# Patient Record
Sex: Female | Born: 1960 | Race: White | Hispanic: No | Marital: Married | Smoking: Never smoker
Health system: Southern US, Community
[De-identification: ages and names within clinical notes are randomized; demographics above are authoritative.]

## PROBLEM LIST (undated history)

## (undated) DIAGNOSIS — G4761 Periodic limb movement disorder: Secondary | ICD-10-CM

## (undated) DIAGNOSIS — S82892A Other fracture of left lower leg, initial encounter for closed fracture: Secondary | ICD-10-CM

## (undated) DIAGNOSIS — S82899A Other fracture of unspecified lower leg, initial encounter for closed fracture: Secondary | ICD-10-CM

## (undated) DIAGNOSIS — G43909 Migraine, unspecified, not intractable, without status migrainosus: Secondary | ICD-10-CM

## (undated) DIAGNOSIS — I7300 Raynaud's syndrome without gangrene: Secondary | ICD-10-CM

## (undated) DIAGNOSIS — D841 Defects in the complement system: Secondary | ICD-10-CM

## (undated) DIAGNOSIS — H409 Unspecified glaucoma: Secondary | ICD-10-CM

## (undated) DIAGNOSIS — D8989 Other specified disorders involving the immune mechanism, not elsewhere classified: Secondary | ICD-10-CM

## (undated) DIAGNOSIS — E538 Deficiency of other specified B group vitamins: Secondary | ICD-10-CM

## (undated) DIAGNOSIS — G629 Polyneuropathy, unspecified: Secondary | ICD-10-CM

## (undated) HISTORY — DX: Raynaud's syndrome without gangrene: I73.00

## (undated) HISTORY — DX: Migraine, unspecified, not intractable, without status migrainosus: G43.909

## (undated) HISTORY — DX: Other specified disorders involving the immune mechanism, not elsewhere classified: D89.89

## (undated) HISTORY — PX: KNEE SURGERY: SHX244

## (undated) HISTORY — DX: Periodic limb movement disorder: G47.61

## (undated) HISTORY — PX: SURAL NERVE BIOPSY: SUR153

## (undated) HISTORY — DX: Other fracture of unspecified lower leg, initial encounter for closed fracture: S82.899A

## (undated) HISTORY — DX: Deficiency of other specified B group vitamins: E53.8

## (undated) HISTORY — DX: Polyneuropathy, unspecified: G62.9

## (undated) HISTORY — PX: OTHER SURGICAL HISTORY: SHX169

## (undated) HISTORY — DX: Unspecified glaucoma: H40.9

## (undated) HISTORY — DX: Other fracture of left lower leg, initial encounter for closed fracture: S82.892A

## (undated) HISTORY — DX: Defects in the complement system: D84.1

---

## 1999-06-23 ENCOUNTER — Other Ambulatory Visit: Admission: RE | Admit: 1999-06-23 | Discharge: 1999-06-23 | Payer: Self-pay | Admitting: Gynecology

## 2000-07-05 ENCOUNTER — Other Ambulatory Visit: Admission: RE | Admit: 2000-07-05 | Discharge: 2000-07-05 | Payer: Self-pay | Admitting: Gynecology

## 2001-08-25 ENCOUNTER — Other Ambulatory Visit: Admission: RE | Admit: 2001-08-25 | Discharge: 2001-08-25 | Payer: Self-pay | Admitting: Gynecology

## 2002-07-29 ENCOUNTER — Ambulatory Visit (HOSPITAL_COMMUNITY): Admission: RE | Admit: 2002-07-29 | Discharge: 2002-07-29 | Payer: Self-pay | Admitting: Specialist

## 2002-08-25 ENCOUNTER — Other Ambulatory Visit: Admission: RE | Admit: 2002-08-25 | Discharge: 2002-08-25 | Payer: Self-pay | Admitting: Gynecology

## 2002-09-11 ENCOUNTER — Other Ambulatory Visit: Admission: RE | Admit: 2002-09-11 | Discharge: 2002-09-11 | Payer: Self-pay | Admitting: Gynecology

## 2003-02-26 ENCOUNTER — Other Ambulatory Visit: Admission: RE | Admit: 2003-02-26 | Discharge: 2003-02-26 | Payer: Self-pay | Admitting: Gynecology

## 2003-10-01 ENCOUNTER — Other Ambulatory Visit: Admission: RE | Admit: 2003-10-01 | Discharge: 2003-10-01 | Payer: Self-pay | Admitting: Gynecology

## 2004-04-26 ENCOUNTER — Other Ambulatory Visit: Admission: RE | Admit: 2004-04-26 | Discharge: 2004-04-26 | Payer: Self-pay | Admitting: Gynecology

## 2004-11-01 ENCOUNTER — Other Ambulatory Visit: Admission: RE | Admit: 2004-11-01 | Discharge: 2004-11-01 | Payer: Self-pay | Admitting: Gynecology

## 2005-05-15 ENCOUNTER — Other Ambulatory Visit: Admission: RE | Admit: 2005-05-15 | Discharge: 2005-05-15 | Payer: Self-pay | Admitting: Gynecology

## 2005-10-25 ENCOUNTER — Other Ambulatory Visit: Admission: RE | Admit: 2005-10-25 | Discharge: 2005-10-25 | Payer: Self-pay | Admitting: Gynecology

## 2006-11-04 ENCOUNTER — Other Ambulatory Visit: Admission: RE | Admit: 2006-11-04 | Discharge: 2006-11-04 | Payer: Self-pay | Admitting: Gynecology

## 2008-11-03 ENCOUNTER — Ambulatory Visit (HOSPITAL_COMMUNITY): Admission: RE | Admit: 2008-11-03 | Discharge: 2008-11-03 | Payer: Self-pay | Admitting: Neurosurgery

## 2008-11-03 ENCOUNTER — Encounter (INDEPENDENT_AMBULATORY_CARE_PROVIDER_SITE_OTHER): Payer: Self-pay | Admitting: Neurosurgery

## 2008-11-17 ENCOUNTER — Ambulatory Visit (HOSPITAL_COMMUNITY): Admission: RE | Admit: 2008-11-17 | Discharge: 2008-11-17 | Payer: Self-pay | Admitting: Neurosurgery

## 2008-11-17 ENCOUNTER — Ambulatory Visit: Payer: Self-pay | Admitting: Vascular Surgery

## 2008-11-17 ENCOUNTER — Encounter (INDEPENDENT_AMBULATORY_CARE_PROVIDER_SITE_OTHER): Payer: Self-pay | Admitting: Neurosurgery

## 2008-12-13 ENCOUNTER — Encounter: Admission: RE | Admit: 2008-12-13 | Discharge: 2008-12-13 | Payer: Self-pay | Admitting: Neurology

## 2009-02-25 ENCOUNTER — Ambulatory Visit: Payer: Self-pay | Admitting: Vascular Surgery

## 2009-04-23 IMAGING — CR DG CHEST 2V
3 series · 3 of 3 positions shown · non-contrast
Comparison: None.

CLINICAL DATA: Vasculitis.  Idiopathic peripheral neuropathy.

CHEST - 2 VIEW

[view not recorded (1 of 3)]
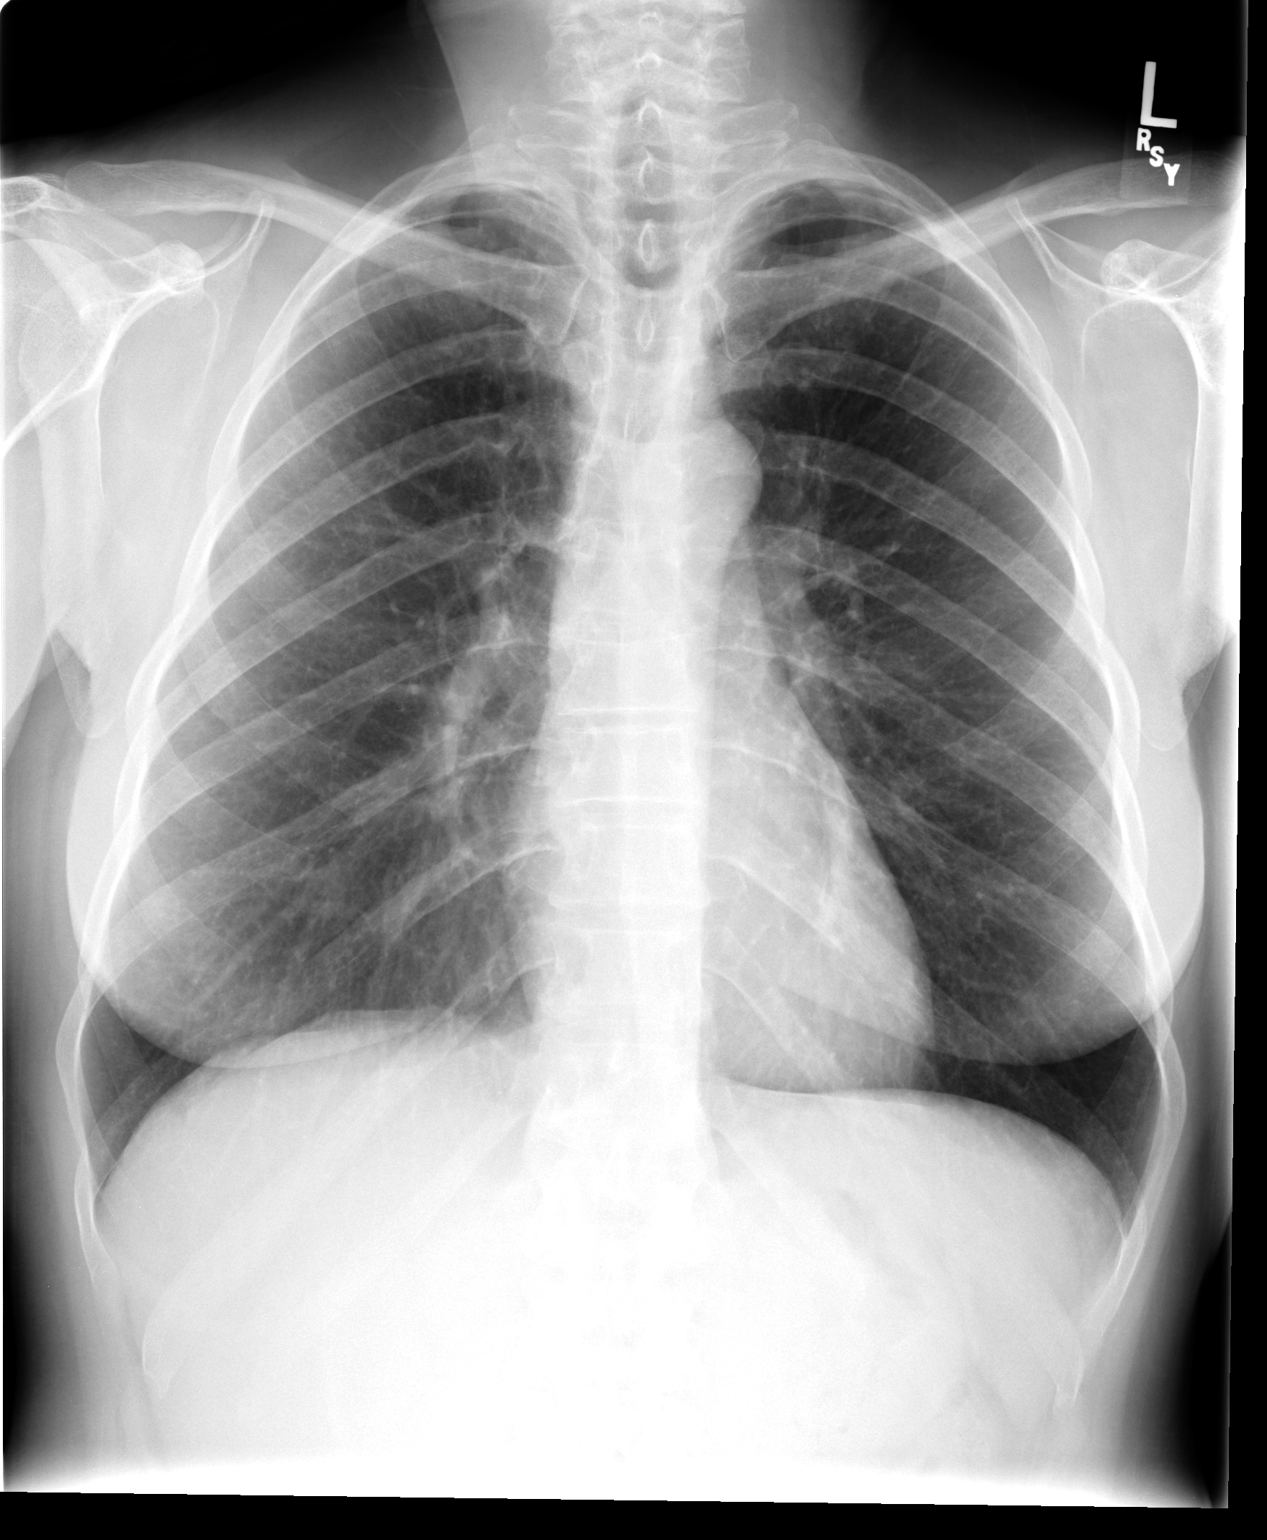

[view not recorded (2 of 3)]
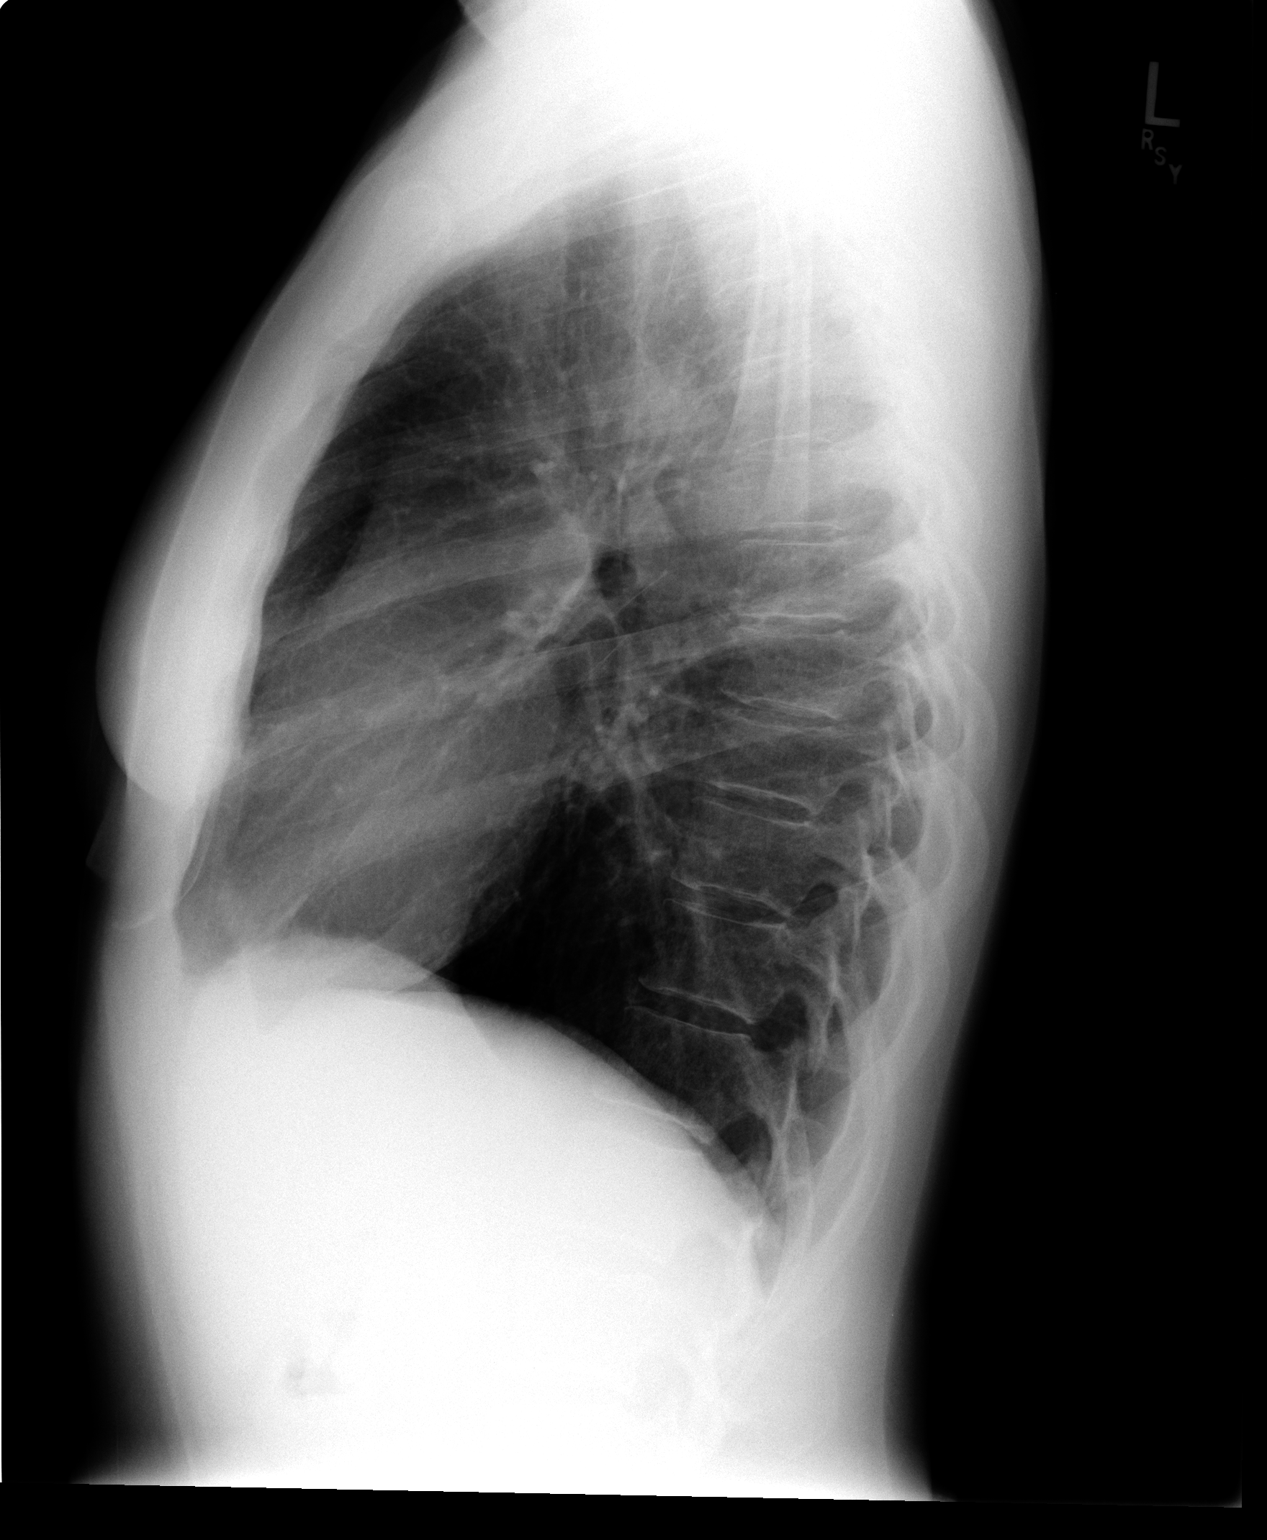

[view not recorded (3 of 3)]
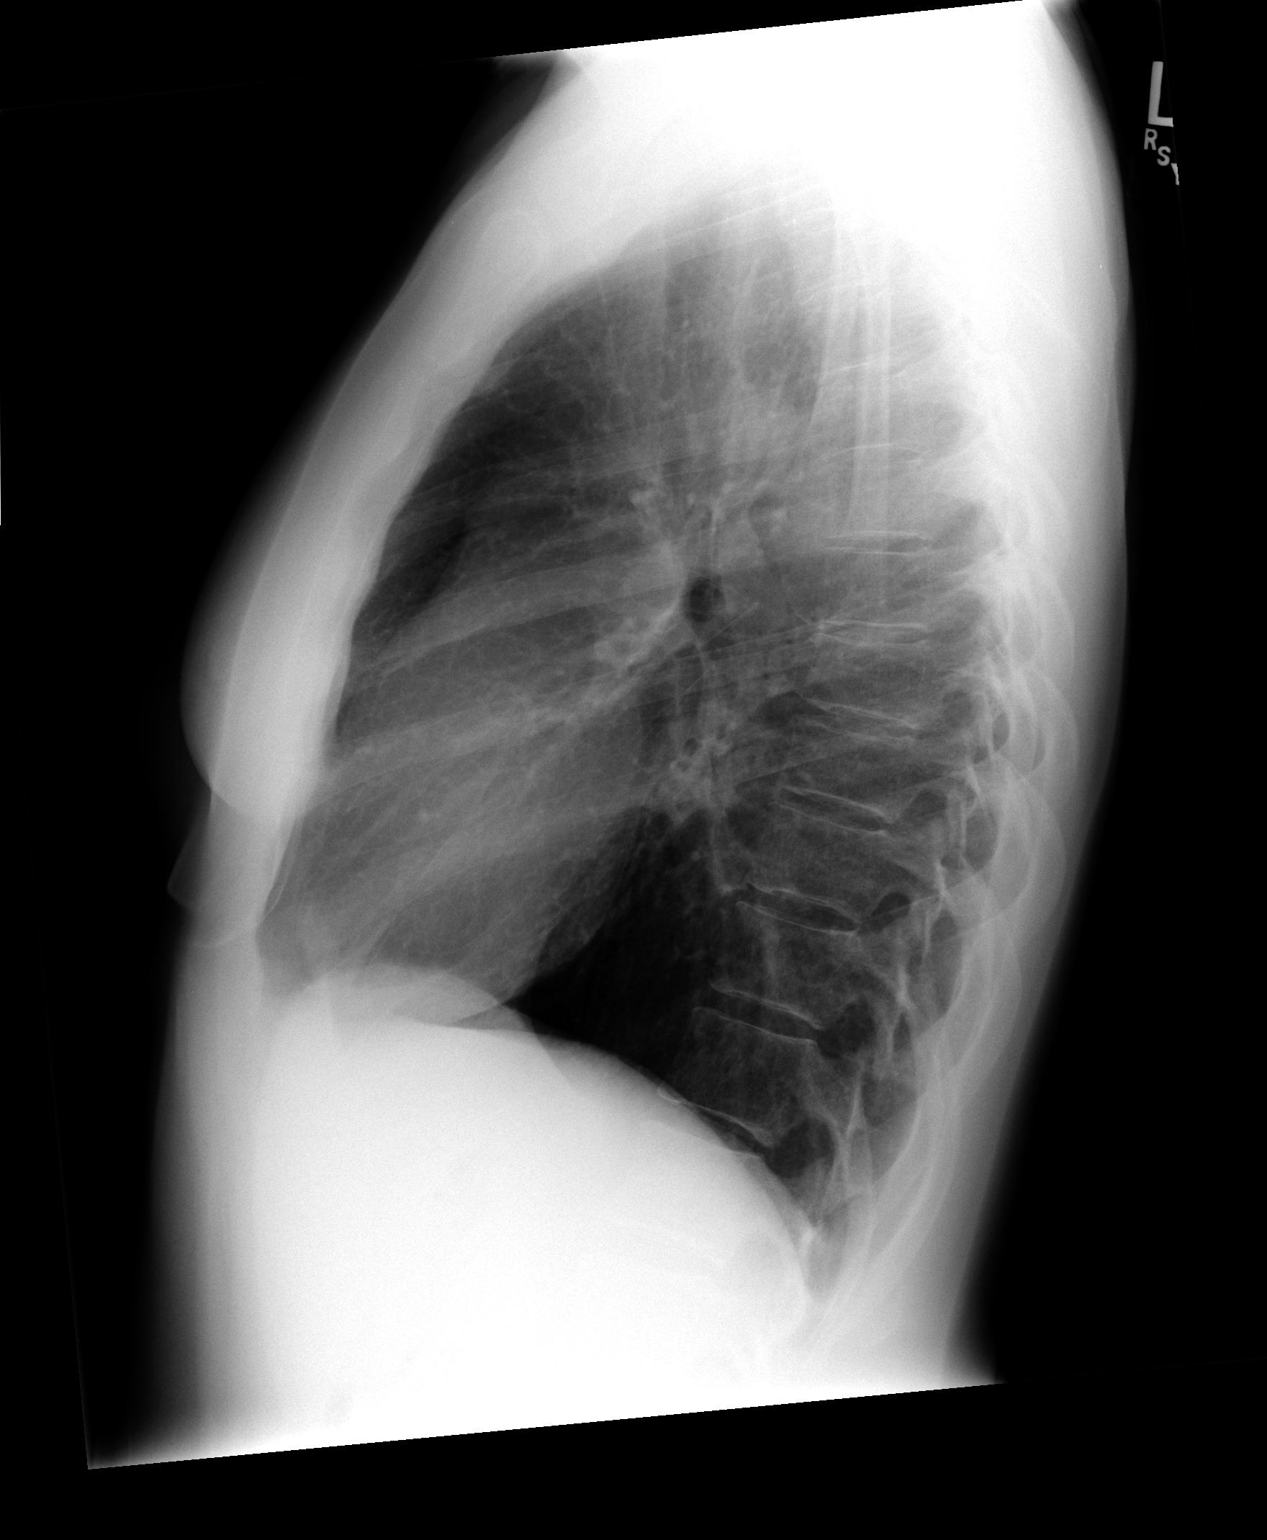

[3 of 3 positions shown; findings below may reference images not displayed]

FINDINGS: The lungs are clear without focal consolidation, edema,
effusion or pneumothorax.  Cardiopericardial silhouette is within
normal limits for size.  Imaged bony structures of the thorax are
intact.
IMPRESSION: No acute cardiopulmonary process.

## 2010-09-15 ENCOUNTER — Encounter: Payer: Self-pay | Admitting: Internal Medicine

## 2010-10-05 NOTE — Letter (Signed)
Summary: Annual Exam-Dr. Beather Arbour  Annual Exam-Dr. Beather Arbour   Imported By: Maryln Gottron 09/28/2010 10:49:33  _____________________________________________________________________  External Attachment:    Type:   Image     Comment:   External Document

## 2010-12-19 LAB — BASIC METABOLIC PANEL
BUN: 12 mg/dL (ref 6–23)
CO2: 26 mEq/L (ref 19–32)
Calcium: 9.1 mg/dL (ref 8.4–10.5)
Chloride: 108 mEq/L (ref 96–112)
Creatinine, Ser: 0.7 mg/dL (ref 0.4–1.2)
GFR calc Af Amer: >60 mL/min (ref >60)
GFR calc non Af Amer: >60 mL/min (ref >60)
Glucose, Bld: 98 mg/dL (ref 70–99)
Potassium: 4.5 mEq/L (ref 3.5–5.1)
Sodium: 137 mEq/L (ref 135–145)

## 2010-12-19 LAB — CBC
HCT: 39.6 % (ref 36.0–46.0)
Hemoglobin: 13.5 g/dL (ref 12.0–15.0)
MCHC: 34.1 g/dL (ref 30.0–36.0)
MCV: 91.6 fL (ref 78.0–100.0)
Platelets: 332 10*3/uL (ref 150–400)
RBC: 4.32 MIL/uL (ref 3.87–5.11)
RDW: 12.8 % (ref 11.5–15.5)
WBC: 8.1 10*3/uL (ref 4.0–10.5)

## 2011-01-16 NOTE — Op Note (Signed)
[name], [name]               ACCOUNT NO.:  [id number]   MEDICAL RECORD NO.:  [id number]          PATIENT TYPE:  OIB   LOCATION:  3523                         FACILITY:  [facility]   PHYSICIAN:  Hewitt Shorts, M.D.DATE OF BIRTH:  16-Jan-1961   DATE OF PROCEDURE:  11/03/2008  DATE OF DISCHARGE:  11/03/2008                               OPERATIVE REPORT   PREOPERATIVE DIAGNOSIS:  Peripheral neuropathy.   POSTOPERATIVE DIAGNOSIS:  Peripheral neuropathy.   PROCEDURE:  1. Left sural nerve biopsy.  2. Left gastrocnemius muscle biopsy.   SURGEON:  Hewitt Shorts, MD   ANESTHESIA:  General endotracheal.   INDICATIONS:  The patient is a 50 year old woman who has been undergoing  a workup for peripheral neuropathy of unknown etiology.  She has  undergone an extensive workup with Dr. Avie Echevaria who has requested a  nerve and muscle biopsy.  The tissue specimens are to be forwarded to  the Pathology lab here at [facility], to be then forwarded on  to the Nerve and Muscle Pathology lab at [facility].   PROCEDURE:  The patient was brought to the operating room and placed  under general endotracheal anesthesia.  The patient was turned to a  prone position.  The left lower extremity from the distal thigh, distal  end of the foot was prepped with Betadine soap and solution, and draped  in a sterile fashion.  We first performed the left sural nerve biopsy  making an approximately 6-cm incision on the posterolateral aspect of  the distal left leg between the lateral malleolus and Achilles tendon.  The line was infiltrated with local anesthetic with epinephrine.  Dissection was carried down through the subcutaneous tissue.  We  identified the short saphenous vein, and began to open the subcutaneous  and connective tissues and identified the sural nerve and began to free  the sural nerve up from its small branches and a 5-cm length specimen  was requested and  therefore, we dissected out approximately 5 cm of the  nerve.  Once it was freed up, we incised the nerve distally with an 11  scalpel and then subsequently we incised the nerve proximally.  The  nerve was wrapped in a saline-soaked gauze sponge, placed in a specimen  cup and the specimen was hand carried to the Pathology Laboratory for  further processing.  The wound was irrigated with bacitracin solution,  good hemostasis was maintained throughout the procedure with bipolar  cautery and then we proceeded with closure.  The subcutaneous and  subcuticular were closed with interrupted inverted 3-0 undyed Vicryl  sutures.  The skin was closed with Dermabond.   We then proceeded with the left gastrocnemius muscle biopsy.  Again the  overlying skin and subcutaneous tissue were infiltrated with local  anesthetic with epinephrine and incision was made, carried down through  the subcutaneous tissue.  Dissection was carried down to the fascia  overlying the muscle and then the fascia was carefully opened to avoid  the neurovascular structures.  We were able to expose the muscle itself  and again a  2.5-cm length of muscle was requested, we dissected  approximately 3 cm of muscle, and they requested strips of muscle  approximately 0.5 cm in diameter and these were carefully dissected from  the gastrocnemius muscle, they were sutured to a tongue blade with 0  silk ties, 3 specimens were obtained.  Hemostasis was maintained with  bipolar cautery.  The muscle spasms were each individually wrapped in a  saline-soaked gauze sponge.  All 3 were placed in the same specimen cup  and sent to Pathology for further processing.  The wound was checked for  hemostasis, which was established with bipolar cautery.  The fascia  overlying the muscle was closed with interrupted 2-0 undyed Vicryl  sutures and the subcutaneous and subcuticular were closed with  interrupted inverted 3-0 undyed Vicryl sutures.  Again the  skin was  closed with Dermabond.  The procedure was tolerated well and following  the procedure, the patient is to be turned back to the supine position,  reversing the anesthetic, extubated, and transferred to the recovery  room for further care.      Hewitt Shorts, M.D.  Electronically Signed     RWN/MEDQ  D:  11/03/2008  T:  11/03/2008  Job:  [id number]

## 2011-01-16 NOTE — Procedures (Signed)
DUPLEX DEEP VENOUS EXAM - LOWER EXTREMITY   INDICATION:  Right leg edema 10 days.   HISTORY:  Edema:  Right leg and foot.  Trauma/Surgery:  No.  Pain:  Chronic bilateral foot pain.  PE:  No.  Previous DVT:  Questionable history of DVT.  Anticoagulants:  No.  Other:  Bilateral varicose veins.   DUPLEX EXAM:                CFV   SFV   PopV  PTV    GSV                R  L  R  L  R  L  R   L  R  L  Thrombosis    0  0  0     0     0      0  Spontaneous   0  +  +     +     +      +  Phasic        +  +  +     +     +      +  Augmentation  +  +  +     +     +      +  Compressible  +  +  +     +     +      +  Competent     0  0  +     +     +      +   Legend:  + - yes  o - no  p - partial  D - decreased    IMPRESSION:  1. No evidence of right leg DVT or Baker's cyst.  2. Left common femoral vein demonstrates incompetence.  3. 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  The      right short saphenous vein is partially compressible.         _____________________________  Quita Skye. Hart Rochester, M.D.   MC/MEDQ  D:  02/25/2009  T:  02/25/2009  Job:  [id number]   cc:   Genene Churn. Love, M.D.  Lurena Nida, MD

## 2011-01-19 NOTE — Op Note (Signed)
NAMEKAYSIE, [name]                         ACCOUNT NO.:  [id number]   MEDICAL RECORD NO.:  [id number]                   PATIENT TYPE:  AMB   LOCATION:  DAY                                  FACILITY:  [facility]   PHYSICIAN:  Jene Every, M.D.                 DATE OF BIRTH:  1961/03/16   DATE OF PROCEDURE:  DATE OF DISCHARGE:                                 OPERATIVE REPORT   PREOPERATIVE DIAGNOSIS:  Medial meniscus tear of the left knee.   POSTOPERATIVE DIAGNOSES:  1. Medial meniscus tear of the left knee.  2. 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   PROCEDURE:  Left knee arthroscopy, partial medial meniscectomy,  chondroplasty of the medial femoral condyle and tibial plateau.   BRIEF HISTORY AND INDICATIONS:  A 50 year old with mechanical locking and  popping of the left knee, MRI indicating displaced complex tear of the  posterior horn of the medial meniscus.  Operative intervention was indicated  for partial resection.  Risks have been discussed, including bleeding,  infection, damage to vascular structures, no change in symptoms, need for  repeat debridement.   DESCRIPTION OF PROCEDURE:  Patient in supine position.  After induction of  adequate general anesthesia and 1 g Kefzol, the left lower extremity was  prepped and draped in the usual sterile fashion.  A lateral parapatellar  portal and superior medial parapatellar portal were fashioned with a #11  blade, the ingress cannula atraumatically placed.  The cannula was utilized  to insufflate the joint.  Under direct visualization, a medial parapatellar  portal was fashioned with a #11 blade after localization with the 18-gauge  needle, sparing the medial meniscus.  Inspection of the suprapatellar pouch  reveals minor grade 3 changes of the inferior pole of the patella, normal  patellofemoral tracking.  The shaver was introduced and utilized to perform  a chondroplasty of the  flap tear here.  Examination of the medial  compartment revealed a complex tear of the posterior horn of the medial  meniscus, horizontal and vertical components, extending from the posterior  horn to the anterior medial aspect of the meniscus.  This was unstable to  probe palpation.  Therefore, I introduced a combination of the straight and  angled basket rongeurs to resect it to a stable base, which was then further  contoured by a shaver.  This actually had torn back to the vascular  junction; therefore, electrocautery was introduced and utilized to achieve  hemostasis at this point.  Reduced pressure of the tourniquet, and there was  no significant active bleeding.  I performed chondroplasty of the medial  femoral condyle and tibial plateau that was seen on the MRI.  The remainder  of the meniscus was stable to probe palpation, but at least 70% of that  posterior aspect up to the anteromedial aspect had  to be resected.  The  remainder was stable to probe palpation.  The ACL and PCL were unremarkable.  The lateral compartment revealed normal femoral condyle, tibial plateau, and  meniscus, which was stable to probe palpation and no evidence of a tear  here.  We copiously lavaged all compartments.  The gutters were examined,  and they were unremarkable.  We probed the meniscus, and no residual  unstable fragment was noted.  Lavaged the knee.  No evidence of active  bleeding.  Removed the instrumentation, closed with portals of 4-0 nylon  simple suture, 0.25% Marcaine with epinephrine was  infiltrated into the joint, and the wounds were dressed sterilely and the  patient was awoken without difficulty, transported to the recovery room in  satisfactory condition.   The patient tolerated the procedure well with no complication.                                               Jene Every, M.D.    Cordelia Pen  D:  07/29/2002  T:  07/29/2002  Job:  [id number]

## 2011-10-15 ENCOUNTER — Other Ambulatory Visit: Payer: Self-pay | Admitting: Gynecology

## 2011-11-02 DIAGNOSIS — I7300 Raynaud's syndrome without gangrene: Secondary | ICD-10-CM | POA: Insufficient documentation

## 2011-11-02 DIAGNOSIS — G608 Other hereditary and idiopathic neuropathies: Secondary | ICD-10-CM | POA: Insufficient documentation

## 2012-10-03 DIAGNOSIS — E538 Deficiency of other specified B group vitamins: Secondary | ICD-10-CM | POA: Insufficient documentation

## 2012-10-03 DIAGNOSIS — R209 Unspecified disturbances of skin sensation: Secondary | ICD-10-CM | POA: Insufficient documentation

## 2012-10-03 DIAGNOSIS — H9319 Tinnitus, unspecified ear: Secondary | ICD-10-CM | POA: Insufficient documentation

## 2012-10-03 DIAGNOSIS — H16229 Keratoconjunctivitis sicca, not specified as Sjogren's, unspecified eye: Secondary | ICD-10-CM | POA: Insufficient documentation

## 2012-10-03 DIAGNOSIS — G609 Hereditary and idiopathic neuropathy, unspecified: Secondary | ICD-10-CM | POA: Insufficient documentation

## 2012-10-17 ENCOUNTER — Other Ambulatory Visit: Payer: Self-pay | Admitting: Gynecology

## 2012-11-05 ENCOUNTER — Other Ambulatory Visit: Payer: Self-pay | Admitting: Gynecology

## 2012-11-05 DIAGNOSIS — Z803 Family history of malignant neoplasm of breast: Secondary | ICD-10-CM

## 2012-11-05 DIAGNOSIS — E2839 Other primary ovarian failure: Secondary | ICD-10-CM

## 2012-11-05 DIAGNOSIS — Z8781 Personal history of (healed) traumatic fracture: Secondary | ICD-10-CM

## 2012-11-05 DIAGNOSIS — Z1231 Encounter for screening mammogram for malignant neoplasm of breast: Secondary | ICD-10-CM

## 2012-12-12 ENCOUNTER — Ambulatory Visit: Payer: Self-pay

## 2012-12-12 ENCOUNTER — Other Ambulatory Visit: Payer: Self-pay

## 2012-12-25 ENCOUNTER — Other Ambulatory Visit: Payer: Self-pay | Admitting: Gynecology

## 2012-12-25 DIAGNOSIS — Z8781 Personal history of (healed) traumatic fracture: Secondary | ICD-10-CM

## 2012-12-25 DIAGNOSIS — Z1231 Encounter for screening mammogram for malignant neoplasm of breast: Secondary | ICD-10-CM

## 2012-12-25 DIAGNOSIS — E2839 Other primary ovarian failure: Secondary | ICD-10-CM

## 2012-12-25 DIAGNOSIS — Z803 Family history of malignant neoplasm of breast: Secondary | ICD-10-CM

## 2012-12-26 ENCOUNTER — Other Ambulatory Visit: Payer: Self-pay

## 2012-12-26 MED ORDER — DULOXETINE HCL 60 MG PO CPEP: 60.0000 mg | ORAL_CAPSULE | Freq: Every day | ORAL | Status: DC

## 2012-12-26 NOTE — Telephone Encounter (Signed)
Former [facility] Patient assigned to Dr Anne Hahn.

## 2013-01-02 ENCOUNTER — Ambulatory Visit
Admission: RE | Admit: 2013-01-02 | Discharge: 2013-01-02 | Disposition: A | Discharge status: Home / Self Care | Payer: 59 | Source: Ambulatory Visit | Attending: Gynecology | Admitting: Gynecology

## 2013-01-02 ENCOUNTER — Other Ambulatory Visit: Payer: Self-pay | Admitting: Obstetrics and Gynecology

## 2013-01-02 ENCOUNTER — Ambulatory Visit: Payer: Self-pay

## 2013-01-02 DIAGNOSIS — Z1231 Encounter for screening mammogram for malignant neoplasm of breast: Secondary | ICD-10-CM

## 2013-01-02 DIAGNOSIS — E2839 Other primary ovarian failure: Secondary | ICD-10-CM

## 2013-01-02 DIAGNOSIS — Z8781 Personal history of (healed) traumatic fracture: Secondary | ICD-10-CM

## 2013-01-02 DIAGNOSIS — Z803 Family history of malignant neoplasm of breast: Secondary | ICD-10-CM

## 2013-01-18 ENCOUNTER — Other Ambulatory Visit: Payer: Self-pay

## 2013-01-18 MED ORDER — GABAPENTIN 300 MG PO CAPS: 300.0000 mg | ORAL_CAPSULE | Freq: Three times a day (TID) | ORAL | Status: DC

## 2013-02-18 ENCOUNTER — Telehealth: Payer: Self-pay

## 2013-02-18 NOTE — Telephone Encounter (Signed)
I called patient at work as requested. Their office is closed. I called her at home and was instructed by a female who answered the phone to call back later.

## 2013-02-18 NOTE — Telephone Encounter (Signed)
Message copied by [facility] on Wed Feb 18, 2013  3:54 PM ------      Message from: Richrd Prime      Created: Tue Feb 17, 2013 11:52 AM      Regarding: B12 Injections FDA states nationwide shortage      Contact: [phone]       Patient is calling to tell us she spoke to her Pharmacy.  They told her there is a FDA announced shortage of B12 injectables.  She would like a call back to discuss.  She is Dr. Clarisa Kindred patient.  She can be called at work today [phone] ------

## 2013-04-24 ENCOUNTER — Encounter: Payer: Self-pay | Admitting: Neurology

## 2013-04-24 ENCOUNTER — Other Ambulatory Visit: Payer: Self-pay | Admitting: Neurology

## 2013-04-24 ENCOUNTER — Ambulatory Visit (INDEPENDENT_AMBULATORY_CARE_PROVIDER_SITE_OTHER): Payer: 59 | Admitting: Neurology

## 2013-04-24 VITALS — BP 127/89 | HR 80 | Ht 65.0 in | Wt 180.0 lb

## 2013-04-24 DIAGNOSIS — G609 Hereditary and idiopathic neuropathy, unspecified: Secondary | ICD-10-CM

## 2013-04-24 DIAGNOSIS — I7300 Raynaud's syndrome without gangrene: Secondary | ICD-10-CM

## 2013-04-24 DIAGNOSIS — G43009 Migraine without aura, not intractable, without status migrainosus: Secondary | ICD-10-CM

## 2013-04-24 DIAGNOSIS — R209 Unspecified disturbances of skin sensation: Secondary | ICD-10-CM

## 2013-04-24 DIAGNOSIS — H9319 Tinnitus, unspecified ear: Secondary | ICD-10-CM

## 2013-04-24 DIAGNOSIS — Z5181 Encounter for therapeutic drug level monitoring: Secondary | ICD-10-CM

## 2013-04-24 DIAGNOSIS — E538 Deficiency of other specified B group vitamins: Secondary | ICD-10-CM

## 2013-04-24 HISTORY — DX: Raynaud's syndrome without gangrene: I73.00

## 2013-04-24 NOTE — Progress Notes (Signed)
Reason for visit: Peripheral neuropathy  [name] is an 52 y.o. female  History of present illness:  [name] is a 52 year old right-handed white female with a history of a peripheral neuropathy with onset of symptoms around 2008. The patient was felt to have vasculitis, possibly associated with Sjogren's syndrome. The patient is followed by Dr. Nickola Major as well. The patient has been on Cymbalta and gabapentin without complete pain relief. The patient is able to cope with the pain during the day, but she has significant issues at nighttime. The patient has had a muscle biopsy previously that shows evidence of a vasculitis. The patient has Raynaulds phenomenon. The patient has a low B12 level, and she is getting monthly injections. The patient returns to this office for further evaluation. The patient denies any significant balance issues. The patient has a sensation in the feet as if something is squeezing her feet and toes.  Past Medical History  Diagnosis Date  . Migraines   . Myeloperoxidase deficiency   . Raynaud disease 04/24/2013  . Vitamin B12 deficiency   . Ankle fracture   . Peripheral neuropathy     Right    Past Surgical History  Procedure Laterality Date  . Dental implants      x3  . Knee surgery Left   . Sural nerve biopsy    . Gastrocsoleus biopsy    . Lip biopsy      Family History  Problem Relation Age of Onset  . Migraines Mother   . Cancer Mother   . Aneurysm Father   . Parkinsonism Maternal Grandfather   . Cancer Maternal Grandfather   . Heart disease Maternal Grandfather   . Heart disease Paternal Grandfather   . Stroke Paternal Grandfather     Social history:  reports that she has never smoked. She does not have any smokeless tobacco history on file. She reports that she does not drink alcohol or use illicit drugs.    Allergies  Allergen Reactions  . Doxycycline Hyclate   . Lyrica [Pregabalin]   . Vivelle-Dot [Estradiol]   . Ibuprofen      Medications:  Current Outpatient Prescriptions on File Prior to Visit  Medication Sig Dispense Refill  . DULoxetine (CYMBALTA) 60 MG capsule Take 1 capsule (60 mg total) by mouth daily.  30 capsule  6  . gabapentin (NEURONTIN) 300 MG capsule Take 1 capsule (300 mg total) by mouth 3 (three) times daily.  90 capsule  5   No current facility-administered medications on file prior to visit.    ROS:  Out of a complete 14 system review of symptoms, the patient complains only of the following symptoms, and all other reviewed systems are negative.  Weight gain, fatigue Swelling in the legs Ringing in the ears Cough Easy bruising Increased thirst Numbness Insomnia, restless legs  Blood pressure 127/89, pulse 80, height 5\' 5"  (1.651 m), weight 180 lb (81.647 kg).  Physical Exam  General: The patient is alert and cooperative at the time of the examination.  Skin:  1-2+ edema on the right ankle is noted, 1+ edema on the left.   Neurologic Exam  Cranial nerves: Facial symmetry is present. Speech is normal, no aphasia or dysarthria is noted. Extraocular movements are full. Visual fields are full.  Motor: The patient has good strength in all 4 extremities.  Coordination: The patient has good finger-nose-finger and heel-to-shin bilaterally.  Gait and station: The patient has a normal gait. Tandem gait is slightly  and study. Romberg is negative. No drift is seen.  Reflexes: Deep tendon reflexes are symmetric. Reflexes in the lower extremities are somewhat brisk, and there is a slightly more prominent reflex on the right ankle jerk than in the left.   Assessment/Plan:  1. Peripheral neuropathy  2. Vitamin B12 deficiency  The patient will be given a trial on Gralise to see if the extended-release preparation of gabapentin may help improve her symptoms at night. The patient was given samples, and if she finds that this is helpful, she will contact our office. In the future, the  Cymbalta can also be increased, but the patient indicates that if she takes it at night, this causes vivid dreams, and she is unable to rest well. The patient otherwise will followup in 6 months. Blood work will be done today. The patient reports a recent increase in weight gain, and a dry hacking cough.  Marlan Palau MD 04/25/2013 8:49 AM  [facility] [address] [place], [place] [address]  Phone [phone] Fax [phone]

## 2013-05-01 ENCOUNTER — Encounter: Payer: Self-pay | Admitting: Neurology

## 2013-05-05 ENCOUNTER — Encounter: Payer: Self-pay | Admitting: Neurology

## 2013-05-05 LAB — TSH: TSH: 10.76 u[IU]/mL — ABNORMAL HIGH (ref 0.450–4.500)

## 2013-05-05 LAB — CBC WITH DIFFERENTIAL
Basophils Absolute: 0.1 10*3/uL (ref 0.0–0.2)
Basos: 1 % (ref 0–3)
Eosinophils Absolute: 0.2 10*3/uL (ref 0.0–0.4)
Hemoglobin: 12.6 g/dL (ref 11.1–15.9)
Immature Grans (Abs): 0 10*3/uL (ref 0.0–0.1)
Lymphs: 28 % (ref 14–46)
MCHC: 32.8 g/dL (ref 31.5–35.7)
Monocytes: 11 % (ref 4–12)
Neutrophils Relative %: 57 % (ref 40–74)
RDW: 13.9 % (ref 12.3–15.4)
WBC: 7.6 10*3/uL (ref 3.4–10.8)

## 2013-05-05 LAB — COMPREHENSIVE METABOLIC PANEL
ALT: 22 IU/L (ref 0–32)
CO2: 25 mmol/L (ref 18–29)
Calcium: 9.3 mg/dL (ref 8.7–10.2)
Chloride: 106 mmol/L (ref 97–108)
Glucose: 94 mg/dL (ref 65–99)
Potassium: 4.5 mmol/L (ref 3.5–5.2)
Total Protein: 6.5 g/dL (ref 6.0–8.5)

## 2013-05-05 LAB — PLEASE NOTE

## 2013-05-07 ENCOUNTER — Telehealth: Payer: Self-pay | Admitting: Neurology

## 2013-05-07 ENCOUNTER — Telehealth: Payer: Self-pay | Admitting: *Deleted

## 2013-05-07 NOTE — Telephone Encounter (Signed)
error 

## 2013-05-07 NOTE — Telephone Encounter (Signed)
I called the patient and I talked with her husband. The blood work is unremarkable, with the exception that the TSH is around 10. This will need to be followed, and likely be treated in the future. I will send the blood work results to her primary care physician.

## 2013-05-07 NOTE — Telephone Encounter (Signed)
Went over Dr. Anne Hahn note and results with patient. She said her spouse did not get the full understanding the first time.

## 2013-05-07 NOTE — Telephone Encounter (Signed)
Dr. Anne Hahn gave pts husband results.

## 2013-05-12 ENCOUNTER — Encounter: Payer: Self-pay | Admitting: Neurology

## 2013-05-12 ENCOUNTER — Telehealth: Payer: Self-pay

## 2013-05-12 MED ORDER — GABAPENTIN (ONCE-DAILY) 600 MG PO TABS: 600.0000 mg | ORAL_TABLET | Freq: Every day | ORAL | Status: DC

## 2013-05-12 NOTE — Telephone Encounter (Signed)
I called the patient and I talked with the husband. The patient is on gabapentin taking 300 mg 3 times daily. It appears that she may want to stay on this as well as go on Gralise 600 mg in the evening. I will call in a Gralise. The patient overall is on a relatively low dose of gabapentin.

## 2013-05-12 NOTE — Telephone Encounter (Signed)
Patient sent an email to the clinic pool.  Says she has been able to function fairly well on just the 600mg  Gralise and supplementing it with 300mg  Gabapentin when her pain is severe.  Indicates she has plenty of Gabapentin, but would like a new Rx sent to [facility] for Gralise.  She would like to remain on both meds.  Please advise.  Thank you.

## 2013-05-18 ENCOUNTER — Telehealth: Payer: Self-pay | Admitting: Neurology

## 2013-05-18 NOTE — Telephone Encounter (Signed)
I have placed samples at the front desk.  Dispensed Gralise Starter Kit #1 Lot [id number] Exp 07/2015.  I called the patient.  She is aware samples are ready for pick up.

## 2013-05-28 ENCOUNTER — Other Ambulatory Visit: Payer: Self-pay

## 2013-05-28 ENCOUNTER — Telehealth: Payer: Self-pay

## 2013-05-28 MED ORDER — DULOXETINE HCL 60 MG PO CPEP: 60.0000 mg | ORAL_CAPSULE | Freq: Two times a day (BID) | ORAL | Status: DC

## 2013-05-28 MED ORDER — HYDROCODONE-ACETAMINOPHEN 5-300 MG PO TABS: 1.0000 | ORAL_TABLET | Freq: Four times a day (QID) | ORAL | Status: DC | PRN

## 2013-05-28 MED ORDER — GABAPENTIN 300 MG PO CAPS: 600.0000 mg | ORAL_CAPSULE | Freq: Every day | ORAL | Status: DC

## 2013-05-28 NOTE — Telephone Encounter (Signed)
Former [facility] patient.  He always prescribed #120 per fill.

## 2013-05-28 NOTE — Telephone Encounter (Signed)
Patient's insurance has mailed a correspondence stating they will not cover Gralise as it is excluded from the plans formulary.  The only alternative listed was regular Gabapentin, which the patient has already tried.  No exception will be granted unless her formulary changes in the future.   (There is not currently an assistance program for this drug.)

## 2013-05-28 NOTE — Telephone Encounter (Signed)
Rx signed and faxed.

## 2013-05-28 NOTE — Telephone Encounter (Signed)
I called the patient. The Gralise is not covered by her insurance. What we will do is go to Cymbalta 60 mg twice daily, and gabapentin 600 mg at night.

## 2013-06-05 ENCOUNTER — Telehealth: Payer: Self-pay

## 2013-06-05 MED ORDER — HYDROCODONE-ACETAMINOPHEN 5-325 MG PO TABS: 1.0000 | ORAL_TABLET | Freq: Four times a day (QID) | ORAL | Status: DC | PRN

## 2013-06-05 NOTE — Telephone Encounter (Signed)
I will switch the prescription on the hydrocodone. I called patient. The recent prescription for hydrocodone written on 05/28/2013 could not be filled due to insurance denial. I'll rewrite the prescription.

## 2013-06-05 NOTE — Telephone Encounter (Signed)
Patient called stating her insurance will no longer cover Hydrocodone 5/300mg  and she would like to know if her Rx can be changed to 5/325mg , because they will pay for that dose.  Please advise.  Thank you.

## 2013-06-17 ENCOUNTER — Telehealth: Payer: Self-pay

## 2013-06-17 NOTE — Telephone Encounter (Signed)
Patient called, left message saying Cymbalta needs a Prior Auth.  I have submitted all requested info to the ins, called patient, got no answer.  Left message.

## 2013-07-09 ENCOUNTER — Other Ambulatory Visit: Payer: Self-pay

## 2013-07-22 ENCOUNTER — Telehealth: Payer: Self-pay | Admitting: Neurology

## 2013-07-22 NOTE — Telephone Encounter (Signed)
Called for more information ,no answer, lt VM message

## 2013-07-22 NOTE — Telephone Encounter (Signed)
I called the patient. The patient has developed a rash on the chest and back, typical for an drug rash. The patient however, has been on gabapentin and Cymbalta for number of months, he would be unlikely for this to be an allergy to this medication. The patient will taper off of the Cymbalta, and if the rash does not go away, she will need to see a dermatologist. The patient reports no other new medications.

## 2013-11-13 ENCOUNTER — Encounter: Payer: Self-pay | Admitting: Neurology

## 2013-11-13 ENCOUNTER — Ambulatory Visit (INDEPENDENT_AMBULATORY_CARE_PROVIDER_SITE_OTHER): Payer: 59 | Admitting: Neurology

## 2013-11-13 VITALS — BP 124/83 | HR 70 | Wt 173.0 lb

## 2013-11-13 DIAGNOSIS — G609 Hereditary and idiopathic neuropathy, unspecified: Secondary | ICD-10-CM

## 2013-11-13 DIAGNOSIS — G43009 Migraine without aura, not intractable, without status migrainosus: Secondary | ICD-10-CM

## 2013-11-13 MED ORDER — HYDROCODONE-ACETAMINOPHEN 5-325 MG PO TABS: 1.0000 | ORAL_TABLET | Freq: Four times a day (QID) | ORAL | Status: DC | PRN

## 2013-11-13 MED ORDER — CYANOCOBALAMIN 1000 MCG/ML IJ SOLN: 1000.0000 ug | INTRAMUSCULAR | Status: DC

## 2013-11-13 NOTE — Patient Instructions (Signed)
Paresthesia °Paresthesia is an abnormal burning or prickling sensation. This sensation is generally felt in the hands, arms, legs, or feet. However, it may occur in any part of the body. It is usually not painful. The feeling may be described as: °· Tingling or numbness. °· "Pins and needles." °· Skin crawling. °· Buzzing. °· Limbs "falling asleep." °· Itching. °Most people experience temporary (transient) paresthesia at some time in their lives. °CAUSES  °Paresthesia may occur when you breathe too quickly (hyperventilation). It can also occur without any apparent cause. Commonly, paresthesia occurs when pressure is placed on a nerve. The feeling quickly goes away once the pressure is removed. For some people, however, paresthesia is a long-lasting (chronic) condition caused by an underlying disorder. The underlying disorder may be: °· A traumatic, direct injury to nerves. Examples include a: °· Broken (fractured) neck. °· Fractured skull. °· A disorder affecting the brain and spinal cord (central nervous system). Examples include: °· Transverse myelitis. °· Encephalitis. °· Transient ischemic attack. °· Multiple sclerosis. °· Stroke. °· Tumor or blood vessel problems, such as an arteriovenous malformation pressing against the brain or spinal cord. °· A condition that damages the peripheral nerves (peripheral neuropathy). Peripheral nerves are not part of the brain and spinal cord. These conditions include: °· Diabetes. °· Peripheral vascular disease. °· Nerve entrapment syndromes, such as carpal tunnel syndrome. °· Shingles. °· Hypothyroidism. °· Vitamin B12 deficiencies. °· Alcoholism. °· Heavy metal poisoning (lead, arsenic). °· Rheumatoid arthritis. °· Systemic lupus erythematosus. °DIAGNOSIS  °Your caregiver will attempt to find the underlying cause of your paresthesia. Your caregiver may: °· Take your medical history. °· Perform a physical exam. °· Order various lab tests. °· Order imaging tests. °TREATMENT    °Treatment for paresthesia depends on the underlying cause. °HOME CARE INSTRUCTIONS °· Avoid drinking alcohol. °· You may consider massage or acupuncture to help relieve your symptoms. °· Keep all follow-up appointments as directed by your caregiver. °SEEK IMMEDIATE MEDICAL CARE IF:  °· You feel weak. °· You have trouble walking or moving. °· You have problems with speech or vision. °· You feel confused. °· You cannot control your bladder or bowel movements. °· You feel numbness after an injury. °· You faint. °· Your burning or prickling feeling gets worse when walking. °· You have pain, cramps, or dizziness. °· You develop a rash. °MAKE SURE YOU: °· Understand these instructions. °· Will watch your condition. °· Will get help right away if you are not doing well or get worse. °Document Released: 08/10/2002 Document Revised: 11/12/2011 Document Reviewed: 05/11/2011 °[employer]® Patient Information ©2014 [employer]. ° °

## 2013-11-13 NOTE — Progress Notes (Signed)
Reason for visit: Peripheral neuropathy  [name] is an 53 y.o. 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 The patient has not responded to treatments for the Sjogren syndrome, and the peripheral neuropathy remains an issue for her. The patient has discomfort in the feet going half way up the legs. The patient denies any significant balance issues, but she does have discomfort throughout the day, worse in the evening hours when she is trying to sleep. The patient is on gabapentin and Cymbalta, but she continues to have pain issues. The patient fell out of bed several months ago and fractured her left ankle, but this did not require surgery. The patient otherwise indicates that she does not have problems with balance. The patient does have some tremors of the hands, and she notes some issues with slight numbness of the right hand. The patient has not had any other new medical issues that have come up since last seen. The patient developed a rash in the fall of 2014, but her dermatologist was not clear this was related to any of her medications. The patient indicates that she is doing quite well with her migraine headaches, and she has not had a headache in quite some time. The patient will take hydrocodone in the evening hours at times.  Past Medical History  Diagnosis Date  . Migraines   . Myeloperoxidase deficiency   . Raynaud disease 04/24/2013  . Vitamin B12 deficiency   . Ankle fracture   . Peripheral neuropathy     Right  . Ankle fracture, left     Past Surgical History  Procedure Laterality Date  . Dental implants      x3  . Knee surgery Left   . Sural nerve biopsy    . Gastrocsoleus biopsy    . Lip biopsy      Family History  Problem Relation Age of Onset  . Migraines Mother   . Cancer Mother   . Aneurysm Father   . Parkinsonism  Maternal Grandfather   . Cancer Maternal Grandfather   . Heart disease Maternal Grandfather   . Heart disease Paternal Grandfather   . Stroke Paternal Grandfather     Social history:  reports that she has never smoked. She has never used smokeless tobacco. She reports that she does not drink alcohol or use illicit drugs.    Allergies  Allergen Reactions  . Doxycycline Hyclate   . Lyrica [Pregabalin]   . Vivelle-Dot [Estradiol]   . Ibuprofen     Medications:  Current Outpatient Prescriptions on File Prior to Visit  Medication Sig Dispense Refill  . ALPRAZolam (XANAX) 0.25 MG tablet Take 0.25 mg by mouth at bedtime as needed for sleep.      Marland Kitchen aspirin-acetaminophen-caffeine (EXCEDRIN MIGRAINE) 250-250-65 MG per tablet Take 1 tablet by mouth daily. Take two(2) tablets daily      . Cholecalciferol (VITAMIN D-3) 5000 UNITS TABS Take 1 tablet by mouth every other day.      . DULoxetine (CYMBALTA) 60 MG capsule Take 1 capsule (60 mg total) by mouth 2 (two) times daily.  60 capsule  5  . gabapentin (NEURONTIN) 300 MG capsule Take 2 capsules (600 mg total) by mouth at bedtime.  90 capsule  5  . Pediatric Multivit-Minerals-C (MULTIVITAMIN GUMMIES CHILDRENS) CHEW Chew 1 tablet by mouth daily.      Marland Kitchen  VAGIFEM 10 MCG TABS vaginal tablet Place 10 mg vaginally every 30 (thirty) days. Take two(2) tablets monthly       No current facility-administered medications on file prior to visit.    ROS:  Out of a complete 14 system review of symptoms, the patient complains only of the following symptoms, and all other reviewed systems are negative.  Activity change, fatigue Ringing in the ears Leg swelling Restless legs, frequent waking, daytime sleepiness Bruising easily Numbness, weakness, tremors Decreased concentration  Blood pressure 124/83, pulse 70, weight 173 lb (78.472 kg).  Physical Exam  General: The patient is alert and cooperative at the time of the examination.  Skin: No  significant peripheral edema is noted.   Neurologic Exam  Mental status: The patient is oriented x 3.  Cranial nerves: Facial symmetry is present. Speech is normal, no aphasia or dysarthria is noted. Extraocular movements are full. Visual fields are full.  Motor: The patient has good strength in all 4 extremities.  Sensory examination: Soft touch sensation is decreased on the left foot as compared to the right, symmetric in the hands. There is a pinprick sensory deficit one half way up the legs bilaterally.  Coordination: The patient has good finger-nose-finger and heel-to-shin bilaterally.  Gait and station: The patient has a normal gait. Tandem gait is normal. Romberg is negative. No drift is seen.  Reflexes: Deep tendon reflexes are symmetric. Ankle jerk reflexes are maintained bilaterally.   Assessment/Plan:  1. Sjogren's syndrome  2. Peripheral neuropathy  3. Migraine headache  4. Vitamin B12 deficiency  The patient is still having some issues with her peripheral neuropathy. We will try a topical ointment for the neuropathy pain to see if this helps some of her symptoms. The patient is doing quite well for migraine headaches. The patient will followup in 6-8 months. A prescription was given for hydrocodone and a vitamin B12 today.  Marlan Palau. Keith Mariama Saintvil MD 11/13/2013 7:58 PM  [facility] [address] [place], [place] [id number]  Phone [phone] Fax [phone]

## 2013-11-16 ENCOUNTER — Telehealth: Payer: Self-pay | Admitting: Neurology

## 2013-11-16 NOTE — Telephone Encounter (Signed)
Pharmacy says they will contact the patient with co-pay info within 24 hours.

## 2013-11-16 NOTE — Telephone Encounter (Signed)
[facility]--they can ship Rx compound to patient but cannot file ins.--can Rx be sent to another pharmacy that does compounding and accepts patients ins.--thank you.

## 2013-11-16 NOTE — Telephone Encounter (Signed)
I called [facility] at [phone].  Spoke with the pharmacist.  She said they are not allow them to bill ins and mail the drug because they are not in the sate of [place].  I called an alternate pharmacy we use for compounding, [facility] at [phone].  Spoke with Viviann SpareSteven.  He asked that I fax them the form from other pharmacy.  Says they should be able to process without any issues, but they will contact us if they have any questions.  Form and ins has been faxed to them at [phone].

## 2014-01-17 ENCOUNTER — Other Ambulatory Visit: Payer: Self-pay

## 2014-01-17 MED ORDER — DULOXETINE HCL 60 MG PO CPEP: 60.0000 mg | ORAL_CAPSULE | Freq: Two times a day (BID) | ORAL | Status: DC

## 2014-04-02 ENCOUNTER — Other Ambulatory Visit: Payer: Self-pay | Admitting: Neurology

## 2014-04-02 MED ORDER — HYDROCODONE-ACETAMINOPHEN 5-325 MG PO TABS: 1.0000 | ORAL_TABLET | Freq: Four times a day (QID) | ORAL | Status: DC | PRN

## 2014-04-02 NOTE — Telephone Encounter (Signed)
Request forwarded to provider for approval  

## 2014-04-02 NOTE — Telephone Encounter (Signed)
Patient requesting refill of hydrocodone script to pick up, please call patient and advise.

## 2014-04-02 NOTE — Telephone Encounter (Signed)
Called pt and left message informing her that her Rx was ready to be picked up at the front desk and if she has any other problems, questions or concerns to call the office.  °

## 2014-05-21 ENCOUNTER — Ambulatory Visit (INDEPENDENT_AMBULATORY_CARE_PROVIDER_SITE_OTHER): Payer: 59 | Admitting: Neurology

## 2014-05-21 ENCOUNTER — Encounter: Payer: Self-pay | Admitting: Neurology

## 2014-05-21 VITALS — BP 118/86 | HR 91 | Wt 174.0 lb

## 2014-05-21 DIAGNOSIS — G43009 Migraine without aura, not intractable, without status migrainosus: Secondary | ICD-10-CM

## 2014-05-21 DIAGNOSIS — G609 Hereditary and idiopathic neuropathy, unspecified: Secondary | ICD-10-CM

## 2014-05-21 MED ORDER — CARBAMAZEPINE ER 100 MG PO CP12: 100.0000 mg | ORAL_CAPSULE | Freq: Two times a day (BID) | ORAL | Status: DC

## 2014-05-21 MED ORDER — HYDROCODONE-ACETAMINOPHEN 5-325 MG PO TABS: 1.0000 | ORAL_TABLET | Freq: Four times a day (QID) | ORAL | Status: DC | PRN

## 2014-05-21 NOTE — Progress Notes (Addendum)
Reason for visit: Peripheral neuropathy, headache  [name] is an 53 y.o. female  History of present illness:  [name] is a 53 year old right-handed white female with a history of Sjogren syndrome and an associated peripheral neuropathy. The patient has developed an iron deficiency anemia, and she will be evaluated for the source of the anemia. She has gone through menopause, and she is no longer having a menstrual cycle. The patient has suffered from significant fatigue. The patient has ongoing symptoms associated with her peripheral neuropathy. The topical ointment offered no benefit, and she stopped using it. The patient cut back on her Cymbalta taking 60 mg from 120 mg daily. The Cymbalta was resulting in some increased depression. The patient is also taking gabapentin 300 mg, 2 capsules at bedtime. She indicates that she does jerk and twitch at night, but she does not have definite restless leg syndrome. She is having more frequent headaches, averaging 1 a week. The patient will take Tylenol for the headache, and headaches do not prevent her from working. The patient returns to this office for an evaluation. She denies any balance issues. She has not had any falls. The patient has received a laceration on the right lower leg, and this occurred in June 2015, and she still has not healed completely.  Past Medical History  Diagnosis Date  . Migraines   . Myeloperoxidase deficiency   . Raynaud disease 04/24/2013  . Vitamin B12 deficiency   . Ankle fracture   . Peripheral neuropathy     Right  . Ankle fracture, left     Past Surgical History  Procedure Laterality Date  . Dental implants      x3  . Knee surgery Left   . Sural nerve biopsy    . Gastrocsoleus biopsy    . Lip biopsy      Family History  Problem Relation Age of Onset  . Migraines Mother   . Cancer Mother   . Aneurysm Father   . Parkinsonism Maternal Grandfather   . Cancer Maternal Grandfather   . Heart  disease Maternal Grandfather   . Heart disease Paternal Grandfather   . Stroke Paternal Grandfather     Social history:  reports that she has never smoked. She has never used smokeless tobacco. She reports that she does not drink alcohol or use illicit drugs.    Allergies  Allergen Reactions  . Doxycycline Hyclate   . Lyrica [Pregabalin]   . Vivelle-Dot [Estradiol]   . Ibuprofen     Medications:  Current Outpatient Prescriptions on File Prior to Visit  Medication Sig Dispense Refill  . ALPRAZolam (XANAX) 0.25 MG tablet Take 0.25 mg by mouth at bedtime as needed for sleep.      Marland Kitchen aspirin-acetaminophen-caffeine (EXCEDRIN MIGRAINE) 250-250-65 MG per tablet Take 1 tablet by mouth daily. Take two(2) tablets daily      . Cholecalciferol (VITAMIN D-3) 5000 UNITS TABS Take 1 tablet by mouth every other day.      . cyanocobalamin (,VITAMIN B-12,) 1000 MCG/ML injection Inject 1 mL (1,000 mcg total) into the muscle every 30 (thirty) days.  1 mL  11  . EVAMIST 1.53 MG/SPRAY transdermal spray Place 1.53 mg onto the skin daily.      Marland Kitchen gabapentin (NEURONTIN) 300 MG capsule Take 2 capsules (600 mg total) by mouth at bedtime.  90 capsule  5  . levothyroxine (SYNTHROID, LEVOTHROID) 50 MCG tablet Take 75 mcg by mouth daily.       Marland Kitchen  Pediatric Multivit-Minerals-C (MULTIVITAMIN GUMMIES CHILDRENS) CHEW Chew 1 tablet by mouth daily.      Marland Kitchen triamcinolone cream (KENALOG) 0.1 % Apply 0.1 g topically daily.      Marland Kitchen VAGIFEM 10 MCG TABS vaginal tablet Place 10 mg vaginally once a week. Take two(2) tablets monthly       No current facility-administered medications on file prior to visit.    ROS:  Out of a complete 14 system review of symptoms, the patient complains only of the following symptoms, and all other reviewed systems are negative.  Activity change, fatigue Eyes itching Restless legs, insomnia, daytime sleepiness Anemia Decreased concentration, depression, anxiety Nonhealing skin wound  Blood  pressure 118/86, pulse 91, weight 174 lb (78.926 kg).  Physical Exam  General: The patient is alert and cooperative at the time of the examination.  Skin: No significant peripheral edema is noted.   Neurologic Exam  Mental status: The patient is oriented x 3.  Cranial nerves: Facial symmetry is present. Speech is normal, no aphasia or dysarthria is noted. Extraocular movements are full. Visual fields are full.  Motor: The patient has good strength in all 4 extremities.  Sensory examination: The patient has a stocking pattern pinprick sensory deficit two thirds the way up the lower extremity bilaterally.  Coordination: The patient has good finger-nose-finger and heel-to-shin bilaterally.  Gait and station: The patient has a normal gait. Tandem gait is normal. Romberg is negative. No drift is seen. The patient is able to walk on heels and the toes bilaterally.  Reflexes: Deep tendon reflexes are symmetric.   Assessment/Plan:  1. Migraine headache  2. Peripheral neuropathy  3. Sjogren's syndrome  The patient continues to have significant discomfort with the peripheral neuropathy. She is having lancinating pains, burning and stinging sensations. The lancinating pains are bothersome to her, and this may respond to carbamazepine. The patient will be placed on Carbatrol taking 100 mg twice daily. If this is not effective, the dose will be increased to a 200 mg twice daily dose. In the future, Topamax may be used for the neuropathy pain and for the headache. The patient will followup in about 4 months.  Marlan Palau MD 05/22/2014 9:52 AM  [facility] [address] [place], [place] [address]  Phone [phone] Fax [phone]

## 2014-05-21 NOTE — Patient Instructions (Signed)

## 2014-06-04 ENCOUNTER — Encounter (HOSPITAL_BASED_OUTPATIENT_CLINIC_OR_DEPARTMENT_OTHER): Payer: 59 | Attending: General Surgery

## 2014-06-04 DIAGNOSIS — G609 Hereditary and idiopathic neuropathy, unspecified: Secondary | ICD-10-CM | POA: Diagnosis not present

## 2014-06-04 DIAGNOSIS — T8130XA Disruption of wound, unspecified, initial encounter: Secondary | ICD-10-CM | POA: Insufficient documentation

## 2014-06-04 DIAGNOSIS — I7300 Raynaud's syndrome without gangrene: Secondary | ICD-10-CM | POA: Insufficient documentation

## 2014-06-04 NOTE — Progress Notes (Signed)
[facility]  NAMMarland Kitchen:  [name], Cheyenne               ACCOUNT NO.:  [id number]  MEDICAL RECORD NO.:  [id number]      DATE OF BIRTH:  17-Nov-1960  PHYSICIAN:  Maxwell CaulMichael G. Josalyn Dettmann, M.D. VISIT DATE:  06/04/2014                                  OFFICE VISIT   [facility]  HISTORY:  Mrs. [name] is a 53 year old woman who traumatized her right anterior lower leg against the box on March 01, 2014.  She suffered a laceration that was sutured.  The sutures were removed in usual timeframe.  I presumed Steri-Strip; however, the wound dehisced.  The patient has simply been dressing this with topical dressings and there has been some improvement, but she is left with a nonhealing area.  She has a history of Raynaud's phenomenon as well as idiopathic peripheral neuropathy.  She is not a diabetic, does not have any previous wound history.  PAST MEDICAL HISTORY: 1. Raynaud's phenomenon 2. Peripheral neuropathy, idiopathic. 3. Hypothyroidism. 4. History of anemia.  PAST SURGICAL HISTORY:  Knee surgery on the left and a left muscle biopsy in 2009.  CURRENT MEDICATIONS: 1. Xanax 0.25 at bedtime p.r.n. 2. Excedrin Migraine one tablet by mouth, two tablets daily. 3. Vitamin D3 5000 units a day. 4. Vitamin B12 1000 units IM monthly. 5. Cymbalta 60 b.i.d. 6. Vagifem 10 mg vaginally every 30 days. 7. Estradiol 1.5 mg topically onto the skin daily. 8. Neurontin 300 b.i.d. 9. Norco one tablet q.6 p.r.n. 10.Synthroid 50 mcg daily.  PHYSICAL EXAMINATION:  Temperature is 98.2, pulse 72, respirations 20, blood pressure 131/84.  Circulatory Exam:  Peripheral pulses were palpable.  ABIs on the right leg were 0.99 indicating adequate perfusion.  Wound Exam:  The area in question is on the right anterior lower extremity, measured 1.1 x 1.2 x 0.2, there was no undermining.  The area was debrided of tan-colored adherent eschar and rolled skin edges.   She tolerated this well.  I think there is still eschar here that will need further debridement.  IMPRESSION:  Traumatic wound to the right leg as noted.  We dressed this with silver collagen, Hydrogel with a foam covering.  This could be changed every second day.  We will see her again in a week's time.          ______________________________ Maxwell CaulMichael G. Taylah Dubiel, M.D.     MGR/MEDQ  D:  06/04/2014  T:  06/04/2014  Job:  [id number]

## 2014-06-10 DIAGNOSIS — G609 Hereditary and idiopathic neuropathy, unspecified: Secondary | ICD-10-CM | POA: Diagnosis not present

## 2014-06-10 DIAGNOSIS — I7300 Raynaud's syndrome without gangrene: Secondary | ICD-10-CM | POA: Diagnosis not present

## 2014-06-10 DIAGNOSIS — T8130XA Disruption of wound, unspecified, initial encounter: Secondary | ICD-10-CM | POA: Diagnosis not present

## 2014-06-18 ENCOUNTER — Other Ambulatory Visit: Payer: Self-pay

## 2014-06-18 DIAGNOSIS — T8130XA Disruption of wound, unspecified, initial encounter: Secondary | ICD-10-CM | POA: Diagnosis not present

## 2014-06-18 DIAGNOSIS — I7300 Raynaud's syndrome without gangrene: Secondary | ICD-10-CM | POA: Diagnosis not present

## 2014-06-18 DIAGNOSIS — G609 Hereditary and idiopathic neuropathy, unspecified: Secondary | ICD-10-CM | POA: Diagnosis not present

## 2014-06-22 ENCOUNTER — Encounter: Payer: Self-pay | Admitting: Neurology

## 2014-06-25 ENCOUNTER — Encounter: Payer: Self-pay | Admitting: Neurology

## 2014-06-25 DIAGNOSIS — I7300 Raynaud's syndrome without gangrene: Secondary | ICD-10-CM | POA: Diagnosis not present

## 2014-06-25 DIAGNOSIS — G609 Hereditary and idiopathic neuropathy, unspecified: Secondary | ICD-10-CM | POA: Diagnosis not present

## 2014-06-25 DIAGNOSIS — T8130XA Disruption of wound, unspecified, initial encounter: Secondary | ICD-10-CM | POA: Diagnosis not present

## 2014-06-28 NOTE — Telephone Encounter (Signed)
Dr. Anne HahnWillis I spoke to this patient and she relayed that she hadn't heard anything about her labs that she emailed from Dr. Nickola MajorHawkes.  They are in chart review under email message.  I explained you were out of office and will check when you return.

## 2014-07-02 DIAGNOSIS — I7300 Raynaud's syndrome without gangrene: Secondary | ICD-10-CM | POA: Diagnosis not present

## 2014-07-02 DIAGNOSIS — T8130XA Disruption of wound, unspecified, initial encounter: Secondary | ICD-10-CM | POA: Diagnosis not present

## 2014-07-02 DIAGNOSIS — G609 Hereditary and idiopathic neuropathy, unspecified: Secondary | ICD-10-CM | POA: Diagnosis not present

## 2014-07-07 ENCOUNTER — Other Ambulatory Visit: Payer: Self-pay

## 2014-07-07 MED ORDER — GABAPENTIN 300 MG PO CAPS: 600.0000 mg | ORAL_CAPSULE | Freq: Every day | ORAL | Status: DC

## 2014-07-07 MED ORDER — CARBAMAZEPINE ER 100 MG PO CP12: 100.0000 mg | ORAL_CAPSULE | Freq: Two times a day (BID) | ORAL | Status: DC

## 2014-07-09 ENCOUNTER — Other Ambulatory Visit: Payer: Self-pay | Admitting: Gastroenterology

## 2014-07-16 ENCOUNTER — Encounter (HOSPITAL_BASED_OUTPATIENT_CLINIC_OR_DEPARTMENT_OTHER): Payer: 59 | Attending: General Surgery

## 2014-07-16 DIAGNOSIS — X58XXXD Exposure to other specified factors, subsequent encounter: Secondary | ICD-10-CM | POA: Diagnosis not present

## 2014-07-16 DIAGNOSIS — S81801D Unspecified open wound, right lower leg, subsequent encounter: Secondary | ICD-10-CM | POA: Insufficient documentation

## 2014-08-06 ENCOUNTER — Encounter (HOSPITAL_BASED_OUTPATIENT_CLINIC_OR_DEPARTMENT_OTHER): Payer: 59

## 2014-09-19 ENCOUNTER — Other Ambulatory Visit: Payer: Self-pay | Admitting: Neurology

## 2014-09-21 ENCOUNTER — Other Ambulatory Visit: Payer: Self-pay | Admitting: Neurology

## 2014-09-21 MED ORDER — HYDROCODONE-ACETAMINOPHEN 5-325 MG PO TABS: 1.0000 | ORAL_TABLET | Freq: Four times a day (QID) | ORAL | Status: DC | PRN

## 2014-09-22 ENCOUNTER — Telehealth: Payer: Self-pay

## 2014-09-22 NOTE — Telephone Encounter (Signed)
Called patient to inform Rx ready for pick up at front desk. No answer. Left vmail. 

## 2014-10-08 ENCOUNTER — Ambulatory Visit (INDEPENDENT_AMBULATORY_CARE_PROVIDER_SITE_OTHER): Payer: 59 | Admitting: Neurology

## 2014-10-08 ENCOUNTER — Encounter: Payer: Self-pay | Admitting: Neurology

## 2014-10-08 VITALS — BP 121/86 | HR 79 | Ht 65.0 in | Wt 172.0 lb

## 2014-10-08 DIAGNOSIS — G43009 Migraine without aura, not intractable, without status migrainosus: Secondary | ICD-10-CM

## 2014-10-08 DIAGNOSIS — G609 Hereditary and idiopathic neuropathy, unspecified: Secondary | ICD-10-CM

## 2014-10-08 DIAGNOSIS — Z5181 Encounter for therapeutic drug level monitoring: Secondary | ICD-10-CM

## 2014-10-08 MED ORDER — CARBAMAZEPINE ER 100 MG PO CP12: 100.0000 mg | ORAL_CAPSULE | Freq: Two times a day (BID) | ORAL | Status: DC

## 2014-10-08 MED ORDER — DULOXETINE HCL 30 MG PO CPEP: 30.0000 mg | ORAL_CAPSULE | Freq: Every day | ORAL | Status: DC

## 2014-10-08 NOTE — Progress Notes (Signed)
Reason for visit: Peripheral neuropathy  [name] is an 54 y.o. female  History of present illness:  [name] is a 54 year old right-handed white female with migraine headache and peripheral neuropathy associated with Sjogren's syndrome. The patient has had lancinating pains in the legs that have improved with the use of carbamazepine. The patient has some problems with restless leg syndrome, and periodic limb movements at night. She is on Cymbalta currently taking 60 mg at night, but she does not believe that this medication has been beneficial. The carbamazepine has been beneficial in helping her lancinating pains, but she still has some burning and stinging pain. She denies any significant issues with balance, she is not having falls. Her migraine headaches have improved, her last headache has been over 2 months ago. She recently bumped her left elbow, and she has pain associated with lifting with the arm, she has indicated that when she lifts with the palm of her hand facing upward, there is no pain.  Past Medical History  Diagnosis Date  . Migraines   . Myeloperoxidase deficiency   . Raynaud disease 04/24/2013  . Vitamin B12 deficiency   . Ankle fracture   . Peripheral neuropathy     Right  . Ankle fracture, left     Past Surgical History  Procedure Laterality Date  . Dental implants      x3  . Knee surgery Left   . Sural nerve biopsy    . Gastrocsoleus biopsy    . Lip biopsy      Family History  Problem Relation Age of Onset  . Migraines Mother   . Cancer Mother   . Aneurysm Father   . Parkinsonism Maternal Grandfather   . Cancer Maternal Grandfather   . Heart disease Maternal Grandfather   . Heart disease Paternal Grandfather   . Stroke Paternal Grandfather     Social history:  reports that she has never smoked. She has never used smokeless tobacco. She reports that she does not drink alcohol or use illicit drugs.    Allergies  Allergen Reactions    . Doxycycline Hyclate   . Lyrica [Pregabalin]   . Vivelle-Dot [Estradiol]     Adhesive on patch  . Ibuprofen     Medications:  Current Outpatient Prescriptions on File Prior to Visit  Medication Sig Dispense Refill  . ALPRAZolam (XANAX) 0.25 MG tablet Take 0.25 mg by mouth at bedtime as needed for sleep.    . cyanocobalamin (,VITAMIN B-12,) 1000 MCG/ML injection Inject 1 mL (1,000 mcg total) into the muscle every 30 (thirty) days. 1 mL 11  . EVAMIST 1.53 MG/SPRAY transdermal spray Place 1.53 mg onto the skin daily.    . ferrous fumarate (HEMOCYTE - 106 MG FE) 325 (106 FE) MG TABS tablet Take 1 tablet by mouth daily.     Marland Kitchen. gabapentin (NEURONTIN) 300 MG capsule Take 2 capsules (600 mg total) by mouth at bedtime. 60 capsule 3  . HYDROcodone-acetaminophen (NORCO/VICODIN) 5-325 MG per tablet Take 1 tablet by mouth every 6 (six) hours as needed. Must last 28 days. 120 tablet 0  . levothyroxine (SYNTHROID, LEVOTHROID) 50 MCG tablet Take 75 mcg by mouth daily.     . Pediatric Multivit-Minerals-C (MULTIVITAMIN GUMMIES CHILDRENS) CHEW Chew 1 tablet by mouth daily.    . progesterone (PROMETRIUM) 200 MG capsule Take 200 mg by mouth. Take 10 caps at bedtime every four months.    . triamcinolone cream (KENALOG) 0.1 % Apply 0.1 g  topically as needed.     Marland Kitchen VAGIFEM 10 MCG TABS vaginal tablet Place 10 mg vaginally once a week. Take two(2) tablets monthly     No current facility-administered medications on file prior to visit.    ROS:  Out of a complete 14 system review of symptoms, the patient complains only of the following symptoms, and all other reviewed systems are negative.  Restless leg syndrome Fatigue Joint pain Anemia Numbness, weakness Decreased concentration  Blood pressure 121/86, pulse 79, height  (1.651 m), weight 172 lb (78.019 kg).  Physical Exam  General: The patient is alert and cooperative at the time of the examination.  Skin: No significant peripheral edema is  noted.   Neurologic Exam  Mental status: The patient is oriented x 3.  Cranial nerves: Facial symmetry is present. Speech is normal, no aphasia or dysarthria is noted. Extraocular movements are full. Visual fields are full.  Motor: The patient has good strength in all 4 extremities.  Sensory examination: Soft touch sensation is symmetric on the face, arms, and legs.  Coordination: The patient has good finger-nose-finger and heel-to-shin bilaterally.  Gait and station: The patient has a normal gait. Tandem gait is normal. Romberg is negative. No drift is seen.  Reflexes: Deep tendon reflexes are symmetric.   Assessment/Plan:  1. Peripheral neuropathy  2. Sjogren syndrome  3. Migraine headache  4. Restless leg syndrome  The patient is doing well with her migraine. She continues to have some discomfort with her feet, and she does not believe that she has gained any benefit with the Cymbalta. We will reduce the Cymbalta dose to a 30 mg tablet for one month, then she will stop the medication. Blood work will be done today to check the liver profile, she has had a CBC done within the last month that was unremarkable. We will have carbamazepine level drawn. A prescription was given for carbamazepine, she will follow-up in 6 months. The left elbow pain appears to simulate a "tennis elbow". The patient is to refrain from any activities inducing pain, she is not to exercise using that arm.  Marlan Palau MD 10/08/2014 8:31 AM  [facility] [address] [place], [place] [address]  Phone [phone] Fax [phone]

## 2014-10-08 NOTE — Patient Instructions (Addendum)
With the Cymbalta, we will cut back to a 30 mg dosing daily for one month, then stop the medication.  Peripheral Neuropathy Peripheral neuropathy is a type of nerve damage. It affects nerves that carry signals between the spinal cord and other parts of the body. These are called peripheral nerves. With peripheral neuropathy, one nerve or a group of nerves may be damaged.  CAUSES  Many things can damage peripheral nerves. For some people with peripheral neuropathy, the cause is unknown. Some causes include:  Diabetes. This is the most common cause of peripheral neuropathy.  Injury to a nerve.  Pressure or stress on a nerve that lasts a long time.  Too little vitamin B. Alcoholism can lead to this.  Infections.  Autoimmune diseases, such as multiple sclerosis and systemic lupus erythematosus.  Inherited nerve diseases.  Some medicines, such as cancer drugs.  Toxic substances, such as lead and mercury.  Too little blood flowing to the legs.  Kidney disease.  Thyroid disease. SIGNS AND SYMPTOMS  Different people have different symptoms. The symptoms you have will depend on which of your nerves is damaged. Common symptoms include:  Loss of feeling (numbness) in the feet and hands.  Tingling in the feet and hands.  Pain that burns.  Very sensitive skin.  Weakness.  Not being able to move a part of the body (paralysis).  Muscle twitching.  Clumsiness or poor coordination.  Loss of balance.  Not being able to control your bladder.  Feeling dizzy.  Sexual problems. DIAGNOSIS  Peripheral neuropathy is a symptom, not a disease. Finding the cause of peripheral neuropathy can be hard. To figure that out, your health care provider will take a medical history and do a physical exam. A neurological exam will also be done. This involves checking things affected by your brain, spinal cord, and nerves (nervous system). For example, your health care provider will check your  reflexes, how you move, and what you can feel.  Other types of tests may also be ordered, such as:  Blood tests.  A test of the fluid in your spinal cord.  Imaging tests, such as CT scans or an MRI.  Electromyography (EMG). This test checks the nerves that control muscles.  Nerve conduction velocity tests. These tests check how fast messages pass through your nerves.  Nerve biopsy. A small piece of nerve is removed. It is then checked under a microscope. TREATMENT   Medicine is often used to treat peripheral neuropathy. Medicines may include:  Pain-relieving medicines. Prescription or over-the-counter medicine may be suggested.  Antiseizure medicine. This may be used for pain.  Antidepressants. These also may help ease pain from neuropathy.  Lidocaine. This is a numbing medicine. You might wear a patch or be given a shot.  Mexiletine. This medicine is typically used to help control irregular heart rhythms.  Surgery. Surgery may be needed to relieve pressure on a nerve or to destroy a nerve that is causing pain.  Physical therapy to help movement.  Assistive devices to help movement. HOME CARE INSTRUCTIONS   Only take over-the-counter or prescription medicines as directed by your health care provider. Follow the instructions carefully for any given medicines. Do not take any other medicines without first getting approval from your health care provider.  If you have diabetes, work closely with your health care provider to keep your blood sugar under control.  If you have numbness in your feet:  Check every day for signs of injury or infection.  Watch for redness, warmth, and swelling.  Wear padded socks and comfortable shoes. These help protect your feet.  Do not do things that put pressure on your damaged nerve.  Do not smoke. Smoking keeps blood from getting to damaged nerves.  Avoid or limit alcohol. Too much alcohol can cause a lack of B vitamins. These vitamins are  needed for healthy nerves.  Develop a good support system. Coping with peripheral neuropathy can be stressful. Talk to a mental health specialist or join a support group if you are struggling.  Follow up with your health care provider as directed. SEEK MEDICAL CARE IF:   You have new signs or symptoms of peripheral neuropathy.  You are struggling emotionally from dealing with peripheral neuropathy.  You have a fever. SEEK IMMEDIATE MEDICAL CARE IF:   You have an injury or infection that is not healing.  You feel very dizzy or begin vomiting.  You have chest pain.  You have trouble breathing. Document Released: 08/10/2002 Document Revised: 05/02/2011 Document Reviewed: 04/27/2013 [facility] Patient Information 2015 [place], [place]. This information is not intended to replace advice given to you by your health care provider. Make sure you discuss any questions you have with your health care provider.

## 2014-10-09 LAB — COMPREHENSIVE METABOLIC PANEL
A/G RATIO: 1.7 (ref 1.1–2.5)
ALK PHOS: 60 IU/L (ref 39–117)
ALT: 22 IU/L (ref 0–32)
AST: 29 IU/L (ref 0–40)
Albumin: 4.3 g/dL (ref 3.5–5.5)
BUN / CREAT RATIO: 14 (ref 9–23)
BUN: 11 mg/dL (ref 6–24)
CO2: 26 mmol/L (ref 18–29)
CREATININE: 0.77 mg/dL (ref 0.57–1.00)
Calcium: 9.2 mg/dL (ref 8.7–10.2)
Chloride: 101 mmol/L (ref 97–108)
GFR calc non Af Amer: 89 mL/min/{1.73_m2} (ref >59)
GFR, EST AFRICAN AMERICAN: 103 mL/min/{1.73_m2} (ref >59)
Globulin, Total: 2.6 g/dL (ref 1.5–4.5)
Glucose: 89 mg/dL (ref 65–99)
Potassium: 4.3 mmol/L (ref 3.5–5.2)
SODIUM: 141 mmol/L (ref 134–144)
Total Bilirubin: 0.3 mg/dL (ref 0.0–1.2)
Total Protein: 6.9 g/dL (ref 6.0–8.5)

## 2014-10-09 LAB — CARBAMAZEPINE LEVEL, TOTAL: Carbamazepine Lvl: 2.6 ug/mL — ABNORMAL LOW (ref 4.0–12.0)

## 2014-11-05 ENCOUNTER — Other Ambulatory Visit: Payer: Self-pay | Admitting: Neurology

## 2014-11-23 ENCOUNTER — Other Ambulatory Visit: Payer: Self-pay | Admitting: Neurology

## 2014-11-25 MED ORDER — HYDROCODONE-ACETAMINOPHEN 5-325 MG PO TABS: 1.0000 | ORAL_TABLET | Freq: Four times a day (QID) | ORAL | Status: DC | PRN

## 2014-12-31 ENCOUNTER — Other Ambulatory Visit: Payer: Self-pay | Admitting: Neurology

## 2015-04-08 ENCOUNTER — Ambulatory Visit (INDEPENDENT_AMBULATORY_CARE_PROVIDER_SITE_OTHER): Payer: 59 | Admitting: Neurology

## 2015-04-08 ENCOUNTER — Encounter: Payer: Self-pay | Admitting: Neurology

## 2015-04-08 VITALS — BP 113/83 | HR 68 | Ht 65.0 in | Wt 158.0 lb

## 2015-04-08 DIAGNOSIS — M7712 Lateral epicondylitis, left elbow: Secondary | ICD-10-CM | POA: Diagnosis not present

## 2015-04-08 DIAGNOSIS — G43009 Migraine without aura, not intractable, without status migrainosus: Secondary | ICD-10-CM

## 2015-04-08 DIAGNOSIS — G609 Hereditary and idiopathic neuropathy, unspecified: Secondary | ICD-10-CM

## 2015-04-08 DIAGNOSIS — G4761 Periodic limb movement disorder: Secondary | ICD-10-CM

## 2015-04-08 HISTORY — DX: Periodic limb movement disorder: G47.61

## 2015-04-08 MED ORDER — HYDROCODONE-ACETAMINOPHEN 5-325 MG PO TABS: 1.0000 | ORAL_TABLET | Freq: Four times a day (QID) | ORAL | Status: DC | PRN

## 2015-04-08 MED ORDER — CARBAMAZEPINE ER 200 MG PO TB12: 200.0000 mg | ORAL_TABLET | Freq: Two times a day (BID) | ORAL | Status: DC

## 2015-04-08 MED ORDER — PRAMIPEXOLE DIHYDROCHLORIDE 0.25 MG PO TABS: 0.2500 mg | ORAL_TABLET | Freq: Three times a day (TID) | ORAL | Status: DC

## 2015-04-08 NOTE — Progress Notes (Signed)
Reason for visit: Peripheral neuropathy  [name] is an 54 y.o. female  History of present illness:  [name] is a 54 year old right-handed white female with a history of Sjogren syndrome associated with a peripheral neuropathy. The patient is followed by Dr. Nickola Major. The patient has had ongoing discomfort in the legs, the carbamazepine has helped some, she is only on 100 mg twice daily. The patient has come off of Cymbalta without any change in her underlying pain. She denies any significant balance issues. The patient is having some migraine headaches, but this is not a significant problem for her. The patient continues to have left elbow pain. The patient is felt to have lateral epicondylitis, but she has not responded to conservative therapy. She is interested in thermal treatments for her peripheral neuropathy. The patient returns for an evaluation. She denies any falls. She does report that she has periodic limb movements at night, she has some fatigue in the morning, she does not recall waking up, but she never seems to be rested. The patient returns for further evaluation.  Past Medical History  Diagnosis Date  . Migraines   . Myeloperoxidase deficiency   . Raynaud disease 04/24/2013  . Vitamin B12 deficiency   . Ankle fracture   . Peripheral neuropathy     Right  . Ankle fracture, left   . Glaucoma     narrow angle  . Periodic limb movement disorder 04/08/2015    Past Surgical History  Procedure Laterality Date  . Dental implants      x3  . Knee surgery Left   . Sural nerve biopsy    . Gastrocsoleus biopsy    . Lip biopsy      Family History  Problem Relation Age of Onset  . Migraines Mother   . Cancer Mother   . Aneurysm Father   . Parkinsonism Maternal Grandfather   . Cancer Maternal Grandfather   . Heart disease Maternal Grandfather   . Heart disease Paternal Grandfather   . Stroke Paternal Grandfather     Social history:  reports that she has  never smoked. She has never used smokeless tobacco. She reports that she drinks alcohol. She reports that she does not use illicit drugs.    Allergies  Allergen Reactions  . Doxycycline Hyclate   . Lyrica [Pregabalin]   . Vivelle-Dot [Estradiol]     Adhesive on patch  . Aleve [Naproxen Sodium]     swelling  . Ibuprofen     Medications:  Prior to Admission medications   Medication Sig Start Date End Date Taking? Authorizing Provider  ALPRAZolam Prudy Feeler) 0.25 MG tablet Take 0.25 mg by mouth at bedtime as needed for sleep.   Yes Historical Provider, MD  carbamazepine (CARBATROL) 100 MG 12 hr capsule Take 1 capsule (100 mg total) by mouth 2 (two) times daily. 10/08/14  Yes York Spaniel, MD  Cholecalciferol (VITAMIN D3) 2000 UNITS TABS Take 2,000 Units by mouth daily.   Yes Historical Provider, MD  cyanocobalamin (,VITAMIN B-12,) 1000 MCG/ML injection INJECT 1 ML INTRAMUSCULARLY EVERY 30 DAYS AS DIRECTED 11/05/14  Yes York Spaniel, MD  EVAMIST 1.53 MG/SPRAY transdermal spray Place 1.53 mg onto the skin daily. 09/07/13  Yes Historical Provider, MD  gabapentin (NEURONTIN) 300 MG capsule TAKE 2 CAPSULES (600 MG TOTAL) BY MOUTH AT BEDTIME. 12/31/14  Yes York Spaniel, MD  HYDROcodone-acetaminophen (NORCO/VICODIN) 5-325 MG per tablet Take 1 tablet by mouth every 6 (six) hours as  needed. Must last 28 days. 11/25/14  Yes York Spaniel, MD  levothyroxine (SYNTHROID, LEVOTHROID) 75 MCG tablet Take 75 mcg by mouth daily before breakfast.   Yes Historical Provider, MD  Pediatric Multivit-Minerals-C (MULTIVITAMIN GUMMIES CHILDRENS) CHEW Chew 1 tablet by mouth daily.   Yes Historical Provider, MD  progesterone (PROMETRIUM) 200 MG capsule Take 200 mg by mouth. Take 10 caps at bedtime every four months. 03/07/14  Yes Historical Provider, MD  triamcinolone cream (KENALOG) 0.1 % Apply 0.1 g topically as needed.  08/12/13  Yes Historical Provider, MD  VAGIFEM 10 MCG TABS vaginal tablet Place 10 mg vaginally  once a week. Take two(2) tablets monthly 02/28/13  Yes Historical Provider, MD    ROS:  Out of a complete 14 system review of symptoms, the patient complains only of the following symptoms, and all other reviewed systems are negative.  Foot discomfort Left elbow pain  Blood pressure 113/83, pulse 68, height  (1.651 m), weight 158 lb (71.668 kg).  Physical Exam  General: The patient is alert and cooperative at the time of the examination.  Skin: No significant peripheral edema is noted.   Neurologic Exam  Mental status: The patient is alert and oriented x 3 at the time of the examination. The patient has apparent normal recent and remote memory, with an apparently normal attention span and concentration ability.   Cranial nerves: Facial symmetry is present. Speech is normal, no aphasia or dysarthria is noted. Extraocular movements are full. Visual fields are full.  Motor: The patient has good strength in all 4 extremities.  Sensory examination: Soft touch sensation is symmetric on the face, arms, and legs. There is a stocking pattern pinprick sensory deficit up to the knees bilaterally.  Coordination: The patient has good finger-nose-finger and heel-to-shin bilaterally.  Gait and station: The patient has a normal gait. Tandem gait is normal. Romberg is negative. No drift is seen.  Reflexes: Deep tendon reflexes are symmetric, but are depressed.   Assessment/Plan:  1. Peripheral neuropathy, Sjogren's syndrome  2. Migraine headache, well controlled  3. Left elbow pain, possible lateral epicondylitis  4. Periodic limb movements  The patient will be referred for an orthopedic surgery evaluation for the left elbow discomfort. The patient will be placed on low-dose Mirapex at night for the periodic limb movements, if this is helpful, we will continue the medication. The carbamazepine levels done on last visit were low at 2.6, the dosing will be increased taking 200 mg  twice daily. The patient will follow-up in 6 months. She has had recent blood work done through her primary care physician on 03/04/2015. The CBC and comprehensive metabolic profile were unremarkable. The patient will be referred to physical therapy for Anodyne treatments for the peripheral neuropathy.  Marlan Palau MD 04/08/2015 2:36 PM  [facility] [address] [place], [place] [address]  Phone [phone] Fax [phone]

## 2015-04-08 NOTE — Patient Instructions (Addendum)
We will increase the carbamazepine taking 200 mg twice daily. I will add a medication called Mirapex at nighttime for the jerks of the legs. We will send her to physical therapy for Anodyne therapy for the peripheral neuropathy.   Peripheral Neuropathy Peripheral neuropathy is a type of nerve damage. It affects nerves that carry signals between the spinal cord and other parts of the body. These are called peripheral nerves. With peripheral neuropathy, one nerve or a group of nerves may be damaged.  CAUSES  Many things can damage peripheral nerves. For some people with peripheral neuropathy, the cause is unknown. Some causes include:  Diabetes. This is the most common cause of peripheral neuropathy.  Injury to a nerve.  Pressure or stress on a nerve that lasts a long time.  Too little vitamin B. Alcoholism can lead to this.  Infections.  Autoimmune diseases, such as multiple sclerosis and systemic lupus erythematosus.  Inherited nerve diseases.  Some medicines, such as cancer drugs.  Toxic substances, such as lead and mercury.  Too little blood flowing to the legs.  Kidney disease.  Thyroid disease. SIGNS AND SYMPTOMS  Different people have different symptoms. The symptoms you have will depend on which of your nerves is damaged. Common symptoms include:  Loss of feeling (numbness) in the feet and hands.  Tingling in the feet and hands.  Pain that burns.  Very sensitive skin.  Weakness.  Not being able to move a part of the body (paralysis).  Muscle twitching.  Clumsiness or poor coordination.  Loss of balance.  Not being able to control your bladder.  Feeling dizzy.  Sexual problems. DIAGNOSIS  Peripheral neuropathy is a symptom, not a disease. Finding the cause of peripheral neuropathy can be hard. To figure that out, your health care provider will take a medical history and do a physical exam. A neurological exam will also be done. This involves  checking things affected by your brain, spinal cord, and nerves (nervous system). For example, your health care provider will check your reflexes, how you move, and what you can feel.  Other types of tests may also be ordered, such as:  Blood tests.  A test of the fluid in your spinal cord.  Imaging tests, such as CT scans or an MRI.  Electromyography (EMG). This test checks the nerves that control muscles.  Nerve conduction velocity tests. These tests check how fast messages pass through your nerves.  Nerve biopsy. A small piece of nerve is removed. It is then checked under a microscope. TREATMENT   Medicine is often used to treat peripheral neuropathy. Medicines may include:  Pain-relieving medicines. Prescription or over-the-counter medicine may be suggested.  Antiseizure medicine. This may be used for pain.  Antidepressants. These also may help ease pain from neuropathy.  Lidocaine. This is a numbing medicine. You might wear a patch or be given a shot.  Mexiletine. This medicine is typically used to help control irregular heart rhythms.  Surgery. Surgery may be needed to relieve pressure on a nerve or to destroy a nerve that is causing pain.  Physical therapy to help movement.  Assistive devices to help movement. HOME CARE INSTRUCTIONS   Only take over-the-counter or prescription medicines as directed by your health care provider. Follow the instructions carefully for any given medicines. Do not take any other medicines without first getting approval from your health care provider.  If you have diabetes, work closely with your health care provider to keep your blood sugar  under control.  If you have numbness in your feet:  Check every day for signs of injury or infection. Watch for redness, warmth, and swelling.  Wear padded socks and comfortable shoes. These help protect your feet.  Do not do things that put pressure on your damaged nerve.  Do not smoke. Smoking  keeps blood from getting to damaged nerves.  Avoid or limit alcohol. Too much alcohol can cause a lack of B vitamins. These vitamins are needed for healthy nerves.  Develop a good support system. Coping with peripheral neuropathy can be stressful. Talk to a mental health specialist or join a support group if you are struggling.  Follow up with your health care provider as directed. SEEK MEDICAL CARE IF:   You have new signs or symptoms of peripheral neuropathy.  You are struggling emotionally from dealing with peripheral neuropathy.  You have a fever. SEEK IMMEDIATE MEDICAL CARE IF:   You have an injury or infection that is not healing.  You feel very dizzy or begin vomiting.  You have chest pain.  You have trouble breathing. Document Released: 08/10/2002 Document Revised: 05/02/2011 Document Reviewed: 04/27/2013 [facility] Patient Information 2015 [place], [place]. This information is not intended to replace advice given to you by your health care provider. Make sure you discuss any questions you have with your health care provider.

## 2015-06-03 ENCOUNTER — Ambulatory Visit: Payer: 59 | Attending: Neurology

## 2015-06-03 ENCOUNTER — Telehealth: Payer: Self-pay | Admitting: Neurology

## 2015-06-03 DIAGNOSIS — R269 Unspecified abnormalities of gait and mobility: Secondary | ICD-10-CM

## 2015-06-03 NOTE — Therapy (Signed)
[facility] [address] [place], [place], [address] Phone: [phone]   Fax:  [phone]  Physical Therapy Evaluation  Patient Details  Name: [name] MRN: [id number] Date of Birth: 05/16/1961 Referring Provider:  York Spaniel, MD  Encounter Date: 06/03/2015  No charge for visit.       PT End of Session - 06/03/15 1203    Visit Number 1   Number of Visits 1   Date for PT Re-Evaluation 06/03/15   PT Start Time 1139   PT Stop Time 1151   PT Time Calculation (min) 12 min      Past Medical History  Diagnosis Date  . Migraines   . Myeloperoxidase deficiency   . Raynaud disease 04/24/2013  . Vitamin B12 deficiency   . Ankle fracture   . Peripheral neuropathy     Right  . Ankle fracture, left   . Glaucoma     narrow angle  . Periodic limb movement disorder 04/08/2015    Past Surgical History  Procedure Laterality Date  . Dental implants      x3  . Knee surgery Left   . Sural nerve biopsy    . Gastrocsoleus biopsy    . Lip biopsy      There were no vitals filed for this visit.  Visit Diagnosis:  Abnormality of gait      Subjective Assessment - 06/03/15 1200    Subjective Pt reported she wants Anodyne treatment for peripheral neuropathy, as Dr. Anne Hahn recommended this form of treatment with PT. PT explained that we do not offer Anodyne based on the lack of evidence supporting Anodyne and insurance does not cover it. 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 However, she wishes to receive the Anodyne treatment in an attempt to reduce pain. PT will not charge for this visit as pt wishes to go to a clinic which offers Anodyne. PT will notify MD that our clinic does not offer Anodyne.                                            Plan - 06/03/15 1204    Clinical Impression  Statement No charge for visit.         Problem List Patient Active Problem List   Diagnosis Date Noted  . Periodic limb movement disorder 04/08/2015  . Migraine without aura 04/24/2013  . Unspecified tinnitus 10/03/2012  . Keratoconjunctivitis sicca, not specified as Sjogren's 10/03/2012  . Other B-complex deficiencies 10/03/2012  . Hereditary and idiopathic peripheral neuropathy 10/03/2012  . Disturbance of skin sensation 10/03/2012    Miller,Jennifer L 06/03/2015, 12:06 PM  [facility] [address] [place], [place], [address] Phone: [phone]   Fax:  [phone]    Zerita Boers, PT,DPT 06/03/2015 12:06 PM Phone: [phone] Fax: [phone]

## 2015-06-03 NOTE — Telephone Encounter (Signed)
Referral for physical therapy for Anodyne therapy. The place she was referred to does not do it. Please call back to let her know what to do next. [phone]

## 2015-06-03 NOTE — Telephone Encounter (Signed)
The referral was sent [facility] rather than [facility]. I have faxed the referral to [facility] and called the patient to let her know.

## 2015-07-08 ENCOUNTER — Other Ambulatory Visit: Payer: Self-pay | Admitting: Neurology

## 2015-07-08 MED ORDER — HYDROCODONE-ACETAMINOPHEN 5-325 MG PO TABS
1.0000 | ORAL_TABLET | Freq: Four times a day (QID) | ORAL | Status: DC | PRN
Start: 2015-07-08 — End: 2015-10-07

## 2015-07-08 NOTE — Telephone Encounter (Signed)
Pt called requesting refill for HYDROcodone-acetaminophen (NORCO/VICODIN) 5-325 MG per tablet .

## 2015-07-08 NOTE — Telephone Encounter (Signed)
Request entered, forwarded to provider for review.  

## 2015-08-31 ENCOUNTER — Telehealth: Payer: Self-pay | Admitting: Neurology

## 2015-08-31 ENCOUNTER — Other Ambulatory Visit: Payer: Self-pay | Admitting: Neurology

## 2015-08-31 NOTE — Telephone Encounter (Signed)
Ins has been contacted and provided with clinical info.  Request is under review Ref # [id number]  Ins responded stating Tegretol XR tablets are covered under current policy, with no prior auth required.  Called patient back to advise.  Got no answer.  Left message.

## 2015-08-31 NOTE — Telephone Encounter (Signed)
Pt called and states that she received a letter from her insurance company telling her they will not cover carbamazepine (TEGRETOL-XR) 200 MG 12 hr tablet but will in a capsule. She will need a refill and a prior auth on the medicaton.

## 2015-09-01 MED ORDER — CARBAMAZEPINE ER 200 MG PO TB12: 200.0000 mg | ORAL_TABLET | Freq: Two times a day (BID) | ORAL | Status: DC

## 2015-09-07 ENCOUNTER — Telehealth: Payer: Self-pay

## 2015-09-07 MED ORDER — CARBAMAZEPINE ER 200 MG PO CP12: 200.0000 mg | ORAL_CAPSULE | Freq: Two times a day (BID) | ORAL | Status: DC

## 2015-09-07 NOTE — Telephone Encounter (Signed)
Okay to change to Carbatrol, Carbatrol is a better product anyway. I have written a prescription.

## 2015-09-07 NOTE — Telephone Encounter (Signed)
Patient called the office stating her ins does cover Carbamazepine ER tablets, however her co-pay will be $50.  Says they told her if we change the Rx to Carbatrol (Carbamazpine) Capsules, her co-pay would be reduced.  Okay to change?  Please advise.  Thank you.

## 2015-09-07 NOTE — Telephone Encounter (Signed)
Pt called and says that she needs a new rx for Tegretol in capsules, Not tablets. Her insurance will charge her $50 copay for the tablet. The capsules will be a lower copay. May call pt at [phone]

## 2015-09-08 ENCOUNTER — Telehealth: Payer: Self-pay | Admitting: Neurology

## 2015-09-08 NOTE — Telephone Encounter (Signed)
I called back.  Verified patient will d/c tablet and was changed to caps.

## 2015-09-08 NOTE — Telephone Encounter (Signed)
Cheyenne CornfieldStephanie with [phone]) [facility] called inquiring if pt is to remain on the gen tegretol xr tab or switch to  carbamazepine (CARBATROL) 200 MG 12 hr capsule

## 2015-10-07 ENCOUNTER — Ambulatory Visit (INDEPENDENT_AMBULATORY_CARE_PROVIDER_SITE_OTHER): Payer: 59 | Admitting: Neurology

## 2015-10-07 ENCOUNTER — Encounter: Payer: Self-pay | Admitting: Neurology

## 2015-10-07 VITALS — BP 122/86 | HR 72 | Ht 66.0 in | Wt 157.0 lb

## 2015-10-07 DIAGNOSIS — G43009 Migraine without aura, not intractable, without status migrainosus: Secondary | ICD-10-CM | POA: Diagnosis not present

## 2015-10-07 DIAGNOSIS — G4761 Periodic limb movement disorder: Secondary | ICD-10-CM

## 2015-10-07 DIAGNOSIS — G609 Hereditary and idiopathic neuropathy, unspecified: Secondary | ICD-10-CM

## 2015-10-07 MED ORDER — HYDROCODONE-ACETAMINOPHEN 5-325 MG PO TABS: 1.0000 | ORAL_TABLET | Freq: Four times a day (QID) | ORAL | Status: DC | PRN

## 2015-10-07 NOTE — Patient Instructions (Signed)
Neuropathic Pain Neuropathic pain is pain caused by damage to the nerves that are responsible for certain sensations in your body (sensory nerves). The pain can be caused by damage to:   The sensory nerves that send signals to your spinal cord and brain (peripheral nervous system).  The sensory nerves in your brain or spinal cord (central nervous system). Neuropathic pain can make you more sensitive to pain. What would be a minor sensation for most people may feel very painful if you have neuropathic pain. This is usually a long-term condition that can be difficult to treat. The type of pain can differ from person to person. It may start suddenly (acute), or it may develop slowly and last for a long time (chronic). Neuropathic pain may come and go as damaged nerves heal or may stay at the same level for years. It often causes emotional distress, loss of sleep, and a lower quality of life. CAUSES  The most common cause of damage to a sensory nerve is diabetes. Many other diseases and conditions can also cause neuropathic pain. Causes of neuropathic pain can be classified as:  Toxic. Many drugs and chemicals can cause toxic damage. The most common cause of toxic neuropathic pain is damage from drug treatment for cancer (chemotherapy).  Metabolic. This type of pain can happen when a disease causes imbalances that damage nerves. Diabetes is the most common of these diseases. Vitamin B deficiency caused by long-term alcohol abuse is another common cause.  Traumatic. Any injury that cuts, crushes, or stretches a nerve can cause damage and pain. A common example is feeling pain after losing an arm or leg (phantom limb pain).  Compression-related. If a sensory nerve gets trapped or compressed for a long period of time, the blood supply to the nerve can be cut off.  Vascular. Many blood vessel diseases can cause neuropathic pain by decreasing blood supply and oxygen to nerves.  Autoimmune. This type of  pain results from diseases in which the body's defense system mistakenly attacks sensory nerves. Examples of autoimmune diseases that can cause neuropathic pain include lupus and multiple sclerosis.  Infectious. Many types of viral infections can damage sensory nerves and cause pain. Shingles infection is a common cause of this type of pain.  Inherited. Neuropathic pain can be a symptom of many diseases that are passed down through families (genetic). SIGNS AND SYMPTOMS  The main symptom is pain. Neuropathic pain is often described as:  Burning.  Shock-like.  Stinging.  Hot or cold.  Itching. DIAGNOSIS  No single test can diagnose neuropathic pain. Your health care provider will do a physical exam and ask you about your pain. You may use a pain scale to describe how bad your pain is. You may also have tests to see if you have a high sensitivity to pain and to help find the cause and location of any sensory nerve damage. These tests may include:  Imaging studies, such as:  X-rays.  CT scan.  MRI.  Nerve conduction studies to test how well nerve signals travel through your sensory nerves (electrodiagnostic testing).  Stimulating your sensory nerves through electrodes on your skin and measuring the response in your spinal cord and brain (somatosensory evoked potentials). TREATMENT  Treatment for neuropathic pain may change over time. You may need to try different treatment options or a combination of treatments. Some options include:  Over-the-counter pain relievers.  Prescription medicines. Some medicines used to treat other conditions may also help neuropathic pain. These   include medicines to:  Control seizures (anticonvulsants).  Relieve depression (antidepressants).  Prescription-strength pain relievers (narcotics). These are usually used when other pain relievers do not help.  Transcutaneous nerve stimulation (TENS). This uses electrical currents to block painful nerve  signals. The treatment is painless.  Topical and local anesthetics. These are medicines that numb the nerves. They can be injected as a nerve block or applied to the skin.  Alternative treatments, such as:  Acupuncture.  Meditation.  Massage.  Physical therapy.  Pain management programs.  Counseling. HOME CARE INSTRUCTIONS  Learn as much as you can about your condition.  Take medicines only as directed by your health care provider.  Work closely with all your health care providers to find what works best for you.  Have a good support system at home.  Consider joining a chronic pain support group. SEEK MEDICAL CARE IF:  Your pain treatments are not helping.  You are having side effects from your medicines.  You are struggling with fatigue, mood changes, depression, or anxiety.   This information is not intended to replace advice given to you by your health care provider. Make sure you discuss any questions you have with your health care provider.   Document Released: 05/17/2004 Document Revised: 09/10/2014 Document Reviewed: 01/28/2014 Elsevier Interactive Patient Education 2016 [employer]  

## 2015-10-07 NOTE — Progress Notes (Signed)
Reason for visit: Peripheral neuropathy  [name] is an 55 y.o. female  History of present illness:  [name] is a 55 year old right-handed white female with a history of a peripheral neuropathy and migraine headaches. The patient also has periodic limb movements. She was placed on Mirapex when last seen in low dose, but this resulted in too much daytime drowsiness, and she stopped the medication. The patient has been placed on carbamazepine for the peripheral neuropathy discomfort, particularly for the lancinating pains that occur with the neuropathy. She has some episodes of lancinating pains that may occur daily for week, and then she may not have any problems for month. She is tolerating the carbamazepine, she is on gabapentin taking 600 mg at night, she cannot take this medication during the day secondary to cognitive side effects. Her insurance would not pay for Gralise. She has had blood work done recently to include a CBC and a comprehensive metabolic profile, these studies were unremarkable. The patient does have some mild gait instability, but no falls since last seen. She remains active. She has undergone some physical therapy for her peripheral neuropathy, she has had a TENS unit type treatment, but these did not offer benefit.  Past Medical History  Diagnosis Date  . Migraines   . Myeloperoxidase deficiency (HCC)   . Raynaud disease 04/24/2013  . Vitamin B12 deficiency   . Ankle fracture   . Peripheral neuropathy (HCC)     Right  . Ankle fracture, left   . Glaucoma     narrow angle  . Periodic limb movement disorder 04/08/2015    Past Surgical History  Procedure Laterality Date  . Dental implants      x3  . Knee surgery Left   . Sural nerve biopsy    . Gastrocsoleus biopsy    . Lip biopsy      Family History  Problem Relation Age of Onset  . Migraines Mother   . Cancer Mother   . Aneurysm Father   . Parkinsonism Maternal Grandfather   . Cancer  Maternal Grandfather   . Heart disease Maternal Grandfather   . Heart disease Paternal Grandfather   . Stroke Paternal Grandfather     Social history:  reports that she has never smoked. She has never used smokeless tobacco. She reports that she drinks alcohol. She reports that she does not use illicit drugs.    Allergies  Allergen Reactions  . Doxycycline Hyclate   . Lyrica [Pregabalin]   . Vivelle-Dot [Estradiol]     Adhesive on patch  . Aleve [Naproxen Sodium]     swelling  . Ibuprofen     Medications:  Prior to Admission medications   Medication Sig Start Date End Date Taking? Authorizing Provider  ALPRAZolam Prudy Feeler) 0.25 MG tablet Take 0.25 mg by mouth at bedtime as needed for sleep.   Yes Historical Provider, MD  carbamazepine (CARBATROL) 200 MG 12 hr capsule Take 1 capsule (200 mg total) by mouth 2 (two) times daily. 09/07/15  Yes York Spaniel, MD  Cholecalciferol (VITAMIN D3) 2000 UNITS TABS Take 2,000 Units by mouth daily.   Yes Historical Provider, MD  cyanocobalamin (,VITAMIN B-12,) 1000 MCG/ML injection INJECT 1 ML INTRAMUSCULARLY EVERY 30 DAYS AS DIRECTED 11/05/14  Yes York Spaniel, MD  EVAMIST 1.53 MG/SPRAY transdermal spray Place 1.53 mg onto the skin daily. 09/07/13  Yes Historical Provider, MD  gabapentin (NEURONTIN) 300 MG capsule TAKE 2 CAPSULES (600 MG TOTAL) BY  MOUTH DAILY AT BEDTIME AS DIRECTED 09/01/15  Yes York Spaniel, MD  HYDROcodone-acetaminophen (NORCO/VICODIN) 5-325 MG tablet Take 1 tablet by mouth every 6 (six) hours as needed. Must last 28 days. 07/08/15  Yes York Spaniel, MD  levothyroxine (SYNTHROID, LEVOTHROID) 75 MCG tablet Take 75 mcg by mouth daily before breakfast.   Yes Historical Provider, MD  Pediatric Multivit-Minerals-C (MULTIVITAMIN GUMMIES CHILDRENS) CHEW Chew 1 tablet by mouth daily.   Yes Historical Provider, MD  progesterone (PROMETRIUM) 200 MG capsule Take 200 mg by mouth. Take 10 caps at bedtime every four months. 03/07/14  Yes  Historical Provider, MD  triamcinolone cream (KENALOG) 0.1 % Apply 0.1 g topically as needed.  08/12/13  Yes Historical Provider, MD  VAGIFEM 10 MCG TABS vaginal tablet Place 10 mg vaginally once a week. Take two(2) tablets monthly 02/28/13  Yes Historical Provider, MD    ROS:  Out of a complete 14 system review of symptoms, the patient complains only of the following symptoms, and all other reviewed systems are negative.  Eye itching Restless legs Muscle cramps Skin rash Headache  Blood pressure 122/86, pulse 72, height  (1.676 m), weight 157 lb (71.215 kg).  Physical Exam  General: The patient is alert and cooperative at the time of the examination.  Skin: No significant peripheral edema is noted.   Neurologic Exam  Mental status: The patient is alert and oriented x 3 at the time of the examination. The patient has apparent normal recent and remote memory, with an apparently normal attention span and concentration ability.   Cranial nerves: Facial symmetry is present. Speech is normal, no aphasia or dysarthria is noted. Extraocular movements are full. Visual fields are full.  Motor: The patient has good strength in all 4 extremities.  Sensory examination: Soft touch sensation is symmetric on the face, arms, and legs.  Coordination: The patient has good finger-nose-finger and heel-to-shin bilaterally. There is a stocking pattern pinprick sensory deficit two thirds the way up the lower extremities bilaterally.  Gait and station: The patient has a normal gait. Tandem gait is normal. Romberg is negative. No drift is seen.  Reflexes: Deep tendon reflexes are symmetric, but are slightly depressed.   Assessment/Plan:  1. Peripheral neuropathy  2. Migraine headache  3. Periodic limb movements  The patient is relatively stable. She has had recent blood work done. She could not tolerate the Mirapex for the periodic limb movements, I recommended trying low-dose Sinemet,  she does not wish to take that medication currently. The patient will continue on her carbamazepine and gabapentin. A prescription was given for hydrocodone, she will follow-up in 6 months, sooner if needed.   Marlan Palau MD 10/07/2015 2:16 PM  [facility] [address] [place], [place] [address]  Phone [phone] Fax [phone]

## 2015-12-09 ENCOUNTER — Other Ambulatory Visit: Payer: Self-pay | Admitting: Neurology

## 2015-12-09 MED ORDER — HYDROCODONE-ACETAMINOPHEN 5-325 MG PO TABS: 1.0000 | ORAL_TABLET | Freq: Four times a day (QID) | ORAL | Status: DC | PRN

## 2015-12-09 NOTE — Telephone Encounter (Signed)
Spoke to pt and she will pick up next week at her convenience.  Placed up front.

## 2015-12-09 NOTE — Telephone Encounter (Signed)
Patient is calling to get a written Rx for HYDROcodone-acetaminophen (NORCO/VICODIN) 5-325 MG tablet. I advised the Rx will be ready in 24 hours (Monday) unless the nurse advises otherwise.

## 2016-01-03 ENCOUNTER — Other Ambulatory Visit: Payer: Self-pay | Admitting: Neurology

## 2016-04-08 ENCOUNTER — Other Ambulatory Visit: Payer: Self-pay | Admitting: Neurology

## 2016-04-09 ENCOUNTER — Other Ambulatory Visit: Payer: Self-pay

## 2016-04-09 MED ORDER — CARBAMAZEPINE ER 200 MG PO CP12: 200.0000 mg | ORAL_CAPSULE | Freq: Two times a day (BID) | ORAL | 11 refills | Status: DC

## 2016-04-09 NOTE — Telephone Encounter (Signed)
Rx resent.

## 2016-04-27 ENCOUNTER — Encounter: Payer: Self-pay | Admitting: Neurology

## 2016-04-27 ENCOUNTER — Ambulatory Visit (INDEPENDENT_AMBULATORY_CARE_PROVIDER_SITE_OTHER): Payer: 59 | Admitting: Neurology

## 2016-04-27 VITALS — BP 111/84 | HR 84 | Ht 66.0 in | Wt 156.8 lb

## 2016-04-27 DIAGNOSIS — G43009 Migraine without aura, not intractable, without status migrainosus: Secondary | ICD-10-CM | POA: Diagnosis not present

## 2016-04-27 DIAGNOSIS — G4761 Periodic limb movement disorder: Secondary | ICD-10-CM | POA: Diagnosis not present

## 2016-04-27 DIAGNOSIS — G609 Hereditary and idiopathic neuropathy, unspecified: Secondary | ICD-10-CM | POA: Diagnosis not present

## 2016-04-27 MED ORDER — GABAPENTIN 300 MG PO CAPS: ORAL_CAPSULE | ORAL | 5 refills | Status: DC

## 2016-04-27 MED ORDER — HYDROCODONE-ACETAMINOPHEN 5-325 MG PO TABS: 1.0000 | ORAL_TABLET | Freq: Four times a day (QID) | ORAL | 0 refills | Status: DC | PRN

## 2016-04-27 NOTE — Progress Notes (Signed)
Reason for visit: Peripheral neuropathy  [name] is an 55 y.o. female  History of present illness:  [name] is a 55 year old right-handed white female with a history of a peripheral neuropathy and a history of migraine headaches. The patient has periodic limb movements, restless leg syndrome. The patient is on gabapentin in the evening hours for the periodic limb movements and for the neuropathy discomfort. In the past, she has gained good benefit with her lower extremity discomfort with physical therapy with neuromuscular therapy. The patient does have some mild gait instability, she has had an occasional fall. She indicates that her headaches are doing well currently, but they are seasonal in nature and seem to be activated during certain times of the year. The patient may have one headache every 3 or 4 weeks. She returns to this office for an evaluation. The patient does report occasional episodes of increased leg numbness that comes and goes.  Past Medical History:  Diagnosis Date  . Ankle fracture   . Ankle fracture, left   . Glaucoma    narrow angle  . Migraines   . Myeloperoxidase deficiency (HCC)   . Periodic limb movement disorder 04/08/2015  . Peripheral neuropathy (HCC)    Right  . Raynaud disease 04/24/2013  . Vitamin B12 deficiency     Past Surgical History:  Procedure Laterality Date  . dental implants     x3  . gastrocsoleus biopsy    . KNEE SURGERY Left   . lip biopsy    . SURAL NERVE BIOPSY      Family History  Problem Relation Age of Onset  . Migraines Mother   . Cancer Mother   . Aneurysm Father   . Parkinsonism Maternal Grandfather   . Cancer Maternal Grandfather   . Heart disease Maternal Grandfather   . Heart disease Paternal Grandfather   . Stroke Paternal Grandfather     Social history:  reports that she has never smoked. She has never used smokeless tobacco. She reports that she drinks alcohol. She reports that she does not use  drugs.    Allergies  Allergen Reactions  . Doxycycline Hyclate   . Lyrica [Pregabalin]   . Vivelle-Dot [Estradiol]     Adhesive on patch  . Aleve [Naproxen Sodium]     swelling  . Ibuprofen     Medications:  Prior to Admission medications   Medication Sig Start Date End Date Taking? Authorizing Provider  ALPRAZolam Prudy Feeler(XANAX) 0.25 MG tablet Take 0.25 mg by mouth at bedtime as needed for sleep.    Historical Provider, MD  carbamazepine (CARBATROL) 200 MG 12 hr capsule Take 1 capsule (200 mg total) by mouth 2 (two) times daily. 04/09/16   York Spanielharles K Willis, MD  Cholecalciferol (VITAMIN D3) 2000 UNITS TABS Take 2,000 Units by mouth daily.    Historical Provider, MD  cyanocobalamin (,VITAMIN B-12,) 1000 MCG/ML injection INJECT 1 ML INTRAMUSCULARLY EVERY 30 DAYS AS DIRECTED 01/03/16   York Spanielharles K Willis, MD  EVAMIST 1.53 MG/SPRAY transdermal spray Place 1.53 mg onto the skin daily. 09/07/13   Historical Provider, MD  gabapentin (NEURONTIN) 300 MG capsule TAKE 2 CAPSULES (600 MG TOTAL) BY MOUTH DAILY AT BEDTIME AS DIRECTED 09/01/15   York Spanielharles K Willis, MD  HYDROcodone-acetaminophen (NORCO/VICODIN) 5-325 MG tablet Take 1 tablet by mouth every 6 (six) hours as needed. Must last 28 days. 12/09/15   York Spanielharles K Willis, MD  levothyroxine (SYNTHROID, LEVOTHROID) 75 MCG tablet Take 75 mcg by mouth  daily before breakfast.    Historical Provider, MD  Pediatric Multivit-Minerals-C (MULTIVITAMIN GUMMIES CHILDRENS) CHEW Chew 1 tablet by mouth daily.    Historical Provider, MD  progesterone (PROMETRIUM) 200 MG capsule Take 200 mg by mouth. Take 10 caps at bedtime every four months. 03/07/14   Historical Provider, MD  triamcinolone cream (KENALOG) 0.1 % Apply 0.1 g topically as needed.  08/12/13   Historical Provider, MD  VAGIFEM 10 MCG TABS vaginal tablet Place 10 mg vaginally once a week. Take two(2) tablets monthly 02/28/13   Historical Provider, MD    ROS:  Out of a complete 14 system review of symptoms, the patient  complains only of the following symptoms, and all other reviewed systems are negative.  Restless legs Achy muscles, muscle cramps Bruising easily Numbness  There were no vitals taken for this visit.  Physical Exam  General: The patient is alert and cooperative at the time of the examination.  Skin: No significant peripheral edema is noted.   Neurologic Exam  Mental status: The patient is alert and oriented x 3 at the time of the examination. The patient has apparent normal recent and remote memory, with an apparently normal attention span and concentration ability.   Cranial nerves: Facial symmetry is present. Speech is normal, no aphasia or dysarthria is noted. Extraocular movements are full. Visual fields are full.  Motor: The patient has good strength in all 4 extremities.  Sensory examination: Soft touch sensation is symmetric on the face, arms, and legs.  Coordination: The patient has good finger-nose-finger and heel-to-shin bilaterally.  Gait and station: The patient has a normal gait. Tandem gait is normal. Romberg is negative. No drift is seen.  Reflexes: Deep tendon reflexes are symmetric.   Assessment/Plan:  1. Peripheral neuropathy  2. Migraine headache  3. Periodic limb movements  The patient overall is doing relatively well this time. A prescription was given for the hydrocodone and for the gabapentin. The patient will be sent back for physical therapy. She will follow-up in 6 months. The patient wishes to use [facility]. Recent blood work was done that included a liver profile, normal B12 level, and a cholesterol panel. Liver profile and B12 level were unremarkable.  Marlan Palau MD 04/27/2016 8:00 AM  [facility] [address] [place], [place] [address]  Phone [phone] Fax [phone]

## 2016-08-15 ENCOUNTER — Other Ambulatory Visit: Payer: Self-pay | Admitting: Neurology

## 2016-08-15 MED ORDER — HYDROCODONE-ACETAMINOPHEN 5-325 MG PO TABS: 1.0000 | ORAL_TABLET | Freq: Four times a day (QID) | ORAL | 0 refills | Status: DC | PRN

## 2016-08-15 NOTE — Telephone Encounter (Signed)
Pt request refill for HYDROcodone-acetaminophen (NORCO/VICODIN) 5-325 MG tablet °

## 2016-08-15 NOTE — Telephone Encounter (Signed)
Pt was seen for OV in August at which time she received her last rx. She has f/u appt scheduled in March.

## 2016-08-15 NOTE — Telephone Encounter (Signed)
Rx printed, signed, up front for pick-up. 

## 2016-09-14 DIAGNOSIS — R234 Changes in skin texture: Secondary | ICD-10-CM | POA: Diagnosis not present

## 2016-09-14 DIAGNOSIS — E039 Hypothyroidism, unspecified: Secondary | ICD-10-CM | POA: Diagnosis not present

## 2016-09-14 DIAGNOSIS — D509 Iron deficiency anemia, unspecified: Secondary | ICD-10-CM | POA: Diagnosis not present

## 2016-10-03 ENCOUNTER — Telehealth: Payer: Self-pay | Admitting: Neurology

## 2016-10-03 MED ORDER — CYANOCOBALAMIN 1000 MCG/ML IJ SOLN: INTRAMUSCULAR | 0 refills | Status: DC

## 2016-10-03 NOTE — Telephone Encounter (Signed)
Patient requesting refill of cyanocobalamin (,VITAMIN B-12,) 1000 MCG/ML injection 100 ml bottle called to [facility].

## 2016-10-03 NOTE — Addendum Note (Signed)
Addended by: Donnelly AngelicaHOGAN, JENNIFER L on: 10/03/2016 05:08 PM   Modules accepted: Orders

## 2016-10-03 NOTE — Telephone Encounter (Signed)
E-scribed refill of 10 ml vial for 1 ml x 10 mos. Not available in 100 ml vial.

## 2016-11-02 ENCOUNTER — Ambulatory Visit (INDEPENDENT_AMBULATORY_CARE_PROVIDER_SITE_OTHER): Payer: 59 | Admitting: Neurology

## 2016-11-02 ENCOUNTER — Encounter: Payer: Self-pay | Admitting: Neurology

## 2016-11-02 VITALS — BP 133/85 | HR 75 | Ht 66.0 in | Wt 153.0 lb

## 2016-11-02 DIAGNOSIS — G43009 Migraine without aura, not intractable, without status migrainosus: Secondary | ICD-10-CM

## 2016-11-02 DIAGNOSIS — G4761 Periodic limb movement disorder: Secondary | ICD-10-CM

## 2016-11-02 DIAGNOSIS — G609 Hereditary and idiopathic neuropathy, unspecified: Secondary | ICD-10-CM | POA: Diagnosis not present

## 2016-11-02 MED ORDER — SUMATRIPTAN SUCCINATE 100 MG PO TABS: 100.0000 mg | ORAL_TABLET | Freq: Two times a day (BID) | ORAL | 2 refills | Status: DC | PRN

## 2016-11-02 MED ORDER — CYANOCOBALAMIN 1000 MCG/ML IJ SOLN: INTRAMUSCULAR | 1 refills | Status: DC

## 2016-11-02 MED ORDER — HYDROCODONE-ACETAMINOPHEN 5-325 MG PO TABS: 1.0000 | ORAL_TABLET | Freq: Four times a day (QID) | ORAL | 0 refills | Status: DC | PRN

## 2016-11-02 MED ORDER — GABAPENTIN 300 MG PO CAPS: ORAL_CAPSULE | ORAL | 3 refills | Status: DC

## 2016-11-02 NOTE — Progress Notes (Signed)
Reason for visit: Peripheral neuropathy, migraine headache  [name] is an 56 y.o. female  History of present illness:  [name] is a 56 year old right-handed white female with a history of migraine headache. The patient is having headaches a bit more frequently than usual, she is having at least one a month. The patient is using pseudoephedrine and ice packs for the headache, working through the headache. In the past, Imitrex has been helpful but she does not have this prescription. The patient is in general sleeping fairly well, she still has periodic limb movements but this is not disturbing her sleep. The patient does have a peripheral neuropathy, she denies any significant balance issues and difficulty with falls. She uses gabapentin for this, she may take hydrocodone on occasion. The patient returns to this office for an evaluation. She denies any new medical issues that have come up since last seen.  Past Medical History:  Diagnosis Date  . Ankle fracture   . Ankle fracture, left   . Glaucoma    narrow angle  . Migraines   . Myeloperoxidase deficiency (HCC)   . Periodic limb movement disorder 04/08/2015  . Peripheral neuropathy (HCC)    Right  . Raynaud disease 04/24/2013  . Vitamin B12 deficiency     Past Surgical History:  Procedure Laterality Date  . dental implants     x3  . gastrocsoleus biopsy    . KNEE SURGERY Left   . lip biopsy    . SURAL NERVE BIOPSY      Family History  Problem Relation Age of Onset  . Migraines Mother   . Cancer Mother   . Aneurysm Father   . Parkinsonism Maternal Grandfather   . Cancer Maternal Grandfather   . Heart disease Maternal Grandfather   . Heart disease Paternal Grandfather   . Stroke Paternal Grandfather     Social history:  reports that she has never smoked. She has never used smokeless tobacco. She reports that she drinks alcohol. She reports that she does not use drugs.    Allergies  Allergen Reactions  .  Doxycycline Hyclate   . Lyrica [Pregabalin]   . Vivelle-Dot [Estradiol]     Adhesive on patch  . Aleve [Naproxen Sodium]     swelling  . Ibuprofen     Medications:  Prior to Admission medications   Medication Sig Start Date End Date Taking? Authorizing Provider  ALPRAZolam Prudy Feeler) 0.25 MG tablet Take 0.25 mg by mouth at bedtime as needed for sleep.   Yes Historical Provider, MD  carbamazepine (CARBATROL) 200 MG 12 hr capsule Take 1 capsule (200 mg total) by mouth 2 (two) times daily. 04/09/16  Yes York Spaniel, MD  Cholecalciferol (VITAMIN D3) 2000 UNITS TABS Take 2,000 Units by mouth daily.   Yes Historical Provider, MD  cyanocobalamin (,VITAMIN B-12,) 1000 MCG/ML injection INJECT 1 ML INTRAMUSCULARLY EVERY 30 DAYS AS DIRECTED 11/02/16  Yes York Spaniel, MD  EVAMIST 1.53 MG/SPRAY transdermal spray Place 1.53 mg onto the skin daily. 09/07/13  Yes Historical Provider, MD  gabapentin (NEURONTIN) 300 MG capsule TAKE 2 CAPSULES (600 MG TOTAL) BY MOUTH DAILY AT BEDTIME AS DIRECTED 11/02/16  Yes York Spaniel, MD  HYDROcodone-acetaminophen (NORCO/VICODIN) 5-325 MG tablet Take 1 tablet by mouth every 6 (six) hours as needed. Must last 28 days. 11/02/16  Yes York Spaniel, MD  levothyroxine (SYNTHROID, LEVOTHROID) 75 MCG tablet Take 75 mcg by mouth daily before breakfast.   Yes  Historical Provider, MD  Pediatric Multivit-Minerals-C (MULTIVITAMIN GUMMIES CHILDRENS) CHEW Chew 1 tablet by mouth daily.   Yes Historical Provider, MD  progesterone (PROMETRIUM) 200 MG capsule Take 200 mg by mouth. Take 10 caps at bedtime every four months. 03/07/14  Yes Historical Provider, MD  triamcinolone cream (KENALOG) 0.1 % Apply 0.1 g topically as needed.  08/12/13  Yes Historical Provider, MD  VAGIFEM 10 MCG TABS vaginal tablet Place 10 mg vaginally once a week.  02/28/13  Yes Historical Provider, MD  SUMAtriptan (IMITREX) 100 MG tablet Take 1 tablet (100 mg total) by mouth 2 (two) times daily as needed for  migraine. 11/02/16   York Spanielharles K Ayodele Sangalang, MD    ROS:  Out of a complete 14 system review of symptoms, the patient complains only of the following symptoms, and all other reviewed systems are negative.  Ringing in the ears Cough Restless legs  Blood pressure 133/85, pulse 75, height 5\' 6"  (1.676 m), weight 153 lb (69.4 kg).  Physical Exam  General: The patient is alert and cooperative at the time of the examination.  Skin: No significant peripheral edema is noted.   Neurologic Exam  Mental status: The patient is alert and oriented x 3 at the time of the examination. The patient has apparent normal recent and remote memory, with an apparently normal attention span and concentration ability.   Cranial nerves: Facial symmetry is present. Speech is normal, no aphasia or dysarthria is noted. Extraocular movements are full. Visual fields are full.  Motor: The patient has good strength in all 4 extremities.  Sensory examination: Soft touch sensation is symmetric on the face, arms, and legs.  Coordination: The patient has good finger-nose-finger and heel-to-shin bilaterally.  Gait and station: The patient has a normal gait. Tandem gait is normal. Romberg is negative. No drift is seen. The patient is able to walk on heels and the toes bilaterally.  Reflexes: Deep tendon reflexes are symmetric, ankle jerk reflexes are depressed bilaterally.   Assessment/Plan:  1. Migraine headache  2. Peripheral neuropathy  3. Restless leg syndrome, periodic limb movements  The patient is doing relatively well this time, we will add Imitrex to the regimen, she was given a prescription for hydrocodone today. A refill for her gabapentin was given and a prescription for her vitamin B12 was sent in. She will follow-up through this office in one year, sooner if needed. Blood work will be checked on next visit.  Marlan Palau. Keith Tasheem Elms MD 11/02/2016 8:20 AM  [facility] [address] [place] [id number]  Phone [phone] Fax [phone]

## 2016-12-21 DIAGNOSIS — M2550 Pain in unspecified joint: Secondary | ICD-10-CM | POA: Diagnosis not present

## 2016-12-21 DIAGNOSIS — G603 Idiopathic progressive neuropathy: Secondary | ICD-10-CM | POA: Diagnosis not present

## 2016-12-21 DIAGNOSIS — M3500 Sicca syndrome, unspecified: Secondary | ICD-10-CM | POA: Diagnosis not present

## 2017-02-26 ENCOUNTER — Other Ambulatory Visit: Payer: Self-pay | Admitting: Neurology

## 2017-02-26 MED ORDER — HYDROCODONE-ACETAMINOPHEN 5-325 MG PO TABS: 1.0000 | ORAL_TABLET | Freq: Four times a day (QID) | ORAL | 0 refills | Status: DC | PRN

## 2017-02-26 NOTE — Telephone Encounter (Signed)
.  Placed printed/signed rx hydrocodone up front for patient pick up.  

## 2017-03-26 DIAGNOSIS — S61012A Laceration without foreign body of left thumb without damage to nail, initial encounter: Secondary | ICD-10-CM | POA: Diagnosis not present

## 2017-04-12 DIAGNOSIS — Z0000 Encounter for general adult medical examination without abnormal findings: Secondary | ICD-10-CM | POA: Diagnosis not present

## 2017-04-12 DIAGNOSIS — D509 Iron deficiency anemia, unspecified: Secondary | ICD-10-CM | POA: Diagnosis not present

## 2017-04-12 DIAGNOSIS — R040 Epistaxis: Secondary | ICD-10-CM | POA: Diagnosis not present

## 2017-04-12 DIAGNOSIS — E039 Hypothyroidism, unspecified: Secondary | ICD-10-CM | POA: Diagnosis not present

## 2017-04-23 ENCOUNTER — Other Ambulatory Visit: Payer: Self-pay | Admitting: Neurology

## 2017-06-04 ENCOUNTER — Other Ambulatory Visit: Payer: Self-pay | Admitting: Neurology

## 2017-06-05 ENCOUNTER — Other Ambulatory Visit: Payer: Self-pay | Admitting: Neurology

## 2017-06-05 MED ORDER — HYDROCODONE-ACETAMINOPHEN 5-325 MG PO TABS: 1.0000 | ORAL_TABLET | Freq: Four times a day (QID) | ORAL | 0 refills | Status: DC | PRN

## 2017-06-05 NOTE — Telephone Encounter (Signed)
.  Placed printed/signed rx hydrocodone up front for patient pick up.  

## 2017-06-25 ENCOUNTER — Telehealth: Payer: Self-pay | Admitting: Neurology

## 2017-06-25 DIAGNOSIS — M79604 Pain in right leg: Secondary | ICD-10-CM

## 2017-06-25 DIAGNOSIS — M79605 Pain in left leg: Principal | ICD-10-CM

## 2017-06-25 NOTE — Telephone Encounter (Signed)
Pt states she reached out to physical Therapist and was told they never rcvd the order.  Pt is asking that it be refaxed  To them.  Pt provided their fax# to be [phone].  Pt states if she needs to be contacted during work hours call her work# if after work hours please call mobile#

## 2017-06-25 NOTE — Telephone Encounter (Signed)
In the past, the patient has gotten benefit with physical therapy on the back and lower extremities with neuromuscular pain. Physical therapy was requested in August 2017, but the patient never had therapy done. She is now requesting to do therapy.  She requests to have [facility].

## 2017-06-25 NOTE — Addendum Note (Signed)
Addended by: York SpanielWILLIS, Vernice Mannina K on: 06/25/2017 04:52 PM   Modules accepted: Orders

## 2017-06-28 DIAGNOSIS — J302 Other seasonal allergic rhinitis: Secondary | ICD-10-CM | POA: Diagnosis not present

## 2017-06-28 DIAGNOSIS — J342 Deviated nasal septum: Secondary | ICD-10-CM | POA: Insufficient documentation

## 2017-06-28 DIAGNOSIS — R040 Epistaxis: Secondary | ICD-10-CM | POA: Insufficient documentation

## 2017-07-19 DIAGNOSIS — Z01419 Encounter for gynecological examination (general) (routine) without abnormal findings: Secondary | ICD-10-CM | POA: Diagnosis not present

## 2017-07-19 DIAGNOSIS — N951 Menopausal and female climacteric states: Secondary | ICD-10-CM | POA: Diagnosis not present

## 2017-07-19 DIAGNOSIS — Z1231 Encounter for screening mammogram for malignant neoplasm of breast: Secondary | ICD-10-CM | POA: Diagnosis not present

## 2017-07-20 DIAGNOSIS — Z883 Allergy status to other anti-infective agents status: Secondary | ICD-10-CM | POA: Diagnosis not present

## 2017-07-20 DIAGNOSIS — Z79899 Other long term (current) drug therapy: Secondary | ICD-10-CM | POA: Diagnosis not present

## 2017-07-20 DIAGNOSIS — S0105XA Open bite of scalp, initial encounter: Secondary | ICD-10-CM | POA: Diagnosis not present

## 2017-07-20 DIAGNOSIS — S01551A Open bite of lip, initial encounter: Secondary | ICD-10-CM | POA: Diagnosis not present

## 2017-07-20 DIAGNOSIS — G629 Polyneuropathy, unspecified: Secondary | ICD-10-CM | POA: Diagnosis not present

## 2017-07-20 DIAGNOSIS — S01451A Open bite of right cheek and temporomandibular area, initial encounter: Secondary | ICD-10-CM | POA: Diagnosis not present

## 2017-07-24 ENCOUNTER — Other Ambulatory Visit: Payer: Self-pay | Admitting: Obstetrics and Gynecology

## 2017-07-24 DIAGNOSIS — E2839 Other primary ovarian failure: Secondary | ICD-10-CM

## 2017-07-30 DIAGNOSIS — S0100XD Unspecified open wound of scalp, subsequent encounter: Secondary | ICD-10-CM | POA: Diagnosis not present

## 2017-08-28 ENCOUNTER — Ambulatory Visit
Admission: RE | Admit: 2017-08-28 | Discharge: 2017-08-28 | Disposition: A | Discharge status: Home / Self Care | Payer: 59 | Source: Ambulatory Visit | Attending: Obstetrics and Gynecology | Admitting: Obstetrics and Gynecology

## 2017-08-28 DIAGNOSIS — M8589 Other specified disorders of bone density and structure, multiple sites: Secondary | ICD-10-CM | POA: Diagnosis not present

## 2017-08-28 DIAGNOSIS — E2839 Other primary ovarian failure: Secondary | ICD-10-CM

## 2017-08-28 DIAGNOSIS — Z780 Asymptomatic menopausal state: Secondary | ICD-10-CM | POA: Diagnosis not present

## 2017-09-12 ENCOUNTER — Encounter: Payer: Self-pay | Admitting: Neurology

## 2017-09-12 ENCOUNTER — Other Ambulatory Visit: Payer: Self-pay | Admitting: Neurology

## 2017-09-12 MED ORDER — HYDROCODONE-ACETAMINOPHEN 5-325 MG PO TABS: 1.0000 | ORAL_TABLET | Freq: Four times a day (QID) | ORAL | 0 refills | Status: DC | PRN

## 2017-09-12 NOTE — Telephone Encounter (Signed)
Placed printed/signed rx up front for patient pick up.  

## 2017-09-16 DIAGNOSIS — H00034 Abscess of left upper eyelid: Secondary | ICD-10-CM | POA: Diagnosis not present

## 2017-10-14 ENCOUNTER — Encounter: Payer: Self-pay | Admitting: Neurology

## 2017-10-15 ENCOUNTER — Other Ambulatory Visit: Payer: Self-pay | Admitting: Neurology

## 2017-10-15 MED ORDER — SUMATRIPTAN SUCCINATE 6 MG/0.5ML ~~LOC~~ SOLN: 6.0000 mg | Freq: Two times a day (BID) | SUBCUTANEOUS | 3 refills | Status: DC | PRN

## 2017-10-18 DIAGNOSIS — D509 Iron deficiency anemia, unspecified: Secondary | ICD-10-CM | POA: Diagnosis not present

## 2017-10-18 DIAGNOSIS — E039 Hypothyroidism, unspecified: Secondary | ICD-10-CM | POA: Diagnosis not present

## 2017-11-07 ENCOUNTER — Encounter: Payer: Self-pay | Admitting: Neurology

## 2017-11-08 ENCOUNTER — Ambulatory Visit: Payer: 59 | Admitting: Neurology

## 2017-11-11 ENCOUNTER — Encounter: Payer: Self-pay | Admitting: *Deleted

## 2017-11-13 ENCOUNTER — Other Ambulatory Visit: Payer: Self-pay | Admitting: Neurology

## 2017-11-19 ENCOUNTER — Encounter: Payer: Self-pay | Admitting: Neurology

## 2017-11-19 ENCOUNTER — Ambulatory Visit: Payer: 59 | Admitting: Neurology

## 2017-11-19 VITALS — BP 113/81 | HR 77 | Ht 66.0 in | Wt 149.5 lb

## 2017-11-19 DIAGNOSIS — Z5181 Encounter for therapeutic drug level monitoring: Secondary | ICD-10-CM | POA: Diagnosis not present

## 2017-11-19 DIAGNOSIS — G609 Hereditary and idiopathic neuropathy, unspecified: Secondary | ICD-10-CM | POA: Diagnosis not present

## 2017-11-19 DIAGNOSIS — G43009 Migraine without aura, not intractable, without status migrainosus: Secondary | ICD-10-CM

## 2017-11-19 MED ORDER — CARBAMAZEPINE ER 200 MG PO CP12: 200.0000 mg | ORAL_CAPSULE | Freq: Two times a day (BID) | ORAL | 11 refills | Status: DC

## 2017-11-19 MED ORDER — GABAPENTIN 300 MG PO CAPS: ORAL_CAPSULE | ORAL | 11 refills | Status: DC

## 2017-11-19 NOTE — Progress Notes (Signed)
Reason for visit: Migraine headache, peripheral neuropathy  [name] is an 57 y.o. female  History of present illness:  [name] is a 57 year old right-handed white female with a history of migraine headache that averages about once a month.  The headache is sometimes quite severe, she may wake up with a headache and the oral Imitrex is not effective at that point.  The patient does have a peripheral neuropathy.  She has good and bad days with the neuropathy pain.  She is on gabapentin and carbamazepine which seemed to help.  She has no problems with significant gait instability, no falls.  She tolerates medication fairly well.  The patient returns to the office today for an evaluation.  She has recently had a CBC that was unremarkable.  Past Medical History:  Diagnosis Date  . Ankle fracture   . Ankle fracture, left   . Glaucoma    narrow angle  . Migraines   . Myeloperoxidase deficiency (HCC)   . Periodic limb movement disorder 04/08/2015  . Peripheral neuropathy    Right  . Raynaud disease 04/24/2013  . Vitamin B12 deficiency     Past Surgical History:  Procedure Laterality Date  . dental implants     x3  . gastrocsoleus biopsy    . KNEE SURGERY Left   . lip biopsy    . SURAL NERVE BIOPSY      Family History  Problem Relation Age of Onset  . Migraines Mother   . Cancer Mother   . Aneurysm Father   . Parkinsonism Maternal Grandfather   . Cancer Maternal Grandfather   . Heart disease Maternal Grandfather   . Heart disease Paternal Grandfather   . Stroke Paternal Grandfather     Social history:  reports that  has never smoked. she has never used smokeless tobacco. She reports that she drinks alcohol. She reports that she does not use drugs.    Allergies  Allergen Reactions  . Doxycycline Hyclate   . Lyrica [Pregabalin]   . Vivelle-Dot [Estradiol]     Adhesive on patch  . Aleve [Naproxen Sodium]     swelling  . Ibuprofen     Medications:    Prior to Admission medications   Medication Sig Start Date End Date Taking? Authorizing Provider  ALPRAZolam Prudy Feeler(XANAX) 0.25 MG tablet Take 0.25 mg by mouth at bedtime as needed for sleep.   Yes [provider]  carbamazepine (CARBATROL) 200 MG 12 hr capsule Take 1 capsule (200 mg total) by mouth 2 (two) times daily. 11/19/17  Yes York SpanielWillis, Kosha Jaquith K, MD  Cholecalciferol (VITAMIN D3) 2000 UNITS TABS Take 2,000 Units by mouth daily.   Yes [provider]  cyanocobalamin (,VITAMIN B-12,) 1000 MCG/ML injection INJECT 1 ML INTRAMUSCULARLY EVERY 30 DAYS AS DIRECTED 11/02/16  Yes York SpanielWillis, Hebah Bogosian K, MD  EVAMIST 1.53 MG/SPRAY transdermal spray Place 1.53 mg onto the skin daily. 09/07/13  Yes [provider]  gabapentin (NEURONTIN) 300 MG capsule TAKE TWO CAPSULES BY MOUTH EVERY NIGHT AT BEDTIME AS DIRECTED 11/19/17  Yes York SpanielWillis, Dax Murguia K, MD  HYDROcodone-acetaminophen (NORCO/VICODIN) 5-325 MG tablet Take 1 tablet by mouth every 6 (six) hours as needed. Must last 28 days. 09/12/17  Yes York SpanielWillis, Lyrical Sowle K, MD  levothyroxine (SYNTHROID, LEVOTHROID) 75 MCG tablet Take 75 mcg by mouth daily before breakfast.   Yes [provider]  Pediatric Multivit-Minerals-C (MULTIVITAMIN GUMMIES CHILDRENS) CHEW Chew 1 tablet by mouth daily.   Yes [provider]  progesterone (PROMETRIUM) 200 MG capsule Take 200 mg by mouth. Take 5 tablets at bedtime every four months. 03/07/14  Yes [provider]  SUMAtriptan (IMITREX) 100 MG tablet Take 1 tablet (100 mg total) by mouth 2 (two) times daily as needed for migraine. 11/02/16  Yes York Spaniel, MD  SUMAtriptan (IMITREX) 6 MG/0.5ML SOLN injection Inject 0.5 mLs (6 mg total) into the skin 2 (two) times daily as needed for migraine or headache. 10/15/17  Yes York Spaniel, MD  triamcinolone cream (KENALOG) 0.1 % Apply 0.1 g topically as needed.  08/12/13  Yes [provider]  VAGIFEM 10 MCG TABS vaginal tablet Place 10 mg  vaginally once a week.  02/28/13  Yes [provider]    ROS:  Out of a complete 14 system review of symptoms, the patient complains only of the following symptoms, and all other reviewed systems are negative.  Restless legs Environmental allergies Muscle cramps, walking difficulty Anemia Numbness  Blood pressure 113/81, pulse 77, height 5\' 6"  (1.676 m), weight 149 lb 8 oz (67.8 kg).  Physical Exam  General: The patient is alert and cooperative at the time of the examination.  Skin: No significant peripheral edema is noted.   Neurologic Exam  Mental status: The patient is alert and oriented x 3 at the time of the examination. The patient has apparent normal recent and remote memory, with an apparently normal attention span and concentration ability.   Cranial nerves: Facial symmetry is present. Speech is normal, no aphasia or dysarthria is noted. Extraocular movements are full. Visual fields are full.  Motor: The patient has good strength in all 4 extremities.  Sensory examination: Soft touch sensation is symmetric on the face, arms, and legs, the patient reports decreased sensation below the knees bilaterally.  Coordination: The patient has good finger-nose-finger and heel-to-shin bilaterally.  Gait and station: The patient has a normal gait. Tandem gait is normal. Romberg is negative. No drift is seen.  Reflexes: Deep tendon reflexes are symmetric.   Assessment/Plan:  1.  Migraine headache  2.  Peripheral neuropathy  The patient will continue the gabapentin and carbamazepine, prescriptions for these medications were sent in.  The patient will have blood work done today.  The patient will go on the Imitrex injections, this may work better for her severe headaches.  She will follow-up in 1 year, sooner if needed.  Marlan Palau MD 11/19/2017 4:15 PM  [facility] [address] [place], [place] [address]  Phone  [phone] Fax [phone]

## 2017-11-20 LAB — COMPREHENSIVE METABOLIC PANEL
ALBUMIN: 4.2 g/dL (ref 3.5–5.5)
ALK PHOS: 60 IU/L (ref 39–117)
ALT: 31 IU/L (ref 0–32)
AST: 25 IU/L (ref 0–40)
Albumin/Globulin Ratio: 1.6 (ref 1.2–2.2)
BUN / CREAT RATIO: 14 (ref 9–23)
BUN: 11 mg/dL (ref 6–24)
CHLORIDE: 104 mmol/L (ref 96–106)
CO2: 25 mmol/L (ref 20–29)
CREATININE: 0.79 mg/dL (ref 0.57–1.00)
Calcium: 9.2 mg/dL (ref 8.7–10.2)
GFR calc Af Amer: 97 mL/min/{1.73_m2} (ref >59)
GFR calc non Af Amer: 85 mL/min/{1.73_m2} (ref >59)
GLUCOSE: 86 mg/dL (ref 65–99)
Globulin, Total: 2.6 g/dL (ref 1.5–4.5)
Potassium: 5 mmol/L (ref 3.5–5.2)
Sodium: 142 mmol/L (ref 134–144)
Total Protein: 6.8 g/dL (ref 6.0–8.5)

## 2017-11-20 LAB — HEPATITIS C ANTIBODY

## 2017-11-20 LAB — CARBAMAZEPINE LEVEL, TOTAL: CARBAMAZEPINE LVL: 3.2 ug/mL — AB (ref 4.0–12.0)

## 2017-12-19 DIAGNOSIS — R300 Dysuria: Secondary | ICD-10-CM | POA: Diagnosis not present

## 2017-12-27 DIAGNOSIS — M3500 Sicca syndrome, unspecified: Secondary | ICD-10-CM | POA: Diagnosis not present

## 2017-12-27 DIAGNOSIS — M2550 Pain in unspecified joint: Secondary | ICD-10-CM | POA: Diagnosis not present

## 2017-12-27 DIAGNOSIS — G603 Idiopathic progressive neuropathy: Secondary | ICD-10-CM | POA: Diagnosis not present

## 2018-03-31 ENCOUNTER — Telehealth: Payer: Self-pay | Admitting: *Deleted

## 2018-03-31 ENCOUNTER — Other Ambulatory Visit: Payer: Self-pay | Admitting: Neurology

## 2018-03-31 MED ORDER — HYDROCODONE-ACETAMINOPHEN 5-325 MG PO TABS: 1.0000 | ORAL_TABLET | Freq: Four times a day (QID) | ORAL | 0 refills | Status: DC | PRN

## 2018-03-31 NOTE — Telephone Encounter (Signed)
PA initiated on covermymeds. Key: [id number] - PA Case ID: [id number] - Rx #: [id number]: [id number]. Waiting on office notes to be attached to then submit PA.   Pt stopped in office and spoke with front desk today. It was explained to her that PA needed via insurance. Front desk advised her I would call her to give her update.  Pt reports PA is and never needed. However this was clarified with patient that PA is needed via insurance.

## 2018-04-01 NOTE — Telephone Encounter (Signed)
Pa submitted. Waiting on determination.  "[employer] is reviewing your PA request. Typically an electronic response will be received within 72 hours"

## 2018-04-02 ENCOUNTER — Encounter: Payer: Self-pay | Admitting: Neurology

## 2018-04-02 NOTE — Telephone Encounter (Signed)
Checked on status of PA, still pending

## 2018-04-02 NOTE — Telephone Encounter (Signed)
I called [employer] earlier and we should have determination tomorrow. Pt aware.

## 2018-04-03 NOTE — Telephone Encounter (Signed)
I called [employer] to check on status of PA. PA is still pending. I inquired how we can reach a quicker determination since it has been a few days since submitted. They are going to escalate PA so we will have determination within an hour. Rep placed me on hold to try and work this out

## 2018-04-03 NOTE — Telephone Encounter (Signed)
Sent pt mychart message with update: "Hi [name]- I just wanted to give you an update about your hydrocodone. I just got off the phone with your insurance to check on the status of the prior authorization. At first they told me we would have an answer today but now they told me it could take up to 14 days to have an answer. I have never heard of this so I questioned this. I was able to get them to resubmit it as urgent. They should have answer within an hour today.   I am sorry for the delay. I am not sure what the hold up is on your insurance. I am hoping that by submitting it as urgent now this will get it approved quickly.   Thank you,  Kara Meadmma"

## 2018-04-03 NOTE — Telephone Encounter (Signed)
Pa approved until 05/01/18 [facility].

## 2018-04-18 DIAGNOSIS — M3501 Sicca syndrome with keratoconjunctivitis: Secondary | ICD-10-CM | POA: Diagnosis not present

## 2018-04-18 DIAGNOSIS — E039 Hypothyroidism, unspecified: Secondary | ICD-10-CM | POA: Diagnosis not present

## 2018-04-18 DIAGNOSIS — Z0000 Encounter for general adult medical examination without abnormal findings: Secondary | ICD-10-CM | POA: Diagnosis not present

## 2018-04-18 DIAGNOSIS — E538 Deficiency of other specified B group vitamins: Secondary | ICD-10-CM | POA: Diagnosis not present

## 2018-04-25 DIAGNOSIS — H40013 Open angle with borderline findings, low risk, bilateral: Secondary | ICD-10-CM | POA: Diagnosis not present

## 2018-05-16 DIAGNOSIS — R300 Dysuria: Secondary | ICD-10-CM | POA: Diagnosis not present

## 2018-05-23 DIAGNOSIS — H40013 Open angle with borderline findings, low risk, bilateral: Secondary | ICD-10-CM | POA: Diagnosis not present

## 2018-07-02 ENCOUNTER — Other Ambulatory Visit: Payer: Self-pay | Admitting: Neurology

## 2018-07-09 MED ORDER — SUMATRIPTAN SUCCINATE 100 MG PO TABS: 100.0000 mg | ORAL_TABLET | Freq: Two times a day (BID) | ORAL | 2 refills | Status: DC | PRN

## 2018-08-13 ENCOUNTER — Other Ambulatory Visit: Payer: Self-pay

## 2018-08-13 ENCOUNTER — Other Ambulatory Visit: Payer: Self-pay | Admitting: *Deleted

## 2018-08-13 MED ORDER — HYDROCODONE-ACETAMINOPHEN 5-325 MG PO TABS: 1.0000 | ORAL_TABLET | Freq: Four times a day (QID) | ORAL | 0 refills | Status: DC | PRN

## 2018-08-13 NOTE — Progress Notes (Signed)
The prescription for hydrocodone was sent in. 

## 2018-08-22 ENCOUNTER — Other Ambulatory Visit: Payer: Self-pay

## 2018-08-22 MED ORDER — CYANOCOBALAMIN 1000 MCG/ML IJ SOLN: INTRAMUSCULAR | 1 refills | Status: DC

## 2018-11-21 ENCOUNTER — Other Ambulatory Visit: Payer: Self-pay | Admitting: Neurology

## 2018-11-27 ENCOUNTER — Telehealth: Payer: Self-pay | Admitting: Neurology

## 2018-11-27 NOTE — Telephone Encounter (Signed)
Called and spoke directly with patient and she is happy and comfortable with telephone visit with Dr. Anne Hahn. Patient gave consent and verbalized understanding of privacy and security. Patient would like to be called at the mobile number listed in her chart. [phone].)

## 2018-11-27 NOTE — Telephone Encounter (Signed)
I requested a call back from the pt to discuss medications, allergies and pharmacy before her telephone visit for 11/28/18 at 11:00 am.

## 2018-11-28 ENCOUNTER — Other Ambulatory Visit: Payer: Self-pay

## 2018-11-28 ENCOUNTER — Encounter: Payer: Self-pay | Admitting: Neurology

## 2018-11-28 ENCOUNTER — Ambulatory Visit (INDEPENDENT_AMBULATORY_CARE_PROVIDER_SITE_OTHER): Payer: 59 | Admitting: Neurology

## 2018-11-28 DIAGNOSIS — Z5181 Encounter for therapeutic drug level monitoring: Secondary | ICD-10-CM | POA: Diagnosis not present

## 2018-11-28 DIAGNOSIS — G43009 Migraine without aura, not intractable, without status migrainosus: Secondary | ICD-10-CM

## 2018-11-28 DIAGNOSIS — G609 Hereditary and idiopathic neuropathy, unspecified: Secondary | ICD-10-CM

## 2018-11-28 MED ORDER — GABAPENTIN 300 MG PO CAPS: ORAL_CAPSULE | ORAL | 3 refills | Status: DC

## 2018-11-28 MED ORDER — CARBAMAZEPINE ER 200 MG PO CP12: 200.0000 mg | ORAL_CAPSULE | Freq: Two times a day (BID) | ORAL | 3 refills | Status: DC

## 2018-11-28 MED ORDER — HYDROCODONE-ACETAMINOPHEN 5-325 MG PO TABS: 1.0000 | ORAL_TABLET | Freq: Four times a day (QID) | ORAL | 0 refills | Status: DC | PRN

## 2018-11-28 NOTE — Progress Notes (Signed)
Virtual Visit via Telephone Note  I connected with [name] on 11/28/18 at 11:00 AM EDT by telephone and verified that I am speaking with the correct person using two identifiers.   I discussed the limitations, risks, security and privacy concerns of performing an evaluation and management service by telephone and the availability of in person appointments. I also discussed with the patient that there may be a patient responsible charge related to this service. The patient expressed understanding and agreed to proceed.   History of Present Illness: [name] is a 58 year old patient with a history of migraine headaches.  She has done relatively well recently with her migraines, she has not had a headache in over 6 weeks.  She takes Imitrex tablets or shots if needed, she may occasionally wake up with a headache and these are more severe and difficult to control, and Imitrex injections are required.  She has only missed 1 day of work because of headache in the last year.  She is pleased with her headache control currently.  She has a history of a peripheral neuropathy, she has some increased numbness below the knees with the neuropathy, she occasionally may require hydrocodone at night to help her rest.  She is on Carbatrol taking 200 mg twice daily and gabapentin 6 1 mg in the evening.  She cannot take gabapentin during the day and work.  Her feet start bothering her when she gets home from work and she may have 1 or 2 days during the week where she cannot sleep well because of the severe neuropathy pain.  The patient lacerated her head 3 weeks ago requiring staples, otherwise no new medical issues have come up.  She denies any severe gait instability problems.   Observations/Objective: On the telephone interview, the patient appears to be alert and cooperative, responding appropriately.  Speech is well enunciated, not aphasic or dysarthric.  She claims that her blood pressure today was  118/74, weight is 153 pounds.  Assessment and Plan: 1.  Migraine headache  2.  Peripheral neuropathy  3.  B12 deficiency  The patient is doing fairly well with her migraines currently but her peripheral neuropathy pain is not well controlled.  We will go up on the gabapentin taking 300 mg at 5 PM when she returns from work, and 600 mg in the evening.  She will continue her carbamazepine taking 200 mg twice daily, we may be able to go up on this dosing if needed in the future.  She will come in for blood work to check a comprehensive metabolic profile, CBC and differential, and carbamazepine level.  Prescriptions were sent in for the hydrocodone, carbamazepine, and gabapentin.  She will follow-up here in 6 months.  Follow Up Instructions:    I discussed the assessment and treatment plan with the patient. The patient was provided an opportunity to ask questions and all were answered. The patient agreed with the plan and demonstrated an understanding of the instructions.   The patient was advised to call back or seek an in-person evaluation if the symptoms worsen or if the condition fails to improve as anticipated.  I provided 25 minutes of non-face-to-face time during this encounter.   York Spaniel, MD

## 2018-12-04 ENCOUNTER — Other Ambulatory Visit: Payer: Self-pay

## 2018-12-04 ENCOUNTER — Other Ambulatory Visit (INDEPENDENT_AMBULATORY_CARE_PROVIDER_SITE_OTHER): Payer: Self-pay

## 2018-12-04 DIAGNOSIS — Z5181 Encounter for therapeutic drug level monitoring: Secondary | ICD-10-CM

## 2018-12-04 DIAGNOSIS — Z0289 Encounter for other administrative examinations: Secondary | ICD-10-CM

## 2018-12-05 LAB — COMPREHENSIVE METABOLIC PANEL
ALT: 22 IU/L (ref 0–32)
AST: 28 IU/L (ref 0–40)
Albumin/Globulin Ratio: 1.8 (ref 1.2–2.2)
Albumin: 4.6 g/dL (ref 3.8–4.9)
Alkaline Phosphatase: 54 IU/L (ref 39–117)
BUN/Creatinine Ratio: 9 (ref 9–23)
BUN: 8 mg/dL (ref 6–24)
Bilirubin Total: 0.2 mg/dL (ref 0.0–1.2)
CO2: 28 mmol/L (ref 20–29)
Calcium: 9.4 mg/dL (ref 8.7–10.2)
Chloride: 100 mmol/L (ref 96–106)
Creatinine, Ser: 0.86 mg/dL (ref 0.57–1.00)
GFR calc Af Amer: 87 mL/min/{1.73_m2} (ref >59)
GFR calc non Af Amer: 76 mL/min/{1.73_m2} (ref >59)
Globulin, Total: 2.6 g/dL (ref 1.5–4.5)
Glucose: 80 mg/dL (ref 65–99)
Potassium: 4.7 mmol/L (ref 3.5–5.2)
Sodium: 140 mmol/L (ref 134–144)
Total Protein: 7.2 g/dL (ref 6.0–8.5)

## 2018-12-05 LAB — CBC WITH DIFFERENTIAL/PLATELET
Basophils Absolute: 0.1 10*3/uL (ref 0.0–0.2)
Basos: 2 %
EOS (ABSOLUTE): 0.1 10*3/uL (ref 0.0–0.4)
Eos: 1 %
Hematocrit: 42.2 % (ref 34.0–46.6)
Hemoglobin: 14.3 g/dL (ref 11.1–15.9)
Immature Grans (Abs): 0 10*3/uL (ref 0.0–0.1)
Immature Granulocytes: 0 %
Lymphocytes Absolute: 2.6 10*3/uL (ref 0.7–3.1)
Lymphs: 44 %
MCH: 31.6 pg (ref 26.6–33.0)
MCHC: 33.9 g/dL (ref 31.5–35.7)
MCV: 93 fL (ref 79–97)
Monocytes Absolute: 0.6 10*3/uL (ref 0.1–0.9)
Monocytes: 10 %
Neutrophils Absolute: 2.5 10*3/uL (ref 1.4–7.0)
Neutrophils: 43 %
Platelets: 295 10*3/uL (ref 150–450)
RBC: 4.52 x10E6/uL (ref 3.77–5.28)
RDW: 12.1 % (ref 11.7–15.4)
WBC: 5.9 10*3/uL (ref 3.4–10.8)

## 2018-12-05 LAB — CARBAMAZEPINE LEVEL, TOTAL: Carbamazepine (Tegretol), S: 4.8 ug/mL (ref 4.0–12.0)

## 2019-05-12 ENCOUNTER — Telehealth: Payer: Self-pay | Admitting: Neurology

## 2019-05-12 NOTE — Telephone Encounter (Signed)
I have called this patient regarding rescheduling their 06/05/19 appointment due to office being closed this day. Requested patient call back to reschedule. °

## 2019-06-05 ENCOUNTER — Ambulatory Visit: Payer: 59 | Admitting: Neurology

## 2019-06-23 NOTE — Progress Notes (Signed)
PATIENT: [name] DOB: [birth year]  REASON FOR VISIT: follow up HISTORY FROM: patient  HISTORY OF PRESENT ILLNESS: Today 06/24/19  [name] is a 58 year old female with history of migraines and peripheral neuropathy.  She indicates her migraines are under good control, she rarely has to take Imitrex.  She has Imitrex tablets, and Imitrex injections for prolonged headaches.  She has peripheral neuropathy pain in her feet and lower legs, that is bothersome when she gets home from work.  She is a Development worker, community.  She remains on Carbatrol 200 mg twice daily, gabapentin 300 mg at 5 PM and 600 mg at bedtime.  She will take hydrocodone occasionally at night to help her rest.  She has not had any recent falls.  She does have history of low B12, that our office has been prescribing B12 injections.  Her primary care doctor has been managing her B12 levels.  She had a recent level checked in September that was 478.  She indicates her overall health has been well and that her pain is adequately controlled.  She presents today for follow-up unaccompanied.  HISTORY 11/28/2018 Dr. Jannifer Franklin: [name] is a 58 year old patient with a history of migraine headaches.  She has done relatively well recently with her migraines, she has not had a headache in over 6 weeks.  She takes Imitrex tablets or shots if needed, she may occasionally wake up with a headache and these are more severe and difficult to control, and Imitrex injections are required.  She has only missed 1 day of work because of headache in the last year.  She is pleased with her headache control currently.  She has a history of a peripheral neuropathy, she has some increased numbness below the knees with the neuropathy, she occasionally may require hydrocodone at night to help her rest.  She is on Carbatrol taking 200 mg twice daily and gabapentin 6 1 mg in the evening.  She cannot take gabapentin during the day and work.  Her feet  start bothering her when she gets home from work and she may have 1 or 2 days during the week where she cannot sleep well because of the severe neuropathy pain.  The patient lacerated her head 3 weeks ago requiring staples, otherwise no new medical issues have come up.  She denies any severe gait instability problems.  REVIEW OF SYSTEMS: Out of a complete 14 system review of symptoms, the patient complains only of the following symptoms, and all other reviewed systems are negative.  ALLERGIES: Allergies  Allergen Reactions  . Doxycycline Hyclate   . Lyrica [Pregabalin]   . Vivelle-Dot [Estradiol]     Adhesive on patch  . Aleve [Naproxen Sodium]     swelling  . Ibuprofen     HOME MEDICATIONS: Outpatient Medications Prior to Visit  Medication Sig Dispense Refill  . ALPRAZolam (XANAX) 0.25 MG tablet Take 0.25 mg by mouth at bedtime as needed for sleep.    . carbamazepine (CARBATROL) 200 MG 12 hr capsule Take 1 capsule (200 mg total) by mouth 2 (two) times daily. 180 capsule 3  . Cholecalciferol (VITAMIN D3) 2000 UNITS TABS Take 2,000 Units by mouth daily.    . cyanocobalamin (,VITAMIN B-12,) 1000 MCG/ML injection INJECT 1 ML INTRAMUSCULARLY EVERY 30 DAYS AS DIRECTED 10 mL 1  . EVAMIST 1.53 MG/SPRAY transdermal spray Place 1.53 mg onto the skin daily.    Marland Kitchen gabapentin (NEURONTIN) 300 MG capsule 1 capsule at 5 PM,  2 capsules at bedtime 270 capsule 3  . HYDROcodone-acetaminophen (NORCO/VICODIN) 5-325 MG tablet Take 1 tablet by mouth every 6 (six) hours as needed. Must last 28 days. 120 tablet 0  . levothyroxine (SYNTHROID, LEVOTHROID) 75 MCG tablet Take 75 mcg by mouth daily before breakfast.    . Pediatric Multivit-Minerals-C (MULTIVITAMIN GUMMIES CHILDRENS) CHEW Chew 1 tablet by mouth daily.    . progesterone (PROMETRIUM) 200 MG capsule Take 200 mg by mouth. Take 5 tablets at bedtime every four months.    . SUMAtriptan (IMITREX) 100 MG tablet Take 1 tablet (100 mg total) by mouth 2 (two)  times daily as needed for up to 1 dose for migraine. 10 tablet 2  . SUMAtriptan (IMITREX) 6 MG/0.5ML SOLN injection Inject 0.5 mLs (6 mg total) into the skin 2 (two) times daily as needed for migraine or headache. 6 vial 3  . triamcinolone cream (KENALOG) 0.1 % Apply 0.1 g topically as needed.     Marland Kitchen. VAGIFEM 10 MCG TABS vaginal tablet Place 10 mg vaginally once a week.      No facility-administered medications prior to visit.     PAST MEDICAL HISTORY: Past Medical History:  Diagnosis Date  . Ankle fracture   . Ankle fracture, left   . Glaucoma    narrow angle  . Migraines   . Myeloperoxidase deficiency (HCC)   . Periodic limb movement disorder 04/08/2015  . Peripheral neuropathy    Right  . Raynaud disease 04/24/2013  . Vitamin B12 deficiency     PAST SURGICAL HISTORY: Past Surgical History:  Procedure Laterality Date  . dental implants     x3  . gastrocsoleus biopsy    . KNEE SURGERY Left   . lip biopsy    . SURAL NERVE BIOPSY      FAMILY HISTORY: Family History  Problem Relation Age of Onset  . Migraines Mother   . Cancer Mother   . Aneurysm Father   . Parkinsonism Maternal Grandfather   . Cancer Maternal Grandfather   . Heart disease Maternal Grandfather   . Heart disease Paternal Grandfather   . Stroke Paternal Grandfather     SOCIAL HISTORY: Social History   Socioeconomic History  . Marital status: Married    Spouse name: Ed  . Number of children: 0  . Years of education: 6316  . Highest education level: Not on file  Occupational History  . Occupation: Print production plannerffice Manager  Social Needs  . Financial resource strain: Not on file  . Food insecurity    Worry: Not on file    Inability: Not on file  . Transportation needs    Medical: Not on file    Non-medical: Not on file  Tobacco Use  . Smoking status: Never Smoker  . Smokeless tobacco: Never Used  Substance and Sexual Activity  . Alcohol use: Yes    Alcohol/week: 0.0 standard drinks    Comment:  occasionally  . Drug use: No  . Sexual activity: Not on file  Lifestyle  . Physical activity    Days per week: Not on file    Minutes per session: Not on file  . Stress: Not on file  Relationships  . Social Musicianconnections    Talks on phone: Not on file    Gets together: Not on file    Attends religious service: Not on file    Active member of club or organization: Not on file    Attends meetings of clubs or organizations: Not on file  Relationship status: Not on file  . Intimate partner violence    Fear of current or ex partner: Not on file    Emotionally abused: Not on file    Physically abused: Not on file    Forced sexual activity: Not on file  Other Topics Concern  . Not on file  Social History Narrative   Lives at home, married   Patient drinks about 2 cups of caffeine daily.   Patient is right handed.      PHYSICAL EXAM  Vitals:   06/24/19 1508  BP: 119/83  Pulse: 81  Temp: 98.3 F (36.8 C)  Weight: 158 lb 9.6 oz (71.9 kg)  Height:  (1.676 m)   Body mass index is 25.6 kg/m.  Generalized: Well developed, in no acute distress   Neurological examination  Mentation: Alert oriented to time, place, history taking. Follows all commands speech and language fluent Cranial nerve II-XII: Pupils were equal round reactive to light. Extraocular movements were full, visual field were full on confrontational test. Facial sensation and strength were normal. Uvula tongue midline. Head turning and shoulder shrug  were normal and symmetric. Motor: The motor testing reveals 5 over 5 strength of all 4 extremities. Good symmetric motor tone is noted throughout.  Sensory: Sensory testing is intact to soft touch on all 4 extremities. No evidence of extinction is noted.  Coordination: Cerebellar testing reveals good finger-nose-finger and heel-to-shin bilaterally.  Gait and station: Gait is normal. Tandem gait is normal. Romberg is negative. No drift is seen.  Reflexes: Deep  tendon reflexes are symmetric and normal bilaterally.   DIAGNOSTIC DATA (LABS, IMAGING, TESTING) - I reviewed patient records, labs, notes, testing and imaging myself where available.  Lab Results  Component Value Date   WBC 5.9 12/04/2018   HGB 14.3 12/04/2018   HCT 42.2 12/04/2018   MCV 93 12/04/2018   PLT 295 12/04/2018      Component Value Date/Time   NA 140 12/04/2018 1542   K 4.7 12/04/2018 1542   CL 100 12/04/2018 1542   CO2 28 12/04/2018 1542   GLUCOSE 80 12/04/2018 1542   GLUCOSE 98 10/28/2008 1454   BUN 8 12/04/2018 1542   CREATININE 0.86 12/04/2018 1542   CALCIUM 9.4 12/04/2018 1542   PROT 7.2 12/04/2018 1542   ALBUMIN 4.6 12/04/2018 1542   AST 28 12/04/2018 1542   ALT 22 12/04/2018 1542   ALKPHOS 54 12/04/2018 1542   BILITOT 0.2 12/04/2018 1542   GFRNONAA 76 12/04/2018 1542   GFRAA 87 12/04/2018 1542   No results found for: CHOL, HDL, LDLCALC, LDLDIRECT, TRIG, CHOLHDL No results found for: ZOXW9U No results found for: VITAMINB12 Lab Results  Component Value Date   TSH 10.760 (H) 04/24/2013    ASSESSMENT AND PLAN 58 y.o. year old female  has a past medical history of Ankle fracture, Ankle fracture, left, Glaucoma, Migraines, Myeloperoxidase deficiency (HCC), Periodic limb movement disorder (04/08/2015), Peripheral neuropathy, Raynaud disease (04/24/2013), and Vitamin B12 deficiency. here with:  1.  Migraine headaches 2.  Peripheral neuropathy  Overall, she has continued to do quite well.  Her current medication regimen is working adequately to control her pain.  She will continue taking gabapentin 300 mg at 5 PM, 600 mg at bedtime, along with Carbatrol 200 mg twice a day.  She had laboratory evaluation in April 2020 that was unremarkable.  Carbamazepine level was low therapeutic.  She has continued taking hydrocodone at night to be able to rest when her  neuropathy pain is bothersome.  I will refill her vitamin B12 injections that have been coming from our  office.  Her primary doctor follows B12 labs, recent was 478. She will follow-up in 6 months or sooner if needed.  I did advise that if her symptoms worsen or she develops any new symptoms she should let us know.  I spent 15 minutes with the patient. 50% of this time was spent discussing the plan of care.   Margie Ege, AGNP-C, DNP 06/24/2019, 3:20 PM [facility] [address] [place], [place] [address] [phone]

## 2019-06-24 ENCOUNTER — Encounter: Payer: Self-pay | Admitting: Neurology

## 2019-06-24 ENCOUNTER — Other Ambulatory Visit: Payer: Self-pay

## 2019-06-24 ENCOUNTER — Ambulatory Visit (INDEPENDENT_AMBULATORY_CARE_PROVIDER_SITE_OTHER): Payer: 59 | Admitting: Neurology

## 2019-06-24 VITALS — BP 119/83 | HR 81 | Temp 98.3°F | Ht 66.0 in | Wt 158.6 lb

## 2019-06-24 DIAGNOSIS — G609 Hereditary and idiopathic neuropathy, unspecified: Secondary | ICD-10-CM | POA: Diagnosis not present

## 2019-06-24 DIAGNOSIS — G43009 Migraine without aura, not intractable, without status migrainosus: Secondary | ICD-10-CM

## 2019-06-24 MED ORDER — CYANOCOBALAMIN 1000 MCG/ML IJ SOLN: INTRAMUSCULAR | 1 refills | Status: AC

## 2019-06-24 NOTE — Progress Notes (Signed)
I have read the note, and I agree with the clinical assessment and plan.  Hilton Saephan K Yoskar Murrillo   

## 2019-06-24 NOTE — Patient Instructions (Signed)
1. Continue current medications  2. Return in 6 months  

## 2019-06-25 ENCOUNTER — Telehealth: Payer: Self-pay

## 2019-06-25 MED ORDER — HYDROCODONE-ACETAMINOPHEN 5-325 MG PO TABS: 1.0000 | ORAL_TABLET | Freq: Four times a day (QID) | ORAL | 0 refills | Status: DC | PRN

## 2019-06-25 NOTE — Telephone Encounter (Signed)
Pt was seen by SS, NP on 06/24/2019. During the visit pt requested a refill of hydrocodone.  Last refill was 12/04/2018 # 120 for a 30 day supply. Pt has been compliant with f/u's.

## 2019-06-25 NOTE — Addendum Note (Signed)
Addended by: Kathrynn Ducking on: 06/25/2019 12:08 PM   Modules accepted: Orders

## 2019-06-25 NOTE — Telephone Encounter (Signed)
The prescription for hydrocodone will be refilled. 

## 2019-07-01 ENCOUNTER — Encounter: Payer: Self-pay | Admitting: Neurology

## 2019-07-02 NOTE — Telephone Encounter (Signed)
After speaking to pharmacist, and pt given 7 days of the last prescription Hydrocodone #28 the other 94 tabs will be voided on the prescription.  She will need another prescription per [facility].  Do you want to do this now or wait until 7 days up?  Per drug registry has not picked up yet.

## 2019-07-14 ENCOUNTER — Other Ambulatory Visit: Payer: Self-pay | Admitting: Neurology

## 2019-07-14 MED ORDER — HYDROCODONE-ACETAMINOPHEN 5-325 MG PO TABS: 1.0000 | ORAL_TABLET | Freq: Four times a day (QID) | ORAL | 0 refills | Status: DC | PRN

## 2019-07-14 NOTE — Progress Notes (Signed)
The original prescription was written for 120 tablets, the patient only got 30 tablets through the pharmacy, I have rewritten another prescription for 90 tablets for the patient.

## 2019-09-30 ENCOUNTER — Encounter (HOSPITAL_BASED_OUTPATIENT_CLINIC_OR_DEPARTMENT_OTHER): Payer: Worker's Compensation | Attending: Physician Assistant | Admitting: Physician Assistant

## 2019-09-30 ENCOUNTER — Other Ambulatory Visit: Payer: Self-pay

## 2019-09-30 DIAGNOSIS — M3500 Sicca syndrome, unspecified: Secondary | ICD-10-CM | POA: Insufficient documentation

## 2019-09-30 DIAGNOSIS — I872 Venous insufficiency (chronic) (peripheral): Secondary | ICD-10-CM | POA: Diagnosis not present

## 2019-09-30 DIAGNOSIS — L97822 Non-pressure chronic ulcer of other part of left lower leg with fat layer exposed: Secondary | ICD-10-CM | POA: Insufficient documentation

## 2019-09-30 DIAGNOSIS — G609 Hereditary and idiopathic neuropathy, unspecified: Secondary | ICD-10-CM | POA: Insufficient documentation

## 2019-09-30 DIAGNOSIS — E079 Disorder of thyroid, unspecified: Secondary | ICD-10-CM | POA: Diagnosis not present

## 2019-09-30 DIAGNOSIS — L97828 Non-pressure chronic ulcer of other part of left lower leg with other specified severity: Secondary | ICD-10-CM | POA: Insufficient documentation

## 2019-09-30 DIAGNOSIS — I890 Lymphedema, not elsewhere classified: Secondary | ICD-10-CM | POA: Insufficient documentation

## 2019-09-30 DIAGNOSIS — M3501 Sicca syndrome with keratoconjunctivitis: Secondary | ICD-10-CM | POA: Insufficient documentation

## 2019-09-30 DIAGNOSIS — I7300 Raynaud's syndrome without gangrene: Secondary | ICD-10-CM | POA: Insufficient documentation

## 2019-09-30 NOTE — Progress Notes (Signed)
[name], Cheyenne B. ([id number]) Visit Report for 09/30/2019 Chief Complaint Document Details Patient Name: Date of Service: [name], Cheyenne B. 09/30/2019 2:45 PM Medical Record [id number] Patient Account Number: [id number] Date of Birth/Sex: Treating RN: 06-04-1961 (59 y.o. Female) Primary Care Provider: Docia ChuckKoirala, Dibas Other Clinician: Referring Provider: Treating Provider/Extender:Stone III, Lizabeth LeydenHoyt Koirala, Dibas Weeks in Treatment: 0 Information Obtained from: Patient Chief Complaint Left LE Ulcers Electronic Signature(s) Signed: 09/30/2019 3:47:01 PM By: Lenda KelpStone III, Arleen Bar PA-C Entered By: Lenda KelpStone III, Kieara Schwark on 09/30/2019 15:46:58 -------------------------------------------------------------------------------- Debridement Details Patient Name: Date of Service: [name], Cheyenne B. 09/30/2019 2:45 PM Medical Record [id number] Patient Account Number: [id number] Date of Birth/Sex: 06-04-1961 (58 y.o. Female) Treating RN: Zenaida DeedBoehlein, Linda Primary Care Provider: Docia ChuckKoirala, Dibas Other Clinician: Referring Provider: Treating Provider/Extender:Stone III, Lizabeth LeydenHoyt Koirala, Dibas Weeks in Treatment: 0 Debridement Performed for Wound #2 Left,Anterior Lower Leg Assessment: Performed By: Physician Lenda KelpStone III, Dimitrious Micciche, PA Debridement Type: Debridement Severity of Tissue Pre Fat layer exposed Debridement: Level of Consciousness (Pre- Awake and Alert procedure): Pre-procedure Verification/Time Out Taken: Yes - 15:55 Start Time: 15:56 Pain Control: Lidocaine 5% topical ointment Total Area Debrided (L x W): 5.4 (cm) x 4.3 (cm) = 23.22 (cm) Tissue and other material Viable, Non-Viable, Slough, Subcutaneous, Slough debrided: Level: Skin/Subcutaneous Tissue Debridement Description: Excisional Instrument: Curette Bleeding: Minimum Hemostasis Achieved: Pressure End Time: 16:00 Procedural Pain: 6 Post Procedural Pain: 0 Response to Treatment: Procedure was tolerated well Level of Consciousness  Awake and Alert (Post-procedure): Post Debridement Measurements of Total Wound Length: (cm) 5.4 Width: (cm) 4.3 Depth: (cm) 0.2 Volume: (cm) 3.647 Character of Wound/Ulcer Post Improved Debridement: Severity of Tissue Post Debridement: Fat layer exposed Post Procedure Diagnosis Same as Pre-procedure Electronic Signature(s) Signed: 09/30/2019 6:10:09 PM By: Lenda KelpStone III, Maleny Candy PA-C Signed: 09/30/2019 6:17:53 PM By: Zenaida DeedBoehlein, Linda RN, BSN Entered By: Zenaida DeedBoehlein, Linda on 09/30/2019 16:03:41 -------------------------------------------------------------------------------- Debridement Details Patient Name: Date of Service: [name], Cheyenne B. 09/30/2019 2:45 PM Medical Record [id number] Patient Account Number: [id number] Date of Birth/Sex: 06-04-1961 (58 y.o. Female) Treating RN: Zenaida DeedBoehlein, Linda Primary Care Provider: Docia ChuckKoirala, Dibas Other Clinician: Referring Provider: Treating Provider/Extender:Stone III, Lizabeth LeydenHoyt Koirala, Dibas Weeks in Treatment: 0 Debridement Performed for Wound #3 Left,Lateral Lower Leg Assessment: Performed By: Clinician Yevonne PaxEpps, Carrie, RN Debridement Type: Chemical/Enzymatic/Mechanical Agent Used: Santyl Severity of Tissue Pre Fat layer exposed Debridement: Level of Consciousness (Pre- Awake and Alert procedure): Pre-procedure Verification/Time Out Taken: Yes - 15:55 Start Time: 15:56 Pain Control: Lidocaine 5% topical ointment Bleeding: None End Time: 16:00 Procedural Pain: 0 Post Procedural Pain: 0 Response to Treatment: Procedure was tolerated well Level of Consciousness Awake and Alert (Post-procedure): Post Debridement Measurements of Total Wound Length: (cm) 0.8 Width: (cm) 0.5 Depth: (cm) 0.1 Volume: (cm) 0.031 Character of Wound/Ulcer Post Improved Debridement: Severity of Tissue Post Debridement: Fat layer exposed Post Procedure Diagnosis Same as Pre-procedure Electronic Signature(s) Signed: 09/30/2019 6:10:09 PM By: Lenda KelpStone III, Zayven Powe  PA-C Signed: 09/30/2019 6:17:53 PM By: Zenaida DeedBoehlein, Linda RN, BSN Entered By: Zenaida DeedBoehlein, Linda on 09/30/2019 16:04:56 -------------------------------------------------------------------------------- HPI Details Patient Name: Date of Service: [name], Cheyenne B. 09/30/2019 2:45 PM Medical Record [id number] Patient Account Number: [id number] Date of Birth/Sex: Treating RN: 06-04-1961 (59 y.o. Female) Primary Care Provider: Docia ChuckKoirala, Dibas Other Clinician: Referring Provider: Treating Provider/Extender:Stone III, Lizabeth LeydenHoyt Koirala, Dibas Weeks in Treatment: 0 History of Present Illness HPI Description: 09/30/2019 upon evaluation today patient presents for initial inspection here in our clinic concerning issues that she has been experiencing with a wound on her left anterior lower leg. She unfortunately had a significant  laceration which required 19 sutures and she shows me the pictures both before as well as after sutured in a very good job was done in this regard. Unfortunately the skin did not take and necrosis. Subsequently this wound open and she has been having more significant issues with that since. Fortunately there is no evidence of active infection at this time which is good news. No fever chills noted. She has a history of chronic venous stasis but is no longer able to wear any compression in fact she cannot even wear regular socks due to her neuropathy and the pain that she experiences in her feet if she does. For that reason she tries to elevate her legs as much as she can at nighttime but again she really cannot wear any type of compression. With regard to antibiotic it does not appear that she has been on anything recently. 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 Fortunately there  is no evidence of local or systemic infection. No fevers, chills, nausea, vomiting, or diarrhea. 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 Electronic Signature(s) Signed: 09/30/2019 4:04:29 PM By: Lenda KelpStone III, Billey Wojciak PA-C Entered By: Lenda KelpStone III, Mercy Leppla on 09/30/2019 16:04:29 -------------------------------------------------------------------------------- Physical Exam Details Patient Name: Date of Service: [name], Aarini B. 09/30/2019 2:45 PM Medical Record [id number] Patient Account Number: [id number] Date of Birth/Sex: Treating RN: 1961-09-03 (59 y.o. Female) Primary Care Provider: Docia ChuckKoirala, Dibas Other Clinician: Referring Provider: Treating Provider/Extender:Stone III, Lizabeth LeydenHoyt Koirala, Dibas Weeks in Treatment: 0 Constitutional sitting or standing blood pressure is within target range for patient.. pulse regular and within target range for patient.Marland Kitchen. respirations regular, non-labored and within target range for patient.Marland Kitchen. temperature within target range for patient.. Well-nourished and well-hydrated in no acute distress. Eyes conjunctiva clear no eyelid edema noted. pupils equal round and reactive to light and accommodation. Ears, Nose, Mouth, and Throat no gross abnormality of ear auricles or external auditory canals. normal hearing noted during conversation. mucus membranes moist. Respiratory normal breathing without difficulty. Cardiovascular 1+ dorsalis pedis/posterior tibialis pulses. 1+ pitting edema of the bilateral lower extremities. Gastrointestinal (GI) soft, non-tender, non-distended, +BS. no ventral hernia noted. Musculoskeletal normal gait and posture. no significant deformity or arthritic changes, no loss or range of motion, no clubbing. Psychiatric this patient is able to make decisions and demonstrates good insight into disease process. Alert and Oriented x 3. pleasant and cooperative. Notes Patient's wound currently showed signs  of necrotic tissue on the surface of the wound. She did allow me to perform sharp debridement today and we were actually able to clear away a lot of the necrotic tissue which was good news. With that being said not everything was cleared away and we discussed options going forward for treatment to try to help clear this away. I think Santyl would be ideal for her and we discussed the process of how that works as well. She is in agreement with giving this a try. Electronic Signature(s) Signed: 09/30/2019 4:05:08 PM By: Lenda KelpStone III, Kodie Kishi PA-C Entered By: Lenda KelpStone III, Mozell Hardacre on 09/30/2019 16:05:07 -------------------------------------------------------------------------------- Physician Orders Details Patient Name: Date of Service: [name], Kerly B. 09/30/2019 2:45 PM Medical Record [id number] Patient Account Number: [id number] Date of Birth/Sex: Treating RN:  [birth year] (59 y.o. 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 Dressing Change Frequency Wound #2 Left,Anterior Lower Leg Change dressing every day. Wound #3 Left,Lateral Lower Leg Change dressing every day. Wound Cleansing Wound #2 Left,Anterior Lower Leg May shower and wash wound with soap and water. - scrub gently with gauze Wound #3 Left,Lateral Lower Leg May shower and wash wound with  soap and water. - scrub gently with gauze Primary Wound Dressing Wound #2 Left,Anterior Lower Leg Santyl Ointment Wound #3 Left,Lateral Lower Leg Santyl Ointment Secondary Dressing Wound #2 Left,Anterior Lower Leg Kerlix/Rolled Gauze Telfa (non adherent pad) Wound #3 Left,Lateral Lower Leg Kerlix/Rolled Gauze Telfa (non adherent pad) Edema Control Avoid standing for long periods of time Elevate legs to the level of the heart or above for 30 minutes daily and/or when sitting, a frequency of: - throughout the day Exercise regularly Patient Medications Allergies: doxycycline, Lyrica, Vivelle, Aleve, Motrin, adhesive bandage Notifications Medication Indication Start End lidocaine prior to 09/30/2019 debridement DOSE topical 5 % ointment - ointment topical Santyl 09/30/2019 DOSE topical 250 unit/gram ointment - ointment topical Apply nickel thick to the wound bed and then cover with a dressing as directed in clinic. Electronic Signature(s) Signed: 09/30/2019 4:07:55 PM By: Lenda Kelp PA-C Entered By: Lenda Kelp on 09/30/2019 16:07:54 -------------------------------------------------------------------------------- Problem List Details Patient Name: Date of Service: TAZARIA, DLUGOSZ 09/30/2019 2:45 PM Medical Record [id number] Patient Account Number: [id number] Date of Birth/Sex: Treating RN: [birth year] (59 y.o. Female) Primary Care Provider: [place], Dibas Other Clinician: Referring Provider: Treating Provider/Extender:Stone III, Lizabeth Leyden, Dibas Weeks in Treatment: 0 Active Problems ICD-10 Evaluated Encounter Code Description Active Date Today Diagnosis I87.2 Venous insufficiency (chronic) (peripheral) 09/30/2019 No Yes L97.822 Non-pressure chronic ulcer of other part of left lower 09/30/2019 No Yes leg with fat layer exposed L97.828 Non-pressure chronic ulcer of other part of left lower 09/30/2019 No Yes leg with other specified severity M35.01 Sicca  syndrome with keratoconjunctivitis 09/30/2019 No Yes I73.00 Raynaud's syndrome without gangrene 09/30/2019 No Yes 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 Female) Primary Care Provider: Docia Chuck, Dibas Other Clinician: Referring Provider: Treating Provider/Extender:Stone III, Lizabeth Leyden, Dibas Weeks in Treatment: 0 Subjective Chief Complaint Information obtained from Patient Left LE Ulcers History of Present Illness (HPI) 09/30/2019 upon evaluation today patient presents for initial inspection here in our clinic concerning issues that she has been experiencing with a wound on her left anterior lower leg. She unfortunately had a significant laceration which required 19 sutures and she shows me the pictures both before as well as after sutured in a very good job was done in this regard. Unfortunately the skin did not take and necrosis. Subsequently this wound open and  she has been having more significant issues with that since. Fortunately there is no evidence of active infection at this time which is good news. No fever chills noted. She has a history of chronic venous stasis but is no longer able to wear any compression in fact she cannot even wear regular socks due to her neuropathy and the pain that she experiences in her feet if she does. For that reason she tries to elevate her legs as much as she can at nighttime but again she really cannot wear any type of compression. With regard to antibiotic it does  not appear that she has been on anything recently. 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 Fortunately there is no evidence of local or systemic infection. No fevers, chills, nausea, vomiting, or diarrhea. 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 Patient History Information obtained from Patient. Allergies doxycycline (Reaction: rash), Lyrica (Reaction: swelling), Vivelle (Reaction: allergy to adhesive patch), Aleve (Reaction: swelling), Motrin (Reaction: swelling), adhesive bandage (Reaction: rash) Family History Cancer - Mother, Diabetes - Paternal Grandparents, Heart Disease - Maternal Grandparents,Paternal Grandparents, Stroke - Paternal Grandparents,Father, No family history of Hereditary Spherocytosis, Hypertension, Kidney Disease, Lung Disease, Seizures, Thyroid Problems, Tuberculosis. Social History Never smoker, Marital Status - Married, Alcohol Use - Never, Drug Use - No History, Caffeine Use - Daily - coffee, energy juice. Medical History Eyes Denies history of Cataracts, Glaucoma, Optic Neuritis Ear/Nose/Mouth/Throat Denies history of Chronic sinus problems/congestion, Middle ear problems Hematologic/Lymphatic Patient has history of Lymphedema Denies history of Anemia, Hemophilia, Human Immunodeficiency Virus, Sickle Cell Disease Respiratory Denies history of Aspiration, Asthma, Chronic Obstructive Pulmonary Disease (COPD), Pneumothorax, Sleep Apnea, Tuberculosis Cardiovascular Denies history of Angina, Arrhythmia, Congestive Heart Failure, Coronary Artery Disease, Deep Vein Thrombosis, Hypertension, Hypotension, Myocardial Infarction, Peripheral Arterial Disease, Peripheral Venous Disease, Phlebitis,  Vasculitis Gastrointestinal Denies history of Cirrhosis , Colitis, Crohnoos, Hepatitis A, Hepatitis B, Hepatitis C Endocrine Denies history of Type I Diabetes, Type II Diabetes Genitourinary Denies history of End Stage Renal Disease Immunological Patient has history of Raynaudoos Denies history of Lupus Erythematosus, Scleroderma Integumentary (Skin) Denies history of History of Burn Musculoskeletal Denies history of Gout, Rheumatoid Arthritis, Osteoarthritis, Osteomyelitis Neurologic Patient has history of Neuropathy Denies history of Dementia, Quadriplegia, Paraplegia, Seizure Disorder Oncologic Denies history of Received Chemotherapy, Received Radiation Psychiatric Denies history of Anorexia/bulimia, Confinement Anxiety Hospitalization/Surgery History - [place]- left knee scope 07/2002. - [place]- nerve muscle biopsy- 11/2008. Medical And Surgical History Notes Constitutional Symptoms (General Health) Thyroid disease Sjogren's syndrome Review of Systems (ROS) Constitutional Symptoms (General Health) Denies complaints or symptoms of Fatigue, Fever, Chills, Marked Weight Change. Eyes Complains or has symptoms of Glasses / Contacts. Denies complaints or symptoms of Dry Eyes, Vision Changes. Ear/Nose/Mouth/Throat Denies complaints or symptoms of Chronic sinus problems or rhinitis. Respiratory Denies complaints or symptoms of Chronic or frequent coughs, Shortness of Breath. Cardiovascular Denies complaints or symptoms of Chest pain. Gastrointestinal Denies complaints or symptoms of Frequent diarrhea, Nausea, Vomiting. Endocrine Denies complaints or symptoms of Heat/cold intolerance. Genitourinary Denies complaints or symptoms of Frequent urination. Integumentary (Skin) Complains or has symptoms of Wounds - currently left leg.. Musculoskeletal Denies complaints or symptoms of Muscle Pain, Muscle  Weakness. Neurologic Denies complaints or symptoms of  Numbness/parasthesias. Psychiatric Denies complaints or symptoms of Claustrophobia, Suicidal. Objective Constitutional sitting or standing blood pressure is within target range for patient.. pulse regular and within target range for patient.Marland Kitchen respirations regular, non-labored and within target range for patient.Marland Kitchen temperature within target range for patient.. Well-nourished and well-hydrated in no acute distress. Vitals Time Taken: 2:50 PM, Height: 66 in, Source: Stated, Weight: 156 lbs, Source: Stated, BMI: 25.2, Temperature: 97.7 F, Pulse: 79 bpm, Respiratory Rate: 18 breaths/min, Blood Pressure: 126/94 mmHg. Eyes conjunctiva clear no eyelid edema noted. pupils equal round and reactive to light and accommodation. Ears, Nose, Mouth, and Throat no gross abnormality of ear auricles or external auditory canals. normal hearing noted during conversation. mucus membranes moist. Respiratory normal breathing without difficulty. Cardiovascular 1+ dorsalis pedis/posterior tibialis pulses. 1+ pitting edema of the bilateral lower extremities. Gastrointestinal (GI) soft, non-tender, non-distended, +BS. no ventral hernia noted. Musculoskeletal normal gait and posture. no significant deformity or arthritic changes, no loss or range of motion, no clubbing. Psychiatric this patient is able to make decisions and demonstrates good insight into disease process. Alert and Oriented x 3. pleasant and cooperative. General Notes: Patient's wound currently showed signs of necrotic tissue on the surface of the wound. She did allow me to perform sharp debridement today and we were actually able to clear away a lot of the necrotic tissue which was good news. With that being said not everything was cleared away and we discussed options going forward for treatment to try to help clear this away. I think Santyl would be ideal for her and we discussed the process of how that works as well. She is in agreement with  giving this a try. Integumentary (Hair, Skin) Wound #2 status is Open. Original cause of wound was Trauma. The wound is located on the Left,Anterior Lower Leg. The wound measures 5.4cm length x 4.3cm width x 0.2cm depth; 18.237cm^2 area and 3.647cm^3 volume. There is Fat Layer (Subcutaneous Tissue) Exposed exposed. There is no tunneling or undermining noted. There is a medium amount of serosanguineous drainage noted. The wound margin is distinct with the outline attached to the wound base. There is no granulation within the wound bed. There is a large (67-100%) amount of necrotic tissue within the wound bed including Adherent Slough. Wound #3 status is Open. Original cause of wound was Trauma. The wound is located on the Left,Lateral Lower Leg. The wound measures 0.8cm length x 0.5cm width x 0.1cm depth; 0.314cm^2 area and 0.031cm^3 volume. There is no tunneling or undermining noted. There is a medium amount of serosanguineous drainage noted. The wound margin is distinct with the outline attached to the wound base. There is no granulation within the wound bed. There is a large (67-100%) amount of necrotic tissue within the wound bed including Adherent Slough. 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 There was a Excisional Skin/Subcutaneous Tissue Debridement with a total area of 23.22 sq cm performed by Lenda Kelp, PA. With the following instrument(s): Curette to remove Viable and  Non-Viable tissue/material. Material removed includes Subcutaneous  Tissue and Slough and after achieving pain control using Lidocaine 5% topical ointment. No specimens were taken. A time out was conducted at 15:55, prior to the start of the procedure. A Minimum amount of bleeding was controlled with Pressure. The procedure was tolerated well with a pain level of 6 throughout and a pain level of 0 following the procedure. Post Debridement Measurements: 5.4cm length x 4.3cm width x 0.2cm depth; 3.647cm^3 volume. Character of Wound/Ulcer Post Debridement is improved. Severity of Tissue Post Debridement is: Fat layer exposed. Post procedure Diagnosis Wound #2: Same as Pre-Procedure Wound #3 Pre-procedure diagnosis of Wound #3 is a Venous Leg Ulcer located on the Left,Lateral Lower Leg .Severity of Tissue Pre Debridement is: Fat layer exposed. There was a Chemical/Enzymatic/Mechanical debridement performed by Yevonne Pax, RN. to remove Viable and Non-Viable tissue/material. Material removed includes Subcutaneous Tissue and Slough and after achieving pain control using Lidocaine 5% topical ointment. Agent used was [employer]. A time out was conducted at 15:55, prior to the start of the procedure. There was no bleeding. The procedure was tolerated well with a pain level of 0 throughout and a pain level of 0 following the procedure. Post Debridement Measurements: 0.8cm length x 0.5cm width x 0.1cm depth; 0.031cm^3 volume. Character of Wound/Ulcer Post Debridement is improved. Severity of Tissue Post Debridement is: Fat layer exposed. Post procedure Diagnosis Wound #3: Same as Pre-Procedure Plan Follow-up Appointments: Return Appointment in 1 week. Dressing Change Frequency: Wound #2 Left,Anterior Lower Leg: Change dressing every day. Wound #3 Left,Lateral Lower Leg: Change dressing every day. Wound Cleansing: Wound #2 Left,Anterior Lower Leg: May shower and wash wound with soap and water. - scrub  gently with gauze Wound #3 Left,Lateral Lower Leg: May shower and wash wound with soap and water. - scrub gently with gauze Primary Wound Dressing: Wound #2 Left,Anterior Lower Leg: Santyl Ointment Wound #3 Left,Lateral Lower Leg: Santyl Ointment Secondary Dressing: Wound #2 Left,Anterior Lower Leg: Kerlix/Rolled Gauze Telfa (non adherent pad) Wound #3 Left,Lateral Lower Leg: Kerlix/Rolled Gauze Telfa (non adherent pad) Edema Control: Avoid standing for long periods of time Elevate legs to the level of the heart or above for 30 minutes daily and/or when sitting, a frequency of: - throughout the day Exercise regularly The following medication(s) was prescribed: lidocaine topical 5 % ointment ointment topical for prior to debridement was prescribed at facility Santyl topical 250 unit/gram ointment ointment topical Apply nickel thick to the wound bed and then cover with a dressing as directed in clinic. starting 09/30/2019 1. My suggestion at this time is good to be that we go ahead and initiate treatment with Santyl. I think this is probably can be the best option for her at this time and the patient is in agreement with giving this a shot. 2. I am also going to suggest that since we cannot perform any compression at this point due to her neuropathy and the severe pain she has that she try to elevate as much as possible and will just have to do what we can to try to get this to heal without the benefit of compression which I think would speed things along. 3. I do not see any need for him antibiotic at this point although if anything worsens or changes we can always initiate antibiotics as needed in the future. We will see patient back for reevaluation in 1 week here in the clinic. If anything worsens or changes patient will contact our office for additional recommendations. Electronic Signature(s) Signed: 09/30/2019 4:08:06  PM By: Lenda Kelp PA-C Entered By: Lenda Kelp on  09/30/2019 16:08:05 -------------------------------------------------------------------------------- HxROS Details Patient Name: Date of Service: FRANCEE, SETZER 09/30/2019 2:45 PM Medical Record [id number] Patient Account Number: [id number] Date of Birth/Sex: Treating RN: 1961/07/21 (59 y.o. Female) Shawn Stall Primary Care Provider: Docia Chuck, Dibas Other Clinician: Referring Provider: Treating Provider/Extender:Stone III, Lizabeth Leyden, Dibas Weeks in Treatment: 0 Information Obtained From Patient Constitutional Symptoms (General Health) Complaints and Symptoms: Negative for: Fatigue; Fever; Chills; Marked Weight Change Medical History: Past Medical History Notes: Thyroid disease Sjogren's syndrome Eyes Complaints and Symptoms: Positive for: Glasses / Contacts Negative for: Dry Eyes; Vision Changes Medical History: Negative for: Cataracts; Glaucoma; Optic Neuritis Ear/Nose/Mouth/Throat Complaints and Symptoms: Negative for: Chronic sinus problems or rhinitis Medical History: Negative for: Chronic sinus problems/congestion; Middle ear problems Respiratory Complaints and Symptoms: Negative for: Chronic or frequent coughs; Shortness of Breath Medical History: Negative for: Aspiration; Asthma; Chronic Obstructive Pulmonary Disease (COPD); Pneumothorax; Sleep Apnea; Tuberculosis Cardiovascular Complaints and Symptoms: Negative for: Chest pain Medical History: Negative for: Angina; Arrhythmia; Congestive Heart Failure; Coronary Artery Disease; Deep Vein Thrombosis; Hypertension; Hypotension; Myocardial Infarction; Peripheral Arterial Disease; Peripheral Venous Disease; Phlebitis; Vasculitis Gastrointestinal Complaints and Symptoms: Negative for: Frequent diarrhea; Nausea; Vomiting Medical History: Negative for: Cirrhosis ; Colitis; Crohns; Hepatitis A; Hepatitis B; Hepatitis C Endocrine Complaints and Symptoms: Negative for: Heat/cold intolerance Medical  History: Negative for: Type I Diabetes; Type II Diabetes Genitourinary Complaints and Symptoms: Negative for: Frequent urination Medical History: Negative for: End Stage Renal Disease Integumentary (Skin) Complaints and Symptoms: Positive for: Wounds - currently left leg. Medical History: Negative for: History of Burn Musculoskeletal Complaints and Symptoms: Negative for: Muscle Pain; Muscle Weakness Medical History: Negative for: Gout; Rheumatoid Arthritis; Osteoarthritis; Osteomyelitis Neurologic Complaints and Symptoms: Negative for: Numbness/parasthesias Medical History: Positive for: Neuropathy Negative for: Dementia; Quadriplegia; Paraplegia; Seizure Disorder Psychiatric Complaints and Symptoms: Negative for: Claustrophobia; Suicidal Medical History: Negative for: Anorexia/bulimia; Confinement Anxiety Hematologic/Lymphatic Medical History: Positive for: Lymphedema Negative for: Anemia; Hemophilia; Human Immunodeficiency Virus; Sickle Cell Disease Immunological Medical History: Positive for: Raynauds Negative for: Lupus Erythematosus; Scleroderma Oncologic Medical History: Negative for: Received Chemotherapy; Received Radiation Immunizations Pneumococcal Vaccine: Received Pneumococcal Vaccination: No Implantable Devices No devices added Hospitalization / Surgery History Type of Hospitalization/Surgery [place]- left knee scope 07/2002 [place]- nerve muscle biopsy- 11/2008 Family and Social History Cancer: Yes - Mother; Diabetes: Yes - Paternal Grandparents; Heart Disease: Yes - Maternal Grandparents,Paternal Grandparents; Hereditary Spherocytosis: No; Hypertension: No; Kidney Disease: No; Lung Disease: No; Seizures: No; Stroke: Yes - Paternal Grandparents,Father; Thyroid Problems: No; Tuberculosis: No; Never smoker; Marital Status - Married; Alcohol Use: Never; Drug Use: No History; Caffeine Use: Daily - coffee, energy juice; Financial Concerns: No;  Food, Clothing or Shelter Needs: No; Support System Lacking: No; Transportation Concerns: No Electronic Signature(s) Signed: 09/30/2019 5:26:35 PM By: Shawn Stall Signed: 09/30/2019 6:10:09 PM By: Lenda Kelp PA-C Entered By: Shawn Stall on 09/30/2019 15:01:58 -------------------------------------------------------------------------------- SuperBill Details Patient Name: Date of Service: Cheyenne Lemons 09/30/2019 Medical Record [id number] Patient Account Number: [id number] Date of Birth/Sex: Treating RN: 05/24/1961 (59 y.o. 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, unspecified Facility Procedures CPT4 Code Description: [id number] 99213 - WOUND CARE VISIT-LEV 3 EST PT Modifier: 25 Quantity: 1 CPT4 Code Description: [id number] 97602 - DEBRIDE W/O ANES NON SELECT Modifier: 73 Quantity: 1 CPT4 Code Description: [id number] 11042 - DEB SUBQ TISSUE 20 SQ CM/< ICD-10 Diagnosis Description L97.822 Non-pressure chronic ulcer of other part of left lower leg wi Modifier: th fat layer expo Quantity: 1 sed CPT4 Code Description: [id number] 11045 - DEB SUBQ TISS EA ADDL 20CM ICD-10 Diagnosis Description L97.822 Non-pressure chronic ulcer of other part of left lower leg wi Modifier: th fat layer expo Quantity: 1 sed Physician Procedures CPT4 Code Description: [id number] 81275 - WC PHYS LEVEL 4 - NEW PT ICD-10 Diagnosis Description I87.2 Venous  insufficiency (chronic) (peripheral) L97.822 Non-pressure chronic ulcer of other part of left lower leg L97.828 Non-pressure chronic ulcer of other  part of left lower leg severity 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 Modifier: 25 with fat layer e with other speci Quantity: 1 xposed fied CPT4 Code Description: [id number] 11042 - WC PHYS SUBQ TISS 20 SQ CM ICD-10 Diagnosis Description L97.822 Non-pressure chronic ulcer of other part of left lower leg with Modifier: fat layer expos Quantity: 1 ed CPT4 Code Description: [id number] 11045 - WC PHYS SUBQ TISS EA ADDL 20 CM ICD-10 Diagnosis Description L97.822 Non-pressure chronic ulcer of other part of left lower leg with Modifier: fat layer expos Quantity: 1 ed Electronic Signature(s) Signed: 09/30/2019 4:08:38 PM By: Worthy Keeler PA-C Entered By: Worthy Keeler on 09/30/2019 16:08:38

## 2019-09-30 NOTE — Progress Notes (Addendum)
NEHAL, WITTING ([id number]) Visit Report for 09/30/2019 Allergy List Details Patient Name: Date of Service: PEGEEN, STIGER 09/30/2019 2:45 PM Medical Record [id number] Patient Account Number: [id number] Date of Birth/Sex: Treating RN: 1961/07/12 (59 y.o. Female) Shawn Stall Primary Care Arlyss Weathersby: Docia Chuck, Dibas Other Clinician: Referring Ashland Osmer: Treating Velmer Woelfel/Extender:Stone III, Lizabeth Leyden, Dibas Weeks in Treatment: 0 Allergies Active Allergies doxycycline Reaction: rash Lyrica Reaction: swelling Vivelle Reaction: allergy to adhesive patch Aleve Reaction: swelling Motrin Reaction: swelling adhesive bandage Reaction: rash Allergy Notes Electronic Signature(s) Signed: 09/30/2019 5:26:35 PM By: Shawn Stall Entered By: Shawn Stall on 09/30/2019 15:20:20 -------------------------------------------------------------------------------- Arrival Information Details Patient Name: Date of Service: [name] 09/30/2019 2:45 PM Medical Record [id number] Patient Account Number: [id number] Date of Birth/Sex: Treating RN: 15-Dec-1960 (59 y.o. Female) Shawn Stall Primary Care Jake Goodson: Docia Chuck, Dibas Other Clinician: Referring Lee-Anne Flicker: Treating Dominyck Reser/Extender:Stone III, Lizabeth Leyden, Dibas Weeks in Treatment: 0 Visit Information Patient Arrived: Ambulatory Arrival Time: 14:45 Accompanied By: self Transfer Assistance: None Patient Identification Verified: Yes Secondary Verification Process Yes Completed: Patient Requires Transmission-Based No Precautions: Patient Has Alerts: Yes History Since Last Visit Added or deleted any medications: No Any new allergies or adverse reactions: No Had a fall or experienced change in activities of daily living that may affect risk of falls: No Signs or symptoms of abuse/neglect since last visito No Hospitalized since last visit: No Implantable device outside of the clinic excluding No cellular tissue  based products placed in the center since last visit: Electronic Signature(s) Signed: 09/30/2019 5:26:35 PM By: Shawn Stall Entered By: Shawn Stall on 09/30/2019 14:47:01 -------------------------------------------------------------------------------- Clinic Level of Care Assessment Details Patient Name: Date of Service: HARMONI, LUCUS 09/30/2019 2:45 PM Medical Record [id number] Patient Account Number: [id number] Date of Birth/Sex: Treating RN: [birth year] (59 y.o. Female) Zenaida Deed Primary Care Caili Escalera: Docia Chuck, Dibas Other Clinician: Referring Sera Hitsman: Treating Mathew Postiglione/Extender:Stone III, Lizabeth Leyden, Dibas Weeks in Treatment: 0 Clinic Level of Care Assessment Items TOOL 1 Quantity Score []  - Use when EandM and Procedure is performed on INITIAL visit 0 ASSESSMENTS - Nursing Assessment / Reassessment X - General Physical Exam (combine w/ comprehensive assessment (listed just below) 1 20 when performed on new pt. evals) X - Comprehensive Assessment (HX, ROS, Risk Assessments, Wounds Hx, etc.) 1 25 ASSESSMENTS - Wound and Skin Assessment / Reassessment []  - Dermatologic / Skin Assessment (not related to wound area) 0 ASSESSMENTS - Ostomy and/or Continence Assessment and Care []  - Incontinence Assessment and Management 0 []  - Ostomy Care Assessment and Management (repouching, etc.) 0 PROCESS - Coordination of Care X - Simple Patient / Family Education for ongoing care 1 15 []  - Complex (extensive) Patient / Family Education for ongoing care 0 X - Staff obtains , Records, Test Results / Process Orders 1 10 []  - Staff telephones HHA, Nursing Homes / Clarify orders / etc 0 []  - Routine Transfer to another Facility (non-emergent condition) 0 []  - Routine Hospital Admission (non-emergent condition) 0 X - New Admissions / / Ordering NPWT, Apligraf, etc. 1 15 []  - Emergency Hospital Admission (emergent condition) 0 PROCESS - Special  Needs []  - Pediatric / Minor Patient Management 0 []  - Isolation Patient Management 0 []  - Hearing / Language / Visual special needs 0 []  - Assessment of Community assistance (transportation, D/C planning, etc.) 0 []  - Additional assistance / Altered mentation 0 []  - Support Surface(s) Assessment (bed, cushion, seat, etc.) 0 INTERVENTIONS - Miscellaneous []  - External ear exam 0 []  - Patient Transfer (multiple  staff / Harrel Lemon Lift / Similar devices) 0 []  - Simple Staple / Suture removal (25 or less) 0 []  - Complex Staple / Suture removal (26 or more) 0 []  - Hypo/Hyperglycemic Management (do not check if billed separately) 0 X - Ankle / Brachial Index (ABI) - do not check if billed separately 1 15 Has the patient been seen at the hospital within the last three years: Yes Total Score: 100 Level Of Care: New/Established - Level 3 Electronic Signature(s) Signed: 09/30/2019 6:17:53 PM By: Baruch Gouty RN, BSN Entered By: Baruch Gouty on 09/30/2019 15:53:48 -------------------------------------------------------------------------------- Encounter Discharge Information Details Patient Name: Date of Service: [name] 09/30/2019 2:45 PM Medical Record [id number] Patient Account Number: [id number] Date of Birth/Sex: 16-Jul-1961 (58 y.o. Female) Treating RN: Carlene Coria Primary Care Hiromi Knodel: [name], Dibas Other Clinician: Referring Kert Shackett: Treating Shequita Peplinski/Extender:Stone III, Sharen Counter, Dibas Weeks in Treatment: 0 Encounter Discharge Information Items Post Procedure Vitals Discharge Condition: Stable Temperature (F): 97.7 Ambulatory Status: Ambulatory Pulse (bpm): 79 Discharge Destination: Home Respiratory Rate (breaths/min): 18 Transportation: Private Auto Blood Pressure (mmHg): 126/94 Accompanied By: self Schedule Follow-up Appointment: Yes Clinical Summary of Care: Patient Declined Electronic Signature(s) Signed: 09/30/2019 5:50:55 PM By: Carlene Coria  RN Entered By: Carlene Coria on 09/30/2019 16:20:25 -------------------------------------------------------------------------------- Lower Extremity Assessment Details Patient Name: Date of Service: TIARI, ANDRINGA 09/30/2019 2:45 PM Medical Record [id number] Patient Account Number: [id number] Date of Birth/Sex: Treating RN: 05-06-1961 (59 y.o. Female) Deon Pilling Primary Care Vickye Astorino: [name], Dibas Other Clinician: Referring Chiamaka Latka: Treating Analycia Khokhar/Extender:Stone III, Sharen Counter, Dibas Weeks in Treatment: 0 Edema Assessment Assessed: [Left: Yes] [Right: No] Edema: [Left: Ye] [Right: s] Calf Left: Right: Point of Measurement: 29 cm From Medial Instep 36 cm cm Ankle Left: Right: Point of Measurement: 8 cm From Medial Instep 22 cm cm Vascular Assessment Pulses: Dorsalis Pedis Palpable: [Left:Yes] Doppler Audible: [Left:Yes] Posterior Tibial Palpable: [Left:Yes] Doppler Audible: [Left:Yes] Blood Pressure: Brachial: [Left:128] Ankle: [Left:Dorsalis Pedis: 150 1.17] Electronic Signature(s) Signed: 09/30/2019 5:26:35 PM By: Deon Pilling Entered By: Deon Pilling on 09/30/2019 15:08:58 -------------------------------------------------------------------------------- Multi-Disciplinary Care Plan Details Patient Name: Date of Service: [name] 09/30/2019 2:45 PM Medical Record [id number] Patient Account Number: [id number] Date of Birth/Sex: Treating RN: [birth year] (59 y.o. Female) Baruch Gouty Primary Care Arvis Zwahlen: [name], Dibas Other Clinician: Referring Virgal Warmuth: Treating Drevon Plog/Extender:Stone III, Sharen Counter, Dibas Weeks in Treatment: 0 Active Inactive Abuse / Safety / Falls / Self Care Management Nursing Diagnoses: Potential for falls Goals: Patient/caregiver will verbalize/demonstrate measures taken to prevent injury and/or falls Date Initiated: 09/30/2019 Target Resolution Date: 10/28/2019 Goal Status:  Active Interventions: Assess fall risk on admission and as needed Notes: Venous Leg Ulcer Nursing Diagnoses: Knowledge deficit related to disease process and management Potential for venous Insuffiency (use before diagnosis confirmed) Goals: Patient will maintain optimal edema control Date Initiated: 09/30/2019 Target Resolution Date: 10/28/2019 Goal Status: Active Patient/caregiver will verbalize understanding of disease process and disease management Date Initiated: 09/30/2019 Target Resolution Date: 10/28/2019 Goal Status: Active Interventions: Assess peripheral edema status every visit. Compression as ordered Provide education on venous insufficiency Treatment Activities: Therapeutic compression applied : 09/30/2019 Notes: Wound/Skin Impairment Nursing Diagnoses: Impaired tissue integrity Knowledge deficit related to ulceration/compromised skin integrity Goals: Patient/caregiver will verbalize understanding of skin care regimen Date Initiated: 09/30/2019 Target Resolution Date: 10/28/2019 Goal Status: Active Ulcer/skin breakdown will have a volume reduction of 30% by week 4 Date Initiated: 09/30/2019 Target Resolution Date: 10/28/2019 Goal Status: Active Interventions: Assess patient/caregiver ability to obtain necessary supplies Assess patient/caregiver ability to  perform ulcer/skin care regimen upon admission and as needed Assess ulceration(s) every visit Provide education on ulcer and skin care Treatment Activities: Skin care regimen initiated : 09/30/2019 Topical wound management initiated : 09/30/2019 Notes: Electronic Signature(s) Signed: 09/30/2019 6:17:53 PM By: Zenaida Deed RN, BSN Entered By: Zenaida Deed on 09/30/2019 15:52:39 -------------------------------------------------------------------------------- Pain Assessment Details Patient Name: Date of Service: [name] 09/30/2019 2:45 PM Medical Record [id number] Patient Account Number:  [id number] Date of Birth/Sex: Treating RN: [birth year] (59 y.o. Female) Shawn Stall Primary Care Hamda Klutts: Docia Chuck, Dibas Other Clinician: Referring Delorus Langwell: Treating Laylaa Guevarra/Extender:Stone III, Lizabeth Leyden, Dibas Weeks in Treatment: 0 Active Problems Location of Pain Severity and Description of Pain Patient Has Paino Yes Site Locations Pain Location: Generalized Pain, Pain in Ulcers Rate the pain. Current Pain Level: 1 Worst Pain Level: 10 Least Pain Level: 0 Tolerable Pain Level: 8 Pain Management and Medication Current Pain Management: Medication: Yes Cold Application: No Rest: Yes Massage: No Activity: No T.E.N.S.: No Heat Application: No Leg drop or elevation: Yes Is the Current Pain Management Adequate: Adequate How does your wound impact your activities of daily livingo Sleep: No Bathing: No Appetite: No Relationship With Others: No Bladder Continence: No Emotions: No Bowel Continence: No Work: No Toileting: No Drive: No Dressing: No Hobbies: No Electronic Signature(s) Signed: 09/30/2019 5:26:35 PM By: Shawn Stall Entered By: Shawn Stall on 09/30/2019 14:51:47 -------------------------------------------------------------------------------- Patient/Caregiver Education Details Patient Name: [name] 1/27/2021andnbsp2:45 Date of Service: PM Medical Record [id number] Number: Patient Account Number: [id number] Treating RN: Date of Birth/Gender: [birth year] (59 y.o. Zenaida Deed Female) Other Clinician: Primary Care Treating [place], Dibas Lenda Kelp Physician: Physician/Extender: Referring Physician: Docia Chuck, Dibas Weeks in Treatment: 0 Education Assessment Education Provided To: Patient Education Topics Provided Venous: Methods: Explain/Verbal Responses: Reinforcements needed, State content correctly Wound/Skin Impairment: Methods: Explain/Verbal Responses: Reinforcements needed, State content correctly Electronic  Signature(s) Signed: 09/30/2019 6:17:53 PM By: Zenaida Deed RN, BSN Entered By: Zenaida Deed on 09/30/2019 15:52:57 -------------------------------------------------------------------------------- Wound Assessment Details Patient Name: Date of Service: [name] 09/30/2019 2:45 PM Medical Record [id number] Patient Account Number: [id number] Date of Birth/Sex: Treating RN: 09-Jan-1961 (59 y.o. Female) Primary Care Camryn Lampson: Docia Chuck, Dibas Other Clinician: Referring Eleftheria Taborn: Treating Barnaby Rippeon/Extender:Stone III, Lizabeth Leyden, Dibas Weeks in Treatment: 0 Wound Status Wound Number: 2 Primary Venous Leg Ulcer Etiology: Wound Location: Left Lower Leg - Anterior Wound Status: Open Wounding Event: Trauma Comorbid Lymphedema, Raynauds, Date Acquired: 08/06/2019 History: Neuropathy Weeks Of Treatment: 0 Clustered Wound: No Photos Wound Measurements Length: (cm) 5.4 % Reduct Width: (cm) 4.3 % Reduct Depth: (cm) 0.2 Epitheli Area: (cm) 18.237 Tunneli Volume: (cm) 3.647 Undermi Wound Description Classification: Full Thickness Without Exposed Support Foul Odo Structures Slough/F Wound Distinct, outline attached Margin: Exudate Medium Amount: Exudate Serosanguineous Type: Exudate red, brown Color: Wound Bed Granulation Amount: None Present (0%) Necrotic Amount: Large (67-100%) Fascia Exp Necrotic Quality: Adherent Slough Fat Layer Tendon Exp Muscle Exp Joint Expo Bone Expos r After Cleansing: No ibrino Yes Exposed Structure osed: No (Subcutaneous Tissue) Exposed: Yes osed: No osed: No sed: No ed: No ion in Area: 0% ion in Volume: 0% alization: Small (1-33%) ng: No ning: No Treatment Notes Wound #2 (Left, Anterior Lower Leg) 1. Cleanse With Wound Cleanser Soap and water 3. Primary Dressing Applied Santyl 4. Secondary Dressing Roll Gauze Other secondary dressing (specify in notes) 5. Secured With Tape Notes non adjerent  pad Electronic Signature(s) Signed: 10/01/2019 5:34:21 PM By: Benjaman Kindler EMT/HBOT Previous Signature: 09/30/2019 5:26:35 PM Version By: Elesa Hacker  Bobbi Entered By: Benjaman Kindler on 10/01/2019 16:18:25 -------------------------------------------------------------------------------- Wound Assessment Details Patient Name: Date of Service: FABLE, HUISMAN 09/30/2019 2:45 PM Medical Record [id number] Patient Account Number: [id number] Date of Birth/Sex: Treating RN: [birth year] (59 y.o. Female) Primary Care Braedan Meuth: Docia Chuck, Dibas Other Clinician: Referring Alaney Witter: Treating Nakoma Gotwalt/Extender:Stone III, Lizabeth Leyden, Dibas Weeks in Treatment: 0 Wound Status Wound Number: 3 Primary Venous Leg Ulcer Etiology: Wound Location: Left Lower Leg - Lateral Wound Status: Open Wounding Event: Trauma Comorbid Lymphedema, Raynauds, Date Acquired: 08/06/2019 History: Neuropathy Weeks Of Treatment: 0 Clustered Wound: No Photos Wound Measurements Length: (cm) 0.8 % Reductio Width: (cm) 0.5 % Reductio Depth: (cm) 0.1 Epithelial Area: (cm) 0.314 Tunneling Volume: (cm) 0.031 Undermini Wound Description Classification: Unclassifiable Wound Margin: Distinct, outline attached Exudate Amount: Medium Exudate Type: Serosanguineous Exudate Color: red, brown Wound Bed Granulation Amount: None Present (0%) Necrotic Amount: Large (67-100%) Necrotic Quality: Adherent Slough Foul Odor After Cleansing: No Slough/Fibrino Yes Exposed Structure Fascia Exposed: No Fat Layer (Subcutaneous Tissue) Exposed: No Tendon Exposed: No Muscle Exposed: No Joint Exposed: No Bone Exposed: No n in Area: 0% n in Volume: 0% ization: None : No ng: No Treatment Notes Wound #3 (Left, Lateral Lower Leg) 1. Cleanse With Wound Cleanser Soap and water 3. Primary Dressing Applied Santyl 4. Secondary Dressing Roll Gauze Other secondary dressing (specify in notes) 5. Secured With Tape Notes non  adjerent pad Electronic Signature(s) Signed: 10/01/2019 5:34:21 PM By: Benjaman Kindler EMT/HBOT Previous Signature: 09/30/2019 5:26:35 PM Version By: Shawn Stall Entered By: Benjaman Kindler on 10/01/2019 16:19:36 -------------------------------------------------------------------------------- Vitals Details Patient Name: Date of Service: [name] 09/30/2019 2:45 PM Medical Record [id number] Patient Account Number: [id number] Date of Birth/Sex: Treating RN: June 17, 1961 (59 y.o. Female) Shawn Stall Primary Care Cadince Hilscher: Docia Chuck, Dibas Other Clinician: Referring Lashawnta Burgert: Treating Marqual Mi/Extender:Stone III, Lizabeth Leyden, Dibas Weeks in Treatment: 0 Vital Signs Time Taken: 14:50 Temperature (F): 97.7 Height (in): 66 Pulse (bpm): 79 Source: Stated Respiratory Rate (breaths/min): 18 Weight (lbs): 156 Blood Pressure (mmHg): 126/94 Source: Stated Reference Range: 80 - 120 mg / dl Body Mass Index (BMI): 25.2 Electronic Signature(s) Signed: 09/30/2019 5:26:35 PM By: Shawn Stall Entered By: Shawn Stall on 09/30/2019 14:55:59

## 2019-09-30 NOTE — Progress Notes (Signed)
[name], [name] ([id number]) Visit Report for 09/30/2019 Abuse/Suicide Risk Screen Details Patient Name: Date of Service: [name], [name] 09/30/2019 2:45 PM Medical Record [id number] Patient Account Number: [id number] Date of Birth/Sex: Treating RN: 1960-09-04 (59 y.o. Female) Shawn Stall Primary Care Hillery Bhalla: Docia Chuck, Dibas Other Clinician: Referring Beverley Sherrard: Treating Maguadalupe Lata/Extender:Stone III, Lizabeth Leyden, Dibas Weeks in Treatment: 0 Abuse/Suicide Risk Screen Items Answer ABUSE RISK SCREEN: Has anyone close to you tried to hurt or harm you recentlyo No Do you feel uncomfortable with anyone in your familyo No Has anyone forced you do things that you didnt want to doo No Electronic Signature(s) Signed: 09/30/2019 5:26:35 PM By: Shawn Stall Entered By: Shawn Stall on 09/30/2019 14:49:22 -------------------------------------------------------------------------------- Activities of Daily Living Details Patient Name: Date of Service: [name], [name] 09/30/2019 2:45 PM Medical Record [id number] Patient Account Number: [id number] Date of Birth/Sex: Treating RN: 08/30/1961 (59 y.o. Female) Shawn Stall Primary Care Yexalen Deike: Docia Chuck, Dibas Other Clinician: Referring Lillianah Swartzentruber: Treating Harnoor Kohles/Extender:Stone III, Lizabeth Leyden, Dibas Weeks in Treatment: 0 Activities of Daily Living Items Answer Activities of Daily Living (Please select one for each item) Drive Automobile Completely Able Take Medications Completely Able Use Telephone Completely Able Care for Appearance Completely Able Use Toilet Completely Able Bath / Shower Completely Able Dress Self Completely Able Feed Self Completely Able Walk Completely Able Get In / Out Bed Completely Able Housework Completely Able Prepare Meals Completely Able Handle Money Completely Able Shop for Self Completely Able Electronic Signature(s) Signed: 09/30/2019 5:26:35 PM By: Shawn Stall Entered By: Shawn Stall on 09/30/2019 14:49:46 -------------------------------------------------------------------------------- Education Screening Details Patient Name: Date of Service: [name] 09/30/2019 2:45 PM Medical Record [id number] Patient Account Number: [id number] Date of Birth/Sex: Treating RN: 03/27/1961 (59 y.o. Female) Shawn Stall Primary Care Morrigan Wickens: Docia Chuck, Dibas Other Clinician: Referring Reef Achterberg: Treating Tikesha Mort/Extender:Stone III, Lizabeth Leyden, Dibas Weeks in Treatment: 0 Primary Learner Assessed: Patient Learning Preferences/Education Level/Primary Language Learning Preference: Explanation, Demonstration, Printed Material Highest Education Level: College or Above Preferred Language: English Cognitive Barrier Language Barrier: No Translator Needed: No Memory Deficit: No Emotional Barrier: No Cultural/Religious Beliefs Affecting Medical Care: No Physical Barrier Impaired Vision: Yes Glasses Impaired Hearing: No Decreased Hand dexterity: No Knowledge/Comprehension Knowledge Level: High Comprehension Level: High Ability to understand written High instructions: Ability to understand verbal High instructions: Motivation Anxiety Level: Calm Cooperation: Cooperative Education Importance: Acknowledges Need Interest in Health Problems: Asks Questions Perception: Coherent Willingness to Engage in Self- High Management Activities: Readiness to Engage in Self- High Management Activities: Electronic Signature(s) Signed: 09/30/2019 5:26:35 PM By: Shawn Stall Entered By: Shawn Stall on 09/30/2019 14:50:32 -------------------------------------------------------------------------------- Fall Risk Assessment Details Patient Name: Date of Service: [name] 09/30/2019 2:45 PM Medical Record [id number] Patient Account Number: [id number] Date of Birth/Sex: Treating RN: May 29, 1961 (58 y.o. Female) Shawn Stall Primary Care Jasmina Gendron:  Docia Chuck, Dibas Other Clinician: Referring Evanie Buckle: Treating Merin Borjon/Extender:Stone III, Lizabeth Leyden, Dibas Weeks in Treatment: 0 Fall Risk Assessment Items Have you had 2 or more falls in the last 12 monthso 0 No Have you had any fall that resulted in injury in the last 12 monthso 0 No FALLS RISK SCREEN History of falling - immediate or within 3 months 25 Yes Secondary diagnosis (Do you have 2 or more medical diagnoseso) 0 No Ambulatory aid None/bed rest/wheelchair/nurse 0 Yes Crutches/cane/walker 0 No Furniture 0 No Intravenous therapy Access/Saline/Heparin Lock 0 No Weak (short steps with or without shuffle, stooped but able to lift head 0 No while walking, may seek support from furniture) Impaired (short  steps with shuffle, may have difficulty arising from chair, 0 No head down, impaired balance) Mental Status Oriented to own ability 0 Yes Overestimates or forgets limitations 0 No Risk Level: Medium Risk Score: 25 Electronic Signature(s) Signed: 09/30/2019 5:26:35 PM By: Deon Pilling Entered By: Deon Pilling on 09/30/2019 14:50:57 -------------------------------------------------------------------------------- Foot Assessment Details Patient Name: Date of Service: [name] 09/30/2019 2:45 PM Medical Record [id number] Patient Account Number: [id number] Date of Birth/Sex: Treating RN: 1960-09-14 (59 y.o. Female) Deon Pilling Primary Care Crews Mccollam: [name], Dibas Other Clinician: Referring Aireonna Bauer: Treating Alysha Doolan/Extender:Stone III, Sharen Counter, Dibas Weeks in Treatment: 0 Foot Assessment Items Site Locations + = Sensation present, - = Sensation absent, C = Callus, U = Ulcer R = Redness, W = Warmth, M = Maceration, PU = Pre-ulcerative lesion F = Fissure, S = Swelling, D = Dryness Assessment Right: Left: Other Deformity: No No Prior Foot Ulcer: No No Prior Amputation: No No Charcot Joint: No No Ambulatory Status: Ambulatory Without Help Gait:  Steady Electronic Signature(s) Signed: 09/30/2019 5:26:35 PM By: Deon Pilling Entered By: Deon Pilling on 09/30/2019 15:09:37 -------------------------------------------------------------------------------- Nutrition Risk Screening Details Patient Name: Date of Service: [name], [name] 09/30/2019 2:45 PM Medical Record [id number] Patient Account Number: [id number] Date of Birth/Sex: Treating RN: 1961-07-17 (59 y.o. Female) Deon Pilling Primary Care Keanan Melander: [name], Dibas Other Clinician: Referring Jahmari Esbenshade: Treating Marrianne Sica/Extender:Stone III, Sharen Counter, Dibas Weeks in Treatment: 0 Height (in): Weight (lbs): Body Mass Index (BMI): Nutrition Risk Screening Items Score Screening NUTRITION RISK SCREEN: I have an illness or condition that made me change the kind and/or 2 Yes amount of food I eat I eat fewer than two meals per day 0 No I eat few fruits and vegetables, or milk products 0 No I have three or more drinks of beer, liquor or wine almost every day 0 No I have tooth or mouth problems that make it hard for me to eat 0 No I don't always have enough money to buy the food I need 0 No I eat alone most of the time 0 No I take three or more different prescribed or over-the-counter drugs a day 1 Yes 0 No Without wanting to, I have lost or gained 10 pounds in the last six months I am not always physically able to shop, cook and/or feed myself 0 No Nutrition Protocols Good Risk Protocol Provide education on Moderate Risk Protocol 0 nutrition High Risk Proctocol Risk Level: Moderate Risk Score: 3 Electronic Signature(s) Signed: 09/30/2019 5:26:35 PM By: Deon Pilling Entered By: Deon Pilling on 09/30/2019 14:51:19

## 2019-10-07 ENCOUNTER — Encounter (HOSPITAL_BASED_OUTPATIENT_CLINIC_OR_DEPARTMENT_OTHER): Payer: Worker's Compensation | Admitting: Physician Assistant

## 2019-10-07 ENCOUNTER — Other Ambulatory Visit: Payer: Self-pay

## 2019-10-07 DIAGNOSIS — M3501 Sicca syndrome with keratoconjunctivitis: Secondary | ICD-10-CM | POA: Diagnosis not present

## 2019-10-07 DIAGNOSIS — I7300 Raynaud's syndrome without gangrene: Secondary | ICD-10-CM | POA: Insufficient documentation

## 2019-10-07 DIAGNOSIS — G609 Hereditary and idiopathic neuropathy, unspecified: Secondary | ICD-10-CM | POA: Diagnosis not present

## 2019-10-07 DIAGNOSIS — I890 Lymphedema, not elsewhere classified: Secondary | ICD-10-CM | POA: Diagnosis not present

## 2019-10-07 DIAGNOSIS — I872 Venous insufficiency (chronic) (peripheral): Secondary | ICD-10-CM | POA: Insufficient documentation

## 2019-10-07 DIAGNOSIS — L97822 Non-pressure chronic ulcer of other part of left lower leg with fat layer exposed: Secondary | ICD-10-CM | POA: Insufficient documentation

## 2019-10-07 DIAGNOSIS — L97828 Non-pressure chronic ulcer of other part of left lower leg with other specified severity: Secondary | ICD-10-CM | POA: Insufficient documentation

## 2019-10-07 NOTE — Progress Notes (Addendum)
[name], Cheyenne B. ([id number]) Visit Report for 10/07/2019 Chief Complaint Document Details Patient Name: Date of Service: [name], Cheyenne B. 10/07/2019 1:15 PM Medical Record [id number] Patient Account Number: [id number] Date of Birth/Sex: Treating RN: 1960/11/10 (59 y.o. F) Primary Care Provider: Docia ChuckKoirala, Dibas Other Clinician: Referring Provider: Treating Provider/Extender:Stone III, Lizabeth LeydenHoyt Koirala, Dibas Weeks in Treatment: 1 Information Obtained from: Patient Chief Complaint Left LE Ulcers Electronic Signature(s) Signed: 10/07/2019 2:41:33 PM By: Lenda KelpStone III, Seymour Pavlak PA-C Entered By: Lenda KelpStone III, Sausha Raymond on 10/07/2019 14:41:33 -------------------------------------------------------------------------------- Debridement Details Patient Name: Date of Service: [name], [name] 10/07/2019 1:15 PM Medical Record [id number] Patient Account Number: [id number] Date of Birth/Sex: Treating RN: 1960/11/10 (59 y.o. [name]) Boehlein, Linda Primary Care Provider: Docia ChuckKoirala, Dibas Other Clinician: Referring Provider: Treating Provider/Extender:Stone III, Lizabeth LeydenHoyt Koirala, Dibas Weeks in Treatment: 1 Debridement Performed for Wound #2 Left,Anterior Lower Leg Assessment: Performed By: Clinician Yevonne PaxEpps, Carrie, RN Debridement Type: Chemical/Enzymatic/Mechanical Agent Used: Santyl Severity of Tissue Pre Fat layer exposed Debridement: Level of Consciousness (Pre- Awake and Alert procedure): Pre-procedure Verification/Time Out Taken: Yes - 14:46 Bleeding: None Response to Treatment: Procedure was tolerated well Level of Consciousness Awake and Alert (Post-procedure): Post Debridement Measurements of Total Wound Length: (cm) 5.3 Width: (cm) 3.5 Depth: (cm) 0.2 Volume: (cm) 2.914 Character of Wound/Ulcer Post Requires Further Debridement Debridement: Severity of Tissue Post Debridement: Fat layer exposed Post Procedure Diagnosis Same as Pre-procedure Electronic Signature(s) Signed: 10/07/2019  6:36:45 PM By: Zenaida DeedBoehlein, Linda RN, BSN Signed: 10/07/2019 7:52:58 PM By: Lenda KelpStone III, Jayah Balthazar PA-C Entered By: Zenaida DeedBoehlein, Linda on 10/07/2019 14:47:10 -------------------------------------------------------------------------------- Debridement Details Patient Name: Date of Service: [name], [name] 10/07/2019 1:15 PM Medical Record [id number] Patient Account Number: [id number] Date of Birth/Sex: Treating RN: 1960/11/10 (59 y.o. [name]) Boehlein, Linda Primary Care Provider: Docia ChuckKoirala, Dibas Other Clinician: Referring Provider: Treating Provider/Extender:Stone III, Lizabeth LeydenHoyt Koirala, Dibas Weeks in Treatment: 1 Debridement Performed for Wound #3 Left,Lateral Lower Leg Assessment: Performed By: Clinician Yevonne PaxEpps, Carrie, RN Debridement Type: Chemical/Enzymatic/Mechanical Agent Used: Santyl Severity of Tissue Pre Fat layer exposed Debridement: Level of Consciousness (Pre- Awake and Alert procedure): Pre-procedure Verification/Time Out Taken: Yes - 14:46 Bleeding: None Response to Treatment: Procedure was tolerated well Level of Consciousness Awake and Alert (Post-procedure): Post Debridement Measurements of Total Wound Length: (cm) 0.6 Width: (cm) 0.3 Depth: (cm) 0.1 Volume: (cm) 0.014 Character of Wound/Ulcer Post Requires Further Debridement Debridement: Severity of Tissue Post Debridement: Fat layer exposed Post Procedure Diagnosis Same as Pre-procedure Electronic Signature(s) Signed: 10/07/2019 6:36:45 PM By: Zenaida DeedBoehlein, Linda RN, BSN Signed: 10/07/2019 7:52:58 PM By: Lenda KelpStone III, Sandro Burgo PA-C Entered By: Zenaida DeedBoehlein, Linda on 10/07/2019 14:47:35 -------------------------------------------------------------------------------- HPI Details Patient Name: Date of Service: [name], Cheyenne B. 10/07/2019 1:15 PM Medical Record [id number] Patient Account Number: [id number] Date of Birth/Sex: Treating RN: 1960/11/10 (59 y.o. F) Primary Care Provider: Docia ChuckKoirala, Dibas Other Clinician: Referring  Provider: Treating Provider/Extender:Stone III, Lizabeth LeydenHoyt Koirala, Dibas Weeks in Treatment: 1 History of Present Illness HPI Description: 09/30/2019 upon evaluation today patient presents for initial inspection here in our clinic concerning issues that she has been experiencing with a wound on her left anterior lower leg. She unfortunately had a significant laceration which required 19 sutures and she shows me the pictures both before as well as after sutured in a very good job was done in this regard. Unfortunately the skin did not take and necrosis. Subsequently this wound open and she has been having more significant issues with that since. Fortunately there is no evidence of active infection at this time which is good news. No  fever chills noted. She has a history of chronic venous stasis but is no longer able to wear any compression in fact she cannot even wear regular socks due to her neuropathy and the pain that she experiences in her feet if she does. For that reason she tries to elevate her legs as much as she can at nighttime but again she really cannot wear any type of compression. With regard to antibiotic it does not appear that she has been on anything recently. 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 Fortunately there is no evidence of local or systemic infection. No fevers, chills, nausea, vomiting, or diarrhea. 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 10/07/2019 on evaluation today patient presents for follow-up concerning her wound on the left lower extremity. She has been tolerating the dressing changes without complication. With that being said she just got the Santyl 2 days ago. Obviously it has not had enough time to really do what we need  to do she would like to obviously give this some time before proceeding with more aggressive debridement or otherwise. Electronic Signature(s) Signed: 10/07/2019 2:50:47 PM By: Worthy Keeler PA-C Entered By: Worthy Keeler on 10/07/2019 14:50:46 -------------------------------------------------------------------------------- Physical Exam Details Patient Name: Date of Service: FATE, GALANTI 10/07/2019 1:15 PM Medical Record [id number] Patient Account Number: [id number] Date of Birth/Sex: Treating RN: 06/26/1961 (59 y.o. F) Primary Care Provider: Other Clinician: Koirala, Dibas Referring Provider: Treating Provider/Extender:Stone III, Sharen Counter, Dibas Weeks in Treatment: 1 Constitutional Well-nourished and well-hydrated in no acute distress. Respiratory normal breathing without difficulty. Psychiatric this patient is able to make decisions and demonstrates good insight into disease process. Alert and Oriented x 3. pleasant and cooperative. Notes Patient's wound currently showed signs again of some necrotic tissue this is still somewhat dry the Santyl really has not had time to effectively work yet she would like to give this a little bit of time obviously I think that is important as well. With that being said I do think we can go ahead and initiate the Santyl which she started 2 days ago to be continued at this point we will hold off on any sharp debridement today Electronic Signature(s) Signed: 10/07/2019 2:51:16 PM By: Worthy Keeler PA-C Entered By: Worthy Keeler on 10/07/2019 14:51:14 -------------------------------------------------------------------------------- Physician Orders Details Patient Name: Date of Service: KAMYLA, OLEJNIK 10/07/2019 1:15 PM Medical Record [id number] Patient Account Number: [id number] Date of Birth/Sex: Treating RN: Nov 25, 1960 (59 y.o. 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 Dressing Change Frequency Wound #2 Left,Anterior Lower Leg Change dressing every day. Wound #3 Left,Lateral Lower Leg Change dressing every day. Wound Cleansing Wound #2 Left,Anterior Lower Leg May shower and wash  wound with soap and water. - scrub gently with gauze Wound #3 Left,Lateral Lower Leg May shower and wash wound with soap and water. - scrub gently with gauze Primary Wound Dressing Wound #2 Left,Anterior Lower Leg Santyl Ointment - saline moistened gauze over santyl Wound #3 Left,Lateral Lower Leg Santyl Ointment Secondary Dressing Wound #2 Left,Anterior Lower Leg Kerlix/Rolled Gauze Telfa (non adherent pad) Wound #3 Left,Lateral Lower Leg Kerlix/Rolled Gauze Telfa (non adherent pad) Edema Control Avoid standing for long periods of time Elevate legs to the level of the heart or above for 30 minutes daily and/or when sitting, a frequency of: - throughout the day Exercise regularly Electronic Signature(s) Signed: 10/07/2019 6:36:45 PM By: Zenaida DeedBoehlein, Linda RN, BSN Signed: 10/07/2019 7:52:58 PM By: Lenda KelpStone III, Quantisha Marsicano PA-C Entered By: Zenaida DeedBoehlein, Linda on 10/07/2019 14:48:54 -------------------------------------------------------------------------------- Problem List Details Patient Name: Date of Service: [name], Merida B. 10/07/2019 1:15 PM Medical Record [id number] Patient  Account Number: [id number] Date of Birth/Sex: Treating RN: December 01, 1960 (59 y.o. 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 10/07/2019 1:15 PM Medical Record [id number] Patient Account Number: [id number] Date of Birth/Sex: Treating RN: December 01, 1960 (59 y.o. F) Primary Care Provider: Docia ChuckKoirala, Dibas Other Clinician: Referring Provider: Treating Provider/Extender:Stone III, Lizabeth LeydenHoyt Koirala, Dibas Weeks in Treatment: 1 Subjective Chief Complaint Information obtained from Patient Left LE Ulcers History of Present Illness (HPI) 09/30/2019 upon evaluation today patient presents for initial inspection here in our clinic concerning issues that she has been experiencing with a wound on her left anterior lower leg. She unfortunately had a significant laceration which required 19 sutures and she shows me the pictures both before as well as after sutured in a very good job was done in this regard.  Unfortunately the skin did not take and necrosis. Subsequently this wound open and she has been having more significant issues with that since. Fortunately there is no evidence of active infection at this time which is good news. No fever chills noted. She has a history of chronic venous stasis but is no longer able to wear any compression in fact she cannot even wear regular socks due to her neuropathy and the pain that she experiences in her feet if she does. For that reason she tries to elevate her legs as much as she can at nighttime but again she really cannot wear any type of compression. With regard to antibiotic it does not appear that she has been on anything recently. 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 Fortunately there is no evidence  of local or systemic infection. No fevers, chills, nausea, vomiting, or diarrhea. 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 10/07/2019 on evaluation today patient presents for follow-up concerning her wound on the left lower extremity. She has been tolerating the dressing changes without complication. With that being said she just got the Santyl 2 days ago. Obviously it has not had enough time to really do what we need to do she would like to obviously give this some time before proceeding with more aggressive debridement or otherwise. Objective Constitutional Well-nourished and well-hydrated in no acute distress. Vitals Time Taken: 2:01 PM, Height: 66 in, Weight: 156 lbs, BMI: 25.2, Temperature: 98.3 F, Pulse: 78 bpm, Respiratory Rate: 18 breaths/min, Blood Pressure: 129/84 mmHg. Respiratory normal breathing without difficulty. Psychiatric this patient is able to make decisions and demonstrates good insight  into disease process. Alert and Oriented x 3. pleasant and cooperative. General Notes: Patient's wound currently showed signs again of some necrotic tissue this is still somewhat dry the Santyl really has not had time to effectively work yet she would like to give this a little bit of time obviously I think that is important as well. With that being said I do think we can go ahead and initiate the Santyl which she started 2 days ago to be continued at this point we will hold off on any sharp debridement today Integumentary (Hair, Skin) Wound #2 status is Open. Original cause of wound was Trauma. The wound is located on the Left,Anterior Lower Leg. The wound measures 5.3cm length x 3.5cm width x 0.2cm depth; 14.569cm^2 area and 2.914cm^3 volume. There is Fat Layer (Subcutaneous Tissue) Exposed exposed. There is no tunneling or undermining noted. There is a medium amount of serosanguineous drainage noted. The wound margin is distinct with the outline attached to the wound base. There is no granulation within the wound bed. There is a large (67-100%) amount of necrotic tissue within the wound bed including Adherent Slough. Wound #3 status is Open. Original cause of wound was Trauma. The wound is located on the Left,Lateral Lower Leg. The wound measures 0.6cm length x 0.3cm width x 0.1cm depth; 0.141cm^2 area and 0.014cm^3 volume. There is no tunneling or undermining noted. There is a medium amount of serosanguineous drainage noted. The wound margin is distinct with the outline attached to the wound base. There is no granulation within the wound bed. There is a large (67-100%) amount of necrotic tissue within the wound bed including Adherent Slough. Assessment Active Problems ICD-10 Venous insufficiency (chronic) (peripheral) Non-pressure chronic ulcer of other part of left lower leg with fat layer exposed Non-pressure chronic ulcer of other part of left lower leg with other specified  severity Sicca syndrome with keratoconjunctivitis Raynaud's syndrome without gangrene Hereditary and idiopathic neuropathy, unspecified Procedures Wound #2 Pre-procedure diagnosis of Wound #2 is a Venous Leg Ulcer located on the Left,Anterior Lower Leg .Severity of Tissue Pre Debridement is: Fat layer exposed. There was a Chemical/Enzymatic/Mechanical debridement performed by Yevonne Pax, RN.Marland Kitchen Agent used was [employer]. A time out was conducted at 14:46, prior to the start of the procedure. There was no bleeding. The procedure was tolerated well. Post Debridement Measurements: 5.3cm length x 3.5cm width x 0.2cm depth; 2.914cm^3 volume. Character of Wound/Ulcer Post Debridement requires further debridement. Severity of Tissue Post Debridement is: Fat layer exposed. Post procedure Diagnosis Wound #2: Same as Pre-Procedure Wound #3 Pre-procedure diagnosis of Wound #3 is a Venous Leg Ulcer  located on the Left,Lateral Lower Leg .Severity of Tissue Pre Debridement is: Fat layer exposed. There was a Chemical/Enzymatic/Mechanical debridement performed by Yevonne Pax, RN.Marland Kitchen Agent used was [employer]. A time out was conducted at 14:46, prior to the start of the procedure. There was no bleeding. The procedure was tolerated well. Post Debridement Measurements: 0.6cm length x 0.3cm width x 0.1cm depth; 0.014cm^3 volume. Character of Wound/Ulcer Post Debridement requires further debridement. Severity of Tissue Post Debridement is: Fat layer exposed. Post procedure Diagnosis Wound #3: Same as Pre-Procedure Plan Follow-up Appointments: Return Appointment in 2 weeks. Dressing Change Frequency: Wound #2 Left,Anterior Lower Leg: Change dressing every day. Wound #3 Left,Lateral Lower Leg: Change dressing every day. Wound Cleansing: Wound #2 Left,Anterior Lower Leg: May shower and wash wound with soap and water. - scrub gently with gauze Wound #3 Left,Lateral Lower Leg: May shower and wash wound with soap and  water. - scrub gently with gauze Primary Wound Dressing: Wound #2 Left,Anterior Lower Leg: Santyl Ointment - saline moistened gauze over santyl Wound #3 Left,Lateral Lower Leg: Santyl Ointment Secondary Dressing: Wound #2 Left,Anterior Lower Leg: Kerlix/Rolled Gauze Telfa (non adherent pad) Wound #3 Left,Lateral Lower Leg: Kerlix/Rolled Gauze Telfa (non adherent pad) Edema Control: Avoid standing for long periods of time Elevate legs to the level of the heart or above for 30 minutes daily and/or when sitting, a frequency of: - throughout the day Exercise regularly 1. I would recommend again continuation of the Santyl for her. I think that still the best option at this time. 2. I am in a suggest as well that we also continue with the saline moistened gauze to cover followed by a Telfa or ABD pad to cover and then Curlex to secure in place. This will keep any adhesive off of her skin. 3. I am also going to suggest that we go ahead and continue with the Velcro compression socks. I do think she needs to elevate her legs much as possible as well. Obviously the more that she can control the edema the better things to do for her at this point. We will see patient back for reevaluation in 2 weeks here in the clinic. If anything worsens or changes patient will contact our office for additional recommendations. Electronic Signature(s) Signed: 10/07/2019 2:52:36 PM By: Lenda Kelp PA-C Entered By: Lenda Kelp on 10/07/2019 14:52:35 -------------------------------------------------------------------------------- SuperBill Details Patient Name: Date of Service: Cheyenne Lemons 10/07/2019 Medical Record [id number] Patient Account Number: [id number] Date of Birth/Sex: Treating RN: September 18, 1960 (59 y.o. 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, unspecified Facility Procedures CPT4 Code: [id number] Description: [id number] - DEBRIDE W/O ANES NON SELECT Modifier: Quantity: 1 Physician Procedures CPT4 Code Description: [id number] - WC PHYS LEVEL 3 - EST PT ICD-10 Diagnosis Description I87.2 Venous insufficiency (chronic) (peripheral) L97.822 Non-pressure chronic ulcer of other part of left lower l L97.828 Non-pressure chronic ulcer of other  part of left lower l severity 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

## 2019-10-13 ENCOUNTER — Encounter: Payer: Self-pay | Admitting: Neurology

## 2019-10-14 ENCOUNTER — Telehealth: Payer: Self-pay | Admitting: *Deleted

## 2019-10-14 NOTE — Telephone Encounter (Signed)
I did enter her covid vacc dates per pt request.

## 2019-10-21 ENCOUNTER — Other Ambulatory Visit: Payer: Self-pay

## 2019-10-21 ENCOUNTER — Encounter (HOSPITAL_BASED_OUTPATIENT_CLINIC_OR_DEPARTMENT_OTHER): Payer: Worker's Compensation | Attending: Physician Assistant | Admitting: Physician Assistant

## 2019-10-21 DIAGNOSIS — L97822 Non-pressure chronic ulcer of other part of left lower leg with fat layer exposed: Secondary | ICD-10-CM | POA: Diagnosis not present

## 2019-10-21 NOTE — Progress Notes (Addendum)
LINDE, [name] ([id number]) Visit Report for 10/21/2019 Chief Complaint Document Details Patient Name: Date of Service: [name], [name] 10/21/2019 3:00 PM Medical Record [id number] Patient Account Number: [id number] Date of Birth/Sex: Treating RN: [birth year] (59 y.o. [name] Primary Care Provider: Docia Chuck, Dibas Other Clinician: Referring Provider: Treating Provider/Extender:Stone III, Lizabeth Leyden, Dibas Weeks in Treatment: 3 Information Obtained from: Patient Chief Complaint Left LE Ulcers Electronic Signature(s) Signed: 10/21/2019 3:52:44 PM By: Lenda Kelp PA-C Entered By: Lenda Kelp on 10/21/2019 15:52:43 -------------------------------------------------------------------------------- Debridement Details Patient Name: Date of Service: Cheyenne Aran B. 10/21/2019 3:00 PM Medical Record [id number] Patient Account Number: [id number] Date of Birth/Sex: Treating RN: 04/01/1961 (59 y.o. [name] Primary Care Provider: Docia Chuck, Dibas Other Clinician: Referring Provider: Treating Provider/Extender:Stone III, Lizabeth Leyden, Dibas Weeks in Treatment: 3 Debridement Performed for Wound #2 Left,Anterior Lower Leg Assessment: Performed By: Physician Lenda Kelp, PA Debridement Type: Debridement Severity of Tissue Pre Fat layer exposed Debridement: Level of Consciousness (Pre- Awake and Alert procedure): Pre-procedure Verification/Time Out Taken: Yes - 17:05 Start Time: 17:05 Pain Control: Other : benzocaine 20% spray Total Area Debrided (L x W): 3 (cm) x 4 (cm) = 12 (cm) Tissue and other material Viable, Non-Viable, Slough, Subcutaneous, Slough debrided: Level: Skin/Subcutaneous Tissue Debridement Description: Excisional Instrument: Curette Bleeding: Minimum Hemostasis Achieved: Pressure Procedural Pain: 6 Post Procedural Pain: 4 Response to Treatment: Procedure was tolerated well Level of Consciousness Awake and  Alert (Post-procedure): Post Debridement Measurements of Total Wound Length: (cm) 3 Width: (cm) 4 Depth: (cm) 0.1 Volume: (cm) 0.942 Character of Wound/Ulcer Post Improved Debridement: Severity of Tissue Post Debridement: Fat layer exposed Post Procedure Diagnosis Same as Pre-procedure Electronic Signature(s) Signed: 10/22/2019 12:37:21 PM By: Lenda Kelp PA-C Signed: 10/23/2019 6:14:01 PM By: Zenaida Deed RN, BSN Entered By: Zenaida Deed on 10/21/2019 17:07:39 -------------------------------------------------------------------------------- HPI Details Patient Name: Date of Service: Cheyenne Aran B. 10/21/2019 3:00 PM Medical Record [id number] Patient Account Number: [id number] Date of Birth/Sex: Treating RN: 05-May-1961 (59 y.o. [name] Primary Care Provider: Docia Chuck, Dibas Other Clinician: Referring Provider: Treating Provider/Extender:Stone III, Lizabeth Leyden, Dibas Weeks in Treatment: 3 History of Present Illness HPI Description: 09/30/2019 upon evaluation today patient presents for initial inspection here in our clinic concerning issues that she has been experiencing with a wound on her left anterior lower leg. She unfortunately had a significant laceration which required 19 sutures and she shows me the pictures both before as well as after sutured in a very good job was done in this regard. Unfortunately the skin did not take and necrosis. Subsequently this wound open and she has been having more significant issues with that since. Fortunately there is no evidence of active infection at this time which is good news. No fever chills noted. She has a history of chronic venous stasis but is no longer able to wear any compression in fact she cannot even wear regular socks due to her neuropathy and the pain that she experiences in her feet if she does. For that reason she tries to elevate her legs as much as she can at nighttime but again she really  cannot wear any type of compression. With regard to antibiotic it does not appear that she has been on anything recently. 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 Fortunately there is no evidence of local or systemic infection. No fevers, chills, nausea, vomiting, or diarrhea. 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 10/07/2019 on evaluation today patient presents for follow-up concerning her wound on the left lower extremity. She has been tolerating the dressing changes without complication. With that being said she just got the Santyl 2 days ago. Obviously it has not had enough time to really do what we need to do she would like to obviously give this some time before proceeding with more aggressive debridement or otherwise. 10/21/2019 upon evaluation today patient appears to be doing better with regard to the overall appearance of her wound is not quite as dry I do believe the Santyl has helped her improve to some degree here. Fortunately there is no signs of active infection at this time. I think we may be able to clean off the wound with a little bit of additional debridement today and likely we can transition away from the Santyl to some kind of collagen type dressing. Electronic Signature(s) Signed: 10/21/2019 5:12:27 PM By: Lenda Kelp PA-C Entered By: Lenda Kelp on 10/21/2019 17:12:27 -------------------------------------------------------------------------------- Physical Exam Details Patient Name: Date of Service: Cheyenne, Richardson 10/21/2019 3:00 PM Medical Record [id number] Patient Account Number: [id number] Date of Birth/Sex: Treating RN: 06-Sep-1960 (59 y.o. [name] Primary Care Provider: Docia Chuck, Dibas Other  Clinician: Referring Provider: Treating Provider/Extender:Stone III, Lizabeth Leyden, Dibas Weeks in Treatment: 3 Constitutional Well-nourished and well-hydrated in no acute distress. Respiratory normal breathing without difficulty. Psychiatric this patient is able to make decisions and demonstrates good insight into disease process. Alert and Oriented x 3. pleasant and cooperative. Notes Patient's wound bed currently did require sharp debridement I performed this today without significant pain or complication she did well she did have some pain mainly near the proximal aspect of the wound but I was very careful around this area and the end I was able to get it nice and clean I think we are ready to apply collagen to the wound bed. Electronic Signature(s) Signed: 10/21/2019 5:12:45 PM By: Lenda Kelp PA-C Entered By: Lenda Kelp on 10/21/2019 17:12:45 -------------------------------------------------------------------------------- Physician Orders Details Patient Name: Date of Service: LINDZY, RUPERT 10/21/2019 3:00 PM Medical Record [id number] Patient Account Number: [id number] Date of Birth/Sex: Treating RN: 1960/10/17 (59 y.o. 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 Dressing Change Frequency Wound #2 Left,Anterior Lower  Leg Change Dressing every other day. Wound #3 Left,Lateral Lower Leg Change Dressing every other day. Wound Cleansing Wound #2 Left,Anterior Lower Leg May shower and wash wound with soap and water. - scrub gently with gauze Wound #3 Left,Lateral Lower Leg May shower and wash wound with soap and water. - scrub gently with gauze Primary Wound Dressing Wound #2 Left,Anterior Lower Leg Silver Collagen - moisten with hydrogel or KY gel Wound #3 Left,Lateral Lower Leg Silver Collagen - moisten  with hydrogel or KY gel Secondary Dressing Wound #2 Left,Anterior Lower Leg Kerlix/Rolled Gauze Telfa (non adherent pad) Wound #3 Left,Lateral Lower Leg Kerlix/Rolled Gauze Telfa (non adherent pad) Edema Control Avoid standing for long periods of time Elevate legs to the level of the heart or above for 30 minutes daily and/or when sitting, a frequency of: - throughout the day Exercise regularly Patient Medications Allergies: doxycycline, Lyrica, Vivelle, Aleve, Motrin, adhesive bandage Notifications Medication Indication Start End benzocaine prior to 10/21/2019 debridement DOSE topical 20 % aerosol - aerosol topical Electronic Signature(s) Signed: 10/22/2019 12:37:21 PM By: Lenda Kelp PA-C Signed: 10/23/2019 6:14:01 PM By: Zenaida Deed RN, BSN Entered By: Zenaida Deed on 10/21/2019 17:11:16 -------------------------------------------------------------------------------- Problem List Details Patient Name: Date of Service: [name] 10/21/2019 3:00 PM Medical Record [id number] Patient Account Number: [id number] Date of Birth/Sex: Treating RN: [birth year] (59 y.o. 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 10/21/2019 3:00 PM Medical Record [id number] Patient Account Number: [id number] Date of Birth/Sex: Treating RN: 09/19/1960 (59 y.o. [name] Primary Care Provider: Docia Chuck, Dibas Other Clinician: Referring Provider: Treating Provider/Extender:Stone III, Lizabeth Leyden, Dibas Weeks in Treatment: 3 Subjective Chief Complaint Information obtained from Patient Left LE Ulcers History of Present Illness (HPI) 09/30/2019 upon evaluation today patient presents for initial inspection here in our clinic concerning issues that she has been experiencing with a wound on her left anterior lower leg. She unfortunately had a significant laceration which required 19 sutures and she shows me the pictures both before as well as after sutured in a very good job was done in this regard. Unfortunately the skin did not take and necrosis. Subsequently this wound open and she has been having more significant issues with that since. Fortunately there is no evidence of active infection at this time which is good news. No fever chills noted. She has a history of chronic venous stasis but is no longer able to wear any compression in fact she  cannot even wear regular socks due to her neuropathy and the pain that she experiences in her feet if she does. For that reason she tries to elevate her legs as much as she can at nighttime but again she really cannot wear any type of compression. With regard to antibiotic it does not appear that she has been on anything recently. 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 Fortunately there is no evidence of local or systemic infection. No fevers, chills, nausea, vomiting, or diarrhea. 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 10/07/2019 on evaluation today patient presents for follow-up concerning her wound on the left lower extremity. She has been tolerating the dressing changes without complication. With that being said she just got the Santyl 2 days ago. Obviously it has not had enough time to really do what we need to do she would like to obviously give this some time before proceeding with more aggressive debridement or otherwise. 10/21/2019 upon evaluation today patient appears to be doing better with regard to the overall appearance of her wound is not quite as dry I do believe the Santyl has helped her improve to some degree here. Fortunately there is no signs of active infection at this time. I think we may be able to clean off the wound with a little bit of additional debridement today and likely we can transition away from the Santyl to some kind of collagen type dressing. Objective Constitutional Well-nourished and well-hydrated in no acute distress. Vitals Time Taken: 4:00 PM, Height: 66 in, Weight: 156 lbs, BMI: 25.2, Temperature: 98.1 F, Pulse: 71 bpm, Respiratory Rate: 16 breaths/min, Blood Pressure: 150/89 mmHg. Respiratory normal breathing  without difficulty. Psychiatric this patient is able to make decisions and demonstrates good insight into disease process. Alert and Oriented x 3. pleasant and cooperative. General Notes: Patient's wound bed currently did require sharp debridement I performed this today without significant pain or complication she did well she did have some pain mainly near the proximal aspect of the wound but I was very careful around this area and the end I was able to get it nice and clean I think we are ready to apply collagen to the wound bed. Integumentary (Hair, Skin) Wound #2 status is Open. Original cause of wound was Trauma. The wound is located on the Left,Anterior Lower Leg. The wound measures 3cm length x 4cm width x 0.2cm depth; 9.425cm^2 area and 1.885cm^3 volume. There is Fat Layer (Subcutaneous Tissue) Exposed exposed. There is no tunneling or undermining noted. There is a medium amount of serosanguineous drainage noted. The wound margin is distinct with the outline attached to the wound base. There is small (1-33%) pink, pale granulation within the wound bed. There is a large (67-100%) amount of necrotic tissue within the wound bed including Adherent Slough. Wound #3 status is Open. Original cause of wound was Trauma. The wound is located on the Left,Lateral Lower Leg. The wound measures 0.6cm length x 0.3cm width x 0.1cm depth; 0.141cm^2 area and 0.014cm^3 volume. There is no tunneling or undermining noted. There is a medium amount of serosanguineous drainage noted. The wound margin is distinct with the outline attached to the wound base. There is no granulation within the wound bed. There is a large (67-100%) amount of necrotic tissue within the wound bed including Adherent Slough. Assessment Active Problems ICD-10 Venous insufficiency (chronic) (peripheral) Non-pressure chronic ulcer of other part of left lower leg with fat layer exposed Non-pressure chronic ulcer of other part of left  lower leg with other specified severity Sicca syndrome with keratoconjunctivitis Raynaud's syndrome without gangrene Hereditary and idiopathic neuropathy, unspecified Procedures Wound #2 Pre-procedure diagnosis of Wound #2 is a Venous Leg Ulcer located on the Left,Anterior Lower Leg .Severity of Tissue Pre Debridement is: Fat layer exposed. There was a Excisional Skin/Subcutaneous Tissue Debridement with a total area of 12 sq cm performed by Lenda Kelp, PA. With the following instrument(s): Curette to remove Viable and Non-Viable tissue/material.  Material removed includes Subcutaneous Tissue and Slough and after achieving pain control using Other (benzocaine 20% spray). No specimens were taken. A time out was conducted at 17:05, prior to the start of the procedure. A Minimum amount of bleeding was controlled with Pressure. The procedure was tolerated well with a pain level of 6 throughout and a pain level of 4 following the procedure. Post Debridement Measurements: 3cm length x 4cm width x 0.1cm depth; 0.942cm^3 volume. Character of Wound/Ulcer Post Debridement is improved. Severity of Tissue Post Debridement is: Fat layer exposed. Post procedure Diagnosis Wound #2: Same as Pre-Procedure Plan Follow-up Appointments: Return Appointment in 1 week. Dressing Change Frequency: Wound #2 Left,Anterior Lower Leg: Change Dressing every other day. Wound #3 Left,Lateral Lower Leg: Change Dressing every other day. Wound Cleansing: Wound #2 Left,Anterior Lower Leg: May shower and wash wound with soap and water. - scrub gently with gauze Wound #3 Left,Lateral Lower Leg: May shower and wash wound with soap and water. - scrub gently with gauze Primary Wound Dressing: Wound #2 Left,Anterior Lower Leg: Silver Collagen - moisten with hydrogel or KY gel Wound #3 Left,Lateral Lower Leg: Silver Collagen - moisten with hydrogel or KY gel Secondary Dressing: Wound #2 Left,Anterior Lower  Leg: Kerlix/Rolled Gauze Telfa (non adherent pad) Wound #3 Left,Lateral Lower Leg: Kerlix/Rolled Gauze Telfa (non adherent pad) Edema Control: Avoid standing for long periods of time Elevate legs to the level of the heart or above for 30 minutes daily and/or when sitting, a frequency of: - throughout the day Exercise regularly The following medication(s) was prescribed: benzocaine topical 20 % aerosol aerosol topical for prior to debridement was prescribed at facility 1. My suggestion at this time is good to me that we go ahead and initiate treatment with a silver collagen dressing the patient is in agreement the plan. 2. I am also can recommend at this time that we go ahead and utilize hydrogel to moisten the collagen to try to keep things from drying out too much obviously the Telfa pad does help to some degree with this as well but again she just seems to teeter on the edge of the wound being too dry at times. 3. We will secure everything with rolled gauze to keep any tape adhesive off of her skin. We will see patient back for reevaluation in 1 week here in the clinic. If anything worsens or changes patient will contact our office for additional recommendations. Electronic Signature(s) Signed: 10/21/2019 5:13:28 PM By: Lenda KelpStone III, Yarissa Reining PA-C Entered By: Lenda KelpStone III, Kaelin Bonelli on 10/21/2019 17:13:28 -------------------------------------------------------------------------------- SuperBill Details Patient Name: Date of Service: Cheyenne LemonsSMITHEY, Cheyenne B. 10/21/2019 Medical Record [id number] Patient Account Number: [id number] Date of Birth/Sex: Treating RN: 1961-05-03 (59 y.o. 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, unspecified Facility Procedures CPT4 Code Description: [id number] 11042 - DEB SUBQ TISSUE 20 SQ CM/< ICD-10 Diagnosis Description L97.822 Non-pressure chronic ulcer of other part of left lower leg wi Modifier: th fat layer expo Quantity: 1 sed Physician Procedures CPT4 Code Description: [id number] 11042 - WC PHYS SUBQ TISS 20 SQ CM ICD-10 Diagnosis Description L97.822  Non-pressure chronic ulcer of other part of left lower leg wi Modifier: th fat layer expo Quantity: 1 sed Electronic Signature(s) Signed: 10/21/2019 5:13:33 PM By: Worthy Keeler PA-C Entered By: Worthy Keeler on 10/21/2019 17:13:32

## 2019-10-28 ENCOUNTER — Other Ambulatory Visit: Payer: Self-pay

## 2019-10-28 ENCOUNTER — Encounter (HOSPITAL_BASED_OUTPATIENT_CLINIC_OR_DEPARTMENT_OTHER): Payer: Worker's Compensation | Admitting: Physician Assistant

## 2019-10-28 DIAGNOSIS — L97822 Non-pressure chronic ulcer of other part of left lower leg with fat layer exposed: Secondary | ICD-10-CM | POA: Diagnosis not present

## 2019-10-28 NOTE — Progress Notes (Addendum)
[name], [name] ([id number]) Visit Report for 10/28/2019 Chief Complaint Document Details Patient Name: Date of Service: [name], [name] 10/28/2019 8:00 AM Medical Record [id number] Patient Account Number: [id number] Date of Birth/Sex: Treating RN: 06-11-1961 (59 y.o. [name] Standard Primary Care Provider: Docia Chuck, Dibas Other Clinician: Referring Provider: Treating Provider/Extender:Stone III, Lizabeth Leyden, Dibas Weeks in Treatment: 4 Information Obtained from: Patient Chief Complaint Left LE Ulcers Electronic Signature(s) Signed: 10/28/2019 8:20:52 AM By: Lenda Kelp PA-C Entered By: Lenda Kelp on 10/28/2019 08:20:52 -------------------------------------------------------------------------------- Debridement Details Patient Name: Date of Service: Cheyenne Aran B. 10/28/2019 8:00 AM Medical Record [id number] Patient Account Number: [id number] Date of Birth/Sex: Treating RN: Mar 19, 1961 (59 y.o. [name] Standard Primary Care Provider: Docia Chuck, Dibas Other Clinician: Referring Provider: Treating Provider/Extender:Stone III, Lizabeth Leyden, Dibas Weeks in Treatment: 4 Debridement Performed for Wound #2 Left,Anterior Lower Leg Assessment: Performed By: Physician Lenda Kelp, PA Debridement Type: Debridement Severity of Tissue Pre Fat layer exposed Debridement: Level of Consciousness (Pre- Awake and Alert procedure): Pre-procedure Verification/Time Out Taken: Yes - 09:10 Start Time: 09:12 Pain Control: Lidocaine 5% topical ointment Total Area Debrided (L x W): 5.2 (cm) x 4 (cm) = 20.8 (cm) Tissue and other material Viable, Non-Viable, Slough, Subcutaneous, Slough debrided: Level: Skin/Subcutaneous Tissue Debridement Description: Excisional Instrument: Curette Bleeding: Minimum Hemostasis Achieved: Pressure End Time: 09:16 Procedural Pain: 6 Post Procedural Pain: 3 Response to Treatment: Procedure was tolerated well Level of Consciousness  Awake and Alert (Post-procedure): Post Debridement Measurements of Total Wound Length: (cm) 5.2 Width: (cm) 4 Depth: (cm) 0.2 Volume: (cm) 3.267 Character of Wound/Ulcer Post Improved Debridement: Severity of Tissue Post Debridement: Fat layer exposed Post Procedure Diagnosis Same as Pre-procedure Electronic Signature(s) Signed: 10/28/2019 5:55:18 PM By: Lenda Kelp PA-C Signed: 10/28/2019 6:08:21 PM By: Zenaida Deed RN, BSN Entered By: Zenaida Deed on 10/28/2019 09:17:08 -------------------------------------------------------------------------------- HPI Details Patient Name: Date of Service: Cheyenne Aran B. 10/28/2019 8:00 AM Medical Record [id number] Patient Account Number: [id number] Date of Birth/Sex: Treating RN: 1960/10/13 (59 y.o. [name] Standard Primary Care Provider: Docia Chuck, Dibas Other Clinician: Referring Provider: Treating Provider/Extender:Stone III, Lizabeth Leyden, Dibas Weeks in Treatment: 4 History of Present Illness HPI Description: 09/30/2019 upon evaluation today patient presents for initial inspection here in our clinic concerning issues that she has been experiencing with a wound on her left anterior lower leg. She unfortunately had a significant laceration which required 19 sutures and she shows me the pictures both before as well as after sutured in a very good job was done in this regard. Unfortunately the skin did not take and necrosis. Subsequently this wound open and she has been having more significant issues with that since. Fortunately there is no evidence of active infection at this time which is good news. No fever chills noted. She has a history of chronic venous stasis but is no longer able to wear any compression in fact she cannot even wear regular socks due to her neuropathy and the pain that she experiences in her feet if she does. For that reason she tries to elevate her legs as much as she can at nighttime but again she  really cannot wear any type of compression. With regard to antibiotic it does not appear that she has been on anything recently. 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 Fortunately there is no evidence of local or systemic infection. No fevers, chills, nausea, vomiting, or diarrhea. 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 10/07/2019 on evaluation today patient presents for follow-up concerning her wound on the left lower extremity. She has been tolerating the dressing changes without complication. With that being said she just got the Santyl 2 days ago. Obviously it has not had enough time to really do what we need to do she would like to obviously give this some time before proceeding with more aggressive debridement or otherwise. 10/21/2019 upon evaluation today patient appears to be doing better with regard to the overall appearance of her wound is not quite as dry I do believe the Santyl has helped her improve to some degree here. Fortunately there is no signs of active infection at this time. I think we may be able to clean off the wound with a little bit of additional debridement today and likely we can transition away from the Santyl to some kind of collagen type dressing. 10/28/2019 upon evaluation today patient actually appears to be doing fairly well with regard to her wound today. She has been tolerating the dressing changes we did switch to collagen and fortunately that seems to be doing well for her at this point. There does not seem to be any signs of active infection I do feel like the surface of the wound is looking better at this time as well. Electronic Signature(s) Signed: 10/28/2019 9:22:13 AM By: Lenda KelpStone III, Fount Bahe PA-C Previous Signature:  10/28/2019 8:59:15 AM Version By: Lenda KelpStone III, Waynetta Metheny PA-C Previous Signature: 10/28/2019 8:58:00 AM Version By: Lenda KelpStone III, Paisleigh Maroney PA-C Entered By: Lenda KelpStone III, Theresia Pree on 10/28/2019 09:22:13 -------------------------------------------------------------------------------- Physical Exam Details Patient Name: Date of Service: Cheyenne LemonsSMITHEY, Cheyenne B. 10/28/2019 8:00 AM Medical Record [id number] Patient Account Number: [id number] Date of Birth/Sex: Treating RN: 03/10/1961 (59 y.o. [name] StandardF) Boehlein, Linda Primary Care Provider: Docia ChuckKoirala, Dibas Other Clinician: Referring Provider: Treating Provider/Extender:Stone III, Lizabeth LeydenHoyt Koirala, Dibas Weeks in Treatment: 4 Constitutional Well-nourished and well-hydrated in no acute distress. Respiratory normal breathing without difficulty. Psychiatric this patient is able to make decisions and demonstrates good insight into disease process. Alert and Oriented x 3. pleasant and cooperative. Notes His wound bed currently showed signs of fairly good granulation there was no signs of active infection I did have to go ahead and perform some sharp debridement today to remove necrotic tissue from the surface of the wound which the patient tolerated without complication post debridement the wound bed appears to be doing much better. Electronic Signature(s) Signed: 10/28/2019 9:22:31 AM By: Lenda KelpStone III, Klayten Jolliff PA-C Previous Signature: 10/28/2019 9:00:40 AM Version By: Lenda KelpStone III, Taraneh Metheney PA-C Previous Signature: 10/28/2019 8:58:30 AM Version By: Lenda KelpStone III, Amarii Amy PA-C Entered By: Lenda KelpStone III, Giovan Pinsky on 10/28/2019 09:22:31 -------------------------------------------------------------------------------- Physician Orders Details Patient Name: Date of Service: Cheyenne LemonsSMITHEY, Cheyenne B. 10/28/2019 8:00 AM Medical Record [id number] Patient Account Number: [id number] Date of Birth/Sex: Treating RN: 03/10/1961 (59 y.o. 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 Dressing Change Frequency Wound #2 Left,Anterior Lower Leg Change Dressing every other day. Wound #3 Left,Lateral Lower Leg Change Dressing every other day. Wound Cleansing Wound #2 Left,Anterior Lower Leg May shower and wash wound with soap and water. - scrub gently with gauze Wound #3 Left,Lateral Lower Leg May shower and wash wound with soap and water. - scrub gently with gauze Primary Wound Dressing Wound #2 Left,Anterior Lower Leg Silver Collagen - moisten with hydrogel or KY gel Wound #3 Left,Lateral Lower Leg Silver Collagen - moisten with hydrogel or KY gel Secondary Dressing Wound #2 Left,Anterior Lower Leg Kerlix/Rolled Gauze Telfa (non adherent pad) Wound #3 Left,Lateral Lower Leg Kerlix/Rolled Gauze Telfa (non adherent pad) Edema Control Avoid standing for long periods of time Elevate legs to the level of the heart or above for 30 minutes daily and/or when sitting, a frequency of: - throughout the day Exercise regularly Patient Medications Allergies: doxycycline, Lyrica, Vivelle, Aleve, Motrin, adhesive bandage Notifications Medication Indication Start End lidocaine prior to 10/28/2019 debridement DOSE topical 5 % ointment - ointment topical Electronic Signature(s) Signed: 10/28/2019 5:55:18 PM By: Lenda Kelp PA-C Signed: 10/28/2019 6:08:21 PM By: Zenaida Deed  RN, BSN Entered By: Zenaida Deed on 10/28/2019 09:20:25 -------------------------------------------------------------------------------- Problem List Details Patient Name: Date of Service: [name] 10/28/2019 8:00 AM Medical Record [id number] Patient Account Number: [id number] Date of Birth/Sex: Treating RN: [birth year] (59 y.o. 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 10/28/2019 8:00 AM Medical Record [id number] Patient Account Number: [id number] Date of Birth/Sex: Treating RN: 1961/05/15 (59 y.o. [name] Standard Primary Care Provider: Docia Chuck, Dibas Other Clinician: Referring Provider: Treating Provider/Extender:Stone III, Lizabeth Leyden, Dibas Weeks in Treatment: 4 Subjective Chief Complaint Information obtained from Patient Left LE Ulcers History of Present Illness (HPI) 09/30/2019 upon evaluation today patient presents for initial  inspection here in our clinic concerning issues that she has been experiencing with a wound on her left anterior lower leg. She unfortunately had a significant laceration which required 19 sutures and she shows me the pictures both before as well as after sutured in a very good job was done in this regard. Unfortunately the skin did not take and necrosis. Subsequently this wound open and she has been having more significant issues with that since. Fortunately there is no evidence of active infection at this time which is good news. No fever chills noted. She has a history of chronic venous stasis but is no longer able to wear any compression in fact she cannot even wear regular socks due to her neuropathy and the pain that she experiences in her feet if she does. For that reason she  tries to elevate her legs as much as she can at nighttime but again she really cannot wear any type of compression. With regard to antibiotic it does not appear that she has been on anything recently. 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 Fortunately there is no evidence of local or systemic infection. No fevers, chills, nausea, vomiting, or diarrhea. 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 10/07/2019 on evaluation today patient presents for follow-up concerning her wound on the left lower extremity. She has been tolerating the dressing changes without complication. With that being said she just got the Santyl 2 days ago. Obviously it has not had enough time to really do what we need to do she would like to obviously give this some time before proceeding with more aggressive debridement or otherwise. 10/21/2019 upon evaluation today patient appears to be doing better with regard  to the overall appearance of her wound is not quite as dry I do believe the Santyl has helped her improve to some degree here. Fortunately there is no signs of active infection at this time. I think we may be able to clean off the wound with a little bit of additional debridement today and likely we can transition away from the Santyl to some kind of collagen type dressing. 10/28/2019 upon evaluation today patient actually appears to be doing fairly well with regard to her wound today. She has been tolerating the dressing changes we did switch to collagen and fortunately that seems to be doing well for her at this point. There does not seem to be any signs of active infection I do feel like the surface of the wound is looking better at this time as well. Objective Constitutional Well-nourished and well-hydrated in no acute distress. Vitals Time Taken: 8:22 AM, Height: 66 in, Weight: 156 lbs, BMI: 25.2, Temperature: 97.7 F, Pulse: 78 bpm, Respiratory Rate: 16 breaths/min, Blood Pressure: 132/93 mmHg. Respiratory normal breathing without difficulty. Psychiatric this patient is able to make decisions and demonstrates good insight into disease process. Alert and Oriented x 3. pleasant and cooperative. General Notes: His wound bed currently showed signs of fairly good granulation there was no signs of active infection I did have to go ahead and perform some sharp debridement today to remove necrotic tissue from the surface of the wound which the patient tolerated without complication post debridement the wound bed appears to be doing much better. Integumentary (Hair, Skin) Wound #2 status is Open. Original cause of wound was Trauma. The wound is located on the Left,Anterior Lower Leg. The wound measures 5.2cm length x 4cm width x 0.2cm depth; 16.336cm^2 area and 3.267cm^3 volume. There is Fat Layer (Subcutaneous Tissue) Exposed exposed. There is no tunneling or undermining noted. There is  a medium amount of serous drainage noted. The wound margin is distinct with the outline attached to the wound base. There is medium (34-66%) pink granulation within the wound bed. There is a medium (34-66%) amount of necrotic tissue within the wound bed including Adherent Slough. Wound #3 status is Open. Original cause of wound was Trauma. The wound is located on the Left,Lateral Lower Leg. The wound measures 0.6cm length x 0.3cm width x  0.1cm depth; 0.141cm^2 area and 0.014cm^3 volume. There is Fat Layer (Subcutaneous Tissue) Exposed exposed. There is no tunneling or undermining noted. There is a small amount of serous drainage noted. The wound margin is distinct with the outline attached to the wound base. There is small (1-33%) pink granulation within the wound bed. There is a large (67-100%) amount of necrotic tissue within the wound bed including Adherent Slough. 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 There was a Excisional Skin/Subcutaneous Tissue Debridement with a total area of 20.8 sq cm performed by Lenda Kelp, PA. With the following instrument(s): Curette to remove Viable and Non-Viable tissue/material. Material removed includes Subcutaneous Tissue and Slough and after achieving pain control using Lidocaine 5% topical ointment. No specimens were taken. A time out was conducted at 09:10, prior to the start of the procedure. A Minimum amount of bleeding was controlled with  Pressure. The procedure was tolerated well with a pain level of 6 throughout and a pain level of 3 following the procedure. Post Debridement Measurements: 5.2cm length x 4cm width x 0.2cm depth; 3.267cm^3 volume. Character of Wound/Ulcer Post Debridement is improved. Severity of Tissue Post Debridement is: Fat layer exposed. Post procedure Diagnosis Wound #2: Same as Pre-Procedure Plan Follow-up Appointments: Return Appointment in 1 week. Dressing Change Frequency: Wound #2 Left,Anterior Lower Leg: Change Dressing every other day. Wound #3 Left,Lateral Lower Leg: Change Dressing every other day. Wound Cleansing: Wound #2 Left,Anterior Lower Leg: May shower and wash wound with soap and water. - scrub gently with gauze Wound #3 Left,Lateral Lower Leg: May shower and wash wound with soap and water. - scrub gently with gauze Primary Wound Dressing: Wound #2 Left,Anterior Lower Leg: Silver Collagen - moisten with hydrogel or KY gel Wound #3 Left,Lateral Lower Leg: Silver Collagen - moisten with hydrogel or KY gel Secondary Dressing: Wound #2 Left,Anterior Lower Leg: Kerlix/Rolled Gauze Telfa (non adherent pad) Wound #3 Left,Lateral Lower Leg: Kerlix/Rolled Gauze Telfa (non adherent pad) Edema Control: Avoid standing for long periods of time Elevate legs to the level of the heart or above for 30 minutes daily and/or when sitting, a frequency of: - throughout the day Exercise regularly The following medication(s) was prescribed: lidocaine topical 5 % ointment ointment topical for prior to debridement was prescribed at facility 1. My suggestion at this time is good to be that we go ahead and continue with the collagen dressing moistened with hydrogel she seems to be doing well in this regard. 2. I am also can recommend continuation of the rolled gauze to secure along with a Telfa nonadherent pad in order to prevent this from sticking and so there would not be any adhesive on her skin  she seems to be sensitive in that regard. 3. I really think she would benefit from a compression wrap but unfortunately due to her neuropathy were unable to do that at this point. We will see patient back for reevaluation in 1 week here in the clinic. If anything worsens or changes patient will contact our office for additional recommendations. Electronic Signature(s) Signed: 10/28/2019 9:23:27 AM By:  Lenda Kelp PA-C Previous Signature: 10/28/2019 9:23:13 AM Version By: Lenda Kelp PA-C Entered By: Lenda Kelp on 10/28/2019 09:23:27 -------------------------------------------------------------------------------- SuperBill Details Patient Name: Date of Service: [name], [name] 10/28/2019 Medical Record [id number] Patient Account Number: [id number] Date of Birth/Sex: Treating RN: [birth year] (59 y.o. 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, unspecified Facility Procedures CPT4 Code Description: [id number] 11042 - DEB SUBQ TISSUE 20 SQ CM/< ICD-10 Diagnosis Description L97.822 Non-pressure chronic ulcer of other part of left lower leg with Modifier: fat layer expose Quantity: 1 d CPT4 Code Description: [id number] 11045 - DEB SUBQ TISS EA ADDL 20CM ICD-10 Diagnosis Description L97.822 Non-pressure chronic ulcer of other part of left lower leg with f Modifier: at layer exposed Quantity: 1 Physician Procedures CPT4 Code  Description: [id number] 11042 - WC PHYS SUBQ TISS 20 SQ CM ICD-10 Diagnosis Description L97.822 Non-pressure chronic ulcer of other part of left lower leg Modifier: with fat laye Quantity: 1 r exposed CPT4 Code Description: [id number] 11045 - WC PHYS SUBQ TISS EA ADDL 20 CM ICD-10 Diagnosis Description L97.822 Non-pressure chronic ulcer of other part of left lower leg Modifier: with fat laye Quantity: 1 r exposed Electronic Signature(s) Signed: 10/28/2019 9:23:37 AM By: Lenda Kelp PA-C Entered By: Lenda Kelp on 10/28/2019 09:23:37

## 2019-10-29 NOTE — Progress Notes (Signed)
[name], [name] ([id number]) Visit Report for 10/21/2019 Arrival Information Details Patient Name: Date of Service: [name], [name] 10/21/2019 3:00 PM Medical Record [id number] Patient Account Number: [id number] Date of Birth/Sex: Treating RN: Dec 02, 1960 (59 y.o. [name] Primary Care Eileen Croswell: Docia Chuck, Dibas Other Clinician: Referring Huong Luthi: Treating Shammara Jarrett/Extender:Stone III, Lizabeth Leyden, Dibas Weeks in Treatment: 3 Visit Information History Since Last Visit Added or deleted any medications: No Patient Arrived: Ambulatory Any new allergies or adverse reactions: No Arrival Time: 16:00 Had a fall or experienced change in No Accompanied By: self activities of daily living that may affect Transfer Assistance: None risk of falls: Patient Identification Verified: Yes Signs or symptoms of abuse/neglect since last No Secondary Verification Process Yes visito Completed: Hospitalized since last visit: No Patient Requires Transmission-Based No Implantable device outside of the clinic excluding No Precautions: cellular tissue based products placed in the center Patient Has Alerts: Yes since last visit: Has Dressing in Place as Prescribed: Yes Pain Present Now: Yes Electronic Signature(s) Signed: 10/21/2019 5:44:15 PM By: Shawn Stall Entered By: Shawn Stall on 10/21/2019 16:00:28 -------------------------------------------------------------------------------- Encounter Discharge Information Details Patient Name: Date of Service: [name] 10/21/2019 3:00 PM Medical Record [id number] Patient Account Number: [id number] Date of Birth/Sex: Treating RN: Nov 29, 1960 (59 y.o. Wynelle Link Primary Care Tahni Porchia: Docia Chuck, Dibas Other Clinician: Referring Alwyn Cordner: Treating Haliegh Khurana/Extender:Stone III, Lizabeth Leyden, Dibas Weeks in Treatment: 3 Encounter Discharge Information Items Post Procedure Vitals Discharge Condition: Stable Temperature  (F): 98.1 Ambulatory Status: Ambulatory Pulse (bpm): 71 Discharge Destination: Home Respiratory Rate (breaths/min): 16 Transportation: Private Auto Blood Pressure (mmHg): 150/89 Accompanied By: alone Schedule Follow-up Appointment: No Clinical Summary of Care: Electronic Signature(s) Signed: 10/29/2019 8:56:47 AM By: Zandra Abts RN, BSN Entered By: Zandra Abts on 10/21/2019 17:30:08 -------------------------------------------------------------------------------- Lower Extremity Assessment Details Patient Name: Date of Service: [name] 10/21/2019 3:00 PM Medical Record [id number] Patient Account Number: [id number] Date of Birth/Sex: Treating RN: February 10, 1961 (59 y.o. [name] Primary Care Jenniferann Stuckert: [place], Dibas Other Clinician: Referring Temitayo Covalt: Treating Helia Haese/Extender:Stone III, Lizabeth Leyden, Dibas Weeks in Treatment: 3 Edema Assessment Assessed: [Left: Yes] [Right: No] Edema: [Left: Ye] [Right: s] Calf Left: Right: Point of Measurement: 29 cm From Medial Instep 35.5 cm cm Ankle Left: Right: Point of Measurement: 8 cm From Medial Instep 20.5 cm cm Vascular Assessment Pulses: Dorsalis Pedis Palpable: [Left:Yes] Electronic Signature(s) Signed: 10/21/2019 5:44:15 PM By: Shawn Stall Entered By: Shawn Stall on 10/21/2019 16:02:38 -------------------------------------------------------------------------------- Multi-Disciplinary Care Plan Details Patient Name: Date of Service: Cheyenne Aran B. 10/21/2019 3:00 PM Medical Record [id number] Patient Account Number: [id number] Date of Birth/Sex: Treating RN: Aug 30, 1961 (59 y.o. [name] Standard Primary Care Kaio Kuhlman: Docia Chuck, Dibas Other Clinician: Referring Valorie Mcgrory: Treating Landyn Buckalew/Extender:Stone III, Lizabeth Leyden, Dibas Weeks in Treatment: 3 Active Inactive Abuse / Safety / Falls / Self Care Management Nursing Diagnoses: Potential for falls Goals: Patient/caregiver will  verbalize/demonstrate measures taken to prevent injury and/or falls Date Initiated: 09/30/2019 Target Resolution Date: 10/28/2019 Goal Status: Active Interventions: Assess fall risk on admission and as needed Notes: Venous Leg Ulcer Nursing Diagnoses: Knowledge deficit related to disease process and management Potential for venous Insuffiency (use before diagnosis confirmed) Goals: Patient will maintain optimal edema control Date Initiated: 09/30/2019 Target Resolution Date: 10/28/2019 Goal Status: Active Patient/caregiver will verbalize understanding of disease process and disease management Date Initiated: 09/30/2019 Target Resolution Date: 10/28/2019 Goal Status: Active Interventions: Assess peripheral edema status every visit. Compression as ordered Provide education on venous insufficiency Treatment Activities: Therapeutic compression applied : 09/30/2019 Notes:  Wound/Skin Impairment Nursing Diagnoses: Impaired tissue integrity Knowledge deficit related to ulceration/compromised skin integrity Goals: Patient/caregiver will verbalize understanding of skin care regimen Date Initiated: 09/30/2019 Target Resolution Date: 10/28/2019 Goal Status: Active Ulcer/skin breakdown will have a volume reduction of 30% by week 4 Date Initiated: 09/30/2019 Target Resolution Date: 10/28/2019 Goal Status: Active Interventions: Assess patient/caregiver ability to obtain necessary supplies Assess patient/caregiver ability to perform ulcer/skin care regimen upon admission and as needed Assess ulceration(s) every visit Provide education on ulcer and skin care Treatment Activities: Skin care regimen initiated : 09/30/2019 Topical wound management initiated : 09/30/2019 Notes: Electronic Signature(s) Signed: 10/23/2019 6:14:01 PM By: Zenaida Deed RN, BSN Entered By: Zenaida Deed on 10/21/2019 17:05:15 -------------------------------------------------------------------------------- Pain  Assessment Details Patient Name: Date of Service: [name] 10/21/2019 3:00 PM Medical Record [id number] Patient Account Number: [id number] Date of Birth/Sex: Treating RN: 13-Oct-1960 (59 y.o. [name] Primary Care Donel Osowski: Docia Chuck, Dibas Other Clinician: Referring Nakoa Ganus: Treating Britton Perkinson/Extender:Stone III, Lizabeth Leyden, Dibas Weeks in Treatment: 3 Active Problems Location of Pain Severity and Description of Pain Patient Has Paino Yes Site Locations Pain Location: Pain in Ulcers Rate the pain. Current Pain Level: 2 Worst Pain Level: 10 Least Pain Level: 0 Tolerable Pain Level: 8 Character of Pain Describe the Pain: Burning Pain Management and Medication Current Pain Management: Medication: No Cold Application: No Rest: No Massage: No Activity: No T.E.N.S.: No Heat Application: No Leg drop or elevation: No Is the Current Pain Management Adequate: Adequate How does your wound impact your activities of daily livingo Sleep: No Bathing: No Appetite: No Relationship With Others: No Bladder Continence: No Emotions: No Bowel Continence: No Work: No Toileting: No Drive: No Dressing: No Hobbies: No Electronic Signature(s) Signed: 10/21/2019 5:44:15 PM By: Shawn Stall Entered By: Shawn Stall on 10/21/2019 16:00:57 -------------------------------------------------------------------------------- Patient/Caregiver Education Details Patient Name: Date of Service: [name], Cheyenne B. 2/17/2021andnbsp3:00 PM Medical Record [id number] Patient Account Number: [id number] Date of Birth/Gender: Treating RN: [birth year] (59 y.o. [name] Standard Primary Care Physician: Docia Chuck, Dibas Other Clinician: Referring Physician: Treating Physician/Extender:Stone III, Lizabeth Leyden, Dibas Weeks in Treatment: 3 Education Assessment Education Provided To: Patient Education Topics Provided Venous: Methods: Explain/Verbal Responses: Reinforcements  needed, State content correctly Wound Debridement: Methods: Explain/Verbal Responses: Reinforcements needed, State content correctly Wound/Skin Impairment: Methods: Explain/Verbal Responses: Reinforcements needed, State content correctly Electronic Signature(s) Signed: 10/23/2019 6:14:01 PM By: Zenaida Deed RN, BSN Entered By: Zenaida Deed on 10/21/2019 17:06:06 -------------------------------------------------------------------------------- Wound Assessment Details Patient Name: Date of Service: [name] 10/21/2019 3:00 PM Medical Record [id number] Patient Account Number: [id number] Date of Birth/Sex: Treating RN: November 16, 1960 (59 y.o. [name] Standard Primary Care Keiarra Charon: Docia Chuck, Dibas Other Clinician: Referring Jaxyn Rout: Treating Rhythm Wigfall/Extender:Stone III, Lizabeth Leyden, Dibas Weeks in Treatment: 3 Wound Status Wound Number: 2 Primary Venous Leg Ulcer Etiology: Wound Location: Left Lower Leg - Anterior Wound Status: Open Wounding Event: Trauma Comorbid Lymphedema, Raynauds, Date Acquired: 08/06/2019 History: Neuropathy Weeks Of Treatment: 3 Clustered Wound: No Photos Wound Measurements Length: (cm) 3 % Reduct Width: (cm) 4 % Reduct Depth: (cm) 0.2 Epitheli Area: (cm) 9.425 Tunneli Volume: (cm) 1.885 Undermi Wound Description Full Thickness Without Exposed Support Foul Od Classification: Structures Slough/ Wound Distinct, outline attached Margin: Exudate Medium Amount: Exudate Serosanguineous Type: Exudate red, brown Color: Wound Bed Granulation Amount: Small (1-33%) Granulation Quality: Pink, Pale Fascia E Necrotic Amount: Large (67-100%) Fat Laye Necrotic Quality: Adherent Slough Tendon E Muscle E Joint Ex Bone Exp Electronic Signature(s) Signed: 10/23/2019 5:06:31 PM By: Benjaman Kindler EMT/HBOT Signed:  10/23/2019 6:14:01 PM By: Baruch Gouty RN, BSN Previous Signature: 10/21/2019 5:44:15 PM Version By: Lady Deutscher Entered  By: Mikeal Hawthorne on 02/19 or After Cleansing: No Fibrino Yes Exposed Structure xposed: No r (Subcutaneous Tissue) Exposed: Yes xposed: No xposed: No posed: No osed: No i /2021 15:31:58 ion in Area: 48.3% ion in Volume: 48.3% alization: Small (1-33%) ng: No ning: No -------------------------------------------------------------------------------- Wound Assessment Details Patient Name: Date of Service: [name], [name] 10/21/2019 3:00 PM Medical Record [id number] Patient Account Number: [id number] Date of Birth/Sex: Treating RN: 26-Nov-1960 (59 y.o. [name] Primary Care Maricela Kawahara: [name], Dibas Other Clinician: Referring Milia Warth: Treating Aybree Lanyon/Extender:Stone III, Sharen Counter, Dibas Weeks in Treatment: 3 Wound Status Wound Number: 3 Primary Venous Leg Ulcer Etiology: Wound Location: Left Lower Leg - Lateral Wound Status: Open Wounding Event: Trauma Comorbid Lymphedema, Raynauds, Date Acquired: 08/06/2019 History: Neuropathy Weeks Of Treatment: 3 Clustered Wound: No Photos Wound Measurements Length: (cm) 0.6 % Reductio Width: (cm) 0.3 % Reductio Depth: (cm) 0.1 Epithelial Area: (cm) 0.141 Tunneling Volume: (cm) 0.014 Undermini Wound Description Classification: Unclassifiable Wound Margin: Distinct, outline attached Exudate Amount: Medium Exudate Type: Serosanguineous Exudate Color: red, brown Wound Bed Granulation Amount: None Present (0%) Necrotic Amount: Large (67-100%) Necrotic Quality: Adherent Slough Foul Odor After Cleansing: No Slough/Fibrino Yes Exposed Structure Fascia Exposed: No Fat Layer (Subcutaneous Tissue) Exposed: No Tendon Exposed: No Muscle Exposed: No Joint Exposed: No Bone Exposed: No n in Area: 55.1% n in Volume: 54.8% ization: None : No ng: No Electronic Signature(s) Signed: 10/23/2019 5:06:31 PM By: Mikeal Hawthorne EMT/HBOT Signed: 10/23/2019 6:14:01 PM By: Baruch Gouty RN, BSN Previous Signature:  10/21/2019 5:44:15 PM Version By: Deon Pilling Entered By: Mikeal Hawthorne on 10/23/2019 15:32:16 -------------------------------------------------------------------------------- [place] Details Patient Name: Date of Service: Cheyenne Mccallum B. 10/21/2019 3:00 PM Medical Record [id number] Patient Account Number: [id number] Date of Birth/Sex: Treating RN: 12/13/1960 (59 y.o. [name] Primary Care Jaxon Mynhier: [name], Dibas Other Clinician: Referring Wing Schoch: Treating Christionna Poland/Extender:Stone III, Sharen Counter, Dibas Weeks in Treatment: 3 Vital Signs Time Taken: 16:00 Temperature (F): 98.1 Height (in): 66 Pulse (bpm): 71 Weight (lbs): 156 Respiratory Rate (breaths/min): 16 Body Mass Index (BMI): 25.2 Blood Pressure (mmHg): 150/89 Reference Range: 80 - 120 mg / dl Electronic Signature(s) Signed: 10/21/2019 5:44:15 PM By: Deon Pilling Entered By: Deon Pilling on 10/21/2019 16:01:34

## 2019-11-03 NOTE — Progress Notes (Signed)
[name], [name] ([id number]) Visit Report for 10/28/2019 Arrival Information Details Patient Name: Date of Service: [name] 10/28/2019 8:00 AM Medical Record [id number] Patient Account Number: [id number] Date of Birth/Sex: Treating RN: 1960/11/06 (59 y.o. [name] Primary Care Christpoher Sievers: [name], Dibas Other Clinician: Referring Wojciech Willetts: Treating Story Vanvranken/Extender:Stone III, Sharen Counter, Dibas Weeks in Treatment: 4 Visit Information History Since Last Visit Added or deleted any medications: No Patient Arrived: Ambulatory Any new allergies or adverse reactions: No Arrival Time: 08:20 Had a fall or experienced change in No Accompanied By: alone activities of daily living that may affect Transfer Assistance: None risk of falls: Patient Identification Verified: Yes Signs or symptoms of abuse/neglect since last No Secondary Verification Process Yes visito Completed: Hospitalized since last visit: No Patient Requires Transmission-Based No Implantable device outside of the clinic excluding No Precautions: cellular tissue based products placed in the center Patient Has Alerts: Yes since last visit: Has Dressing in Place as Prescribed: Yes Pain Present Now: Yes Electronic Signature(s) Signed: 10/29/2019 8:56:01 AM By: Levan Hurst RN, BSN Entered By: Levan Hurst on 10/28/2019 08:20:25 -------------------------------------------------------------------------------- Encounter Discharge Information Details Patient Name: Date of Service: [name] 10/28/2019 8:00 AM Medical Record [id number] Patient Account Number: [id number] Date of Birth/Sex: Treating RN: 11-08-1960 (59 y.o. [name] Primary Care Emonni Depasquale: [name], Dibas Other Clinician: Referring Mariell Nester: Treating Roosvelt Churchwell/Extender:Stone III, Sharen Counter, Dibas Weeks in Treatment: 4 Encounter Discharge Information Items Post Procedure Vitals Discharge Condition:  Stable Temperature (F): 97.7 Ambulatory Status: Ambulatory Pulse (bpm): 78 Discharge Destination: Home Respiratory Rate (breaths/min): 16 Transportation: Private Auto Blood Pressure (mmHg): 132/93 Accompanied By: self Schedule Follow-up Appointment: Yes Clinical Summary of Care: Patient Declined Electronic Signature(s) Signed: 11/03/2019 9:14:02 AM By: Carlene Coria RN Entered By: Carlene Coria on 10/28/2019 09:35:29 -------------------------------------------------------------------------------- Lower Extremity Assessment Details Patient Name: Date of Service: [name] 10/28/2019 8:00 AM Medical Record [id number] Patient Account Number: [id number] Date of Birth/Sex: Treating RN: May 02, 1961 (59 y.o. [name] Primary Care Iesha Summerhill: [place], Dibas Other Clinician: Referring Alphons Burgert: Treating Kamryn Gauthier/Extender:Stone III, Sharen Counter, Dibas Weeks in Treatment: 4 Edema Assessment Assessed: [Left: No] [Right: No] Edema: [Left: Ye] [Right: s] Calf Left: Right: Point of Measurement: 29 cm From Medial Instep 34 cm cm Ankle Left: Right: Point of Measurement: 8 cm From Medial Instep 20 cm cm Vascular Assessment Pulses: Dorsalis Pedis Palpable: [Left:Yes] Electronic Signature(s) Signed: 10/29/2019 8:56:01 AM By: Levan Hurst RN, BSN Entered By: Levan Hurst on 10/28/2019 08:24:00 -------------------------------------------------------------------------------- [place] Details Patient Name: Date of Service: [name] 10/28/2019 8:00 AM Medical Record [id number] Patient Account Number: [id number] Date of Birth/Sex: Treating RN: 08-30-1961 (59 y.o. [name] Primary Care Aradia Estey: [name], Dibas Other Clinician: Referring Logan Baltimore: Treating Bryanda Mikel/Extender:Stone III, Sharen Counter, Dibas Weeks in Treatment: 4 Active Inactive Venous Leg Ulcer Nursing Diagnoses: Knowledge deficit related to disease process and  management Potential for venous Insuffiency (use before diagnosis confirmed) Goals: Patient will maintain optimal edema control Date Initiated: 09/30/2019 Target Resolution Date: 11/25/2019 Goal Status: Active Patient/caregiver will verbalize understanding of disease process and disease management Date Initiated: 09/30/2019 Target Resolution Date: 11/25/2019 Goal Status: Active Interventions: Assess peripheral edema status every visit. Compression as ordered Provide education on venous insufficiency Treatment Activities: Therapeutic compression applied : 09/30/2019 Notes: Wound/Skin Impairment Nursing Diagnoses: Impaired tissue integrity Knowledge deficit related to ulceration/compromised skin integrity Goals: Patient/caregiver will verbalize understanding of skin care regimen Date Initiated: 09/30/2019 Target Resolution Date: 11/25/2019 Goal Status: Active Ulcer/skin breakdown will have a volume reduction  of 30% by week 4 Date Initiated: 09/30/2019 Date Inactivated: 10/28/2019 Target Resolution Date: 10/28/2019 Unmet Reason: other Goal Status: Unmet comorbidities Interventions: Assess patient/caregiver ability to obtain necessary supplies Assess patient/caregiver ability to perform ulcer/skin care regimen upon admission and as needed Assess ulceration(s) every visit Provide education on ulcer and skin care Treatment Activities: Skin care regimen initiated : 09/30/2019 Topical wound management initiated : 09/30/2019 Notes: Electronic Signature(s) Signed: 10/28/2019 6:08:21 PM By: Zenaida Deed RN, BSN Entered By: Zenaida Deed on 10/28/2019 09:07:24 -------------------------------------------------------------------------------- Pain Assessment Details Patient Name: Date of Service: [name] 10/28/2019 8:00 AM Medical Record [id number] Patient Account Number: [id number] Date of Birth/Sex: Treating RN: 04-Aug-1961 (59 y.o. Wynelle Link Primary Care Andrej Spagnoli:  Docia Chuck, Dibas Other Clinician: Referring Ashlyne Olenick: Treating Baran Kuhrt/Extender:Stone III, Lizabeth Leyden, Dibas Weeks in Treatment: 4 Active Problems Location of Pain Severity and Description of Pain Patient Has Paino Yes Site Locations Pain Location: Pain in Ulcers With Dressing Change: Yes Duration of the Pain. Constant / Intermittento Intermittent Rate the pain. Current Pain Level: 4 Character of Pain Describe the Pain: Burning Pain Management and Medication Current Pain Management: Medication: Yes Cold Application: No Rest: No Massage: No Activity: No T.E.N.S.: No Heat Application: No Leg drop or elevation: No Is the Current Pain Management Adequate: Adequate How does your wound impact your activities of daily livingo Sleep: No Bathing: No Appetite: No Relationship With Others: No Bladder Continence: No Emotions: No Bowel Continence: No Work: No Toileting: No Drive: No Dressing: No Hobbies: No Electronic Signature(s) Signed: 10/29/2019 8:56:01 AM By: Zandra Abts RN, BSN Entered By: Zandra Abts on 10/28/2019 08:20:49 -------------------------------------------------------------------------------- Patient/Caregiver Education Details Patient Name: Date of Service: [name], Cheyenne B. 2/24/2021andnbsp8:00 AM Medical Record [id number] Patient Account Number: [id number] Date of Birth/Gender: Treating RN: May 24, 1961 (59 y.o. [name] Standard Primary Care Physician: Docia Chuck, Dibas Other Clinician: Referring Physician: Treating Physician/Extender:Stone III, Lizabeth Leyden, Dibas Weeks in Treatment: 4 Education Assessment Education Provided To: Patient Education Topics Provided Venous: Methods: Explain/Verbal Responses: Reinforcements needed, State content correctly Wound/Skin Impairment: Methods: Explain/Verbal Responses: Reinforcements needed, State content correctly Electronic Signature(s) Signed: 10/28/2019 6:08:21 PM By: Zenaida Deed RN,  BSN Entered By: Zenaida Deed on 10/28/2019 09:15:14 -------------------------------------------------------------------------------- Wound Assessment Details Patient Name: Date of Service: [name] 10/28/2019 8:00 AM Medical Record [id number] Patient Account Number: [id number] Date of Birth/Sex: Treating RN: October 01, 1960 (59 y.o. [name] Standard Primary Care Farra Nikolic: Docia Chuck, Dibas Other Clinician: Referring Young Mulvey: Treating Cane Dubray/Extender:Stone III, Lizabeth Leyden, Dibas Weeks in Treatment: 4 Wound Status Wound Number: 2 Primary Venous Leg Ulcer Etiology: Wound Location: Left Lower Leg - Anterior Wound Status: Open Wounding Event: Trauma Comorbid Lymphedema, Raynauds, Date Acquired: 08/06/2019 Date Acquired: 08/06/2019 History: Neuropathy Weeks Of Treatment: 4 Clustered Wound: No Photos Wound Measurements Length: (cm) 5.2 % Reduct Width: (cm) 4 % Reduct Depth: (cm) 0.2 Epitheli Area: (cm) 16.336 Tunneli Volume: (cm) 3.267 Undermi Wound Description Full Thickness Without Exposed Support Foul Odo Classification: Structures Slough/F Wound Distinct, outline attached Margin: Exudate Medium Amount: Exudate Serous Type: Exudate amber Color: Wound Bed Granulation Amount: Medium (34-66%) Granulation Quality: Pink Fascia E Necrotic Amount: Medium (34-66%) Fat Laye Necrotic Quality: Adherent Slough Tendon E Muscle E Joint Ex Bone Exp r After Cleansing: No ibrino Yes Exposed Structure xposed: No r (Subcutaneous Tissue) Exposed: Yes xposed: No xposed: No posed: No osed: No ion in Area: 10.4% ion in Volume: 10.4% alization: Small (1-33%) ng: No ning: No Treatment Notes Wound #2 (Left, Anterior Lower Leg) 1. Cleanse With Wound Cleanser  3. Primary Dressing Applied Collegen AG 4. Secondary Dressing Roll Gauze Notes normal saline, telfa Electronic Signature(s) Signed: 10/28/2019 5:09:01 PM By: Benjaman Kindler EMT/HBOT Signed:  10/28/2019 6:08:21 PM By: Zenaida Deed RN, BSN Entered By: Benjaman Kindler on 10/28/2019 14:45:44 -------------------------------------------------------------------------------- Wound Assessment Details Patient Name: Date of Service: [name] 10/28/2019 8:00 AM Medical Record [id number] Patient Account Number: [id number] Date of Birth/Sex: Treating RN: Jun 01, 1961 (59 y.o. [name] Standard Primary Care Lazariah Savard: Docia Chuck, Dibas Other Clinician: Referring Kathee Tumlin: Treating Anysha Frappier/Extender:Stone III, Lizabeth Leyden, Dibas Weeks in Treatment: 4 Wound Status Wound Number: 3 Primary Venous Leg Ulcer Etiology: Wound Location: Left Lower Leg - Lateral Wound Status: Open Wounding Event: Trauma Comorbid Lymphedema, Raynauds, Date Acquired: 08/06/2019 History: Neuropathy Weeks Of Treatment: 4 Clustered Wound: No Photos Wound Measurements Length: (cm) 0.6 % Reduct Width: (cm) 0.3 % Reduct Depth: (cm) 0.1 Epitheli Area: (cm) 0.141 Tunneli Volume: (cm) 0.014 Undermi Wound Description Classification: Full Thickness Without Exposed Support Foul Odo Structures Slough/F Wound Distinct, outline attached Margin: Exudate Small Amount: Exudate Serous Type: Exudate amber Color: Wound Bed Granulation Amount: Small (1-33%) Granulation Quality: Pink Fascia E Necrotic Amount: Large (67-100%) Fat Laye Necrotic Quality: Adherent Slough Tendon E Muscle E Joint Expose Bone Exposed r After Cleansing: No ibrino Yes Exposed Structure xposed: No r (Subcutaneous Tissue) Exposed: Yes xposed: No xposed: No d: No : No ion in Area: 55.1% ion in Volume: 54.8% alization: Small (1-33%) ng: No ning: No Treatment Notes Wound #3 (Left, Lateral Lower Leg) 1. Cleanse With Wound Cleanser 3. Primary Dressing Applied Collegen AG 4. Secondary Dressing Roll Gauze Notes normal saline, telfa Electronic Signature(s) Signed: 10/28/2019 5:09:01 PM By: Benjaman Kindler  EMT/HBOT Signed: 10/28/2019 6:08:21 PM By: Zenaida Deed RN, BSN Entered By: Benjaman Kindler on 10/28/2019 14:46:06 -------------------------------------------------------------------------------- Vitals Details Patient Name: Date of Service: Cheyenne Aran B. 10/28/2019 8:00 AM Medical Record [id number] Patient Account Number: [id number] Date of Birth/Sex: Treating RN: [birth year] (59 y.o. Wynelle Link Primary Care Camelia Stelzner: Docia Chuck, Dibas Other Clinician: Referring Chenoa Luddy: Treating Adryan Shin/Extender:Stone III, Lizabeth Leyden, Dibas Weeks in Treatment: 4 Vital Signs Time Taken: 08:22 Temperature (F): 97.7 Height (in): 66 Pulse (bpm): 78 Weight (lbs): 156 Respiratory Rate (breaths/min): 16 Body Mass Index (BMI): 25.2 Blood Pressure (mmHg): 132/93 Reference Range: 80 - 120 mg / dl Electronic Signature(s) Signed: 10/29/2019 8:56:01 AM By: Zandra Abts RN, BSN Entered By: Zandra Abts on 10/28/2019 08:22:31

## 2019-11-04 ENCOUNTER — Encounter (HOSPITAL_BASED_OUTPATIENT_CLINIC_OR_DEPARTMENT_OTHER): Payer: Worker's Compensation | Attending: Physician Assistant | Admitting: Physician Assistant

## 2019-11-04 ENCOUNTER — Other Ambulatory Visit: Payer: Self-pay

## 2019-11-04 DIAGNOSIS — M3501 Sicca syndrome with keratoconjunctivitis: Secondary | ICD-10-CM | POA: Diagnosis not present

## 2019-11-04 DIAGNOSIS — L97822 Non-pressure chronic ulcer of other part of left lower leg with fat layer exposed: Secondary | ICD-10-CM | POA: Insufficient documentation

## 2019-11-04 DIAGNOSIS — G609 Hereditary and idiopathic neuropathy, unspecified: Secondary | ICD-10-CM | POA: Diagnosis not present

## 2019-11-04 DIAGNOSIS — L97828 Non-pressure chronic ulcer of other part of left lower leg with other specified severity: Secondary | ICD-10-CM | POA: Diagnosis not present

## 2019-11-04 DIAGNOSIS — I872 Venous insufficiency (chronic) (peripheral): Secondary | ICD-10-CM | POA: Insufficient documentation

## 2019-11-04 DIAGNOSIS — I7300 Raynaud's syndrome without gangrene: Secondary | ICD-10-CM | POA: Insufficient documentation

## 2019-11-04 NOTE — Progress Notes (Signed)
[name], [name] ([id number]) Visit Report for 11/04/2019 Arrival Information Details Patient Name: Date of Service: [name], [name] 11/04/2019 8:00 AM Medical Record [id number] Patient Account Number: [id number] Date of Birth/Sex: Treating RN: 05-Oct-1960 (59 y.o. Freddy Finner Primary Care Sanora Cunanan: Docia Chuck, Dibas Other Clinician: Referring Gisselle Galvis: Treating Ola Raap/Extender:Stone III, Lizabeth Leyden, Dibas Weeks in Treatment: 5 Visit Information History Since Last Visit All ordered tests and consults were completed: No Patient Arrived: Ambulatory Added or deleted any medications: No Arrival Time: 08:03 Any new allergies or adverse reactions: No Accompanied By: self Had a fall or experienced change in No Transfer Assistance: None activities of daily living that may affect Patient Identification Verified: Yes risk of falls: Secondary Verification Process Yes Signs or symptoms of abuse/neglect since last No Completed: visito Patient Requires Transmission-Based No Hospitalized since last visit: No Precautions: Implantable device outside of the clinic excluding No Patient Has Alerts: Yes cellular tissue based products placed in the center since last visit: Has Dressing in Place as Prescribed: Yes Pain Present Now: Yes Electronic Signature(s) Signed: 11/04/2019 5:16:33 PM By: Yevonne Pax RN Entered By: Yevonne Pax on 11/04/2019 08:03:54 -------------------------------------------------------------------------------- Clinic Level of Care Assessment Details Patient Name: Date of Service: [name], [name] 11/04/2019 8:00 AM Medical Record [id number] Patient Account Number: [id number] Date of Birth/Sex: Treating RN: [birth year] (59 y.o. Wynelle Link Primary Care Bernardine Langworthy: Docia Chuck, Dibas Other Clinician: Referring King Pinzon: Treating Jaleen Grupp/Extender:Stone III, Lizabeth Leyden, Dibas Weeks in Treatment: 5 Clinic Level of Care Assessment Items TOOL 4 Quantity  Score X - Use when only an EandM is performed on FOLLOW-UP visit 1 0 ASSESSMENTS - Nursing Assessment / Reassessment X - Reassessment of Co-morbidities (includes updates in patient status) 1 10 X - Reassessment of Adherence to Treatment Plan 1 5 ASSESSMENTS - Wound and Skin Assessment / Reassessment X - Simple Wound Assessment / Reassessment - one wound 1 5 []  - Complex Wound Assessment / Reassessment - multiple wounds 0 []  - Dermatologic / Skin Assessment (not related to wound area) 0 ASSESSMENTS - Focused Assessment []  - Circumferential Edema Measurements - multi extremities 0 []  - Nutritional Assessment / Counseling / Intervention 0 X - Lower Extremity Assessment (monofilament, tuning fork, pulses) 1 5 []  - Peripheral Arterial Disease Assessment (using hand held doppler) 0 ASSESSMENTS - Ostomy and/or Continence Assessment and Care []  - Incontinence Assessment and Management 0 []  - Ostomy Care Assessment and Management (repouching, etc.) 0 PROCESS - Coordination of Care X - Simple Patient / Family Education for ongoing care 1 15 []  - Complex (extensive) Patient / Family Education for ongoing care 0 X - Staff obtains , Records, Test Results / Process Orders 1 10 []  - Staff telephones HHA, Nursing Homes / Clarify orders / etc 0 []  - Routine Transfer to another Facility (non-emergent condition) 0 []  - Routine Hospital Admission (non-emergent condition) 0 []  - New Admissions / / Ordering NPWT, Apligraf, etc. 0 []  - Emergency Hospital Admission (emergent condition) 0 X - Simple Discharge Coordination 1 10 []  - Complex (extensive) Discharge Coordination 0 PROCESS - Special Needs []  - Pediatric / Minor Patient Management 0 []  - Isolation Patient Management 0 []  - Hearing / Language / Visual special needs 0 []  - Assessment of Community assistance (transportation, D/C planning, etc.) 0 []  - Additional assistance / Altered mentation 0 []  - Support Surface(s)  Assessment (bed, cushion, seat, etc.) 0 INTERVENTIONS - Wound Cleansing / Measurement X - Simple Wound Cleansing - one wound 1 5 []  - Complex  Wound Cleansing - multiple wounds 0 X - Wound Imaging (photographs - any number of wounds) 1 5 []  - Wound Tracing (instead of photographs) 0 X - Simple Wound Measurement - one wound 1 5 []  - Complex Wound Measurement - multiple wounds 0 INTERVENTIONS - Wound Dressings X - Small Wound Dressing one or multiple wounds 1 10 []  - Medium Wound Dressing one or multiple wounds 0 []  - Large Wound Dressing one or multiple wounds 0 X - Application of Medications - topical 1 5 []  - Application of Medications - injection 0 INTERVENTIONS - Miscellaneous []  - External ear exam 0 []  - Specimen Collection (cultures, biopsies, blood, body fluids, etc.) 0 []  - Specimen(s) / Culture(s) sent or taken to Lab for analysis 0 []  - Patient Transfer (multiple staff / / Similar devices) 0 []  - Simple Staple / Suture removal (25 or less) 0 []  - Complex Staple / Suture removal (26 or more) 0 []  - Hypo / Hyperglycemic Management (close monitor of Blood Glucose) 0 []  - Ankle / Brachial Index (ABI) - do not check if billed separately 0 X - Vital Signs 1 5 Has the patient been seen at the hospital within the last three years: Yes Total Score: 95 Level Of Care: New/Established - Level 3 Electronic Signature(s) Signed: 11/04/2019 5:54:02 PM By: RN, BSN Entered By: on 11/04/2019 08:49:45 -------------------------------------------------------------------------------- Encounter Discharge Information Details Patient Name: Date of Service: B. 11/04/2019 8:00 AM Medical Record Patient Account Number: Date of Birth/Sex: Treating RN: 1960/12/20 (59 y.o. Primary Care Tiyona Desouza: , Dibas Other Clinician: Referring Bastien Strawser: Treating Lugene Beougher/Extender:Stone III, ,  Dibas Weeks in Treatment: 5 Encounter Discharge Information Items Discharge Condition: Stable Ambulatory Status: Ambulatory Discharge Destination: Home Transportation: Private Auto Accompanied By: self Schedule Follow-up Appointment: Yes Clinical Summary of Care: Patient Declined Electronic Signature(s) Signed: 11/04/2019 5:16:33 PM By: Zandra Abts RN Entered By: Zandra Abts on 11/04/2019 09:06:00 -------------------------------------------------------------------------------- Lower Extremity Assessment Details Patient Name: Date of Service: [name], [name] 11/04/2019 8:00 AM Medical Record [id number] Patient Account Number: [id number] Date of Birth/Sex: Treating RN: 12-02-1960 (59 y.o. Freddy Finner Primary Care Ordean Fouts: Docia Chuck, Dibas Other Clinician: Referring Daxen Lanum: Treating Schon Zeiders/Extender:Stone III, Lizabeth Leyden, Dibas Weeks in Treatment: 5 Edema Assessment Assessed: [Left: No] [Right: No] Edema: [Left: Ye] [Right: s] Calf Left: Right: Point of Measurement: 29 cm From Medial Instep 34.7 cm cm Ankle Left: Right: Point of Measurement: 8 cm From Medial Instep 20.5 cm cm Electronic Signature(s) Signed: 11/04/2019 5:16:33 PM By: Yevonne Pax RN Entered By: Yevonne Pax on 11/04/2019 08:05:30 -------------------------------------------------------------------------------- Multi-Disciplinary Care Plan Details Patient Name: Date of Service: Cheyenne Lemons B. 11/04/2019 8:00 AM Medical Record [id number] Patient Account Number: [id number] Date of Birth/Sex: Treating RN: 04/28/1961 (59 y.o. Freddy Finner Primary Care Bain Whichard: Docia Chuck, Dibas Other Clinician: Referring Kayne Yuhas: Treating Mildreth Reek/Extender:Stone III, Lizabeth Leyden, Dibas Weeks in Treatment: 5 Active Inactive Venous Leg Ulcer Nursing Diagnoses: Knowledge deficit related to disease process and management Potential for venous Insuffiency (use before diagnosis confirmed) Goals: Patient will  maintain optimal edema control Date Initiated: 09/30/2019 Target Resolution Date: 11/25/2019 Goal Status: Active Patient/caregiver will verbalize understanding of disease process and disease management Date Initiated: 09/30/2019 Target Resolution Date: 11/25/2019 Goal Status: Active Interventions: Assess peripheral edema status every visit. Compression as ordered Provide education on venous insufficiency Treatment Activities: Therapeutic compression applied : 09/30/2019 Notes: Wound/Skin Impairment Nursing Diagnoses: Impaired tissue integrity Knowledge deficit related to ulceration/compromised skin  integrity Goals: Patient/caregiver will verbalize understanding of skin care regimen Date Initiated: 09/30/2019 Target Resolution Date: 11/25/2019 Goal Status: Active Ulcer/skin breakdown will have a volume reduction of 30% by week 4 Date Initiated: 09/30/2019 Date Inactivated: 10/28/2019 Target Resolution Date: 10/28/2019 Unmet Reason: other Goal Status: Unmet comorbidities Interventions: Assess patient/caregiver ability to obtain necessary supplies Assess patient/caregiver ability to perform ulcer/skin care regimen upon admission and as needed Assess ulceration(s) every visit Provide education on ulcer and skin care Treatment Activities: Skin care regimen initiated : 09/30/2019 Topical wound management initiated : 09/30/2019 Notes: Electronic Signature(s) Signed: 11/04/2019 5:54:02 PM By: Levan Hurst RN, BSN Entered By: Levan Hurst on 11/04/2019 08:48:40 -------------------------------------------------------------------------------- Pain Assessment Details Patient Name: Date of Service: [name] 11/04/2019 8:00 AM Medical Record [id number] Patient Account Number: [id number] Date of Birth/Sex: Treating RN: 04-May-1961 (59 y.o. [name] Primary Care Ceferino Lang: [name], Dibas Other Clinician: Referring Reta Norgren: Treating Keshav Winegar/Extender:Stone III,  Sharen Counter, Dibas Weeks in Treatment: 5 Active Problems Location of Pain Severity and Description of Pain Patient Has Paino Yes Site Locations With Dressing Change: Yes Duration of the Pain. Constant / Intermittento Intermittent How Long Does it Lasto Hours: Minutes: 15 Rate the pain. Current Pain Level: 0 Worst Pain Level: 5 Least Pain Level: 0 Tolerable Pain Level: 5 Character of Pain Describe the Pain: Burning, Throbbing Pain Management and Medication Current Pain Management: Medication: Yes Cold Application: No Rest: Yes Massage: No Activity: No T.E.N.S.: No Heat Application: No Leg drop or elevation: No Is the Current Pain Management Adequate: Inadequate How does your wound impact your activities of daily livingo Sleep: Yes Bathing: No Appetite: No Relationship With Others: No Bladder Continence: No Emotions: No Bowel Continence: No Work: No Toileting: No Drive: No Dressing: No Hobbies: No Electronic Signature(s) Signed: 11/04/2019 5:16:33 PM By: Carlene Coria RN Entered By: Carlene Coria on 11/04/2019 08:05:04 -------------------------------------------------------------------------------- Patient/Caregiver Education Details Patient Name: Date of Service: [name], Cheyenne B. 3/3/2021andnbsp8:00 AM Medical Record [id number] Patient Account Number: [id number] Date of Birth/Gender: Treating RN: 1960/09/21 (59 y.o. [name] Primary Care Physician: [name], Dibas Other Clinician: Referring Physician: Treating Physician/Extender:Stone III, Sharen Counter, Dibas Weeks in Treatment: 5 Education Assessment Education Provided To: Patient Education Topics Provided Wound/Skin Impairment: Methods: Explain/Verbal Responses: State content correctly Electronic Signature(s) Signed: 11/04/2019 5:54:02 PM By: Levan Hurst RN, BSN Entered By: Levan Hurst on 11/04/2019  08:48:52 -------------------------------------------------------------------------------- Wound Assessment Details Patient Name: Date of Service: [name] 11/04/2019 8:00 AM Medical Record [id number] Patient Account Number: [id number] Date of Birth/Sex: Treating RN: [birth year] (59 y.o. [name] Primary Care Thaer Miyoshi: [name], Dibas Other Clinician: Referring Lanasia Porras: Treating Alma Mohiuddin/Extender:Stone III, Sharen Counter, Dibas Weeks in Treatment: 5 Wound Status Wound Number: 2 Primary Venous Leg Ulcer Etiology: Wound Location: Left, Anterior Lower Leg Wound Status: Open Wounding Event: Trauma Comorbid Lymphedema, Raynauds, Date Acquired: 08/06/2019 History: Neuropathy Weeks Of Treatment: 5 Clustered Wound: No Photos Wound Measurements Length: (cm) 5 % Reduct Width: (cm) 4 % Reduct Depth: (cm) 0.2 Epitheli Area: (cm) 15.708 Tunneli Volume: (cm) 3.142 Undermi Wound Description Classification: Full Thickness Without Exposed Support Foul Odo Structures Slough/F Wound Distinct, outline attached Margin: Exudate Medium Amount: Exudate Serous Type: Exudate amber Color: Wound Bed Granulation Amount: Medium (34-66%) Granulation Quality: Pink Fascia E Necrotic Amount: Medium (34-66%) Fat Laye Necrotic Quality: Adherent Slough Tendon E Muscle E Joint Ex Bone Exp r After Cleansing: No ibrino Yes Exposed Structure xposed: No r (Subcutaneous Tissue) Exposed: Yes xposed: No xposed: No posed: No osed: No ion  in Area: 13.9% ion in Volume: 13.8% alization: Small (1-33%) ng: No ning: No Treatment Notes Wound #2 (Left, Anterior Lower Leg) 1. Cleanse With Wound Cleanser 2. Periwound Care TCA Cream 3. Primary Dressing Applied Collegen AG Hydrogel or K-Y Jelly 4. Secondary Dressing Other secondary dressing (specify in notes) Notes telfa Electronic Signature(s) Signed: 11/04/2019 4:03:04 PM By: Benjaman Kindler EMT/HBOT Signed: 11/04/2019 5:54:02  PM By: Zandra Abts RN, BSN Entered By: Benjaman Kindler on 11/04/2019 14:28:18 -------------------------------------------------------------------------------- Wound Assessment Details Patient Name: Date of Service: Cheyenne Lemons 11/04/2019 8:00 AM Medical Record [id number] Patient Account Number: [id number] Date of Birth/Sex: Treating RN: 1960-12-24 (59 y.o. Freddy Finner Primary Care Dontea Corlew: Docia Chuck, Dibas Other Clinician: Referring Irelynn Schermerhorn: Treating Osamu Olguin/Extender:Stone III, Lizabeth Leyden, Dibas Weeks in Treatment: 5 Wound Status Wound Number: 3 Primary Venous Leg Ulcer Etiology: Wound Location: Left Lower Leg - Lateral Wound Status: Open Wounding Event: Trauma Comorbid Lymphedema, Raynauds, Date Acquired: 08/06/2019 History: Neuropathy Weeks Of Treatment: 5 Clustered Wound: No Photos Wound Measurements Length: (cm) 0.7 % Reduct Width: (cm) 0.4 % Reduct Depth: (cm) 0.1 Epitheli Area: (cm) 0.22 Tunneli Volume: (cm) 0.022 Undermi Wound Description Classification: Full Thickness Without Exposed Support Foul Odo Structures Slough/F Wound Distinct, outline attached Margin: Exudate Exudate Small Amount: Exudate Serous Type: Exudate amber Color: Wound Bed Granulation Amount: Small (1-33%) Granulation Quality: Pink Fascia Exp Necrotic Amount: Large (67-100%) Fat Layer Necrotic Quality: Adherent Slough Tendon Exp Muscle Exp Joint Expo Bone Expos r After Cleansing: No ibrino Yes Exposed Structure osed: No (Subcutaneous Tissue) Exposed: Yes osed: No osed: No sed: No ed: No ion in Area: 29.9% ion in Volume: 29% alization: Small (1-33%) ng: No ning: No Treatment Notes Wound #3 (Left, Lateral Lower Leg) 1. Cleanse With Wound Cleanser 2. Periwound Care TCA Cream 3. Primary Dressing Applied Collegen AG Hydrogel or K-Y Jelly 4. Secondary Dressing Other secondary dressing (specify in notes) Notes telfa Electronic Signature(s) Signed:  11/04/2019 4:03:04 PM By: Benjaman Kindler EMT/HBOT Signed: 11/04/2019 5:16:33 PM By: Yevonne Pax RN Entered By: Benjaman Kindler on 11/04/2019 14:27:47 -------------------------------------------------------------------------------- Vitals Details Patient Name: Date of Service: Cheyenne Aran B. 11/04/2019 8:00 AM Medical Record [id number] Patient Account Number: [id number] Date of Birth/Sex: Treating RN: 17-Sep-1960 (59 y.o. Freddy Finner Primary Care Braedyn Riggle: Docia Chuck, Dibas Other Clinician: Referring Rosellen Lichtenberger: Treating Riane Rung/Extender:Stone III, Lizabeth Leyden, Dibas Weeks in Treatment: 5 Vital Signs Time Taken: 08:03 Temperature (F): 97.7 Height (in): 66 Pulse (bpm): 71 Weight (lbs): 156 Respiratory Rate (breaths/min): 18 Body Mass Index (BMI): 25.2 Blood Pressure (mmHg): 121/83 Reference Range: 80 - 120 mg / dl Electronic Signature(s) Signed: 11/04/2019 5:16:33 PM By: Yevonne Pax RN Entered By: Yevonne Pax on 11/04/2019 08:04:17

## 2019-11-04 NOTE — Progress Notes (Addendum)
[name], [name] ([id number]) Visit Report for 11/04/2019 Chief Complaint Document Details Patient Name: Date of Service: [name], [name] 11/04/2019 8:00 AM Medical Record [id number] Patient Account Number: [id number] Date of Birth/Sex: Treating RN: 1960-12-06 (59 y.o. Wynelle Link Primary Care Provider: Docia Chuck, Dib[place] Other Clinician: Referring Provider: Treating Provider/Extender:Stone III, Lizabeth Leyden, Dibas Weeks in Treatment: 5 Information Obtained from: Patient Chief Complaint Left LE Ulcers Electronic Signature(s) Signed: 11/04/2019 8:25:10 AM By: Lenda Kelp PA-C Entered By: Lenda Kelp on 11/04/2019 08:25:10 -------------------------------------------------------------------------------- HPI Details Patient Name: Date of Service: Cheyenne Aran B. 11/04/2019 8:00 AM Medical Record [id number] Patient Account Number: [id number] Date of Birth/Sex: Treating RN: 1961-06-12 (59 y.o. Wynelle Link Primary Care Provider: Docia Chuck, Dib[place] Other Clinician: Referring Provider: Treating Provider/Extender:Stone III, Lizabeth Leyden, Dibas Weeks in Treatment: 5 History of Present Illness HPI Description: 09/30/2019 upon evaluation today patient presents for initial inspection here in our clinic concerning issues that she has been experiencing with a wound on her left anterior lower leg. She unfortunately had a significant laceration which required 19 sutures and she shows me the pictures both before as well as after sutured in a very good job was done in this regard. Unfortunately the skin did not take and necrosis. Subsequently this wound open and she has been having more significant issues with that since. Fortunately there is no evidence of active infection at this time which is good news. No fever chills noted. She has a history of chronic venous stasis but is no longer able to wear any compression in fact she cannot even wear regular socks due to her  neuropathy and the pain that she experiences in her feet if she does. For that reason she tries to elevate her legs as much as she can at nighttime but again she really cannot wear any type of compression. With regard to antibiotic it does not appear that she has been on anything recently. 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 Fortunately there is no evidence of local or systemic infection. No fevers, chills, nausea, vomiting, or diarrhea. 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 10/07/2019 on evaluation today patient presents for follow-up concerning her wound on the left lower extremity. She has been tolerating the dressing changes without complication. With that being said she just got the Santyl 2 days ago. Obviously it has not had enough time to really do what we need to do she would like to obviously give this some time before proceeding with more aggressive debridement or otherwise. 10/21/2019 upon evaluation today patient appears to be doing better with regard to the overall appearance of her wound is not quite as dry I do believe the Santyl has helped her improve to some degree here. Fortunately there is no signs of active infection at this time. I think we may be able to clean off the wound with a little bit of additional debridement today and likely we can transition away from the Santyl to some kind of collagen type dressing. 10/28/2019 upon evaluation today patient actually appears to be doing fairly well with regard to her wound today. She has been tolerating the dressing changes we did switch to collagen and  fortunately that seems to be doing well for her at this point. There does not seem to be any signs of active infection I do feel like the  surface of the wound is looking better at this time as well. 11/04/2019 upon evaluation today patient appears to be doing well with regard to her wound. She is making some progress here and the wound does appear to be measuring smaller which is good news. Overall this is good to be somewhat slow but nonetheless I am encouraged by the fact that the collagen does seem to have helped her. Electronic Signature(s) Signed: 11/04/2019 8:54:31 AM By: Lenda Kelp PA-C Entered By: Lenda Kelp on 11/04/2019 08:54:31 -------------------------------------------------------------------------------- Physical Exam Details Patient Name: Date of Service: [name], [name] 11/04/2019 8:00 AM Medical Record [id number] Patient Account Number: [id number] Date of Birth/Sex: Treating RN: [birth year] (59 y.o. Wynelle Link Primary Care Provider: Docia Chuck, Dibas Other Clinician: Referring Provider: Treating Provider/Extender:Stone III, Lizabeth Leyden, Dibas Weeks in Treatment: 5 Constitutional Well-nourished and well-hydrated in no acute distress. Respiratory normal breathing without difficulty. Psychiatric this patient is able to make decisions and demonstrates good insight into disease process. Alert and Oriented x 3. pleasant and cooperative. Notes His wound bed currently showed signs of good granulation at this time. Fortunately there is no significant slough buildup which is also good news that she has been tolerating the dressing changes without complication. No fevers, chills, nausea, vomiting, or diarrhea. No sharp debridement was necessary today. Electronic Signature(s) Signed: 11/04/2019 8:54:58 AM By: Lenda Kelp PA-C Entered By: Lenda Kelp on 11/04/2019 08:54:58 -------------------------------------------------------------------------------- Physician Orders Details Patient Name: Date of Service: [name], [name] 11/04/2019 8:00 AM Medical Record [id number] Patient  Account Number: [id number] Date of Birth/Sex: Treating RN: [birth year] (59 y.o. 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 Dressing Change Frequency Wound #2 Left,Anterior Lower Leg Change Dressing every other day. Wound #3 Left,Lateral Lower Leg Change Dressing every other day. Skin Barriers/Peri-Wound Care TCA Cream or Ointment - to periwound in clinic, use Mometasone at home Wound Cleansing Wound #2 Left,Anterior Lower Leg May shower and wash wound with soap and water. - scrub gently with gauze Wound #3 Left,Lateral Lower Leg May shower and wash wound with soap and water. - scrub gently with gauze Primary Wound Dressing Wound #2 Left,Anterior Lower Leg Silver Collagen - moisten with hydrogel or KY gel Wound #3 Left,Lateral Lower Leg Silver Collagen - moisten with hydrogel or KY gel Secondary Dressing Wound #2 Left,Anterior Lower Leg Kerlix/Rolled Gauze Telfa (non adherent pad) Wound #3 Left,Lateral Lower Leg Kerlix/Rolled Gauze Telfa (non adherent pad) Edema Control Avoid standing for long periods of time Elevate legs to the level of the heart or above for 30 minutes daily and/or when sitting, a frequency of: - throughout the day Exercise regularly Electronic Signature(s) Signed: 11/04/2019 4:54:37 PM By: Lenda Kelp PA-C Signed:  11/04/2019 5:54:02 PM By: Zandra Abts RN, BSN Entered By: Zandra Abts on 11/04/2019 08:52:25 -------------------------------------------------------------------------------- Problem List Details Patient Name: Date  of Service: [name], [name] 11/04/2019 8:00 AM Medical Record [id number] Patient Account Number: [id number] Date of Birth/Sex: Treating RN: [birth year] (59 y.o. 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 11/04/2019 8:00 AM Medical Record [id number] Patient Account Number: [id number] Date of Birth/Sex: Treating RN: [birth year] (59 y.o. Wynelle Link Primary Care Provider: Docia Chuck, Dibas Other Clinician: Referring Provider: Treating Provider/Extender:Stone III, Lizabeth Leyden, Dibas Weeks in Treatment: 5 Subjective Chief Complaint Information obtained from Patient Left LE Ulcers History of Present Illness (HPI) 09/30/2019 upon evaluation today  patient presents for initial inspection here in our clinic concerning issues that she has been experiencing with a wound on her left anterior lower leg. She unfortunately had a significant laceration which required 19 sutures and she shows me the pictures both before as well as after sutured in a very good job was done in this regard. Unfortunately the skin did not take and necrosis. Subsequently this wound open and she has been having more significant issues with that since. Fortunately there is no evidence of active infection at this time which is good news. No fever chills noted. She has a history of chronic venous stasis but is no longer able to wear any compression in fact she cannot even wear regular socks due to her neuropathy and the pain that she experiences in her feet if she does. For that reason she tries to elevate her legs as much as she can at nighttime but again she really cannot wear any type of compression. With regard to antibiotic it does not appear that she has been on anything recently. 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 Fortunately there is no evidence of local or systemic infection. No fevers, chills, nausea, vomiting, or diarrhea. 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 10/07/2019 on evaluation today patient presents for follow-up concerning her wound on the left lower extremity. She has been tolerating the dressing changes without complication. With that being said she just got the Santyl 2 days ago. Obviously it has not had enough time to really do what we need to do she would like to obviously give this some time before proceeding with more aggressive debridement or otherwise. 10/21/2019 upon evaluation today patient appears  to be doing better with regard to the overall appearance of her wound is not quite as dry I do believe the Santyl has helped her improve to some degree here. Fortunately there is no signs of  active infection at this time. I think we may be able to clean off the wound with a little bit of additional debridement today and likely we can transition away from the Santyl to some kind of collagen type dressing. 10/28/2019 upon evaluation today patient actually appears to be doing fairly well with regard to her wound today. She has been tolerating the dressing changes we did switch to collagen and fortunately that seems to be doing well for her at this point. There does not seem to be any signs of active infection I do feel like the surface of the wound is looking better at this time as well. 11/04/2019 upon evaluation today patient appears to be doing well with regard to her wound. She is making some progress here and the wound does appear to be measuring smaller which is good news. Overall this is good to be somewhat slow but nonetheless I am encouraged by the fact that the collagen does seem to have helped her. Objective Constitutional Well-nourished and well-hydrated in no acute distress. Vitals Time Taken: 8:03 AM, Height: 66 in, Weight: 156 lbs, BMI: 25.2, Temperature: 97.7 F, Pulse: 71 bpm, Respiratory Rate: 18 breaths/min, Blood Pressure: 121/83 mmHg. Respiratory normal breathing without difficulty. Psychiatric this patient is able to make decisions and demonstrates good insight into disease process. Alert and Oriented x 3. pleasant and cooperative. General Notes: His wound bed currently showed signs of good granulation at this time. Fortunately there is no significant slough buildup which is also good news that she has been tolerating the dressing changes without complication. No fevers, chills, nausea, vomiting, or diarrhea. No sharp debridement was necessary today. Integumentary (Hair,  Skin) Wound #2 status is Open. Original cause of wound was Trauma. The wound is located on the Left,Anterior Lower Leg. The wound measures 5cm length x 4cm width x 0.2cm depth; 15.708cm^2 area and 3.142cm^3 volume. There is Fat Layer (Subcutaneous Tissue) Exposed exposed. There is no tunneling or undermining noted. There is a medium amount of serous drainage noted. The wound margin is distinct with the outline attached to the wound base. There is medium (34-66%) pink granulation within the wound bed. There is a medium (34-66%) amount of necrotic tissue within the wound bed including Adherent Slough. Wound #3 status is Open. Original cause of wound was Trauma. The wound is located on the Left,Lateral Lower Leg. The wound measures 0.7cm length x 0.4cm width x 0.1cm depth; 0.22cm^2 area and 0.022cm^3 volume. There is Fat Layer (Subcutaneous Tissue) Exposed exposed. There is no tunneling or undermining noted. There is a small amount of serous drainage noted. The wound margin is distinct with the outline attached to the wound base. There is small (1-33%) pink granulation within the wound bed. There is a large (67-100%) amount of necrotic tissue within the wound bed including Adherent Slough. 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 Dressing Change Frequency: Wound #2 Left,Anterior Lower Leg: Change Dressing every other day. Wound #3 Left,Lateral Lower Leg: Change Dressing every other day. Skin Barriers/Peri-Wound Care: TCA Cream or Ointment - to periwound in clinic, use Mometasone at home Wound  Cleansing: Wound #2 Left,Anterior Lower Leg: May shower and wash wound with soap and water. - scrub  gently with gauze Wound #3 Left,Lateral Lower Leg: May shower and wash wound with soap and water. - scrub gently with gauze Primary Wound Dressing: Wound #2 Left,Anterior Lower Leg: Silver Collagen - moisten with hydrogel or KY gel Wound #3 Left,Lateral Lower Leg: Silver Collagen - moisten with hydrogel or KY gel Secondary Dressing: Wound #2 Left,Anterior Lower Leg: Kerlix/Rolled Gauze Telfa (non adherent pad) Wound #3 Left,Lateral Lower Leg: Kerlix/Rolled Gauze Telfa (non adherent pad) Edema Control: Avoid standing for long periods of time Elevate legs to the level of the heart or above for 30 minutes daily and/or when sitting, a frequency of: - throughout the day Exercise regularly 1 I would recommend currently that we go ahead and initiate treatment with a continuation of the silver collagen dressing the patient is in agreement with the plan. 2. I am also going to suggest that we continue with using the [place] followed by roll gauze in order to secure this in place. I think that is doing a good job at this point. She is using some steroid cream around the edges of the wound prescribed by her primary care provider I think that is fine for her to continue. 3. I am also going to suggest she continue to elevate her legs is much as possible when she is at home I think this will also be of benefit for her. We will see patient back for reevaluation in 1 week here in the clinic. If anything worsens or changes patient will contact our office for additional recommendations. Electronic Signature(s) Signed: 11/04/2019 8:55:45 AM By: Worthy Keeler PA-C Entered By: Worthy Keeler on 11/04/2019 08:55:45 -------------------------------------------------------------------------------- SuperBill Details Patient Name: Date of Service: [name] 11/04/2019 Medical Record  [id number] Patient Account Number: [id number] Date of Birth/Sex: Treating RN: 1961-08-31 (59 y.o. 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, unspecified Facility Procedures CPT4 Code: [id number] Description: 99213 - WOUND CARE VISIT-LEV 3 EST PT Modifier: Quantity: 1 Physician Procedures CPT4 Code Description: [id number] 51761 - WC PHYS LEVEL 3 - EST PT ICD-10 Diagnosis Description I87.2 Venous insufficiency (chronic) (peripheral) L97.822 Non-pressure chronic ulcer of other part of left lower le L97.828 Non-pressure chronic ulcer of other  part of left lower le severity 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

## 2019-11-11 ENCOUNTER — Other Ambulatory Visit: Payer: Self-pay

## 2019-11-11 ENCOUNTER — Encounter (HOSPITAL_BASED_OUTPATIENT_CLINIC_OR_DEPARTMENT_OTHER): Payer: Worker's Compensation | Admitting: Physician Assistant

## 2019-11-11 DIAGNOSIS — I872 Venous insufficiency (chronic) (peripheral): Secondary | ICD-10-CM | POA: Diagnosis not present

## 2019-11-11 NOTE — Progress Notes (Addendum)
KOREA, SEVERS ([id number]) Visit Report for 11/11/2019 Arrival Information Details Patient Name: Date of Service: [name], [name] 11/11/2019 8:00 AM Medical Record [id number] Patient Account Number: [id number] Date of Birth/Sex: Treating RN: 10-02-1960 (59 y.o. [name] Primary Care Johnathon Mittal: [name], Dibas Other Clinician: Referring Nafisa Olds: Treating Sten Dematteo/Extender:Stone III, Sharen Counter, Dibas Weeks in Treatment: 6 Visit Information History Since Last Visit All ordered tests and consults were completed: No Patient Arrived: Ambulatory Added or deleted any medications: No Arrival Time: 08:12 Any new allergies or adverse reactions: No Accompanied By: self Had a fall or experienced change in No Transfer Assistance: None activities of daily living that may affect Patient Identification Verified: Yes risk of falls: Secondary Verification Process Yes Signs or symptoms of abuse/neglect since last No Completed: visito Patient Requires Transmission-Based No Hospitalized since last visit: No Precautions: Implantable device outside of the clinic excluding No Patient Has Alerts: Yes cellular tissue based products placed in the center since last visit: Has Dressing in Place as Prescribed: Yes Pain Present Now: No Electronic Signature(s) Signed: 11/11/2019 5:35:23 PM By: Carlene Coria RN Entered By: Carlene Coria on 11/11/2019 08:12:56 -------------------------------------------------------------------------------- Encounter Discharge Information Details Patient Name: Date of Service: Cheyenne Mccallum B. 11/11/2019 8:00 AM Medical Record [id number] Patient Account Number: [id number] Date of Birth/Sex: Treating RN: Jan 11, 1961 (59 y.o. [name] Primary Care Ariston Grandison: [name], Dibas Other Clinician: Referring Jawad Wiacek: Treating Kateland [name]/Extender:Stone III, Sharen Counter, Dibas Weeks in Treatment: 6 Encounter Discharge Information Items Post Procedure  Vitals Discharge Condition: Stable Temperature (F): 97.9 Ambulatory Status: Ambulatory Pulse (bpm): 81 Discharge Destination: Home Respiratory Rate (breaths/min): 18 Transportation: Private Auto Blood Pressure (mmHg): 126/77 Accompanied By: self Schedule Follow-up Appointment: Yes Clinical Summary of Care: Electronic Signature(s) Signed: 11/11/2019 5:34:34 PM By: Deon Pilling Entered By: Deon Pilling on 11/11/2019 09:09:17 -------------------------------------------------------------------------------- Lower Extremity Assessment Details Patient Name: Date of Service: [name], [name] 11/11/2019 8:00 AM Medical Record [id number] Patient Account Number: [id number] Date of Birth/Sex: Treating RN: October 13, 1960 (59 y.o. [name] Primary Care Diane Mochizuki: [name], Dibas Other Clinician: Referring Kendahl Bumgardner: Treating Fatemah Pourciau/Extender:Stone III, Sharen Counter, Dibas Weeks in Treatment: 6 Edema Assessment Assessed: [Left: No] [Right: No] Edema: [Left: Ye] [Right: s] Calf Left: Right: Point of Measurement: 29 cm From Medial Instep 34 cm cm Ankle Left: Right: Point of Measurement: 8 cm From Medial Instep 20 cm cm Electronic Signature(s) Signed: 11/11/2019 5:35:23 PM By: Carlene Coria RN Entered By: Carlene Coria on 11/11/2019 08:19:15 -------------------------------------------------------------------------------- Multi-Disciplinary Care Plan Details Patient Name: Date of Service: Cheyenne Mccallum B. 11/11/2019 8:00 AM Medical Record [id number] Patient Account Number: [id number] Date of Birth/Sex: Treating RN: 1961-02-11 (59 y.o. [name] Primary Care Lanasia Porras: [name], Dibas Other Clinician: Referring Maelyn Berrey: Treating Mihaela Fajardo/Extender:Stone III, Sharen Counter, Dibas Weeks in Treatment: 6 Active Inactive Venous Leg Ulcer Nursing Diagnoses: Knowledge deficit related to disease process and management Potential for venous Insuffiency (use before diagnosis  confirmed) Goals: Patient will maintain optimal edema control Date Initiated: 09/30/2019 Target Resolution Date: 11/25/2019 Goal Status: Active Patient/caregiver will verbalize understanding of disease process and disease management Date Initiated: 09/30/2019 Target Resolution Date: 11/25/2019 Goal Status: Active Interventions: Assess peripheral edema status every visit. Compression as ordered Provide education on venous insufficiency Treatment Activities: Therapeutic compression applied : 09/30/2019 Notes: Wound/Skin Impairment Nursing Diagnoses: Impaired tissue integrity Knowledge deficit related to ulceration/compromised skin integrity Goals: Patient/caregiver will verbalize understanding of skin care regimen Date Initiated: 09/30/2019 Target Resolution Date: 11/25/2019 Goal Status: Active Ulcer/skin breakdown will have a volume reduction of 30% by week  4 Date Initiated: 09/30/2019 Date Inactivated: 10/28/2019 Target Resolution Date: 10/28/2019 Unmet Reason: other Goal Status: Unmet comorbidities Interventions: Assess patient/caregiver ability to obtain necessary supplies Assess patient/caregiver ability to perform ulcer/skin care regimen upon admission and as needed Assess ulceration(s) every visit Provide education on ulcer and skin care Treatment Activities: Skin care regimen initiated : 09/30/2019 Topical wound management initiated : 09/30/2019 Notes: Electronic Signature(s) Signed: 11/11/2019 5:43:44 PM By: Zenaida Deed RN, BSN Entered By: Zenaida Deed on 11/11/2019 08:50:56 -------------------------------------------------------------------------------- Pain Assessment Details Patient Name: Date of Service: [name] 11/11/2019 8:00 AM Medical Record [id number] Patient Account Number: [id number] Date of Birth/Sex: Treating RN: 1961-04-28 (59 y.o. [name] Primary Care Parlee Amescua: Docia Chuck, Dibas Other Clinician: Referring Jinger Middlesworth: Treating  Elijio Staples/Extender:Stone III, Lizabeth Leyden, Dibas Weeks in Treatment: 6 Active Problems Location of Pain Severity and Description of Pain Patient Has Paino No Site Locations Pain Management and Medication Current Pain Management: Electronic Signature(s) Signed: 11/11/2019 5:35:23 PM By: Yevonne Pax RN Entered By: Yevonne Pax on 11/11/2019 08:13:36 -------------------------------------------------------------------------------- Patient/Caregiver Education Details Patient Name: Date of Service: [name], Cheyenne B. 3/10/2021andnbsp8:00 AM Medical Record [phone] Patient Account Number: [id number] Date of Birth/Gender: Treating RN: [birth year] (59 y.o. [name] Primary Care Physician: Docia Chuck, Dibas Other Clinician: Referring Physician: Treating Physician/Extender:Stone III, Lizabeth Leyden, Dibas Weeks in Treatment: 6 Education Assessment Education Provided To: Patient Education Topics Provided Venous: Methods: Explain/Verbal Responses: Reinforcements needed, State content correctly Wound/Skin Impairment: Methods: Explain/Verbal Responses: Reinforcements needed, State content correctly Electronic Signature(s) Signed: 11/11/2019 5:43:44 PM By: Zenaida Deed RN, BSN Entered By: Zenaida Deed on 11/11/2019 08:51:14 -------------------------------------------------------------------------------- Wound Assessment Details Patient Name: Date of Service: [name] 11/11/2019 8:00 AM Medical Record [id number] Patient Account Number: [id number] Date of Birth/Sex: Treating RN: [birth year] (59 y.o. [name] Primary Care [name]: Docia Chuck, Dibas Other Clinician: Referring Chantella Creech: Treating [name]/Extender:Stone III, Lizabeth Leyden, Dibas Weeks in Treatment: 6 Wound Status Wound Number: 2 Primary Venous Leg Ulcer Etiology: Wound Location: Left Lower Leg - Anterior Wound Status: Open Wounding Event: Trauma Comorbid Lymphedema, Raynauds, Date  Acquired: 08/06/2019 History: Neuropathy Weeks Of Treatment: 6 Clustered Wound: No Photos Wound Measurements Length: (cm) 4.2 % Reduction Width: (cm) 3.7 % Reduction Depth: (cm) 0.3 Epitheliali Area: (cm) 12.205 Tunneling: Volume: (cm) 3.662 Underminin Wound Description Full Thickness Without Exposed Support Foul Od Classification: Structures Slough/ Wound Distinct, outline attached Margin: Exudate Medium Amount: Exudate Serosanguineous Type: Exudate red, brown Color: Wound Bed Granulation Amount: Medium (34-66%) Granulation Quality: Pink Fascia E Necrotic Amount: Medium (34-66%) Fat Laye Necrotic Quality: Adherent Slough Tendon E Muscle E Joint Ex Bone Exp or After Cleansing: No Fibrino Yes Exposed Structure xposed: No r (Subcutaneous Tissue) Exposed: Yes xposed: No xposed: No posed: No osed: No in Area: 33.1% in Volume: -0.4% zation: Small (1-33%) No g: No Treatment Notes Wound #2 (Left, Anterior Lower Leg) 1. Cleanse With Wound Cleanser 3. Primary Dressing Applied Collegen AG Hydrogel or K-Y Jelly 4. Secondary Dressing Other secondary dressing (specify in notes) Notes secondary telfa pad. Electronic Signature(s) Signed: 11/17/2019 4:45:30 PM By: Benjaman Kindler EMT/HBOT Signed: 11/17/2019 5:21:02 PM By: Zenaida Deed RN, BSN Previous Signature: 11/11/2019 5:35:23 PM Version By: Yevonne Pax RN Entered By: Benjaman Kindler on 11/17/2019 15:46:21 -------------------------------------------------------------------------------- Wound Assessment Details Patient Name: Date of Service: [name] 11/11/2019 8:00 AM Medical Record [id number] Patient Account Number: [id number] Date of Birth/Sex: Treating RN: 1960/11/29 (59 y.o. [name] Primary Care Jaren Vanetten: Docia Chuck, Dibas Other Clinician: Referring Mikenna Bunkley: Treating Dejon Lukas/Extender:Stone III, Lizabeth Leyden, Dibas  Weeks in Treatment: 6 Wound Status Wound Number: 3 Primary  Venous Leg Ulcer Etiology: Wound Location: Left, Lateral Lower Leg Wound Status: Open Wounding Event: Trauma Comorbid Lymphedema, Raynauds, Date Acquired: 08/06/2019 History: Neuropathy Weeks Of Treatment: 6 Clustered Wound: No Photos Wound Measurements Length: (cm) 0.5 % Reduct Width: (cm) 0.2 % Reduct Depth: (cm) 0.1 Epitheli Area: (cm) 0.079 Tunneli Volume: (cm) 0.008 Undermi Wound Description Classification: Full Thickness Without Exposed Support Foul Od Structures Slough/ Wound Distinct, outline attached Margin: Exudate Small Amount: Exudate Serosanguineous Type: Exudate red, brown Color: Wound Bed Granulation Amount: Small (1-33%) Granulation Quality: Pink Fascia E Necrotic Amount: Large (67-100%) Fat Laye Necrotic Quality: Adherent Slough Tendon E Muscle E Joint Ex Bone Exp or After Cleansing: No Fibrino Yes Exposed Structure xposed: No r (Subcutaneous Tissue) Exposed: Yes xposed: No xposed: No posed: No osed: No ion in Area: 74.8% ion in Volume: 74.2% alization: Small (1-33%) ng: No ning: No Treatment Notes Wound #3 (Left, Lateral Lower Leg) 1. Cleanse With Wound Cleanser 3. Primary Dressing Applied Collegen AG Hydrogel or K-Y Jelly 4. Secondary Dressing Other secondary dressing (specify in notes) Notes secondary telfa pad. Electronic Signature(s) Signed: 11/17/2019 4:45:30 PM By: Benjaman Kindler EMT/HBOT Signed: 11/17/2019 5:21:02 PM By: Zenaida Deed RN, BSN Previous Signature: 11/11/2019 5:35:23 PM Version By: Yevonne Pax RN Entered By: Benjaman Kindler on 11/17/2019 15:45:52 -------------------------------------------------------------------------------- Vitals Details Patient Name: Date of Service: Cheyenne Aran B. 11/11/2019 8:00 AM Medical Record [id number] Patient Account Number: [id number] Date of Birth/Sex: Treating RN: October 15, 1960 (59 y.o. [name] Primary Care Azaryah Oleksy: Docia Chuck, Dibas Other Clinician: Referring  Wash Nienhaus: Treating Amiya Escamilla/Extender:Stone III, Lizabeth Leyden, Dibas Weeks in Treatment: 6 Vital Signs Time Taken: 08:13 Temperature (F): 97.9 Height (in): 66 Pulse (bpm): 81 Weight (lbs): 156 Respiratory Rate (breaths/min): 18 Body Mass Index (BMI): 25.2 Blood Pressure (mmHg): 126/77 Reference Range: 80 - 120 mg / dl Electronic Signature(s) Signed: 11/11/2019 5:35:23 PM By: Yevonne Pax RN Entered By: Yevonne Pax on 11/11/2019 08:13:29

## 2019-11-12 NOTE — Progress Notes (Addendum)
[name], [name] ([id number]) Visit Report for 11/11/2019 Chief Complaint Document Details Patient Name: Date of Service: [name], [name] 11/11/2019 8:00 AM Medical Record [id number] Patient Account Number: [id number] Date of Birth/Sex: Treating RN: 16-Apr-1961 (59 y.o. [name] Standard Primary Care Provider: Docia Chuck, Dibas Other Clinician: Referring Provider: Treating Provider/Extender:Stone III, Lizabeth Leyden, Dibas Weeks in Treatment: 6 Information Obtained from: Patient Chief Complaint Left LE Ulcers Electronic Signature(s) Signed: 11/11/2019 8:48:37 AM By: Lenda Kelp PA-C Entered By: Lenda Kelp on 11/11/2019 08:48:36 -------------------------------------------------------------------------------- Debridement Details Patient Name: Date of Service: Cheyenne Aran B. 11/11/2019 8:00 AM Medical Record [id number] Patient Account Number: [id number] Date of Birth/Sex: Treating RN: 1960-11-27 (59 y.o. [name] Standard Primary Care Provider: Docia Chuck, Dibas Other Clinician: Referring Provider: Treating Provider/Extender:Stone III, Lizabeth Leyden, Dibas Weeks in Treatment: 6 Debridement Performed for Wound #2 Left,Anterior Lower Leg Assessment: Performed By: Physician Lenda Kelp, PA Debridement Type: Debridement Severity of Tissue Pre Fat layer exposed Debridement: Level of Consciousness (Pre- Awake and Alert procedure): Pre-procedure Verification/Time Out Taken: Yes - 08:50 Start Time: 08:51 Pain Control: Other : benzocaine 20% spray Total Area Debrided (L x W): 4.2 (cm) x 3.7 (cm) = 15.54 (cm) Tissue and other material Viable, Non-Viable, Slough, Subcutaneous, Slough debrided: Level: Skin/Subcutaneous Tissue Debridement Description: Excisional Instrument: Curette Bleeding: Minimum Hemostasis Achieved: Pressure End Time: 08:55 Procedural Pain: 3 Post Procedural Pain: 1 Response to Treatment: Procedure was tolerated well Level of  Consciousness Awake and Alert (Post-procedure): Post Debridement Measurements of Total Wound Length: (cm) 4.2 Width: (cm) 3.7 Depth: (cm) 0.3 Volume: (cm) 3.662 Character of Wound/Ulcer Post Improved Debridement: Severity of Tissue Post Debridement: Fat layer exposed Post Procedure Diagnosis Same as Pre-procedure Electronic Signature(s) Signed: 11/11/2019 5:29:37 PM By: Lenda Kelp PA-C Signed: 11/11/2019 5:43:44 PM By: Zenaida Deed RN, BSN Entered By: Zenaida Deed on 11/11/2019 08:54:40 -------------------------------------------------------------------------------- HPI Details Patient Name: Date of Service: Cheyenne Aran B. 11/11/2019 8:00 AM Medical Record [id number] Patient Account Number: [id number] Date of Birth/Sex: Treating RN: September 14, 1960 (59 y.o. [name] Standard Primary Care Provider: Docia Chuck, Dibas Other Clinician: Referring Provider: Treating Provider/Extender:Stone III, Lizabeth Leyden, Dibas Weeks in Treatment: 6 History of Present Illness HPI Description: 09/30/2019 upon evaluation today patient presents for initial inspection here in our clinic concerning issues that she has been experiencing with a wound on her left anterior lower leg. She unfortunately had a significant laceration which required 19 sutures and she shows me the pictures both before as well as after sutured in a very good job was done in this regard. Unfortunately the skin did not take and necrosis. Subsequently this wound open and she has been having more significant issues with that since. Fortunately there is no evidence of active infection at this time which is good news. No fever chills noted. She has a history of chronic venous stasis but is no longer able to wear any compression in fact she cannot even wear regular socks due to her neuropathy and the pain that she experiences in her feet if she does. For that reason she tries to elevate her legs as much as she can at nighttime  but again she really cannot wear any type of compression. With regard to antibiotic it does not appear that she has been on anything recently. She was told by primary to just let this dry out she states that she really was not convinced that was the best thing to do due to her previous experience here at the wound care center for  that reason she has been trying to work on this on her own. Fortunately there is no evidence of local or systemic infection. No fevers, chills, nausea, vomiting, or diarrhea. 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 10/07/2019 on evaluation today patient presents for follow-up concerning her wound on the left lower extremity. She has been tolerating the dressing changes without complication. With that being said she just got the Santyl 2 days ago. Obviously it has not had enough time to really do what we need to do she would like to obviously give this some time before proceeding with more aggressive debridement or otherwise. 10/21/2019 upon evaluation today patient appears to be doing better with regard to the overall appearance of her wound is not quite as dry I do believe the Santyl has helped her improve to some degree here. Fortunately there is no signs of active infection at this time. I think we may be able to clean off the wound with a little bit of additional debridement today and likely we can transition away from the Santyl to some kind of collagen type dressing. 10/28/2019 upon evaluation today patient actually appears to be doing fairly well with regard to her wound today. She has been tolerating the dressing changes we did switch to collagen and fortunately that seems to be doing well for her at this point. There does not seem to be any signs of active infection I do feel like the surface of the wound is looking better at this time as well. 11/04/2019 upon evaluation today patient appears to be doing well with regard to her  wound. She is making some progress here and the wound does appear to be measuring smaller which is good news. Overall this is good to be somewhat slow but nonetheless I am encouraged by the fact that the collagen does seem to have helped her. 11/11/2019 on evaluation today patient's wound is showing some signs of improvement. The good news is her overall trend does seem to be towards getting better compared to where she was previous. The smaller wounds along the side actually might even be healed but we can watch this 1 more week before I heal this out. Overall I feel like there is improvement here. She is still using the silver collagen. Electronic Signature(s) Signed: 11/11/2019 8:57:40 AM By: Lenda KelpStone III, Christpoher Sievers PA-C Entered By: Lenda KelpStone III, Orren Pietsch on 11/11/2019 08:57:40 -------------------------------------------------------------------------------- Physical Exam Details Patient Name: Date of Service: Cheyenne LemonsSMITHEY, Cheyenne B. 11/11/2019 8:00 AM Medical Record [id number] Patient Account Number: [id number] Date of Birth/Sex: Treating RN: 12-Jan-1961 (59 y.o. [name] StandardF) Boehlein, Linda Primary Care Provider: Docia ChuckKoirala, Dibas Other Clinician: Referring Provider: Treating Provider/Extender:Stone III, Lizabeth LeydenHoyt Koirala, Dibas Weeks in Treatment: 6 Constitutional Well-nourished and well-hydrated in no acute distress. Respiratory normal breathing without difficulty. Psychiatric this patient is able to make decisions and demonstrates good insight into disease process. Alert and Oriented x 3. pleasant and cooperative. Notes Patient's wounds currently showed signs of good granulation at this time. Fortunately there is no evidence of active infection I did have to perform some sharp debridement to remove slough and biofilm from the surface of the wound which the patient tolerated today without complication and post debridement wound bed appears to be doing much better. Electronic Signature(s) Signed: 11/11/2019  8:58:00 AM By: Lenda KelpStone III, Leyah Bocchino PA-C Entered By: Lenda KelpStone III, Trevante Tennell on 11/11/2019 08:57:59 -------------------------------------------------------------------------------- Physician Orders Details Patient Name: Date of Service: Cheyenne AranSMITHEY, Cheyenne B. 11/11/2019 8:00 AM  Medical Record [id number] Patient Account Number: [id number] Date of Birth/Sex: Treating RN: 10-05-1960 (59 y.o. 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 Dressing Change Frequency Wound #2 Left,Anterior Lower Leg Change Dressing every other day. Wound #3 Left,Lateral Lower Leg Change Dressing every other day. Skin Barriers/Peri-Wound Care TCA Cream or Ointment - to periwound in clinic, use Mometasone at home Wound Cleansing Wound #2 Left,Anterior Lower Leg May shower and wash wound with soap and water. - scrub gently with gauze Wound #3 Left,Lateral Lower Leg May shower and wash wound with soap and water. - scrub gently with gauze Primary Wound Dressing Wound #2 Left,Anterior Lower Leg Silver Collagen - moisten with hydrogel or KY gel Wound #3 Left,Lateral Lower Leg Silver Collagen - moisten with hydrogel or KY gel Secondary Dressing Wound #2 Left,Anterior Lower Leg Kerlix/Rolled Gauze Telfa (non adherent pad) Wound #3 Left,Lateral Lower Leg Kerlix/Rolled Gauze Telfa (non  adherent pad) Edema Control Avoid standing for long periods of time Elevate legs to the level of the heart or above for 30 minutes daily and/or when sitting, a frequency of: - throughout the day Exercise regularly Patient Medications Allergies: doxycycline, Lyrica, Vivelle, Aleve, Motrin, adhesive bandage Notifications Medication Indication Start End benzocaine prior to 11/11/2019 debridement DOSE topical 20 % aerosol - aerosol topical Electronic Signature(s) Signed: 11/11/2019 5:29:37 PM By: Lenda Kelp PA-C Signed: 11/11/2019 5:43:44 PM By: Zenaida Deed RN, BSN Entered By: Zenaida Deed on 11/11/2019 08:56:04 -------------------------------------------------------------------------------- Problem List Details Patient Name: Date of Service: Cheyenne Aran B. 11/11/2019 8:00 AM Medical Record [id number] Patient Account Number: [id number] Date of Birth/Sex: Treating RN: 20-Nov-1960 (59 y.o. 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 11/11/2019 8:00 AM Medical Record [id number] Patient Account Number: [id number] Date of Birth/Sex: Treating RN: Jun 06, 1961 (59 y.o. [name] Standard Primary Care Provider: Docia Chuck, Dibas Other Clinician: Referring Provider: Treating Provider/Extender:Stone III, Lizabeth Leyden, Dibas Weeks in Treatment: 6 Subjective Chief Complaint Information obtained from Patient Left LE Ulcers History of Present Illness (HPI) 09/30/2019 upon evaluation today patient presents for initial inspection here in our clinic concerning issues that she has been experiencing with a wound on her left anterior lower leg. She unfortunately had a significant laceration which required 19 sutures and she shows me the pictures both  before as well as after sutured in a very good job was done in this regard. Unfortunately the skin did not take and necrosis. Subsequently this wound open and she has been having more significant issues with that since. Fortunately there is no evidence of active infection at this time which is good news. No fever chills noted. She has a history of chronic venous stasis but is no longer able to wear any compression in fact she cannot even wear regular socks due to her neuropathy and the pain that she experiences in her feet if she does. For that reason she tries to elevate her legs as much as she can at nighttime but again she really cannot wear any type of compression. With regard to antibiotic it does not appear that she has been on anything recently. 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 Fortunately there is no evidence  of local or systemic infection. No fevers, chills, nausea, vomiting, or diarrhea. 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 10/07/2019 on evaluation today patient presents for follow-up concerning her wound on the left lower extremity. She has been tolerating the dressing changes without complication. With that being said she just got the Santyl 2 days ago. Obviously it has not had enough time to really do what we need to do she would like to obviously give this some time before proceeding with more aggressive debridement or otherwise. 10/21/2019 upon evaluation today patient appears to be doing better with regard to the overall appearance of her wound is not quite as dry I do believe the Santyl has helped her improve to some degree here. Fortunately there is no signs of active infection at this time. I think we may be able to clean off the wound with a little bit of additional debridement today and likely we can transition away from the Santyl to some kind of collagen type dressing. 10/28/2019 upon evaluation today patient actually appears to be doing fairly well with regard to her wound today. She has been tolerating the dressing changes we did switch to collagen and fortunately that seems to be doing well for her at this point. There does not seem to be any signs of active infection I do feel like the surface of the wound is looking better at this time as well. 11/04/2019 upon evaluation today patient appears to be doing well with regard to her wound. She is making some progress here and the wound does appear to be measuring smaller which is good news. Overall this is good to be somewhat slow but nonetheless I am encouraged by the fact that the collagen does seem to have helped her. 11/11/2019 on evaluation today patient's wound is showing some signs of improvement. The good news is her overall trend does seem to be towards getting better compared to where she was  previous. The smaller wounds along the side actually might even be healed but we can watch this 1 more week before I heal this out. Overall I feel like there is improvement here. She is still using the silver collagen. Objective Constitutional Well-nourished and well-hydrated in no acute distress. Vitals Time Taken: 8:13 AM, Height: 66 in, Weight: 156 lbs,  BMI: 25.2, Temperature: 97.9 F, Pulse: 81 bpm, Respiratory Rate: 18 breaths/min, Blood Pressure: 126/77 mmHg. Respiratory normal breathing without difficulty. Psychiatric this patient is able to make decisions and demonstrates good insight into disease process. Alert and Oriented x 3. pleasant and cooperative. General Notes: Patient's wounds currently showed signs of good granulation at this time. Fortunately there is no evidence of active infection I did have to perform some sharp debridement to remove slough and biofilm from the surface of the wound which the patient tolerated today without complication and post debridement wound bed appears to be doing much better. Integumentary (Hair, Skin) Wound #2 status is Open. Original cause of wound was Trauma. The wound is located on the Left,Anterior Lower Leg. The wound measures 4.2cm length x 3.7cm width x 0.3cm depth; 12.205cm^2 area and 3.662cm^3 volume. There is Fat Layer (Subcutaneous Tissue) Exposed exposed. There is no tunneling or undermining noted. There is a medium amount of serosanguineous drainage noted. The wound margin is distinct with the outline attached to the wound base. There is medium (34-66%) pink granulation within the wound bed. There is a medium (34-66%) amount of necrotic tissue within the wound bed including Adherent Slough. Wound #3 status is Open. Original cause of wound was Trauma. The wound is located on the Left,Lateral Lower Leg. The wound measures 0.5cm length x 0.2cm width x 0.1cm depth; 0.079cm^2 area and 0.008cm^3 volume. There is Fat Layer (Subcutaneous  Tissue) Exposed exposed. There is no tunneling or undermining noted. There is a small amount of serosanguineous drainage noted. The wound margin is distinct with the outline attached to the wound base. There is small (1-33%) pink granulation within the wound bed. There is a large (67-100%) amount of necrotic tissue within the wound bed including Adherent Slough. 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 There was a Excisional Skin/Subcutaneous Tissue Debridement with a total area of 15.54 sq cm performed by Worthy Keeler, PA. With the following instrument(s): Curette to remove Viable and Non-Viable tissue/material. Material removed includes Subcutaneous Tissue and Slough and after achieving pain control using Other (benzocaine 20% spray). No specimens were taken. A time out was conducted at 08:50, prior to the start of the procedure. A Minimum amount of bleeding was controlled with Pressure. The procedure was tolerated well with a pain level of 3 throughout and a pain level of 1 following the procedure. Post Debridement Measurements: 4.2cm length x 3.7cm width x 0.3cm depth; 3.662cm^3 volume. Character of Wound/Ulcer Post Debridement is improved. Severity of Tissue Post Debridement is: Fat layer exposed. Post procedure Diagnosis Wound #2: Same as Pre-Procedure Plan Follow-up Appointments: Return Appointment in 1 week. Dressing Change Frequency: Wound #2 Left,Anterior Lower  Leg: Change Dressing every other day. Wound #3 Left,Lateral Lower Leg: Change Dressing every other day. Skin Barriers/Peri-Wound Care: TCA Cream or Ointment - to periwound in clinic, use Mometasone at home Wound Cleansing: Wound #2 Left,Anterior Lower Leg: May shower and wash wound with soap and water. - scrub gently with gauze Wound #3 Left,Lateral Lower Leg: May shower and wash wound with soap and water. - scrub  gently with gauze Primary Wound Dressing: Wound #2 Left,Anterior Lower Leg: Silver Collagen - moisten with hydrogel or KY gel Wound #3 Left,Lateral Lower Leg: Silver Collagen - moisten with hydrogel or KY gel Secondary Dressing: Wound #2 Left,Anterior Lower Leg: Kerlix/Rolled Gauze Telfa (non adherent pad) Wound #3 Left,Lateral Lower Leg: Kerlix/Rolled Gauze Telfa (non adherent pad) Edema Control: Avoid standing for long periods of time Elevate legs to the level of the heart or above for 30 minutes daily and/or when sitting, a frequency of: - throughout the day Exercise regularly The following medication(s) was prescribed: benzocaine topical 20 % aerosol aerosol topical for prior to debridement was prescribed at facility 1. My suggestion at this time is can be that we go ahead and continue with the silver collagen dressing I think that still the best thing at this point for the patient. 2. I am also can recommend she continue to change the dressing on a fairly regular basis every other day she has been doing this without complication. 3. I am also can recommend that she as much as possible try to elevate her legs. Obviously the more that she can do the better. When she is not elevating the issue there comes into play that again were not really able to compress her therefore she can have more edema which will slow down her healing going forward. We will see patient back for reevaluation in 1 week here in the clinic. If anything worsens or changes patient will contact  our office for additional recommendations. Electronic Signature(s) Signed: 11/11/2019 8:58:50 AM By: Lenda Kelp PA-C Entered By: Lenda Kelp on 11/11/2019 08:58:50 -------------------------------------------------------------------------------- SuperBill Details Patient Name: Date of Service: [name] 11/11/2019 Medical Record [id number] Patient Account Number: [id number] Date of Birth/Sex: Treating RN: [birth year] (59 y.o. 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

## 2019-11-18 ENCOUNTER — Other Ambulatory Visit: Payer: Self-pay

## 2019-11-18 ENCOUNTER — Encounter (HOSPITAL_BASED_OUTPATIENT_CLINIC_OR_DEPARTMENT_OTHER): Payer: Worker's Compensation | Admitting: Physician Assistant

## 2019-11-18 DIAGNOSIS — I872 Venous insufficiency (chronic) (peripheral): Secondary | ICD-10-CM | POA: Diagnosis not present

## 2019-11-18 NOTE — Progress Notes (Signed)
STPEHANIE, MONTROY ([id number]) Visit Report for 11/18/2019 Chief Complaint Document Details Patient Name: Date of Service: [name], [name] 11/18/2019 8:00 AM Medical Record [id number] Patient Account Number: [id number] Date of Birth/Sex: Treating RN: 1961-08-12 (59 y.o. [name]: [name] Other Clinician: Referring Richardson: Treating Richardson/Extender:[name], [name]: 7 Information Obtained from: Patient Chief Complaint Left LE Ulcers Electronic Signature(s) Signed: 11/18/2019 1:07:46 PM By: Worthy Keeler PA-C Entered By: Worthy Keeler on 11/18/2019 71:69:67 -------------------------------------------------------------------------------- HPI Details Patient Name: Date of Service: Cheyenne Mccallum B. 11/18/2019 8:00 AM Medical Record [id number] Patient Account Number: [id number] Date of Birth/Sex: Treating RN: [birth year] (59 y.o. [name]: [name] Other Clinician: Referring Richardson: Treating Richardson/Extender:[name], [name]: 7 History of Present Illness HPI Description: 09/30/2019 upon evaluation today patient presents for initial inspection here in our clinic concerning issues that she has been experiencing with a wound on her left anterior lower leg. She unfortunately had a significant laceration which required 19 sutures and she shows me the pictures both before as well as after sutured in a very good job was done in this regard. Unfortunately the skin did not take and necrosis. Subsequently this wound open and she has been having more significant issues with that since. Fortunately there is no evidence of active infection at this time which is good news. No fever chills noted. She has a history of chronic venous stasis but is no longer able to wear any compression in fact she cannot even wear regular socks due to her  neuropathy and the pain that she experiences in her feet if she does. For that reason she tries to elevate her legs as much as she can at nighttime but again she really cannot wear any type of compression. With regard to antibiotic it does not appear that she has been on anything recently. 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 Fortunately there is no evidence of local or systemic infection. No fevers, chills, nausea, vomiting, or diarrhea. 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 10/07/2019 on evaluation today patient presents for follow-up concerning her wound on the left lower extremity. She has been tolerating the dressing changes without complication. With that being said she just got the Santyl 2 days ago. Obviously it has not had enough time to really do what we need to do she would like to obviously give this some time before proceeding with more aggressive debridement or otherwise. 10/21/2019 upon evaluation today patient appears to be doing better with regard to the overall appearance of her wound is not quite as dry I do believe the Santyl has helped her improve to some degree here. Fortunately there is no signs of active infection at this time. I think we may be able to clean off the wound with a little bit of additional debridement today and likely we can transition away from the Santyl to some kind of collagen type dressing. 10/28/2019 upon evaluation today patient actually appears to be doing fairly well with regard to her wound today. She has been tolerating the dressing changes we did switch to collagen and  fortunately that seems to be doing well for her at this point. There does not seem to be any signs of active infection I do feel like the  surface of the wound is looking better at this time as well. 11/04/2019 upon evaluation today patient appears to be doing well with regard to her wound. She is making some progress here and the wound does appear to be measuring smaller which is good news. Overall this is good to be somewhat slow but nonetheless I am encouraged by the fact that the collagen does seem to have helped her. 11/11/2019 on evaluation today patient's wound is showing some signs of improvement. The good news is her overall trend does seem to be towards getting better compared to where she was previous. The smaller wounds along the side actually might even be healed but we can watch this 1 more week before I heal this out. Overall I feel like there is improvement here. She is still using the silver collagen. 11/18/2019 upon evaluation today patient appears to be doing about the same with regard to her wound. This is not significantly smaller there still appears to be a lot of inflammation. For that reason I do want to see about actually applying triamcinolone to the actual wound bed to see if this can be of benefit as well. I thought about this for couple weeks but I think it may be a good thing to proceed with at this point. Electronic Signature(s) Signed: 11/18/2019 9:18:02 AM By: Lenda Kelp PA-C Entered By: Lenda Kelp on 11/18/2019 09:18:02 -------------------------------------------------------------------------------- Physical Exam Details Patient Name: Date of Service: [name] 11/18/2019 8:00 AM Medical Record [id number] Patient Account Number: [id number] Date of Birth/Sex: Treating RN: 04/14/1961 (59 y.o. [name] Primary Care Richardson: Cheyenne Chuck, Richardson Other Clinician: Referring Richardson: Treating Richardson/Extender:Cheyenne III, [name], [name]: 7 Constitutional Well-nourished and well-hydrated in no acute distress. Respiratory normal breathing without  difficulty. Psychiatric this patient is able to make decisions and demonstrates good insight into disease process. Alert and Oriented x 3. pleasant and cooperative. Notes Patient's wound did not really have significant slough buildup no sharp debridement was performed today. With that being said I am going to go ahead and send in a prescription for triamcinolone for her in order to see if this can be beneficial she will put this on the wound bed as well as the periwound at this point. Electronic Signature(s) Signed: 11/18/2019 9:18:32 AM By: Lenda Kelp PA-C Entered By: Lenda Kelp on 11/18/2019 09:18:32 -------------------------------------------------------------------------------- Physician Orders Details Patient Name: Date of Service: ARLETTE, SCHAAD 11/18/2019 8:00 AM Medical Record [id number] Patient Account Number: [id number] Date of Birth/Sex: Treating RN: 27-May-1961 (59 y.o. 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 Dressing Change Frequency Wound #2 Left,Anterior Lower Leg Change Dressing every other day. Skin Barriers/Peri-Wound Care TCA Cream or Ointment - triamcinolone cream to periwound and thin layer over wound bed Wound Cleansing Wound #2 Left,Anterior Lower Leg May shower and  wash wound with soap and water. -  scrub gently with gauze Primary Wound Dressing Wound #2 Left,Anterior Lower Leg Silver Collagen Secondary Dressing Wound #2 Left,Anterior Lower Leg Kerlix/Rolled Gauze Telfa (non adherent pad) Edema Control Avoid standing for long periods of time Elevate legs to the level of the heart or above for 30 minutes daily and/or when sitting, a frequency of: - throughout the day Exercise regularly Patient Medications Allergies: doxycycline, Lyrica, Vivelle, Aleve, Motrin, adhesive bandage Notifications Medication Indication Start End triamcinolone acetonide 11/18/2019 DOSE topical 0.1 % ointment - ointment topical applied to the wound bed with each dressing change as directed Electronic Signature(s) Signed: 11/18/2019 9:20:09 AM By: Lenda Kelp PA-C Entered By: Lenda Kelp on 11/18/2019 09:20:07 -------------------------------------------------------------------------------- Problem List Details Patient Name: Date of Service: Cheyenne Aran B. 11/18/2019 8:00 AM Medical Record [id number] Patient Account Number: [id number] Date of Birth/Sex: Treating RN: 09-20-1960 (59 y.o. 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 11/18/2019 8:00 AM Medical Record [id number] Patient Account Number: [id number] Date of Birth/Sex: Treating RN: 1960/12/11 (59 y.o. [name] Primary Care Richardson: Cheyenne Chuck, Richardson Other Clinician: Referring Richardson: Treating Richardson/Extender:Cheyenne III, [name], [name]: 7 Subjective Chief Complaint Information obtained from Patient Left LE Ulcers History of Present Illness (HPI) 09/30/2019 upon evaluation today patient presents for initial inspection here in our clinic concerning issues that she has been experiencing with a wound on her left anterior lower leg. She unfortunately had a significant laceration which required 19 sutures and she shows me the pictures both before as well as after sutured in a very good job was done in this regard. Unfortunately the skin did not take and necrosis. Subsequently this wound open and she has been having more significant issues with that since. Fortunately there is no evidence of active infection at this time which is good news. No fever chills noted. She has a history of chronic venous stasis but is no longer able to wear any compression in fact she cannot even wear regular socks due to her neuropathy and the pain that she experiences in her feet if she does. For that reason she tries to elevate her legs as much as she can at nighttime but again she really cannot wear any type of compression. With regard to antibiotic it does not appear that she has been on anything recently. She was told by primary to just let this dry out she states that she really was not convinced that was the best thing to do due to her previous experience here at the wound  care center for that reason she has been trying to work on this on her own. Fortunately there is no evidence of local or systemic infection. No fevers, chills,  nausea, vomiting, or diarrhea. 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 10/07/2019 on evaluation today patient presents for follow-up concerning her wound on the left lower extremity. She has been tolerating the dressing changes without complication. With that being said she just got the Santyl 2 days ago. Obviously it has not had enough time to really do what we need to do she would like to obviously give this some time before proceeding with more aggressive debridement or otherwise. 10/21/2019 upon evaluation today patient appears to be doing better with regard to the overall appearance of her wound is not quite as dry I do believe the Santyl has helped her improve to some degree here. Fortunately there is no signs of active infection at this time. I think we may be able to clean off the wound with a little bit of additional debridement today and likely we can transition away from the Santyl to some kind of collagen type dressing. 10/28/2019 upon evaluation today patient actually appears to be doing fairly well with regard to her wound today. She has been tolerating the dressing changes we did switch to collagen and fortunately that seems to be doing well for her at this point. There does not seem to be any signs of active infection I do feel like the surface of the wound is looking better at this time as well. 11/04/2019 upon evaluation today patient appears to be doing well with regard to her wound. She is making some progress here and the wound does appear to be measuring smaller which is good news. Overall this is good to be somewhat slow but nonetheless I am encouraged by the fact that the collagen does seem to have helped her. 11/11/2019 on evaluation today patient's wound is showing some signs of  improvement. The good news is her overall trend does seem to be towards getting better compared to where she was previous. The smaller wounds along the side actually might even be healed but we can watch this 1 more week before I heal this out. Overall I feel like there is improvement here. She is still using the silver collagen. 11/18/2019 upon evaluation today patient appears to be doing about the same with regard to her wound. This is not significantly smaller there still appears to be a lot of inflammation. For that reason I do want to see about actually applying triamcinolone to the actual wound bed to see if this can be of benefit as well. I thought about this for couple weeks but I think it may be a good thing to proceed with at this point. Objective Constitutional Well-nourished and well-hydrated in no acute distress. Vitals Time Taken: 8:00 AM, Height: 66 in, Weight: 156 lbs, BMI: 25.2, Temperature: 97.8 F, Pulse: 73 bpm, Respiratory Rate: 18 breaths/min, Blood Pressure: 124/79 mmHg. Respiratory normal breathing without difficulty. Psychiatric this patient is able to make decisions and demonstrates good insight into disease process. Alert and Oriented x 3. pleasant and cooperative. General Notes: Patient's wound did not really have significant slough buildup no sharp debridement was performed today. With that being said I am going to go ahead and send in a prescription for triamcinolone for her in order to see if this can be beneficial she will put this on the wound bed as well as the periwound at this point. Integumentary (Hair, Skin) Wound #2 status is Open. Original cause of wound was Trauma. The wound is located on the  Left,Anterior Lower Leg. The wound measures 5cm length x 3.5cm width x 0.3cm depth; 13.744cm^2 area and 4.123cm^3 volume. There is Fat Layer (Subcutaneous Tissue) Exposed exposed. There is no tunneling or undermining noted. There is a medium amount of  serosanguineous drainage noted. The wound margin is distinct with the outline attached to the wound base. There is medium (34-66%) pink granulation within the wound bed. There is a medium (34-66%) amount of necrotic tissue within the wound bed including Adherent Slough. Wound #3 status is Healed - Epithelialized. Original cause of wound was Trauma. The wound is located on the Left,Lateral Lower Leg. The wound measures 0cm length x 0cm width x 0cm depth; 0cm^2 area and 0cm^3 volume. There is no tunneling or undermining noted. There is a none present amount of drainage noted. The wound margin is distinct with the outline attached to the wound base. There is no granulation within the wound bed. There is no necrotic tissue within the wound bed. 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 Dressing Change Frequency: Wound #2 Left,Anterior Lower Leg: Change Dressing every other day. Skin Barriers/Peri-Wound Care: TCA Cream or Ointment - triamcinolone cream to periwound and thin layer over wound bed Wound Cleansing: Wound #2 Left,Anterior Lower Leg: May shower and wash wound with soap and water. - scrub gently with gauze Primary Wound Dressing: Wound #2 Left,Anterior Lower Leg: Silver Collagen Secondary Dressing: Wound #2 Left,Anterior Lower Leg: Kerlix/Rolled Gauze Telfa (non adherent pad) Edema Control: Avoid standing for long periods of time Elevate legs to the level of the heart or above for 30 minutes daily and/or when sitting, a frequency of: - throughout the day Exercise regularly The following  medication(s) was prescribed: triamcinolone acetonide topical 0.1 % ointment ointment topical applied to the wound bed with each dressing change as directed starting 11/18/2019 1. My suggestion currently is good to be that we go ahead and continue with the collagen although we will use triamcinolone to moisten the collagen as opposed to the hydrogel at this point. 2. I am also going to recommend that the patient continue with elevation as much as possible to try to keep the edema under good control again we have really been able to wrap her due to the fact that she has significant neuropathy unfortunately. If we were able to wrap her this might even do better but right now we are dealing with what we can. 3. As far as infection I do not see any evidence of infection at this point we will continue to monitor for this and if anything changes in that regard we will address it as needed. We will see patient back for reevaluation in 1 week here in the clinic. If anything worsens or changes patient will contact our office for additional recommendations. Electronic Signature(s) Signed: 11/18/2019 9:20:18 AM By: Lenda Kelp PA-C Entered By: Lenda Kelp on 11/18/2019 09:20:18 -------------------------------------------------------------------------------- SuperBill Details Patient Name: Date of Service: [name] 11/18/2019 Medical Record [id number] Patient Account Number: [id number] Date of Birth/Sex: Treating RN: 1961-05-06 (59 y.o. 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, unspecified Facility Procedures CPT4 Code: [id number] Description: 99213 - WOUND CARE VISIT-LEV 3 EST PT Modifier: Quantity: 1 Physician Procedures CPT4 Code Description: [id number] 99214 - WC PHYS LEVEL 4 - EST PT ICD-10 Diagnosis Description I87.2 Venous insufficiency (chronic) (peripheral) L97.822 Non-pressure chronic ulcer of other part of left lower le L97.828 Non-pressure chronic ulcer of other  part of left lower le severity 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

## 2019-11-23 NOTE — Progress Notes (Signed)
ARTRICE, KRAKER ([id number]) Visit Report for 11/18/2019 Arrival Information Details Patient Name: Date of Service: Cheyenne, Richardson 11/18/2019 8:00 AM Medical Record [id number] Patient Account Number: [id number] Date of Birth/Sex: Treating RN: 10-09-1960 (59 y.o. [name] Primary Care Trejuan Matherne: Docia Chuck, Dibas Other Clinician: Referring Armeda Plumb: Treating Kamauri Kathol/Extender:Stone III, Lizabeth Leyden, Dibas Weeks in Treatment: 7 Visit Information History Since Last Visit Added or deleted any medications: No Patient Arrived: Ambulatory Any new allergies or adverse reactions: No Arrival Time: 08:00 Had a fall or experienced change in No Accompanied By: alone activities of daily living that may affect Transfer Assistance: None risk of falls: Patient Identification Verified: Yes Signs or symptoms of abuse/neglect since last No Secondary Verification Process Yes visito Completed: Hospitalized since last visit: No Patient Requires Transmission-Based No Implantable device outside of the clinic excluding No Precautions: cellular tissue based products placed in the center Patient Has Alerts: Yes since last visit: Has Dressing in Place as Prescribed: Yes Pain Present Now: No Electronic Signature(s) Signed: 11/23/2019 5:18:21 PM By: Zandra Abts RN, BSN Entered By: Zandra Abts on 11/18/2019 08:00:39 -------------------------------------------------------------------------------- Clinic Level of Care Assessment Details Patient Name: Date of Service: KYANN, HEYDT 11/18/2019 8:00 AM Medical Record [id number] Patient Account Number: [id number] Date of Birth/Sex: Treating RN: 02/09/1961 (59 y.o. [name] Primary Care Jadyn Barge: Docia Chuck, Dibas Other Clinician: Referring Mcguire Gasparyan: Treating Kameron Glazebrook/Extender:Stone III, Lizabeth Leyden, Dibas Weeks in Treatment: 7 Clinic Level of Care Assessment Items TOOL 4 Quantity Score []  - Use when only an EandM  is performed on FOLLOW-UP visit 0 ASSESSMENTS - Nursing Assessment / Reassessment X - Reassessment of Co-morbidities (includes updates in patient status) 1 10 X - Reassessment of Adherence to Treatment Plan 1 5 ASSESSMENTS - Wound and Skin Assessment / Reassessment []  - Simple Wound Assessment / Reassessment - one wound 0 X - Complex Wound Assessment / Reassessment - multiple wounds 2 5 []  - Dermatologic / Skin Assessment (not related to wound area) 0 ASSESSMENTS - Focused Assessment []  - Circumferential Edema Measurements - multi extremities 0 []  - Nutritional Assessment / Counseling / Intervention 0 X - Lower Extremity Assessment (monofilament, tuning fork, pulses) 1 5 []  - Peripheral Arterial Disease Assessment (using hand held doppler) 0 ASSESSMENTS - Ostomy and/or Continence Assessment and Care []  - Incontinence Assessment and Management 0 []  - Ostomy Care Assessment and Management (repouching, etc.) 0 PROCESS - Coordination of Care X - Simple Patient / Family Education for ongoing care 1 15 []  - Complex (extensive) Patient / Family Education for ongoing care 0 X - Staff obtains , Records, Test Results / Process Orders 1 10 []  - Staff telephones HHA, Nursing Homes / Clarify orders / etc 0 []  - Routine Transfer to another Facility (non-emergent condition) 0 []  - Routine Hospital Admission (non-emergent condition) 0 []  - New Admissions / / Ordering NPWT, Apligraf, etc. 0 []  - Emergency Hospital Admission (emergent condition) 0 X - Simple Discharge Coordination 1 10 []  - Complex (extensive) Discharge Coordination 0 PROCESS - Special Needs []  - Pediatric / Minor Patient Management 0 []  - Isolation Patient Management 0 []  - Hearing / Language / Visual special needs 0 []  - Assessment of Community assistance (transportation, D/C planning, etc.) 0 []  - Additional assistance / Altered mentation 0 []  - Support Surface(s) Assessment (bed, cushion, seat,  etc.) 0 INTERVENTIONS - Wound Cleansing / Measurement []  - Simple Wound Cleansing - one wound 0 X - Complex Wound Cleansing - multiple wounds 2 5 X -  Wound Imaging (photographs - any number of wounds) 1 5 []  - Wound Tracing (instead of photographs) 0 []  - Simple Wound Measurement - one wound 0 X - Complex Wound Measurement - multiple wounds 2 5 INTERVENTIONS - Wound Dressings X - Small Wound Dressing one or multiple wounds 1 10 []  - Medium Wound Dressing one or multiple wounds 0 []  - Large Wound Dressing one or multiple wounds 0 X - Application of Medications - topical 1 5 []  - Application of Medications - injection 0 INTERVENTIONS - Miscellaneous []  - External ear exam 0 []  - Specimen Collection (cultures, biopsies, blood, body fluids, etc.) 0 []  - Specimen(s) / Culture(s) sent or taken to Lab for analysis 0 []  - Patient Transfer (multiple staff / / Similar devices) 0 []  - Simple Staple / Suture removal (25 or less) 0 []  - Complex Staple / Suture removal (26 or more) 0 []  - Hypo / Hyperglycemic Management (close monitor of Blood Glucose) 0 []  - Ankle / Brachial Index (ABI) - do not check if billed separately 0 X - Vital Signs 1 5 Has the patient been seen at the hospital within the last three years: Yes Total Score: 110 Level Of Care: New/Established - Level 3 Electronic Signature(s) Signed: 11/18/2019 4:42:28 PM By: RN, BSN Entered By: on 11/18/2019 09:16:19 -------------------------------------------------------------------------------- Encounter Discharge Information Details Patient Name: Date of Service: B. 11/18/2019 8:00 AM Medical Record Patient Account Number: Date of Birth/Sex: Treating RN: 02/26/1961 (59 y.o. Primary Care Amity Roes: , Dibas Other Clinician: Referring Adayah Arocho: Treating Bitania Shankland/Extender:Stone III, , Dibas Weeks in Treatment:  7 Encounter Discharge Information Items Discharge Condition: Stable Ambulatory Status: Ambulatory Discharge Destination: Home Transportation: Private Auto Accompanied By: self Schedule Follow-up Appointment: Yes Clinical Summary of Care: Patient Declined Electronic Signature(s) Signed: 11/20/2019 5:08:14 PM By: Zenaida Deed RN Entered By: Zenaida Deed on 11/18/2019 09:30:34 -------------------------------------------------------------------------------- Lower Extremity Assessment Details Patient Name: Date of Service: Cheyenne, Richardson 11/18/2019 8:00 AM Medical Record [id number] Patient Account Number: [id number] Date of Birth/Sex: Treating RN: [birth year] (59 y.o. Freddy Finner Primary Care Kalyssa Anker: Orting, Dibas Other Clinician: Referring Braeley Buskey: Treating Babe Anthis/Extender:Stone III, Lizabeth Leyden, Dibas Weeks in Treatment: 7 Edema Assessment Assessed: [Left: No] [Right: No] Edema: [Left: Ye] [Right: s] Calf Left: Right: Point of Measurement: 29 cm From Medial Instep 33.4 cm cm Ankle Left: Right: Point of Measurement: 8 cm From Medial Instep 20 cm cm Vascular Assessment Pulses: Dorsalis Pedis Palpable: [Left:Yes] Electronic Signature(s) Signed: 11/23/2019 5:18:21 PM By: Yevonne Pax RN, BSN Entered By: Yevonne Pax on 11/18/2019 08:09:59 -------------------------------------------------------------------------------- Multi-Disciplinary Care Plan Details Patient Name: Date of Service: [name] B. 11/18/2019 8:00 AM Medical Record [id number] Patient Account Number: [id number] Date of Birth/Sex: Treating RN: 1961/05/15 (59 y.o. [name] Primary Care Porcha Deblanc: Eastman, Dibas Other Clinician: Referring Zoriah Pulice: Treating Kweku Stankey/Extender:Stone III, Lizabeth Leyden, Dibas Weeks in Treatment: 7 Active Inactive Venous Leg Ulcer Nursing Diagnoses: Knowledge deficit related to disease process and management Potential for venous Insuffiency  (use before diagnosis confirmed) Goals: Patient will maintain optimal edema control Date Initiated: 09/30/2019 Target Resolution Date: 11/25/2019 Goal Status: Active Patient/caregiver will verbalize understanding of disease process and disease management Date Initiated: 09/30/2019 Target Resolution Date: 11/25/2019 Goal Status: Active Interventions: Assess peripheral edema status every visit. Compression as ordered Provide education on venous insufficiency Treatment Activities: Therapeutic compression applied : 09/30/2019 Notes: Wound/Skin Impairment Nursing Diagnoses: Impaired tissue integrity Knowledge deficit related to ulceration/compromised skin  integrity Goals: Patient/caregiver will verbalize understanding of skin care regimen Date Initiated: 09/30/2019 Target Resolution Date: 11/25/2019 Goal Status: Active Ulcer/skin breakdown will have a volume reduction of 30% by week 4 Date Initiated: 09/30/2019 Date Inactivated: 10/28/2019 Target Resolution Date: 10/28/2019 Unmet Reason: other Goal Status: Unmet comorbidities Interventions: Assess patient/caregiver ability to obtain necessary supplies Assess patient/caregiver ability to perform ulcer/skin care regimen upon admission and as needed Assess ulceration(s) every visit Provide education on ulcer and skin care Treatment Activities: Skin care regimen initiated : 09/30/2019 Topical wound management initiated : 09/30/2019 Notes: Electronic Signature(s) Signed: 11/18/2019 4:42:28 PM By: Baruch Gouty RN, BSN Entered By: Baruch Gouty on 11/18/2019 08:19:47 -------------------------------------------------------------------------------- Pain Assessment Details Patient Name: Date of Service: [name] 11/18/2019 8:00 AM Medical Record [id number] Patient Account Number: [id number] Date of Birth/Sex: Treating RN: 14-Nov-1960 (59 y.o. [name] Primary Care Swan Zayed: [name], Dibas Other Clinician: Referring  Navarro Nine: Treating Breionna Punt/Extender:Stone III, Sharen Counter, Dibas Weeks in Treatment: 7 Active Problems Location of Pain Severity and Description of Pain Patient Has Paino No Site Locations Pain Management and Medication Current Pain Management: Electronic Signature(s) Signed: 11/23/2019 5:18:21 PM By: Levan Hurst RN, BSN Entered By: Levan Hurst on 11/18/2019 08:00:53 -------------------------------------------------------------------------------- Patient/Caregiver Education Details Patient Name: Date of Service: Alkhatib, Tramaine B. 3/17/2021andnbsp8:00 AM Medical Record [id number] Patient Account Number: [id number] Date of Birth/Gender: Treating RN: September 21, 1960 (59 y.o. [name] Primary Care Physician: [name], Dibas Other Clinician: Referring Physician: Treating Physician/Extender:Stone III, Sharen Counter, Dibas Weeks in Treatment: 7 Education Assessment Education Provided To: Patient Education Topics Provided Venous: Methods: Explain/Verbal Responses: Reinforcements needed, State content correctly Wound/Skin Impairment: Methods: Explain/Verbal Responses: Reinforcements needed, State content correctly Electronic Signature(s) Signed: 11/18/2019 4:42:28 PM By: Baruch Gouty RN, BSN Entered By: Baruch Gouty on 11/18/2019 08:20:06 -------------------------------------------------------------------------------- Wound Assessment Details Patient Name: Date of Service: [name] 11/18/2019 8:00 AM Medical Record [id number] Patient Account Number: [id number] Date of Birth/Sex: Treating RN: 23-Apr-1961 (59 y.o. [name] Primary Care Lillyahna Hemberger: [name], Dibas Other Clinician: Referring Jarryd Gratz: Treating Iam Lipson/Extender:Stone III, Sharen Counter, Dibas Weeks in Treatment: 7 Wound Status Wound Number: 2 Primary Venous Leg Ulcer Etiology: Wound Location: Left, Anterior Lower Leg Wound Status: Open Wounding Event: Trauma Comorbid  Lymphedema, Raynauds, Date Acquired: 08/06/2019 History: Neuropathy Weeks Of Treatment: 7 Clustered Wound: No Photos Photo Uploaded By: Mikeal Hawthorne on 11/19/2019 09:59:10 Wound Measurements Length: (cm) 5 % Reduct Width: (cm) 3.5 % Reduct Depth: (cm) 0.3 Epitheli Area: (cm) 13.744 Tunneli Volume: (cm) 4.123 Undermi Wound Description Full Thickness Without Exposed Support Foul Od Classification: Structures Slough/ Wound Distinct, outline attached Margin: Exudate Medium Amount: Exudate Serosanguineous Type: Exudate red, brown Color: Wound Bed Granulation Amount: Medium (34-66%) Granulation Quality: Pink Fascia E Necrotic Amount: Medium (34-66%) Fat Laye Necrotic Quality: Adherent Slough Tendon E Muscle E Joint Ex Bone Exp or After Cleansing: No Fibrino Yes Exposed Structure xposed: No r (Subcutaneous Tissue) Exposed: Yes xposed: No xposed: No posed: No osed: No ion in Area: 24.6% ion in Volume: -13.1% alization: Small (1-33%) ng: No ning: No Treatment Notes Wound #2 (Left, Anterior Lower Leg) 1. Cleanse With Wound Cleanser 2. Periwound Care TCA Cream 3. Primary Dressing Applied Col[employer] Notes secondary telfa pad. Electronic Signature(s) Signed: 11/23/2019 5:18:21 PM By: Levan Hurst RN, BSN Entered By: Levan Hurst on 11/18/2019 08:12:02 -------------------------------------------------------------------------------- Wound Assessment Details Patient Name: Date of Service: [name] 11/18/2019 8:00 AM Medical Record [id number] Patient Account Number: [id number] Date of Birth/Sex: Treating RN: [birth year] (59 y.o. F) Boehlein,  Linda Primary Care Dailynn Nancarrow: Docia Chuck, Dibas Other Clinician: Referring Matilde Pottenger: Treating Hanaa Payes/Extender:Stone III, Lizabeth Leyden, Dibas Weeks in Treatment: 7 Wound Status Wound Number: 3 Primary Venous Leg Ulcer Etiology: Wound Location: Left, Lateral Lower Leg Wound Status: Healed -  Epithelialized Wounding Event: Trauma Comorbid Lymphedema, Raynauds, Date Acquired: 08/06/2019 History: Neuropathy Weeks Of Treatment: 7 Clustered Wound: No Photos Photo Uploaded By: Benjaman Kindler on 11/19/2019 09:59:10 Wound Measurements Length: (cm) 0 % Reduct Width: (cm) 0 % Reduct Depth: (cm) 0 Epitheli Area: (cm) 0 Tunneli Volume: (cm) 0 Undermi Wound Description Classification: Full Thickness Without Exposed Support Foul Od Structures Slough/ Wound Distinct, outline attached Margin: Exudate None Present Amount: Wound Bed Granulation Amount: None Present (0%) Necrotic Amount: None Present (0%) Fascia E Fat Laye Tendon E [place] Joint Expos Bone Expose or After Cleansing: No Fibrino No Exposed Structure xposed: No r (Subcutaneous Tissue) Exposed: No xposed: No sed: No ed: No d: No ion in Area: 100% ion in Volume: 100% alization: Large (67-100%) ng: No ning: No Electronic Signature(s) Signed: 11/18/2019 4:42:28 PM By: Zenaida Deed RN, BSN Entered By: Zenaida Deed on 11/18/2019 09:05:27 -------------------------------------------------------------------------------- Vitals Details Patient Name: Date of Service: [name] 11/18/2019 8:00 AM Medical Record [id number] Patient Account Number: [id number] Date of Birth/Sex: Treating RN: 01/23/1961 (59 y.o. [name] Primary Care Madelene Kaatz: Docia Chuck, Dibas Other Clinician: Referring Tamarick Kovalcik: Treating Deonte Otting/Extender:Stone III, Lizabeth Leyden, Dibas Weeks in Treatment: 7 Vital Signs Time Taken: 08:00 Temperature (F): 97.8 Height (in): 66 Pulse (bpm): 73 Weight (lbs): 156 Respiratory Rate (breaths/min): 18 Body Mass Index (BMI): 25.2 Blood Pressure (mmHg): 124/79 Reference Range: 80 - 120 mg / dl Electronic Signature(s) Signed: 11/23/2019 5:18:21 PM By: Zandra Abts RN, BSN Entered By: Zandra Abts on 11/18/2019 08:03:39

## 2019-11-25 ENCOUNTER — Encounter (HOSPITAL_BASED_OUTPATIENT_CLINIC_OR_DEPARTMENT_OTHER): Payer: Worker's Compensation | Admitting: Physician Assistant

## 2019-11-25 ENCOUNTER — Other Ambulatory Visit: Payer: Self-pay

## 2019-11-25 DIAGNOSIS — I872 Venous insufficiency (chronic) (peripheral): Secondary | ICD-10-CM | POA: Diagnosis not present

## 2019-11-25 NOTE — Progress Notes (Addendum)
LABELLA, ZAHRADNIK ([id number]) Visit Report for 11/25/2019 Chief Complaint Document Details Patient Name: Date of Service: [name], [name] 11/25/2019 8:00 AM Medical Record [id number] Patient Account Number: [id number] Date of Birth/Sex: Treating RN: 09-13-1960 (59 y.o. [name] Standard Primary Care Provider: Docia Chuck, Dibas Other Clinician: Referring Provider: Treating Provider/Extender:Stone III, Lizabeth Leyden, Dibas Weeks in Treatment: 8 Information Obtained from: Patient Chief Complaint Left LE Ulcers Electronic Signature(s) Signed: 11/25/2019 8:29:15 AM By: Lenda Kelp PA-C Entered By: Lenda Kelp on 11/25/2019 08:29:15 -------------------------------------------------------------------------------- HPI Details Patient Name: Date of Service: Cheyenne Aran B. 11/25/2019 8:00 AM Medical Record [id number] Patient Account Number: [id number] Date of Birth/Sex: Treating RN: [birth year] (59 y.o. [name] Standard Primary Care Provider: Docia Chuck, Dibas Other Clinician: Referring Provider: Treating Provider/Extender:Stone III, Lizabeth Leyden, Dibas Weeks in Treatment: 8 History of Present Illness HPI Description: 09/30/2019 upon evaluation today patient presents for initial inspection here in our clinic concerning issues that she has been experiencing with a wound on her left anterior lower leg. She unfortunately had a significant laceration which required 19 sutures and she shows me the pictures both before as well as after sutured in a very good job was done in this regard. Unfortunately the skin did not take and necrosis. Subsequently this wound open and she has been having more significant issues with that since. Fortunately there is no evidence of active infection at this time which is good news. No fever chills noted. She has a history of chronic venous stasis but is no longer able to wear any compression in fact she cannot even wear regular socks due to her  neuropathy and the pain that she experiences in her feet if she does. For that reason she tries to elevate her legs as much as she can at nighttime but again she really cannot wear any type of compression. With regard to antibiotic it does not appear that she has been on anything recently. 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 Fortunately there is no evidence of local or systemic infection. No fevers, chills, nausea, vomiting, or diarrhea. 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 10/07/2019 on evaluation today patient presents for follow-up concerning her wound on the left lower extremity. She has been tolerating the dressing changes without complication. With that being said she just got the Santyl 2 days ago. Obviously it has not had enough time to really do what we need to do she would like to obviously give this some time before proceeding with more aggressive debridement or otherwise. 10/21/2019 upon evaluation today patient appears to be doing better with regard to the overall appearance of her wound is not quite as dry I do believe the Santyl has helped her improve to some degree here. Fortunately there is no signs of active infection at this time. I think we may be able to clean off the wound with a little bit of additional debridement today and likely we can transition away from the Santyl to some kind of collagen type dressing. 10/28/2019 upon evaluation today patient actually appears to be doing fairly well with regard to her wound today. She has been tolerating the dressing changes we did switch to collagen and  fortunately that seems to be doing well for her at this point. There does not seem to be any signs of active infection I do feel like the  surface of the wound is looking better at this time as well. 11/04/2019 upon evaluation today patient appears to be doing well with regard to her wound. She is making some progress here and the wound does appear to be measuring smaller which is good news. Overall this is good to be somewhat slow but nonetheless I am encouraged by the fact that the collagen does seem to have helped her. 11/11/2019 on evaluation today patient's wound is showing some signs of improvement. The good news is her overall trend does seem to be towards getting better compared to where she was previous. The smaller wounds along the side actually might even be healed but we can watch this 1 more week before I heal this out. Overall I feel like there is improvement here. She is still using the silver collagen. 11/18/2019 upon evaluation today patient appears to be doing about the same with regard to her wound. This is not significantly smaller there still appears to be a lot of inflammation. For that reason I do want to see about actually applying triamcinolone to the actual wound bed to see if this can be of benefit as well. I thought about this for couple weeks but I think it may be a good thing to proceed with at this point. 11/25/2019 on evaluation today patient appears to be doing well with regard to her lower extremity ulcer. She has been tolerating the dressing changes without complication. Fortunately there is no signs of active infection at this time. No fevers, chills, nausea, vomiting, or diarrhea. Electronic Signature(s) Signed: 11/25/2019 8:43:12 AM By: Worthy Keeler PA-C Entered By: Worthy Keeler on 11/25/2019 08:43:12 -------------------------------------------------------------------------------- Physical Exam Details Patient Name: Date of Service: [name], [name] 11/25/2019 8:00 AM Medical Record [id number] Patient Account Number: [id number] Date of Birth/Sex: Treating RN: Mar 28, 1961 (59 y.o. [name] Primary Care Provider: [name], Dibas Other Clinician: Referring Provider: Treating Provider/Extender:Stone III, Sharen Counter, Dibas Weeks in Treatment: 8 Constitutional Well-nourished and well-hydrated in no acute distress. Respiratory normal breathing without difficulty. Psychiatric this patient is able to make decisions and demonstrates good insight into disease process. Alert and Oriented x 3. pleasant and cooperative. Notes Patient's wound bed currently showed signs of good granulation at this time. Fortunately there is no evidence of active infection. No fevers, chills, nausea, vomiting, or diarrhea. I do feel like though this is making some progress is very slow I think we may benefit from looking into something along the lines of Apligraf to see if we could improve the overall quality of the wound bed and the epithelial tissue that is growing at this point. Apligraf is shown to both decrease time to heal as well as in the long run save money as far as insurance is concerned. It is win-win situation overall for both the patient and the Worker's [employer]. insurance carrier. I think based on the fact that the wound is very slowly progressing this could make a significant impact in the scenario as mentioned above. Electronic Signature(s) Signed: 11/25/2019 8:44:09 AM By: Worthy Keeler PA-C Entered By: Worthy Keeler on 11/25/2019 08:44:09 -------------------------------------------------------------------------------- Physician Orders Details Patient Name: Date of Service: [name], [name] 11/25/2019 8:00 AM Medical Record [id number] Patient Account Number: [id number] Date of Birth/Sex: Treating RN: [birth year] (59 y.o. 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 Dressing Change Frequency Wound #2 Left,Anterior Lower Leg Change Dressing every other day. Skin Barriers/Peri-Wound Care TCA Cream or Ointment - triamcinolone cream to periwound and thin layer over wound bed Wound Cleansing Wound #2 Left,Anterior Lower Leg May shower and wash wound with soap and water. - scrub gently with gauze Primary Wound Dressing Wound #2 Left,Anterior Lower Leg Silver Collagen - promogram prisma moistened with saline Secondary Dressing Wound #2 Left,Anterior Lower Leg Kerlix/Rolled Gauze Telfa (non adherent pad) Edema Control Avoid standing for long periods of time Elevate legs to the level of the heart or above for 30 minutes daily and/or when sitting, a frequency of: - throughout the day Exercise regularly Additional Orders / Instructions Other: - run IVR for apligraf Electronic Signature(s) Signed: 11/25/2019 5:14:16 PM By: Lenda Kelp PA-C Signed: 11/25/2019 5:36:50 PM By: Zenaida Deed RN, BSN Entered By: Zenaida Deed on 11/25/2019 08:37:58 -------------------------------------------------------------------------------- Problem List Details Patient Name: Date of Service: Cheyenne Aran B. 11/25/2019 8:00 AM Medical Record [id number] Patient Account Number: [id number] Date of Birth/Sex: Treating RN: 11-Jun-1961 (59 y.o. 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 11/25/2019 8:00 AM Medical Record [id number] Patient Account Number: [id number] Date of Birth/Sex: Treating RN: [birth year] (59 y.o. [name] Standard Primary Care Provider: Docia Chuck, Dibas Other Clinician: Referring Provider: Treating Provider/Extender:Stone III, Lizabeth Leyden, Dibas Weeks in Treatment: 8 Subjective Chief Complaint Information obtained from Patient Left LE Ulcers History of Present Illness (HPI) 09/30/2019 upon evaluation today patient presents for initial inspection here in our clinic concerning issues that she has been experiencing with a wound on her left anterior lower leg. She unfortunately had a significant laceration which required 19 sutures and she shows me the pictures both before as well as after sutured in a very good job was done in this regard. Unfortunately the skin did not take and necrosis. Subsequently this wound open and she has been having more significant issues with that since. Fortunately there is no evidence of active infection at this time which is good  news. No fever chills noted. She has a history of chronic venous stasis but is no longer able to wear any compression in fact she cannot even wear regular socks due to her neuropathy and the pain that  she experiences in her feet if she does. For that reason she tries to elevate her legs as much as she can at nighttime but again she really cannot wear any type of compression. With regard to antibiotic it does not appear that she has been on anything recently. 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 Fortunately there is no evidence of local or systemic infection. No fevers, chills, nausea, vomiting, or diarrhea. 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 10/07/2019 on evaluation today patient presents for follow-up concerning her wound on the left lower extremity. She has been tolerating the dressing changes without complication. With that being said she just got the Santyl 2 days ago. Obviously it has not had enough time to really do what we need to do she would like to obviously give this some time before proceeding with more aggressive debridement or otherwise. 10/21/2019 upon evaluation today patient appears to be doing better with regard to the overall appearance of her wound is not quite as dry I do believe the Santyl has helped her improve to some degree here. Fortunately there is no signs of active infection at this time. I think we may be able to clean off the wound with a little bit of additional debridement today and likely we can transition away from the Santyl to some kind of collagen type dressing. 10/28/2019 upon evaluation today patient actually appears to be doing fairly well with regard to her wound today. She has been tolerating the dressing  changes we did switch to collagen and fortunately that seems to be doing well for her at this point. There does not seem to be any signs of active infection I do feel like the surface of the wound is looking better at this time as well. 11/04/2019 upon evaluation today patient appears to be doing well with regard to her wound. She is making some progress here and the wound does appear to be measuring smaller which is good news. Overall this is good to be somewhat slow but nonetheless I am encouraged by the fact that the collagen does seem to have helped her. 11/11/2019 on evaluation today patient's wound is showing some signs of improvement. The good news is her overall trend does seem to be towards getting better compared to where she was previous. The smaller wounds along the side actually might even be healed but we can watch this 1 more week before I heal this out. Overall I feel like there is improvement here. She is still using the silver collagen. 11/18/2019 upon evaluation today patient appears to be doing about the same with regard to her wound. This is not significantly smaller there still appears to be a lot of inflammation. For that reason I do want to see about actually applying triamcinolone to the actual wound bed to see if this can be of benefit as well. I thought about this for couple weeks but I think it may be a good thing to proceed with at this point. 11/25/2019 on evaluation today patient appears to be doing well with regard to her lower extremity ulcer. She has been tolerating the dressing changes without complication. Fortunately there is no signs of  active infection at this time. No fevers, chills, nausea, vomiting, or diarrhea. Objective Constitutional Well-nourished and well-hydrated in no acute distress. Vitals Time Taken: 8:01 AM, Height: 66 in, Weight: 156 lbs, BMI: 25.2, Temperature: 97.7 F, Pulse: 73 bpm, Respiratory Rate: 18 breaths/min, Blood Pressure: 114/75  mmHg. Respiratory normal breathing without difficulty. Psychiatric this patient is able to make decisions and demonstrates good insight into disease process. Alert and Oriented x 3. pleasant and cooperative. General Notes: Patient's wound bed currently showed signs of good granulation at this time. Fortunately there is no evidence of active infection. No fevers, chills, nausea, vomiting, or diarrhea. I do feel like though this is making some progress is very slow I think we may benefit from looking into something along the lines of Apligraf to see if we could improve the overall quality of the wound bed and the epithelial tissue that is growing at this point. Apligraf is shown to both decrease time to heal as well as in the long run save money as far as insurance is concerned. It is win-win situation overall for both the patient and the Worker's Comp. insurance carrier. I think based on the fact that the wound is very slowly progressing this could make a significant impact in the scenario as mentioned above. Integumentary (Hair, Skin) Wound #2 status is Open. Original cause of wound was Trauma. The wound is located on the Left,Anterior Lower Leg. The wound measures 4.5cm length x 3.5cm width x 0.3cm depth; 12.37cm^2 area and 3.711cm^3 volume. There is Fat Layer (Subcutaneous Tissue) Exposed exposed. There is no tunneling or undermining noted. There is a medium amount of serosanguineous drainage noted. The wound margin is distinct with the outline attached to the wound base. There is medium (34-66%) pink granulation within the wound bed. There is a medium (34-66%) amount of necrotic tissue within the wound bed including Adherent Slough. 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 Dressing Change Frequency: Wound #2 Left,Anterior Lower Leg: Change Dressing every other day. Skin Barriers/Peri-Wound Care: TCA Cream or Ointment - triamcinolone cream to periwound and thin layer over wound bed Wound Cleansing: Wound #2 Left,Anterior Lower Leg: May shower and wash wound with soap and water. - scrub gently with gauze Primary Wound Dressing: Wound #2 Left,Anterior Lower Leg: Silver Collagen - promogram prisma moistened with saline Secondary Dressing: Wound #2 Left,Anterior Lower Leg: Kerlix/Rolled Gauze Telfa (non adherent pad) Edema Control: Avoid standing for long periods of time Elevate legs to the level of the heart or above for 30 minutes daily and/or when sitting, a frequency of: - throughout the day Exercise regularly Additional Orders / Instructions: Other: - run IVR for apligraf 1. My suggestion at this time is good to be that we go ahead and proceed with continuing with the silver collagen at this point. I do believe that subsequently the patient would benefit from Apligraf to try to speed up the healing process in this case. 2. I am to work on getting approval for the Apligraf through the [employer]. carrier obviously explained to the patient this could take some time. 3. I am also can recommend that we go ahead and continue with the triamcinolone cream to the periwound along with  a thin layer of the wound bed followed by the collagen as well moistened with saline I think in general she seems to be doing fairly well at this point. This is healing but just doing so slowly. We will see patient back for reevaluation in 1 week here in the clinic. If anything worsens or changes patient will contact our office for additional recommendations. Electronic Signature(s) Signed:  11/25/2019 8:45:04 AM By: Lenda KelpStone III, Crystal Ellwood PA-C Entered By: Lenda KelpStone III, Maximum Reiland on 11/25/2019 08:45:03 -------------------------------------------------------------------------------- SuperBill Details Patient Name: Date of Service: [name], Cheyenne B. 11/25/2019 Medical Record [id number] Patient Account Number: [id number] Date of Birth/Sex: Treating RN: Sep 16, 1960 (59 y.o. 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, unspecified Facility Procedures CPT4 Code: [id number] Description: 99213 - WOUND CARE VISIT-LEV 3 EST PT Modifier: Quantity: 1 Physician Procedures CPT4 Code Description: [id number] 99213 - WC PHYS LEVEL 3 - EST PT ICD-10 Diagnosis Description I87.2 Venous insufficiency (chronic) (peripheral) L97.822 Non-pressure chronic ulcer of other part of left lower le L97.828 Non-pressure chronic ulcer of other  part of left lower le severity 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

## 2019-11-25 NOTE — Progress Notes (Signed)
ELVIA, AYDIN ([id number]) Visit Report for 11/25/2019 Arrival Information Details Patient Name: Date of Service: [name], [name] 11/25/2019 8:00 AM Medical Record [id number] Patient Account Number: [id number] Date of Birth/Sex: Treating RN: May 12, 1961 (59 y.o. [name] Primary Care Thara Searing: [name], Dibas Other Clinician: Referring Gaby Harney: Treating Wallice Granville/Extender:Stone III, Sharen Counter, Dibas Weeks in Treatment: 8 Visit Information History Since Last Visit All ordered tests and consults were completed: No Patient Arrived: Ambulatory Added or deleted any medications: No Arrival Time: 08:01 Any new allergies or adverse reactions: No Accompanied By: self Had a fall or experienced change in No Transfer Assistance: None activities of daily living that may affect Patient Identification Verified: Yes risk of falls: Secondary Verification Process Yes Signs or symptoms of abuse/neglect since last No Completed: visito Patient Requires Transmission-Based No Hospitalized since last visit: No Precautions: Implantable device outside of the clinic excluding No Patient Has Alerts: Yes cellular tissue based products placed in the center since last visit: Has Dressing in Place as Prescribed: Yes Pain Present Now: No Electronic Signature(s) Signed: 11/25/2019 5:15:50 PM By: Carlene Coria RN Entered By: Carlene Coria on 11/25/2019 08:01:33 -------------------------------------------------------------------------------- Clinic Level of Care Assessment Details Patient Name: Date of Service: [name], [name] 11/25/2019 8:00 AM Medical Record [id number] Patient Account Number: [id number] Date of Birth/Sex: Treating RN: 1960-09-22 (59 y.o. [name] Primary Care Prabhav Faulkenberry: [name], Dibas Other Clinician: Referring Alayiah Fontes: Treating Lorriane Dehart/Extender:Stone III, Sharen Counter, Dibas Weeks in Treatment: 8 Clinic Level of Care Assessment Items TOOL 4  Quantity Score _0  - Use when only an EandM is performed on FOLLOW-UP visit 0 ASSESSMENTS - Nursing Assessment / Reassessment X - Reassessment of Co-morbidities (includes updates in patient status) 1 10 X - Reassessment of Adherence to Treatment Plan 1 5 ASSESSMENTS - Wound and Skin Assessment / Reassessment X - Simple Wound Assessment / Reassessment - one wound 1 5 _1  - Complex Wound Assessment / Reassessment - multiple wounds 0 _2  - Dermatologic / Skin Assessment (not related to wound area) 0 ASSESSMENTS - Focused Assessment X - Circumferential Edema Measurements - multi extremities 1 5 _3  - Nutritional Assessment / Counseling / Intervention 0 X - Lower Extremity Assessment (monofilament, tuning fork, pulses) 1 5 _4  - Peripheral Arterial Disease Assessment (using hand held doppler) 0 ASSESSMENTS - Ostomy and/or Continence Assessment and Care _5  - Incontinence Assessment and Management 0 _6  - Ostomy Care Assessment and Management (repouching, etc.) 0 PROCESS - Coordination of Care X - Simple Patient / Family Education for ongoing care 1 15 _7  - Complex (extensive) Patient / Family Education for ongoing care 0 X - Staff obtains Programmer, systems, Records, Test Results / Process Orders 1 10 _8  - Staff telephones HHA, Nursing Homes / Clarify orders / etc 0 _9  - Routine Transfer to another Facility (non-emergent condition) 0 _10  - Routine Hospital Admission (non-emergent condition) 0 _11  - New Admissions / Biomedical engineer / Ordering NPWT, Apligraf, etc. 0 _12  - Emergency Hospital Admission (emergent condition) 0 X - Simple Discharge Coordination 1 10 _13  - Complex (extensive) Discharge Coordination 0 PROCESS - Special Needs _14  - Pediatric / Minor Patient Management 0 _15  - Isolation Patient Management 0 _16  - Hearing / Language / Visual special needs 0 _17  - Assessment of Community assistance (transportation, D/C planning, etc.) 0 _18  - Additional assistance / Altered mentation 0 _19  - Support  Surface(s) Assessment (bed, cushion, seat, etc.) 0 INTERVENTIONS - Wound Cleansing / Measurement X - Simple Wound Cleansing - one wound 1 5 _20  - Complex  Wound Cleansing - multiple wounds 0 X - Wound Imaging (photographs - any number of wounds) 1 5 _0  - Wound Tracing (instead of photographs) 0 X - Simple Wound Measurement - one wound 1 5 _1  - Complex Wound Measurement - multiple wounds 0 INTERVENTIONS - Wound Dressings X - Small Wound Dressing one or multiple wounds 1 10 _2  - Medium Wound Dressing one or multiple wounds 0 _3  - Large Wound Dressing one or multiple wounds 0 X - Application of Medications - topical 1 5 <[id number]>_4<\/[id number]>_1  - Application of Medications - injection 0 INTERVENTIONS - Miscellaneous _5  - External ear exam 0 _6  - Specimen Collection (cultures, biopsies, blood, body fluids, etc.) 0 _7  - Specimen(s) / Culture(s) sent or taken to Lab for analysis 0 _8  - Patient Transfer (multiple staff / Civil Service fast streamer / Similar devices) 0 _9  - Simple Staple / Suture removal (25 or less) 0 _10  - Complex Staple / Suture removal (26 or more) 0 _11  - Hypo / Hyperglycemic Management (close monitor of Blood Glucose) 0 _12  - Ankle / Brachial Index (ABI) - do not check if billed separately 0 X - Vital Signs 1 5 Has the patient been seen at the hospital within the last three years: Yes Total Score: 100 Level Of Care: New/Established - Level 3 Electronic Signature(s) Signed: 11/25/2019 5:36:50 PM By: Baruch Gouty RN, BSN Entered By: Baruch Gouty on 11/25/2019 08:35:46 -------------------------------------------------------------------------------- Encounter Discharge Information Details Patient Name: Date of Service: Cheyenne Mccallum B. 11/25/2019 8:00 AM Medical Record [id number] Patient Account Number: [id number] Date of Birth/Sex: Treating RN: [birth year] (59 y.o. [name] Primary Care Alesha Jaffee: [name], Dibas Other Clinician: Referring Adalaya Irion: Treating Eshawn Coor/Extender:Stone III,  Sharen Counter, Dibas Weeks in Treatment: 8 Encounter Discharge Information Items Discharge Condition: Stable Ambulatory Status: Ambulatory Discharge Destination: Home Transportation: Private Auto Accompanied By: self Schedule Follow-up Appointment: Yes Clinical Summary of Care: Patient Declined Electronic Signature(s) Signed: 11/25/2019 5:15:50 PM By: Carlene Coria RN Entered By: Carlene Coria on 11/25/2019 08:55:04 -------------------------------------------------------------------------------- Lower Extremity Assessment Details Patient Name: Date of Service: [name], [name] 11/25/2019 8:00 AM Medical Record [id number] Patient Account Number: [id number] Date of Birth/Sex: Treating RN: November 23, 1960 (59 y.o. [name] Primary Care Charliene Inoue: [name], Dibas Other Clinician: Referring Sherri Mcarthy: Treating Mischa Brittingham/Extender:Stone III, Sharen Counter, Dibas Weeks in Treatment: 8 Edema Assessment Assessed: [Left: No] [Right: No] Edema: [Left: Ye] [Right: s] Calf Left: Right: Point of Measurement: 29 cm From Medial Instep 35 cm cm Ankle Left: Right: Point of Measurement: 8 cm From Medial Instep 20 cm cm Electronic Signature(s) Signed: 11/25/2019 5:15:50 PM By: Carlene Coria RN Entered By: Carlene Coria on 11/25/2019 08:02:15 -------------------------------------------------------------------------------- Multi-Disciplinary Care Plan Details Patient Name: Date of Service: Cheyenne Mccallum B. 11/25/2019 8:00 AM Medical Record [id number] Patient Account Number: [id number] Date of Birth/Sex: Treating RN: [birth year] (59 y.o. [name] Primary Care Dorance Spink: [name], Dibas Other Clinician: Referring Leonora Gores: Treating Kellina Dreese/Extender:Stone III, Sharen Counter, Dibas Weeks in Treatment: 8 Active Inactive Venous Leg Ulcer Nursing Diagnoses: Knowledge deficit related to disease process and management Potential for venous Insuffiency (use before diagnosis  confirmed) Goals: Patient will maintain optimal edema control Date Initiated: 09/30/2019 Target Resolution Date: 12/23/2019 Goal Status: Active Patient/caregiver will verbalize understanding of disease process and disease management Date Initiated: 09/30/2019 Date Inactivated: 11/25/2019 Target Resolution Date: 11/25/2019 Goal Status: Met Interventions: Assess peripheral edema status every visit. Compression as ordered Provide education on venous insufficiency Treatment Activities: Therapeutic compression applied : 09/30/2019 Notes: Wound/Skin Impairment Nursing Diagnoses: Impaired tissue integrity Knowledge deficit related  to ulceration/compromised skin integrity Goals: Patient/caregiver will verbalize understanding of skin care regimen Date Initiated: 09/30/2019 Target Resolution Date: 12/23/2019 Goal Status: Active Ulcer/skin breakdown will have a volume reduction of 30% by week 4 Date Initiated: 09/30/2019 Date Inactivated: 10/28/2019 Target Resolution Date: 10/28/2019 Unmet Reason: other Goal Status: Unmet comorbidities Interventions: Assess patient/caregiver ability to obtain necessary supplies Assess patient/caregiver ability to perform ulcer/skin care regimen upon admission and as needed Assess ulceration(s) every visit Provide education on ulcer and skin care Treatment Activities: Skin care regimen initiated : 09/30/2019 Topical wound management initiated : 09/30/2019 Notes: Electronic Signature(s) Signed: 11/25/2019 5:36:50 PM By: Baruch Gouty RN, BSN Entered By: Baruch Gouty on 11/25/2019 08:33:54 -------------------------------------------------------------------------------- Pain Assessment Details Patient Name: Date of Service: [name] 11/25/2019 8:00 AM Medical Record [id number] Patient Account Number: [id number] Date of Birth/Sex: Treating RN: 02-Aug-1961 (59 y.o. [name] Primary Care Marveline Profeta: [name], Dibas Other  Clinician: Referring Jailee Jaquez: Treating Sinan Tuch/Extender:Stone III, Sharen Counter, Dibas Weeks in Treatment: 8 Active Problems Location of Pain Severity and Description of Pain Patient Has Paino No Site Locations Pain Management and Medication Current Pain Management: Electronic Signature(s) Signed: 11/25/2019 5:15:50 PM By: Carlene Coria RN Entered By: Carlene Coria on 11/25/2019 08:02:05 -------------------------------------------------------------------------------- Patient/Caregiver Education Details Patient Name: Date of Service: [name], Cheyenne B. 3/24/2021andnbsp8:00 AM Medical Record [id number] Patient Account Number: [id number] Date of Birth/Gender: Treating RN: [birth year] (59 y.o. [name] Primary Care Physician: [name], Dibas Other Clinician: Referring Physician: Treating Physician/Extender:Stone III, Sharen Counter, Dibas Weeks in Treatment: 8 Education Assessment Education Provided To: Patient Education Topics Provided Venous: Methods: Explain/Verbal Responses: Reinforcements needed, State content correctly Wound/Skin Impairment: Methods: Explain/Verbal Responses: Reinforcements needed, State content correctly Electronic Signature(s) Signed: 11/25/2019 5:36:50 PM By: Baruch Gouty RN, BSN Entered By: Baruch Gouty on 11/25/2019 08:34:13 -------------------------------------------------------------------------------- Wound Assessment Details Patient Name: Date of Service: [name] 11/25/2019 8:00 AM Medical Record [id number] Patient Account Number: [id number] Date of Birth/Sex: Treating RN: Apr 19, 1961 (59 y.o. [name] Primary Care Caraline Deutschman: [name], Dibas Other Clinician: Referring Javana Schey: Treating Ersa Delaney/Extender:Stone III, Sharen Counter, Dibas Weeks in Treatment: 8 Wound Status Wound Number: 2 Primary Venous Leg Ulcer Etiology: Wound Location: Left, Anterior Lower Leg Wound Status: Open Wounding Event:  Trauma Comorbid Lymphedema, Raynauds, Date Acquired: 08/06/2019 History: Neuropathy Weeks Of Treatment: 8 Clustered Wound: No Wound Measurements Length: (cm) 4.5 % Reduct Width: (cm) 3.5 % Reduct Depth: (cm) 0.3 Epitheli Area: (cm) 12.37 Tunneli Volume: (cm) 3.711 Undermi Wound Description Full Thickness Without Exposed Support Foul Od Classification: Structures Slough/ Wound Distinct, outline attached Margin: Exudate Medium Amount: Exudate Serosanguineous Type: Exudate red, brown Color: Wound Bed Granulation Amount: Medium (34-66%) Granulation Quality: Pink Fascia E Necrotic Amount: Medium (34-66%) Fat Laye Necrotic Quality: Adherent Slough Tendon E Muscle E Joint Ex Bone Exp or After Cleansing: No Fibrino Yes Exposed Structure xposed: No r (Subcutaneous Tissue) Exposed: Yes xposed: No xposed: No posed: No osed: No ion in Area: 32.2% ion in Volume: -1.8% alization: Small (1-33%) ng: No ning: No Treatment Notes Wound #2 (Left, Anterior Lower Leg) 1. Cleanse With Wound Cleanser 2. Periwound Care TCA Ointment 3. Primary Dressing Applied Collegen [employer] Notes secondary telfa pad. Electronic Signature(s) Signed: 11/25/2019 5:15:50 PM By: Carlene Coria RN Entered By: Carlene Coria on 11/25/2019 08:02:48 -------------------------------------------------------------------------------- Vitals Details Patient Name: Date of Service: Cheyenne Mccallum B. 11/25/2019 8:00 AM Medical Record [id number] Patient Account Number: [id number] Date of Birth/Sex: Treating RN: 09-10-1960 (59 y.o. [name] Primary Care Ashlinn Hemrick: [name], Dibas Other Clinician: Referring  Kemaya Dorner: Treating Isador Castille/Extender:Stone III, Sharen Counter, Dibas Weeks in Treatment: 8 Vital Signs Time Taken: 08:01 Temperature (F): 97.7 Height (in): 66 Pulse (bpm): 73 Weight (lbs): 156 Respiratory Rate (breaths/min): 18 Body Mass Index (BMI): 25.2 Blood Pressure (mmHg):  114/75 Reference Range: 80 - 120 mg / dl Electronic Signature(s) Signed: 11/25/2019 5:15:50 PM By: Carlene Coria RN Entered By: Carlene Coria on 11/25/2019 08:01:58

## 2019-12-02 ENCOUNTER — Other Ambulatory Visit: Payer: Self-pay

## 2019-12-02 ENCOUNTER — Encounter (HOSPITAL_BASED_OUTPATIENT_CLINIC_OR_DEPARTMENT_OTHER): Payer: Worker's Compensation | Admitting: Physician Assistant

## 2019-12-02 DIAGNOSIS — I872 Venous insufficiency (chronic) (peripheral): Secondary | ICD-10-CM | POA: Diagnosis not present

## 2019-12-02 NOTE — Progress Notes (Addendum)
[name], [name] ([id number]) Visit Report for 12/02/2019 Chief Complaint Document Details Patient Name: Date of Service: ZULEICA, SEITH 12/02/2019 8:00 AM Medical Record [id number] Patient Account Number: [id number] Date of Birth/Sex: Treating RN: 1961/02/24 (59 y.o. [name] Standard Primary Care Provider: Docia Chuck, Dibas Other Clinician: Referring Provider: Treating Provider/Extender:Stone III, Lizabeth Leyden, Dibas Weeks in Treatment: 9 Information Obtained from: Patient Chief Complaint Left LE Ulcers Electronic Signature(s) Signed: 12/02/2019 8:00:12 AM By: Lenda Kelp PA-C Entered By: Lenda Kelp on 12/02/2019 08:00:12 -------------------------------------------------------------------------------- HPI Details Patient Name: Date of Service: Cheyenne Aran B. 12/02/2019 8:00 AM Medical Record [id number] Patient Account Number: [id number] Date of Birth/Sex: Treating RN: October 11, 1960 (59 y.o. [name] Standard Primary Care Provider: Docia Chuck, Dibas Other Clinician: Referring Provider: Treating Provider/Extender:Stone III, Lizabeth Leyden, Dibas Weeks in Treatment: 9 History of Present Illness HPI Description: 09/30/2019 upon evaluation today patient presents for initial inspection here in our clinic concerning issues that she has been experiencing with a wound on her left anterior lower leg. She unfortunately had a significant laceration which required 19 sutures and she shows me the pictures both before as well as after sutured in a very good job was done in this regard. Unfortunately the skin did not take and necrosis. Subsequently this wound open and she has been having more significant issues with that since. Fortunately there is no evidence of active infection at this time which is good news. No fever chills noted. She has a history of chronic venous stasis but is no longer able to wear any compression in fact she cannot even wear regular socks due to her  neuropathy and the pain that she experiences in her feet if she does. For that reason she tries to elevate her legs as much as she can at nighttime but again she really cannot wear any type of compression. With regard to antibiotic it does not appear that she has been on anything recently. 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 Fortunately there is no evidence of local or systemic infection. No fevers, chills, nausea, vomiting, or diarrhea. 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 10/07/2019 on evaluation today patient presents for follow-up concerning her wound on the left lower extremity. She has been tolerating the dressing changes without complication. With that being said she just got the Santyl 2 days ago. Obviously it has not had enough time to really do what we need to do she would like to obviously give this some time before proceeding with more aggressive debridement or otherwise. 10/21/2019 upon evaluation today patient appears to be doing better with regard to the overall appearance of her wound is not quite as dry I do believe the Santyl has helped her improve to some degree here. Fortunately there is no signs of active infection at this time. I think we may be able to clean off the wound with a little bit of additional debridement today and likely we can transition away from the Santyl to some kind of collagen type dressing. 10/28/2019 upon evaluation today patient actually appears to be doing fairly well with regard to her wound today. She has been tolerating the dressing changes we did switch to collagen and  fortunately that seems to be doing well for her at this point. There does not seem to be any signs of active infection I do feel like the  surface of the wound is looking better at this time as well. 11/04/2019 upon evaluation today patient appears to be doing well with regard to her wound. She is making some progress here and the wound does appear to be measuring smaller which is good news. Overall this is good to be somewhat slow but nonetheless I am encouraged by the fact that the collagen does seem to have helped her. 11/11/2019 on evaluation today patient's wound is showing some signs of improvement. The good news is her overall trend does seem to be towards getting better compared to where she was previous. The smaller wounds along the side actually might even be healed but we can watch this 1 more week before I heal this out. Overall I feel like there is improvement here. She is still using the silver collagen. 11/18/2019 upon evaluation today patient appears to be doing about the same with regard to her wound. This is not significantly smaller there still appears to be a lot of inflammation. For that reason I do want to see about actually applying triamcinolone to the actual wound bed to see if this can be of benefit as well. I thought about this for couple weeks but I think it may be a good thing to proceed with at this point. 11/25/2019 on evaluation today patient appears to be doing well with regard to her lower extremity ulcer. She has been tolerating the dressing changes without complication. Fortunately there is no signs of active infection at this time. No fevers, chills, nausea, vomiting, or diarrhea. 12/02/2019 on evaluation today patient appears to be doing okay with regard to her ulcer on the leg. Fortunately there is no signs of active infection at this time. No fever chills noted she has been tolerating the dressing changes without complication. With that being said overall I am pleased with the fact that there is no signs of infection but at the same time I really feel like she needs more to progress moving week to  week and right now were really not seeing that. We have put in for approval for Apligraf which I think could help to move things along. With that being said we have not heard anything regarding approval at this point. Electronic Signature(s) Signed: 12/02/2019 8:27:28 AM By: Lenda Kelp PA-C Entered By: Lenda Kelp on 12/02/2019 13:24:40 -------------------------------------------------------------------------------- Physical Exam Details Patient Name: Date of Service: [name], [name] 12/02/2019 8:00 AM Medical Record [id number] Patient Account Number: [id number] Date of Birth/Sex: Treating RN: February 15, 1961 (59 y.o. [name] Standard Primary Care Provider: Docia Chuck, Dibas Other Clinician: Referring Provider: Treating Provider/Extender:Stone III, Lizabeth Leyden, Dibas Weeks in Treatment: 9 Constitutional Well-nourished and well-hydrated in no acute distress. Respiratory normal breathing without difficulty. Psychiatric this patient is able to make decisions and demonstrates good insight into disease process. Alert and Oriented x 3. pleasant and cooperative. Notes Upon inspection today patient's wound bed actually showed signs of good granulation at this time. Fortunately there is no signs of active infection which is also good news. I really think however we need something to stimulate this wound to get moving as far as new tissue growth is concerned. Electronic Signature(s) Signed: 12/02/2019 8:28:35 AM By: Lenda Kelp PA-C Entered By: Lenda Kelp on 12/02/2019 08:28:34 -------------------------------------------------------------------------------- Physician Orders Details Patient Name: Date of Service: [name],  Cheyenne B. 12/02/2019 8:00 AM Medical Record [id number] Patient Account Number: [id number] Date of Birth/Sex: Treating RN: 02/13/1961 (59 y.o. 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 Dressing Change Frequency Wound #2 Left,Anterior Lower Leg Change Dressing every other day. Skin Barriers/Peri-Wound Care TCA Cream or Ointment - triamcinolone cream to periwound and thin layer over wound bed Wound Cleansing Wound #2 Left,Anterior Lower Leg May shower and wash wound with soap and water. - scrub gently with gauze Primary Wound Dressing Wound #2 Left,Anterior Lower Leg Silver Collagen - promogram prisma moistened with saline Secondary Dressing Wound #2 Left,Anterior Lower Leg Kerlix/Rolled Gauze Telfa (non adherent pad) Edema Control Avoid standing for long periods of time Elevate legs to the level of the heart or above for 30 minutes daily and/or when sitting, a frequency of: - throughout the day Exercise regularly Additional Orders / Instructions Other: - run IVR for apligraf Electronic Signature(s) Signed: 12/02/2019 6:50:57 PM By: Zenaida Deed RN, BSN Signed: 12/02/2019 10:08:08 PM By: Lenda Kelp PA-C Entered By: Zenaida Deed on 12/02/2019 08:24:51 -------------------------------------------------------------------------------- Problem List Details Patient Name: Date of Service: Cheyenne Aran B. 12/02/2019 8:00 AM Medical Record [id number] Patient Account Number: [id number] Date of Birth/Sex: Treating RN: [birth year] (59 y.o. 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 12/02/2019 8:00 AM Medical Record [id number] Patient Account Number: [id number] Date of Birth/Sex: Treating RN: Feb 10, 1961 (59 y.o. [name] Standard Primary Care Provider: Docia Chuck, Dibas Other Clinician: Referring Provider: Treating Provider/Extender:Stone III, Lizabeth Leyden, Dibas Weeks in Treatment: 9 Subjective Chief Complaint Information obtained from Patient Left LE Ulcers History of Present Illness (HPI) 09/30/2019 upon evaluation today patient presents for initial inspection here in our clinic concerning issues that she has been experiencing with a wound on her left anterior lower leg. She unfortunately had a significant laceration which required 19 sutures and she shows me the pictures both before as well as after sutured in a very good job was done in this regard. Unfortunately the skin did not take and necrosis. Subsequently this wound open and  she has been having more significant issues with that since. Fortunately there is no evidence of active infection at this time which is good news. No fever chills noted. She has a history of chronic venous  stasis but is no longer able to wear any compression in fact she cannot even wear regular socks due to her neuropathy and the pain that she experiences in her feet if she does. For that reason she tries to elevate her legs as much as she can at nighttime but again she really cannot wear any type of compression. With regard to antibiotic it does not appear that she has been on anything recently. 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 Fortunately there is no evidence of local or systemic infection. No fevers, chills, nausea, vomiting, or diarrhea. 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 10/07/2019 on evaluation today patient presents for follow-up concerning her wound on the left lower extremity. She has been tolerating the dressing changes without complication. With that being said she just got the Santyl 2 days ago. Obviously it has not had enough time to really do what we need to do she would like to obviously give this some time before proceeding with more aggressive debridement or otherwise. 10/21/2019 upon evaluation today patient appears to be doing better with regard to the overall appearance of her wound is not quite as dry I do believe the Santyl has helped her improve to some degree here. Fortunately there is no signs of active infection at this time. I think we may be able to clean off the wound with a little bit of additional debridement today and likely we can transition away from the Santyl to some kind of collagen type dressing. 10/28/2019  upon evaluation today patient actually appears to be doing fairly well with regard to her wound today. She has been tolerating the dressing changes we did switch to collagen and fortunately that seems to be doing well for her at this point. There does not seem to be any signs of active infection I do feel like the surface of the wound is looking better at this time as well. 11/04/2019 upon evaluation today patient appears to be doing well with regard to her wound. She is making some progress here and the wound does appear to be measuring smaller which is good news. Overall this is good to be somewhat slow but nonetheless I am encouraged by the fact that the collagen does seem to have helped her. 11/11/2019 on evaluation today patient's wound is showing some signs of improvement. The good news is her overall trend does seem to be towards getting better compared to where she was previous. The smaller wounds along the side actually might even be healed but we can watch this 1 more week before I heal this out. Overall I feel like there is improvement here. She is still using the silver collagen. 11/18/2019 upon evaluation today patient appears to be doing about the same with regard to her wound. This is not significantly smaller there still appears to be a lot of inflammation. For that reason I do want to see about actually applying triamcinolone to the actual wound bed to see if this can be of benefit as well. I thought about this for couple weeks but I think it may be a good thing to proceed with at this point. 11/25/2019 on evaluation today patient appears  to be doing well with regard to her lower extremity ulcer. She has been tolerating the dressing changes without complication. Fortunately there is no signs of active infection at this time. No fevers, chills, nausea, vomiting, or diarrhea. 12/02/2019 on evaluation today patient appears to be doing okay with regard to her ulcer on the leg. Fortunately  there is no signs of active infection at this time. No fever chills noted she has been tolerating the dressing changes without complication. With that being said overall I am pleased with the fact that there is no signs of infection but at the same time I really feel like she needs more to progress moving week to week and right now were really not seeing that. We have put in for approval for Apligraf which I think could help to move things along. With that being said we have not heard anything regarding approval at this point. Objective Constitutional Well-nourished and well-hydrated in no acute distress. Vitals Time Taken: 7:59 AM, Height: 66 in, Weight: 156 lbs, BMI: 25.2, Temperature: 98.1 F, Pulse: 75 bpm, Respiratory Rate: 18 breaths/min, Blood Pressure: 126/78 mmHg. Respiratory normal breathing without difficulty. Psychiatric this patient is able to make decisions and demonstrates good insight into disease process. Alert and Oriented x 3. pleasant and cooperative. General Notes: Upon inspection today patient's wound bed actually showed signs of good granulation at this time. Fortunately there is no signs of active infection which is also good news. I really think however we need something to stimulate this wound to get moving as far as new tissue growth is concerned. Integumentary (Hair, Skin) Wound #2 status is Open. Original cause of wound was Trauma. The wound is located on the Left,Anterior Lower Leg. The wound measures 5cm length x 3.6cm width x 0.3cm depth; 14.137cm^2 area and 4.241cm^3 volume. There is Fat Layer (Subcutaneous Tissue) Exposed exposed. There is no tunneling or undermining noted. There is a medium amount of serosanguineous drainage noted. The wound margin is distinct with the outline attached to the wound base. There is small (1-33%) pink granulation within the wound bed. There is a large (67-100%) amount of necrotic tissue within the wound bed including Adherent  Slough. 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 Dressing Change Frequency: Wound #2 Left,Anterior Lower Leg: Change Dressing every other day. Skin Barriers/Peri-Wound Care: TCA Cream or Ointment - triamcinolone cream to periwound and thin layer over wound bed Wound Cleansing: Wound #2 Left,Anterior Lower Leg: May shower and wash wound with soap and water. - scrub gently with gauze Primary Wound Dressing: Wound #2 Left,Anterior Lower Leg: Silver Collagen - promogram prisma moistened with saline Secondary Dressing: Wound #2 Left,Anterior Lower Leg: Kerlix/Rolled Gauze Telfa (non adherent pad) Edema Control: Avoid standing for long periods of time Elevate legs to the level of the heart or above for 30 minutes daily and/or when sitting, a frequency of: - throughout the day Exercise regularly Additional Orders / Instructions: Other: - run IVR for apligraf 1. My suggestion at this time is good to be that we go ahead and continue with the collagen dressing as I still think that is probably the best thing to do at this point. With that being said I really feel  like the Apligraf would be better we just need to getting approval. 2. I am also can recommend at this time that we have the patient continue to elevate her legs is much as possible we have not been able to really wrap her legs. 3. I am also going to recommend at this point that we have her to continue to monitor for any signs of infection I do not see anything of such right now. 4. The good news is we did have her check a Steri-Strip over  the past week to see if there was anything that would cause a reaction here for her she did very well with this. We will see patient back for reevaluation in 1 week here in the clinic. If anything worsens or changes patient will contact our office for additional recommendations. Electronic Signature(s) Signed: 12/02/2019 8:34:10 AM By: Lenda Kelp PA-C Entered By: Lenda Kelp on 12/02/2019 08:34:10 -------------------------------------------------------------------------------- SuperBill Details Patient Name: Date of Service: [name] 12/02/2019 Medical Record [id number] Patient Account Number: [id number] Date of Birth/Sex: Treating RN: 1961/04/15 (59 y.o. 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, unspecified Facility Procedures CPT4 Code: [id number] Description: 99213 - WOUND CARE VISIT-LEV 3 EST PT Modifier: Quantity: 1 Physician Procedures CPT4 Code Description: [id number] - WC PHYS LEVEL 3 - EST PT ICD-10 Diagnosis Description I87.2 Venous insufficiency (chronic) (peripheral) L97.822 Non-pressure chronic ulcer of other part of left lower l L97.828 Non-pressure chronic ulcer of other  part of left lower l severity 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

## 2019-12-09 ENCOUNTER — Other Ambulatory Visit: Payer: Self-pay | Admitting: Neurology

## 2019-12-10 ENCOUNTER — Other Ambulatory Visit: Payer: Self-pay

## 2019-12-10 ENCOUNTER — Encounter (HOSPITAL_BASED_OUTPATIENT_CLINIC_OR_DEPARTMENT_OTHER): Payer: Worker's Compensation | Attending: Internal Medicine | Admitting: Internal Medicine

## 2019-12-10 DIAGNOSIS — Z881 Allergy status to other antibiotic agents status: Secondary | ICD-10-CM | POA: Diagnosis not present

## 2019-12-10 DIAGNOSIS — Z886 Allergy status to analgesic agent status: Secondary | ICD-10-CM | POA: Diagnosis not present

## 2019-12-10 DIAGNOSIS — I7300 Raynaud's syndrome without gangrene: Secondary | ICD-10-CM | POA: Insufficient documentation

## 2019-12-10 DIAGNOSIS — M3501 Sicca syndrome with keratoconjunctivitis: Secondary | ICD-10-CM | POA: Insufficient documentation

## 2019-12-10 DIAGNOSIS — G609 Hereditary and idiopathic neuropathy, unspecified: Secondary | ICD-10-CM | POA: Diagnosis not present

## 2019-12-10 DIAGNOSIS — L03116 Cellulitis of left lower limb: Secondary | ICD-10-CM | POA: Insufficient documentation

## 2019-12-10 DIAGNOSIS — I872 Venous insufficiency (chronic) (peripheral): Secondary | ICD-10-CM | POA: Insufficient documentation

## 2019-12-10 DIAGNOSIS — L97822 Non-pressure chronic ulcer of other part of left lower leg with fat layer exposed: Secondary | ICD-10-CM | POA: Insufficient documentation

## 2019-12-10 DIAGNOSIS — Z91048 Other nonmedicinal substance allergy status: Secondary | ICD-10-CM | POA: Diagnosis not present

## 2019-12-10 NOTE — Progress Notes (Signed)
[name], [name] ([id number]) Visit Report for 12/10/2019 Debridement Details Patient Name: Date of Service: [name], [name] 12/10/2019 8:00 AM Medical Record [id number] Patient Account Number: [id number] Date of Birth/Sex: Treating RN: 06-01-1961 (59 y.o. [name] Primary Care Provider: [name], Dibas Other Clinician: Referring Provider: Treating Provider/Extender:Meaghan Whistler, Rito Ehrlich, Dibas Weeks in Treatment: 10 Debridement Performed for Wound #2 Left,Anterior Lower Leg Assessment: Performed By: Physician Ricard Dillon., MD Debridement Type: Debridement Severity of Tissue Pre Fat layer exposed Debridement: Level of Consciousness (Pre- Awake and Alert procedure): Pre-procedure Verification/Time Out Taken: Yes - 08:19 Start Time: 08:20 Pain Control: Lidocaine 4% Topical Solution Total Area Debrided (L x W): 4.8 (cm) x 3.6 (cm) = 17.28 (cm) Tissue and other material Viable, Non-Viable, Slough, Subcutaneous, Skin: Dermis , Fibrin/Exudate, Slough debrided: Level: Skin/Subcutaneous Tissue Debridement Description: Excisional Instrument: Curette Bleeding: Minimum Hemostasis Achieved: Pressure End Time: 08:26 Procedural Pain: 0 Post Procedural Pain: 0 Response to Treatment: Procedure was tolerated well Level of Consciousness Awake and Alert (Post-procedure): Post Debridement Measurements of Total Wound Length: (cm) 4.8 Width: (cm) 3.6 Depth: (cm) 0.3 Volume: (cm) 4.072 Character of Wound/Ulcer Post Improved Debridement: Severity of Tissue Post Debridement: Fat layer exposed Post Procedure Diagnosis Same as Pre-procedure Electronic Signature(s) Signed: 12/10/2019 5:58:56 PM By: Linton Ham MD Signed: 12/10/2019 6:08:37 PM By: Deon Pilling Entered By: Linton Ham on 12/10/2019 08:43:04 -------------------------------------------------------------------------------- HPI Details Patient Name: Date of Service: Cheyenne Mccallum B. 12/10/2019 8:00  AM Medical Record [id number] Patient Account Number: [id number] Date of Birth/Sex: Treating RN: [birth year] (59 y.o. [name] Primary Care Provider: [name], Dibas Other Clinician: Referring Provider: Treating Provider/Extender:Shanie Mauzy, Rito Ehrlich, Dibas Weeks in Treatment: 10 History of Present Illness HPI Description: 09/30/2019 upon evaluation today patient presents for initial inspection here in our clinic concerning issues that she has been experiencing with a wound on her left anterior lower leg. She unfortunately had a significant laceration which required 19 sutures and she shows me the pictures both before as well as after sutured in a very good job was done in this regard. Unfortunately the skin did not take and necrosis. Subsequently this wound open and she has been having more significant issues with that since. Fortunately there is no evidence of active infection at this time which is good news. No fever chills noted. She has a history of chronic venous stasis but is no longer able to wear any compression in fact she cannot even wear regular socks due to her neuropathy and the pain that she experiences in her feet if she does. For that reason she tries to elevate her legs as much as she can at nighttime but again she really cannot wear any type of compression. With regard to antibiotic it does not appear that she has been on anything recently. 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 Fortunately there is no evidence of local or systemic infection. No fevers, chills, nausea, vomiting, or diarrhea. 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 10/07/2019 on evaluation today patient presents for follow-up concerning her wound on the  left lower extremity. She has been tolerating the dressing changes without complication. With that being said she just got the Santyl 2 days  ago. Obviously it has not had enough time to really do what we need to do she would like to obviously give this some time before proceeding with more aggressive debridement or otherwise. 10/21/2019 upon evaluation today patient appears to be doing better with regard to the overall appearance of her wound is not quite as dry I do believe the Santyl has helped her improve to some degree here. Fortunately there is no signs of active infection at this time. I think we may be able to clean off the wound with a little bit of additional debridement today and likely we can transition away from the Santyl to some kind of collagen type dressing. 10/28/2019 upon evaluation today patient actually appears to be doing fairly well with regard to her wound today. She has been tolerating the dressing changes we did switch to collagen and fortunately that seems to be doing well for her at this point. There does not seem to be any signs of active infection I do feel like the surface of the wound is looking better at this time as well. 11/04/2019 upon evaluation today patient appears to be doing well with regard to her wound. She is making some progress here and the wound does appear to be measuring smaller which is good news. Overall this is good to be somewhat slow but nonetheless I am encouraged by the fact that the collagen does seem to have helped her. 11/11/2019 on evaluation today patient's wound is showing some signs of improvement. The good news is her overall trend does seem to be towards getting better compared to where she was previous. The smaller wounds along the side actually might even be healed but we can watch this 1 more week before I heal this out. Overall I feel like there is improvement here. She is still using the silver collagen. 11/18/2019 upon evaluation  today patient appears to be doing about the same with regard to her wound. This is not significantly smaller there still appears to be a lot of inflammation. For that reason I do want to see about actually applying triamcinolone to the actual wound bed to see if this can be of benefit as well. I thought about this for couple weeks but I think it may be a good thing to proceed with at this point. 11/25/2019 on evaluation today patient appears to be doing well with regard to her lower extremity ulcer. She has been tolerating the dressing changes without complication. Fortunately there is no signs of active infection at this time. No fevers, chills, nausea, vomiting, or diarrhea. 12/02/2019 on evaluation today patient appears to be doing okay with regard to her ulcer on the leg. Fortunately there is no signs of active infection at this time. No fever chills noted she has been tolerating the dressing changes without complication. With that being said overall I am pleased with the fact that there is no signs of infection but at the same time I really feel like she needs more to progress moving week to week and right now were really not seeing that. We have put in for approval for Apligraf which I think could help to move things along. With that being said we have not heard anything regarding approval at this point. 12/10/2019; patient comes in on my day although have not seen her previously. She has what was a traumatic wound on the left anterior mid tibia. Initially sutured however the area never really healed according to the patient. She has been  using Santyl now using Prisma. There is exposed tendon. We have made application for Apligraf apparently this is going through [employer] still has not been approved. She arrives in clinic with erythema around the wound and 4 small circular papules superior to the wound. These are nontender and she is not complaining of any more pain. The exact cause  of this is not really clear. The patient has idiopathic peripheral neuropathy which for which she has been extensively evaluated in the past she is not a diabetic she does not have any rheumatologic problems she is aware of. She cannot wear compression because it aggravates her neuropathy Electronic Signature(s) Signed: 12/10/2019 5:58:56 PM By: Baltazar Najjar MD Entered By: Baltazar Najjar on 12/10/2019 08:45:26 -------------------------------------------------------------------------------- Physical Exam Details Patient Name: Date of Service: Cheyenne Aran B. 12/10/2019 8:00 AM Medical Record [id number] Patient Account Number: [id number] Date of Birth/Sex: Treating RN: [birth year] (59 y.o. [name] Primary Care Provider: Docia Chuck, Dibas Other Clinician: Referring Provider: Treating Provider/Extender:Kimberlea Schlag, Effie Shy, Dibas Weeks in Treatment: 10 Constitutional Sitting or standing Blood Pressure is within target range for patient.. Pulse regular and within target range for patient.Marland Kitchen Respirations regular, non-labored and within target range.. Temperature is normal and within the target range for the patient.Marland Kitchen Appears in no distress. Notes Wound exam; the patient's wound is reasonably extensive on the left anterior tibia. Exposed tendon. Using a #3 curette debridement of very adherent necrotic debris over most of the wound surface. Post debridement things actually look fairly healthy. She has obvious chronic venous insufficiency. Her pedal pulses are palpable. She has some erythema around the wound but no tenderness. Above this she has 4 small circular papules in a cluster there is no tenderness in this area. I am not exactly sure what is causing this Electronic Signature(s) Signed: 12/10/2019 5:58:56 PM By: Baltazar Najjar MD Entered By: Baltazar Najjar on 12/10/2019 08:47:07 -------------------------------------------------------------------------------- Physician  Orders Details Patient Name: Date of Service: Cheyenne Aran B. 12/10/2019 8:00 AM Medical Record [id number] Patient Account Number: [id number] Date of Birth/Sex: Treating RN: 1960-12-03 (59 y.o. 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 - Wednesday with Leonard Schwartz. Dressing Change Frequency Wound #2 Left,Anterior Lower Leg Change Dressing every other day. Skin Barriers/Peri-Wound Care TCA Cream or Ointment - triamcinolone cream to periwound and thin layer over wound bed. Wound Cleansing Wound #2 Left,Anterior Lower Leg May shower and wash wound with soap and water. 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 09/30/2019 No Yes I73.00 Raynaud's syndrome without gangrene 09/30/2019 No Yes 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 [name] Primary Care Provider: Docia Chuck, Dibas Other Clinician: Referring Provider: Treating Provider/Extender:Salayah Meares, Effie Shy, Dibas Weeks in Treatment: 10 Subjective History of Present Illness (HPI) 09/30/2019 upon evaluation today patient presents for initial inspection here in our clinic concerning issues that she has been experiencing with a wound on her left anterior lower leg. She  unfortunately had a significant laceration which required 19 sutures and she shows me the pictures both before as well as after sutured in a very good job was done in this regard. Unfortunately the skin did not take and necrosis. Subsequently this wound open and she has been having more significant issues with that since. Fortunately there is no evidence of active infection at this time which is good news. No fever chills noted. She has a history of chronic venous stasis but is no longer able to wear any compression in fact she cannot even wear regular socks due to her neuropathy and the pain that she experiences in her feet if she does. For that reason she tries to elevate her legs as much as she can at nighttime but again she really cannot wear any type of compression. With regard to antibiotic it does not appear that she has been on anything recently. 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 Fortunately there is no evidence of local or systemic infection. No fevers, chills, nausea, vomiting, or diarrhea. 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 10/07/2019 on evaluation today patient presents for follow-up concerning her wound on the left lower extremity. She has been tolerating the dressing changes without complication. With that being said she just got the Santyl 2 days ago. Obviously it has not had enough time to really do what we need to do she would like to obviously give this some time before proceeding with more aggressive debridement or otherwise. 10/21/2019 upon evaluation today patient appears to be doing better with regard to the overall appearance of her wound is not quite as dry I do believe the Santyl has helped her improve to some degree here.  Fortunately there is no signs of active infection at this time. I think  we may be able to clean off the wound with a little bit of additional debridement today and likely we can transition away from the Santyl to some kind of collagen type dressing. 10/28/2019 upon evaluation today patient actually appears to be doing fairly well with regard to her wound today. She has been tolerating the dressing changes we did switch to collagen and fortunately that seems to be doing well for her at this point. There does not seem to be any signs of active infection I do feel like the surface of the wound is looking better at this time as well. 11/04/2019 upon evaluation today patient appears to be doing well with regard to her wound. She is making some progress here and the wound does appear to be measuring smaller which is good news. Overall this is good to be somewhat slow but nonetheless I am encouraged by the fact that the collagen does seem to have helped her. 11/11/2019 on evaluation today patient's wound is showing some signs of improvement. The good news is her overall trend does seem to be towards getting better compared to where she was previous. The smaller wounds along the side actually might even be healed but we can watch this 1 more week before I heal this out. Overall I feel like there is improvement here. She is still using the silver collagen. 11/18/2019 upon evaluation today patient appears to be doing about the same with regard to her wound. This is not significantly smaller there still appears to be a lot of inflammation. For that reason I do want to see about actually applying triamcinolone to the actual wound bed to see if this can be of benefit as well. I thought about this for couple weeks but I think it may be a good thing to proceed with at this point. 11/25/2019 on evaluation today patient appears to be doing well with regard to her lower extremity ulcer. She has been tolerating the  dressing changes without complication. Fortunately there is no signs of active infection at this time. No fevers, chills, nausea, vomiting, or diarrhea. 12/02/2019 on evaluation today patient appears to be doing okay with regard to her ulcer on the leg. Fortunately there is no signs of active infection at this time. No fever chills noted she has been tolerating the dressing changes without complication. With that being said overall I am pleased with the fact that there is no signs of infection but at the same time I really feel like she needs more to progress moving week to week and right now were really not seeing that. We have put in for approval for Apligraf which I think could help to move things along. With that being said we have not heard anything regarding approval at this point. 12/10/2019; patient comes in on my day although have not seen her previously. She has what was a traumatic wound on the left anterior mid tibia. Initially sutured however the area never really healed according to the patient. She has been using Santyl now using Prisma. There is exposed tendon. We have made application for Apligraf apparently this is going through [employer] still has not been approved. She arrives in clinic with erythema around the wound and 4 small circular papules superior to the wound. These are nontender and she is not complaining of any more pain. The exact cause of this is not really clear. The patient has idiopathic peripheral neuropathy which for which she has been extensively evaluated in the past  she is not a diabetic she does not have any rheumatologic problems she is aware of. She cannot wear compression because it aggravates her neuropathy Objective Constitutional Sitting or standing Blood Pressure is within target range for patient.. Pulse regular and within target range for patient.Marland Kitchen Respirations regular, non-labored and within target range.. Temperature is normal and  within the target range for the patient.Marland Kitchen Appears in no distress. Vitals Time Taken: 8:08 AM, Height: 66 in, Source: Stated, Weight: 156 lbs, Source: Stated, BMI: 25.2, Temperature: 97.8 F, Pulse: 74 bpm, Respiratory Rate: 18 breaths/min, Blood Pressure: 122/93 mmHg. General Notes: Wound exam; the patient's wound is reasonably extensive on the left anterior tibia. Exposed tendon. Using a #3 curette debridement of very adherent necrotic debris over most of the wound surface. Post debridement things actually look fairly healthy. She has obvious chronic venous insufficiency. Her pedal pulses are palpable. She has some erythema around the wound but no tenderness. Above this she has 4 small circular papules in a cluster there is no tenderness in this area. I am not exactly sure what is causing this Integumentary (Hair, Skin) Wound #2 status is Open. Original cause of wound was Trauma. The wound is located on the Left,Anterior Lower Leg. The wound measures 4.8cm length x 3.6cm width x 0.3cm depth; 13.572cm^2 area and 4.072cm^3 volume. There is Fat Layer (Subcutaneous Tissue) Exposed exposed. There is no tunneling or undermining noted. There is a medium amount of serosanguineous drainage noted. The wound margin is distinct with the outline attached to the wound base. There is small (1-33%) pink granulation within the wound bed. There is a large (67-100%) amount of necrotic tissue within the wound bed including Adherent Slough. 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 There was a Excisional Skin/Subcutaneous Tissue Debridement with a total area of 17.28 sq cm performed by Maxwell Caul., MD. With the following instrument(s): Curette to remove Viable and Non-Viable tissue/material. Material removed includes Subcutaneous Tissue, Slough, Skin: Dermis, and Fibrin/Exudate after achieving pain control using Lidocaine 4% Topical Solution. A time out was conducted at 08:19, prior to the start of the procedure. A Minimum amount of bleeding was controlled with Pressure. The procedure was tolerated well with a pain level of 0 throughout and a pain level of 0 following the procedure. Post Debridement Measurements: 4.8cm length x 3.6cm width x 0.3cm depth; 4.072cm^3 volume. Character of Wound/Ulcer Post Debridement is improved. Severity of Tissue Post Debridement is: Fat layer exposed. Post procedure Diagnosis Wound #2: Same as Pre-Procedure Plan Follow-up Appointments: Return Appointment in 1 week. - Wednesday with Leonard Schwartz. Dressing Change Frequency: Wound #2 Left,Anterior Lower Leg: Change Dressing every other day. Skin Barriers/Peri-Wound Care: TCA Cream or Ointment - triamcinolone cream to periwound and thin layer over wound bed. Wound Cleansing: Wound #2 Left,Anterior Lower Leg: May shower and wash wound with soap and water. - scrub gently with gauze Primary Wound Dressing: Wound #2 Left,Anterior Lower Leg: Silver Collagen - promogram prisma moistened with saline Secondary Dressing: Wound #2 Left,Anterior Lower Leg: Kerlix/Rolled Gauze Telfa (non adherent pad) Edema Control: Avoid standing for long periods  of time Elevate legs to the level of the heart or above for 30 minutes daily and/or when sitting, a frequency of: - throughout the day Exercise regularly Additional Orders / Instructions: Other: - run IVR for apligraf 1. I continued with the Prisma. She is covering  this with Tegaderm and changing it herself 2. Still working on Apligraf which I think is a good choice in this area 3. I saw no evidence of infection although I did not have a clear explanation for the small circular areas perhaps for 5 cm proximal to the wound area. We took a good picture of this so we could monitor. Electronic Signature(s) Signed: 12/10/2019 5:58:56 PM By: Baltazar Najjarobson, Sherria Riemann MD Entered By: Baltazar Najjarobson, Olney Monier on 12/10/2019 08:48:31 -------------------------------------------------------------------------------- SuperBill Details Patient Name: Date of Service: Cheyenne LemonsSMITHEY, Cheyenne B. 12/10/2019 Medical Record [id number] Patient Account Number: [id number] Date of Birth/Sex: Treating RN: 05/21/1961 (59 y.o. 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, unspecified Facility Procedures CPT4 Code Description: [id number] 11042 - DEB SUBQ TISSUE 20 SQ CM/< ICD-10 Diagnosis Description L97.822 Non-pressure chronic ulcer of other part of left lower leg wi Modifier: th fat layer expo Quantity: 1 sed Physician Procedures CPT4 Code Description: [id number] 11042 - WC PHYS SUBQ TISS 20 SQ CM ICD-10 Diagnosis Description L97.822 Non-pressure chronic ulcer of other part of left lower leg wi Modifier: th fat layer expo Quantity: 1 sed Electronic Signature(s) Signed: 12/10/2019 5:58:56 PM By: Baltazar Najjarobson, Nary Sneed MD Entered By: Baltazar Najjarobson, Rosibel Giacobbe on 12/10/2019  08:48:53

## 2019-12-11 NOTE — Progress Notes (Addendum)
ZEBA, LUBY ([id number]) Visit Report for 12/10/2019 Arrival Information Details Patient Name: Date of Service: [name], [name] 12/10/2019 8:00 A M Medical Record Number: [id number] Patient Account Number: [id number] Date of Birth/Sex: Treating RN: 08-Jan-1961 (59 y.o. [name] Primary Care Dalten Ambrosino: [name], Dibas Other Clinician: Referring Raphael Fitzpatrick: Treating Anwen Cannedy/Extender: Otis Brace, Dibas Weeks in Treatment: 10 Visit Information History Since Last Visit Added or deleted any medications: No Patient Arrived: Ambulatory Any new allergies or adverse reactions: No Arrival Time: 08:04 Had a fall or experienced change in No Accompanied By: self activities of daily living that may affect Transfer Assistance: None risk of falls: Patient Identification Verified: Yes Signs or symptoms of abuse/neglect since last visito No Secondary Verification Process Completed: Yes Hospitalized since last visit: No Patient Requires Transmission-Based Precautions: No Implantable device outside of the clinic excluding No Patient Has Alerts: Yes cellular tissue based products placed in the center since last visit: Has Dressing in Place as Prescribed: Yes Pain Present Now: Yes Electronic Signature(s) Signed: 12/11/2019 6:10:03 PM By: Baruch Gouty RN, BSN Entered By: Baruch Gouty on 12/10/2019 08:04:40 -------------------------------------------------------------------------------- Encounter Discharge Information Details Patient Name: Date of Service: Cheyenne Mccallum B. 12/10/2019 8:00 A M Medical Record Number: [id number] Patient Account Number: [id number] Date of Birth/Sex: Treating RN: [birth year] (59 y.o. [name] Primary Care Alexiz Sustaita: [name], Wilkinson Other Clinician: Referring Reznor Ferrando: Treating Kensley Valladares/Extender: Otis Brace, Dibas Weeks in Treatment: 10 Encounter Discharge Information Items Post Procedure Vitals Discharge Condition:  Stable Temperature (F): 97.8 Ambulatory Status: Ambulatory Pulse (bpm): 74 Discharge Destination: Home Respiratory Rate (breaths/min): 18 Transportation: Private Auto Blood Pressure (mmHg): 122/93 Accompanied By: self Schedule Follow-up Appointment: Yes Clinical Summary of Care: Patient Declined Electronic Signature(s) Signed: 12/11/2019 6:10:03 PM By: Baruch Gouty RN, BSN Entered By: Baruch Gouty on 12/10/2019 08:43:48 -------------------------------------------------------------------------------- Lower Extremity Assessment Details Patient Name: Date of Service: Cheyenne Mccallum B. 12/10/2019 8:00 A M Medical Record Number: [id number] Patient Account Number: [id number] Date of Birth/Sex: Treating RN: 1961-07-23 (59 y.o. [name] Primary Care Euriah Matlack: [name], Dibas Other Clinician: Referring Shamecca Whitebread: Treating Harman Langhans/Extender: Otis Brace, Dibas Weeks in Treatment: 10 Edema Assessment Assessed: [Left: No] [Right: No] Edema: [Left: Ye] [Right: s] Calf Left: Right: Point of Measurement: 29 cm From Medial Instep 35 cm cm Ankle Left: Right: Point of Measurement: 8 cm From Medial Instep 21 cm cm Vascular Assessment Pulses: Dorsalis Pedis Palpable: [Left:Yes] Electronic Signature(s) Signed: 12/11/2019 6:10:03 PM By: Baruch Gouty RN, BSN Entered By: Baruch Gouty on 12/10/2019 08:12:42 -------------------------------------------------------------------------------- Multi Wound Chart Details Patient Name: Date of Service: Cheyenne Mccallum B. 12/10/2019 8:00 A M Medical Record Number: [id number] Patient Account Number: [id number] Date of Birth/Sex: Treating RN: Mar 23, 1961 (58 y.o. [name] Primary Care Marios Gaiser: [name], Dibas Other Clinician: Referring Catherine Oak: Treating Suhan Paci/Extender: Otis Brace, Dibas Weeks in Treatment: 10 Vital Signs Height(in): 66 Pulse(bpm): 74 Weight(lbs): 156 Blood Pressure(mmHg):  122/93 Body Mass Index(BMI): 25 Temperature(F): 97.8 Respiratory Rate(breaths/min): 18 Photos: [2:No Photos Left, Anterior Lower Leg] [N/A:N/A N/A] Wound Location: [2:Trauma] [N/A:N/A] Wounding Event: [2:Venous Leg Ulcer] [N/A:N/A] Primary Etiology: [2:Lymphedema, Raynauds,] [N/A:N/A] Comorbid History: [2:Neuropathy 08/06/2019] [N/A:N/A] Date Acquired: [2:10] [N/A:N/A] Weeks of Treatment: [2:Open] [N/A:N/A] Wound Status: [2:4.8x3.6x0.3] [N/A:N/A] Measurements L x W x D (cm) [2:13.572] [N/A:N/A] A (cm) : rea [2:4.072] [N/A:N/A] Volume (cm) : [2:25.60%] [N/A:N/A] % Reduction in Area: [2:-11.70%] [N/A:N/A] % Reduction in Volume: [2:Full Thickness Without Exposed] [N/A:N/A] Classification: [2:Support Structures Medium] [N/A:N/A] Exudate A mount: [2:Serosanguineous] [N/A:N/A] Exudate Type: [2:red, brown] [N/A:N/A] Exudate Color: [  2:Distinct, outline attached] [N/A:N/A] Wound Margin: [2:Small (1-33%)] [N/A:N/A] Granulation A mount: [2:Pink] [N/A:N/A] Granulation Quality: [2:Large (67-100%)] [N/A:N/A] Necrotic A mount: [2:Fat Layer (Subcutaneous Tissue)] [N/A:N/A] Exposed Structures: [2:Exposed: Yes Fascia: No Tendon: No Muscle: No Joint: No Bone: No Small (1-33%)] [N/A:N/A] Epithelialization: [2:Debridement - Excisional] [N/A:N/A] Debridement: Pre-procedure Verification/Time Out 08:19 [N/A:N/A] Taken: [2:Lidocaine 4% Topical Solution] [N/A:N/A] Pain Control: [2:Subcutaneous, Slough] [N/A:N/A] Tissue Debrided: [2:Skin/Subcutaneous Tissue] [N/A:N/A] Level: [2:17.28] [N/A:N/A] Debridement A (sq cm): [2:rea Curette] [N/A:N/A] Instrument: [2:Minimum] [N/A:N/A] Bleeding: [2:Pressure] [N/A:N/A] Hemostasis A chieved: [2:0] [N/A:N/A] Procedural Pain: [2:0] [N/A:N/A] Post Procedural Pain: [2:Procedure was tolerated well] [N/A:N/A] Debridement Treatment Response: [2:4.8x3.6x0.3] [N/A:N/A] Post Debridement Measurements L x W x D (cm) [2:4.072] [N/A:N/A] Post Debridement Volume: (cm)  [2:Debridement] [N/A:N/A] Treatment Notes Electronic Signature(s) Signed: 12/10/2019 5:58:56 PM By: Linton Ham MD Signed: 12/10/2019 6:08:37 PM By: Deon Pilling Entered By: Linton Ham on 12/10/2019 08:42:52 -------------------------------------------------------------------------------- Multi-Disciplinary Care Plan Details Patient Name: Date of Service: Cheyenne Mccallum B. 12/10/2019 8:00 A M Medical Record Number: [id number] Patient Account Number: [id number] Date of Birth/Sex: Treating RN: April 09, 1961 (59 y.o. [name] Primary Care Keyleen Cerrato: [name], Dibas Other Clinician: Referring Yousaf Sainato: Treating Rane Blitch/Extender: Otis Brace, Dibas Weeks in Treatment: 10 Active Inactive Venous Leg Ulcer Nursing Diagnoses: Knowledge deficit related to disease process and management Potential for venous Insuffiency (use before diagnosis confirmed) Goals: Patient will maintain optimal edema control Date Initiated: 09/30/2019 Target Resolution Date: 12/23/2019 Goal Status: Active Patient/caregiver will verbalize understanding of disease process and disease management Date Initiated: 09/30/2019 Date Inactivated: 11/25/2019 Target Resolution Date: 11/25/2019 Goal Status: Met Interventions: Assess peripheral edema status every visit. Compression as ordered Provide education on venous insufficiency Treatment Activities: Therapeutic compression applied : 09/30/2019 Notes: Wound/Skin Impairment Nursing Diagnoses: Impaired tissue integrity Knowledge deficit related to ulceration/compromised skin integrity Goals: Patient/caregiver will verbalize understanding of skin care regimen Date Initiated: 09/30/2019 Target Resolution Date: 12/23/2019 Goal Status: Active Ulcer/skin breakdown will have a volume reduction of 30% by week 4 Date Initiated: 09/30/2019 Date Inactivated: 10/28/2019 Target Resolution Date: 10/28/2019 Goal Status: Unmet Unmet Reason: other  comorbidities Interventions: Assess patient/caregiver ability to obtain necessary supplies Assess patient/caregiver ability to perform ulcer/skin care regimen upon admission and as needed Assess ulceration(s) every visit Provide education on ulcer and skin care Treatment Activities: Skin care regimen initiated : 09/30/2019 Topical wound management initiated : 09/30/2019 Notes: Electronic Signature(s) Signed: 12/10/2019 6:08:37 PM By: Deon Pilling Entered By: Deon Pilling on 12/10/2019 08:02:01 -------------------------------------------------------------------------------- Pain Assessment Details Patient Name: Date of Service: [name] 12/10/2019 8:00 A M Medical Record Number: [id number] Patient Account Number: [id number] Date of Birth/Sex: Treating RN: February 03, 1961 (60 y.o. [name] Primary Care Brian Kocourek: [name], [place] Other Clinician: Referring Trashawn Oquendo: Treating Samel Bruna/Extender: Otis Brace, Dibas Weeks in Treatment: 10 Active Problems Location of Pain Severity and Description of Pain Patient Has Paino Yes Site Locations With Dressing Change: Yes Duration of the Pain. Constant / Intermittento Intermittent Rate the pain. Current Pain Level: 5 Worst Pain Level: 6 Least Pain Level: 0 Character of Pain Describe the Pain: Burning, Other: stinging Pain Management and Medication Current Pain Management: Medication: Yes Is the Current Pain Management Adequate: Adequate How does your wound impact your activities of daily livingo Sleep: Yes Bathing: No Appetite: No Relationship With Others: No Bladder Continence: No Emotions: No Bowel Continence: No Work: No Toileting: No Drive: No Dressing: No Hobbies: No Electronic Signature(s) Signed: 12/11/2019 6:10:03 PM By: Baruch Gouty RN, BSN Entered By: Baruch Gouty on 12/10/2019 08:06:42 -------------------------------------------------------------------------------- Patient/Caregiver  Education Details Patient Name: Date of Service: [name], [name] 4/8/2021andnbsp8:00 A M Medical Record Number: [id number] Patient Account Number: [id number] Date of Birth/Gender: Treating RN: 04/04/1961 (59 y.o. [name] Primary Care Physician: [name], Dibas Other Clinician: Referring Physician: Treating Physician/Extender: Otis Brace, Dibas Weeks in Treatment: 10 Education Assessment Education Provided To: Patient Education Topics Provided Wound/Skin Impairment: Handouts: Skin Care Do's and Dont's Methods: Explain/Verbal Responses: Reinforcements needed Electronic Signature(s) Signed: 12/10/2019 6:08:37 PM By: Deon Pilling Entered By: Deon Pilling on 12/10/2019 08:02:33 -------------------------------------------------------------------------------- Wound Assessment Details Patient Name: Date of Service: [name] 12/10/2019 8:00 A M Medical Record Number: [id number] Patient Account Number: [id number] Date of Birth/Sex: Treating RN: December 25, 1960 (59 y.o. [name] Primary Care Khaylee Mcevoy: [name], Dibas Other Clinician: Referring Zoltan Genest: Treating Isabele Lollar/Extender: Otis Brace, Dibas Weeks in Treatment: 10 Wound Status Wound Number: 2 Primary Etiology: Venous Leg Ulcer Wound Location: Left, Anterior Lower Leg Wound Status: Open Wounding Event: Trauma Comorbid History: Lymphedema, Raynauds, Neuropathy Date Acquired: 08/06/2019 Weeks Of Treatment: 10 Clustered Wound: No Photos Wound Measurements Length: (cm) 4.8 Width: (cm) 3.6 Depth: (cm) 0.3 Area: (cm) 13.572 Volume: (cm) 4.072 % Reduction in Area: 25.6% % Reduction in Volume: -11.7% Epithelialization: Small (1-33%) Tunneling: No Undermining: No Wound Description Classification: Full Thickness Without Exposed Support Structures Wound Margin: Distinct, outline attached Exudate Amount: Medium Exudate Type: Serosanguineous Exudate Color: red, brown Foul  Odor After Cleansing: No Slough/Fibrino Yes Wound Bed Granulation Amount: Small (1-33%) Exposed Structure Granulation Quality: Pink Fascia Exposed: No Necrotic Amount: Large (67-100%) Fat Layer (Subcutaneous Tissue) Exposed: Yes Necrotic Quality: Adherent Slough Tendon Exposed: No Muscle Exposed: No Joint Exposed: No Bone Exposed: No Electronic Signature(s) Signed: 12/23/2019 12:23:53 PM By: Mikeal Hawthorne EMT/HBOT Signed: 01/19/2020 5:28:47 PM By: Baruch Gouty RN, BSN Previous Signature: 12/11/2019 6:10:03 PM Version By: Baruch Gouty RN, BSN Entered By: Mikeal Hawthorne on 12/23/2019 11:36:56 -------------------------------------------------------------------------------- Vitals Details Patient Name: Date of Service: Cheyenne Mccallum B. 12/10/2019 8:00 A M Medical Record Number: [id number] Patient Account Number: [id number] Date of Birth/Sex: Treating RN: 01-16-1961 (59 y.o. [name] Primary Care Alfreddie Consalvo: [name], Dibas Other Clinician: Referring Rmani Kapusta: Treating Darilyn Storbeck/Extender: Otis Brace, Dibas Weeks in Treatment: 10 Vital Signs Time Taken: 08:08 Temperature (F): 97.8 Height (in): 66 Pulse (bpm): 74 Source: Stated Respiratory Rate (breaths/min): 18 Weight (lbs): 156 Blood Pressure (mmHg): 122/93 Source: Stated Reference Range: 80 - 120 mg / dl Body Mass Index (BMI): 25.2 Electronic Signature(s) Signed: 12/11/2019 6:10:03 PM By: Baruch Gouty RN, BSN Entered By: Baruch Gouty on 12/10/2019 08:09:04

## 2019-12-16 NOTE — Progress Notes (Signed)
[name], [name] ([id number]) Visit Report for 10/07/2019 Arrival Information Details Patient Name: Date of Service: [name], [name] 10/07/2019 1:15 PM Medical Record [id number] Patient Account Number: [id number] Date of Birth/Sex: Treating RN: April 20, 1961 (59 y.o. F) Primary Care Shiloh Southern: Docia Chuck, Dibas Other Clinician: Referring Tell Rozelle: Treating Aidenn Skellenger/Extender:Stone III, Lizabeth Leyden, Dibas Weeks in Treatment: 1 Visit Information History Since Last Visit Added or deleted any medications: No Patient Arrived: Ambulatory Any new allergies or adverse reactions: No Arrival Time: 13:59 Had a fall or experienced change in No Accompanied By: self activities of daily living that may affect Transfer Assistance: None risk of falls: Patient Identification Verified: Yes Signs or symptoms of abuse/neglect since last No Secondary Verification Process Yes visito Completed: Hospitalized since last visit: No Patient Requires Transmission-Based No Has Dressing in Place as Prescribed: Yes Precautions: Pain Present Now: No Patient Has Alerts: Yes Electronic Signature(s) Signed: 12/16/2019 9:23:26 AM By: Karl Ito Entered By: Karl Ito on 10/07/2019 14:01:34 -------------------------------------------------------------------------------- Encounter Discharge Information Details Patient Name: Date of Service: [name] 10/07/2019 1:15 PM Medical Record [id number] Patient Account Number: [id number] Date of Birth/Sex: Treating RN: 1961/03/14 (58 y.o. Arta Silence Primary Care Lynnox Girten: Docia Chuck, Dibas Other Clinician: Referring Duwayne Matters: Treating Dsean Vantol/Extender:Stone III, Lizabeth Leyden, Dibas Weeks in Treatment: 1 Encounter Discharge Information Items Post Procedure Vitals Discharge Condition: Stable Temperature (F): 98.3 Ambulatory Status: Ambulatory Pulse (bpm): 78 Discharge Destination: Home Respiratory Rate (breaths/min): 18 Transportation:  Private Auto Blood Pressure (mmHg): 129/84 Accompanied By: self Schedule Follow-up Appointment: Yes Clinical Summary of Care: Electronic Signature(s) Signed: 10/07/2019 5:54:18 PM By: Shawn Stall Entered By: Shawn Stall on 10/07/2019 15:07:53 -------------------------------------------------------------------------------- Lower Extremity Assessment Details Patient Name: Date of Service: [name], [name] 10/07/2019 1:15 PM Medical Record [id number] Patient Account Number: [id number] Date of Birth/Sex: Treating RN: 07-Aug-1961 (59 y.o. Freddy Finner Primary Care Enriqueta Augusta: Docia Chuck, Dibas Other Clinician: Referring Ewell Benassi: Treating Regenia Erck/Extender:Stone III, Lizabeth Leyden, Dibas Weeks in Treatment: 1 Edema Assessment Assessed: [Left: No] [Right: No] Edema: [Left: Ye] [Right: s] Calf Left: Right: Point of Measurement: 29 cm From Medial Instep 36 cm cm Ankle Left: Right: Point of Measurement: 8 cm From Medial Instep 20 cm cm Electronic Signature(s) Signed: 10/09/2019 5:25:54 PM By: Yevonne Pax RN Entered By: Yevonne Pax on 10/07/2019 14:16:44 -------------------------------------------------------------------------------- Multi-Disciplinary Care Plan Details Patient Name: Date of Service: [name] 10/07/2019 1:15 PM Medical Record [id number] Patient Account Number: [id number] Date of Birth/Sex: Treating RN: 1961-02-27 (59 y.o. [name] Standard Primary Care Keoshia Steinmetz: Docia Chuck, Dibas Other Clinician: Referring Kaidyn Javid: Treating Farzad Tibbetts/Extender:Stone III, Lizabeth Leyden, Dibas Weeks in Treatment: 1 Active Inactive Abuse / Safety / Falls / Self Care Management Nursing Diagnoses: Potential for falls Goals: Patient/caregiver will verbalize/demonstrate measures taken to prevent injury and/or falls Date Initiated: 09/30/2019 Target Resolution Date: 10/28/2019 Goal Status: Active Interventions: Assess fall risk on admission and as needed Notes: Venous Leg  Ulcer Nursing Diagnoses: Knowledge deficit related to disease process and management Potential for venous Insuffiency (use before diagnosis confirmed) Goals: Patient will maintain optimal edema control Date Initiated: 09/30/2019 Target Resolution Date: 10/28/2019 Goal Status: Active Patient/caregiver will verbalize understanding of disease process and disease management Date Initiated: 09/30/2019 Target Resolution Date: 10/28/2019 Goal Status: Active Interventions: Assess peripheral edema status every visit. Compression as ordered Provide education on venous insufficiency Treatment Activities: Therapeutic compression applied : 09/30/2019 Notes: Wound/Skin Impairment Nursing Diagnoses: Impaired tissue integrity Knowledge deficit related to ulceration/compromised skin integrity Goals: Patient/caregiver will verbalize understanding of skin care regimen Date Initiated: 09/30/2019 Target Resolution Date:  10/28/2019 Goal Status: Active Ulcer/skin breakdown will have a volume reduction of 30% by week 4 Date Initiated: 09/30/2019 Target Resolution Date: 10/28/2019 Goal Status: Active Interventions: Assess patient/caregiver ability to obtain necessary supplies Assess patient/caregiver ability to perform ulcer/skin care regimen upon admission and as needed Assess ulceration(s) every visit Provide education on ulcer and skin care Treatment Activities: Skin care regimen initiated : 09/30/2019 Topical wound management initiated : 09/30/2019 Notes: Electronic Signature(s) Signed: 10/07/2019 6:36:45 PM By: Baruch Gouty RN, BSN Entered By: Baruch Gouty on 10/07/2019 14:45:07 -------------------------------------------------------------------------------- Pain Assessment Details Patient Name: Date of Service: [name] 10/07/2019 1:15 PM Medical Record [id number] Patient Account Number: [id number] Date of Birth/Sex: Treating RN: October 19, 1960 (59 y.o. F) Primary Care Anjanae Woehrle:  [name], Dibas Other Clinician: Referring Bernadette Gores: Treating Srijan Givan/Extender:Stone III, Sharen Counter, Dibas Weeks in Treatment: 1 Active Problems Location of Pain Severity and Description of Pain Patient Has Paino No Site Locations Pain Management and Medication Current Pain Management: Electronic Signature(s) Signed: 12/16/2019 9:23:26 AM By: Sandre Kitty Entered By: Sandre Kitty on 10/07/2019 14:02:00 -------------------------------------------------------------------------------- Patient/Caregiver Education Details Patient Name: Date of Service: [name], Cheyenne B. 2/3/2021andnbsp1:15 PM Medical Record [id number] Patient Account Number: [id number] Date of Birth/Gender: Treating RN: 1960-12-02 (58 y.o. [name] Primary Care Physician: [name], Dibas Other Clinician: Referring Physician: Treating Physician/Extender:Stone III, Sharen Counter, Dibas Weeks in Treatment: 1 Education Assessment Education Provided To: Patient Education Topics Provided Venous: Methods: Explain/Verbal Responses: Reinforcements needed, State content correctly Wound/Skin Impairment: Methods: Explain/Verbal Responses: Reinforcements needed, State content correctly Electronic Signature(s) Signed: 10/07/2019 6:36:45 PM By: Baruch Gouty RN, BSN Entered By: Baruch Gouty on 10/07/2019 14:45:59 -------------------------------------------------------------------------------- Wound Assessment Details Patient Name: Date of Service: [name] 10/07/2019 1:15 PM Medical Record [id number] Patient Account Number: [id number] Date of Birth/Sex: Treating RN: 1961-07-12 (60 y.o. F) Primary Care Jasiel Apachito: [name], Dibas Other Clinician: Referring Cedrik Heindl: Treating Ceara Wrightson/Extender:Stone III, Sharen Counter, Dibas Weeks in Treatment: 1 Wound Status Wound Number: 2 Primary Venous Leg Ulcer Etiology: Wound Location: Left Lower Leg - Anterior Wound Status: Open Wounding  Event: Trauma Comorbid Lymphedema, Raynauds, Date Acquired: 08/06/2019 History: Neuropathy Weeks Of Treatment: 1 Clustered Wound: No Photos Wound Measurements Length: (cm) 5.3 % Reduct Width: (cm) 3.5 % Reduct Depth: (cm) 0.2 Epitheli Area: (cm) 14.569 Tunneli Volume: (cm) 2.914 Undermi Wound Description Classification: Full Thickness Without Exposed Support Foul Od Structures Slough/ Wound Distinct, outline attached Margin: Exudate Medium Amount: Exudate Serosanguineous Type: Exudate red, brown Color: Wound Bed Granulation Amount: None Present (0%) Necrotic Amount: Large (67-100%) Fascia E Necrotic Quality: Adherent Slough Fat Laye Tendon E Muscle E Joint Ex Bone Exp or After Cleansing: No Fibrino Yes Exposed Structure xposed: No r (Subcutaneous Tissue) Exposed: Yes xposed: No xposed: No posed: No osed: No ion in Area: 20.1% ion in Volume: 20.1% alization: Small (1-33%) ng: No ning: No Electronic Signature(s) Signed: 10/13/2019 4:28:10 PM By: Mikeal Hawthorne EMT/HBOT Previous Signature: 10/09/2019 5:25:54 PM Version By: Carlene Coria RN Entered By: Mikeal Hawthorne on 10/13/2019 15:36:14 -------------------------------------------------------------------------------- Wound Assessment Details Patient Name: Date of Service: [name] 10/07/2019 1:15 PM Medical Record [id number] Patient Account Number: [id number] Date of Birth/Sex: Treating RN: 1960/09/07 (59 y.o. F) Primary Care Camp Gopal: [name], Dibas Other Clinician: Referring Vineta Carone: Treating Olanrewaju Osborn/Extender:Stone III, Sharen Counter, Dibas Weeks in Treatment: 1 Wound Status Wound Number: 3 Primary Venous Leg Ulcer Etiology: Wound Location: Left Lower Leg - Lateral Wound Status: Open Wounding Event: Trauma Comorbid Lymphedema, Raynauds, Date Acquired: 08/06/2019 History: Neuropathy Weeks Of Treatment: 1 Clustered Wound: No Photos  Wound Measurements Length: (cm) 0.6 Width: (cm)  0.3 Depth: (cm) 0.1 Area: (cm) 0.141 Volume: (cm) 0.014 Wound Description Classification: Unclassifiable Wound Margin: Distinct, outline attached Exudate Amount: Medium Exudate Type: Serosanguineous Exudate Color: red, brown Wound Bed Granulation Amount: None Present (0%) Necrotic Amount: Large (67-100%) Necrotic Quality: Adherent Slough After Cleansing: No brino Yes Exposed Structure osed: No (Subcutaneous Tissue) Exposed: No osed: No osed: No sed: No ed: No % Reduction in Area: 55.1% % Reduction in Volume: 54.8% Epithelialization: None Tunneling: No Undermining: No Foul Odor Slough/Fi Fascia Exp Fat Layer Tendon Exp Muscle Exp Joint Expo Bone Expos Electronic Signature(s) Signed: 10/13/2019 4:28:10 PM By: Benjaman Kindler EMT/HBOT Previous Signature: 10/09/2019 5:25:54 PM Version By: Yevonne Pax RN Entered By: Benjaman Kindler on 10/13/2019 15:34:55 -------------------------------------------------------------------------------- Vitals Details Patient Name: Date of Service: [name] 10/07/2019 1:15 PM Medical Record [id number] Patient Account Number: [id number] Date of Birth/Sex: Treating RN: 27-Jan-1961 (59 y.o. F) Primary Care Ercole Georg: Other Clinician: Koirala, Dibas Referring Antion Andres: Treating Braylin Xu/Extender:Stone III, Lizabeth Leyden, Dibas Weeks in Treatment: 1 Vital Signs Time Taken: 14:01 Temperature (F): 98.3 Height (in): 66 Pulse (bpm): 78 Weight (lbs): 156 Respiratory Rate (breaths/min): 18 Body Mass Index (BMI): 25.2 Blood Pressure (mmHg): 129/84 Reference Range: 80 - 120 mg / dl Electronic Signature(s) Signed: 12/16/2019 9:23:26 AM By: Karl Ito Entered By: Karl Ito on 10/07/2019 14:01:53

## 2019-12-16 NOTE — Progress Notes (Signed)
[name], [name] ([id number]) Visit Report for 12/02/2019 Arrival Information Details Patient Name: Date of Service: [name], [name] 12/02/2019 8:00 AM Medical Record [id number] Patient Account Number: [id number] Date of Birth/Sex: Treating RN: 16-Dec-1960 (59 y.o. [name]: [name], Dibas Other Clinician: Referring Richardson: Treating Richardson/Extender:[name], Dibas Weeks in Treatment: 9 Visit Information History Since Last Visit Added or deleted any medications: No Patient Arrived: Ambulatory Any new allergies or adverse reactions: No Arrival Time: 07:53 Had a fall or experienced change in No Accompanied By: self activities of daily living that may affect Transfer Assistance: None risk of falls: Patient Identification Verified: Yes Signs or symptoms of abuse/neglect since last No Secondary Verification Process Yes visito Completed: Hospitalized since last visit: No Patient Requires Transmission-Based No Implantable device outside of the clinic excluding No Precautions: cellular tissue based products placed in the center Patient Has Alerts: Yes since last visit: Has Dressing in Place as Prescribed: Yes Pain Present Now: Yes Electronic Signature(s) Signed: 12/16/2019 9:21:08 AM By: [name] Entered By: [name] on 12/02/2019 07:58:11 -------------------------------------------------------------------------------- Clinic Level of Care Assessment Details Patient Name: Date of Service: [name], [name] 12/02/2019 8:00 AM Medical Record [id number] Patient Account Number: [id number] Date of Birth/Sex: Treating RN: 1960-10-07 (59 y.o. [name]: [name], Dibas Other Clinician: Referring Richardson: Treating Richardson/Extender:[name], Dibas Weeks in Treatment: 9 Clinic Level of Care Assessment Items TOOL 4 Quantity Score [] - Use when only an EandM is  performed on FOLLOW-UP visit 0 ASSESSMENTS - Nursing Assessment / Reassessment X - Reassessment of Co-morbidities (includes updates in patient status) 1 10 X - Reassessment of Adherence to Treatment Plan 1 5 ASSESSMENTS - Wound and Skin Assessment / Reassessment X - Simple Wound Assessment / Reassessment - one wound 1 5 [] - Complex Wound Assessment / Reassessment - multiple wounds 0 [] - Dermatologic / Skin Assessment (not related to wound area) 0 ASSESSMENTS - Focused Assessment X - Circumferential Edema Measurements - multi extremities 1 5 [] - Nutritional Assessment / Counseling / Intervention 0 X - Lower Extremity Assessment (monofilament, tuning fork, pulses) 1 5 [] - Peripheral Arterial Disease Assessment (using hand held doppler) 0 ASSESSMENTS - Ostomy and/or Continence Assessment and Care [] - Incontinence Assessment and Management 0 [] - Ostomy Care Assessment and Management (repouching, etc.) 0 PROCESS - Coordination of Care X - Simple Patient / Family Education for ongoing care 1 15 [] - Complex (extensive) Patient / Family Education for ongoing care 0 X - Staff obtains Programmer, systems, Records, Test Results / Process Orders 1 10 [] - Staff telephones HHA, Nursing Homes / Clarify orders / etc 0 [] - Routine Transfer to another Facility (non-emergent condition) 0 [] - Routine Hospital Admission (non-emergent condition) 0 X - New Admissions / Biomedical engineer / Ordering NPWT, Apligraf, etc. 1 15 [] - Emergency Hospital Admission (emergent condition) 0 X - Simple Discharge Coordination 1 10 [] - Complex (extensive) Discharge Coordination 0 PROCESS - Special Needs [] - Pediatric / Minor Patient Management 0 [] - Isolation Patient Management 0 [] - Hearing / Language / Visual special needs 0 [] - Assessment of Community assistance (transportation, D/C planning, etc.) 0 [] - Additional assistance / Altered mentation 0 [] - Support Surface(s) Assessment (bed, cushion, seat,  etc.) 0 INTERVENTIONS - Wound Cleansing / Measurement X - Simple Wound Cleansing - one wound 1 5 [] - Complex Wound Cleansing - multiple wounds 0 X -  Wound Imaging (photographs - any number of wounds) 1 5 [] - Wound Tracing (instead of photographs) 0 X - Simple Wound Measurement - one wound 1 5 [] - Complex Wound Measurement - multiple wounds 0 INTERVENTIONS - Wound Dressings X - Small Wound Dressing one or multiple wounds 1 10 [] - Medium Wound Dressing one or multiple wounds 0 [] - Large Wound Dressing one or multiple wounds 0 X - Application of Medications - topical 1 5 [] - Application of Medications - injection 0 INTERVENTIONS - Miscellaneous [] - External ear exam 0 [] - Specimen Collection (cultures, biopsies, blood, body fluids, etc.) 0 [] - Specimen(s) / Culture(s) sent or taken to Lab for analysis 0 [] - Patient Transfer (multiple staff / Civil Service fast streamer / Similar devices) 0 [] - Simple Staple / Suture removal (25 or less) 0 [] - Complex Staple / Suture removal (26 or more) 0 [] - Hypo / Hyperglycemic Management (close monitor of Blood Glucose) 0 [] - Ankle / Brachial Index (ABI) - do not check if billed separately 0 X - Vital Signs 1 5 Has the patient been seen at the hospital within the last three years: Yes Total Score: 115 Level Of Care: New/Established - Level 3 Electronic Signature(s) Signed: 12/02/2019 6:50:57 PM By: [name] Entered By: Cheyenne Gouty on 12/02/2019 08:23:01 -------------------------------------------------------------------------------- Encounter Discharge Information Details Patient Name: Date of Service: Cheyenne Mccallum B. 12/02/2019 8:00 AM Medical Record [id number] Patient Account Number: [id number] Date of Birth/Sex: Treating RN: [birth year] (59 y.o. [name] Primary Care [name]: [name], Dibas Other Clinician: Referring [name]: Treating [name]/Extender:[name], Dibas Weeks in Treatment:  9 Encounter Discharge Information Items Discharge Condition: Stable Ambulatory Status: Ambulatory Discharge Destination: Home Transportation: Private Auto Accompanied By: self Schedule Follow-up Appointment: Yes Clinical Summary of Care: Electronic Signature(s) Signed: 12/02/2019 5:08:19 PM By: Deon Pilling Entered By: Deon Pilling on 12/02/2019 08:36:17 -------------------------------------------------------------------------------- Lower Extremity Assessment Details Patient Name: Date of Service: [name], [name] 12/02/2019 8:00 AM Medical Record [id number] Patient Account Number: [id number] Date of Birth/Sex: Treating RN: 1961/04/18 (59 y.o. [name] Primary Care Damia Bobrowski: [name], Dibas Other Clinician: Referring Noheli Melder: Treating Laresa Oshiro/Extender:[name], Dibas Weeks in Treatment: 9 Edema Assessment Assessed: [Left: No] [Right: No] Edema: [Left: Ye] [Right: s] Calf Left: Right: Point of Measurement: 29 cm From Medial Instep 36 cm cm Ankle Left: Right: Point of Measurement: 8 cm From Medial Instep 21.5 cm cm Vascular Assessment Pulses: Dorsalis Pedis Palpable: [Left:Yes] Electronic Signature(s) Signed: 12/02/2019 6:50:57 PM By: [name] Signed: 12/16/2019 9:21:08 AM By: [name] Entered By: [name] on 12/02/2019 08:05:39 -------------------------------------------------------------------------------- Multi-Disciplinary Care Plan Details Patient Name: Date of Service: Cheyenne Mccallum B. 12/02/2019 8:00 AM Medical Record [id number] Patient Account Number: [id number] Date of Birth/Sex: Treating RN: 1961/06/23 (59 y.o. [name] Primary Care Dina Warbington: [name], Dibas Other Clinician: Referring Airel Magadan: Treating Deloros Beretta/Extender:[name], Dibas Weeks in Treatment: 9 Active Inactive Venous Leg Ulcer Nursing Diagnoses: Knowledge deficit related to disease process and  management Potential for venous Insuffiency (use before diagnosis confirmed) Goals: Patient will maintain optimal edema control Date Initiated: 09/30/2019 Target Resolution Date: 12/23/2019 Goal Status: Active Patient/caregiver will verbalize understanding of disease process and disease management Date Initiated: 09/30/2019 Date Inactivated: 11/25/2019 Target Resolution Date: 11/25/2019 Goal Status: Met Interventions: Assess peripheral edema status every visit. Compression as ordered Provide education on venous insufficiency Treatment Activities: Therapeutic compression applied : 09/30/2019 Notes: Wound/Skin Impairment Nursing Diagnoses: Impaired tissue  integrity Knowledge deficit related to ulceration/compromised skin integrity Goals: Patient/caregiver will verbalize understanding of skin care regimen Date Initiated: 09/30/2019 Target Resolution Date: 12/23/2019 Goal Status: Active Ulcer/skin breakdown will have a volume reduction of 30% by week 4 Date Initiated: 09/30/2019 Date Inactivated: 10/28/2019 Target Resolution Date: 10/28/2019 Unmet Reason: other Goal Status: Unmet comorbidities Interventions: Assess patient/caregiver ability to obtain necessary supplies Assess patient/caregiver ability to perform ulcer/skin care regimen upon admission and as needed Assess ulceration(s) every visit Provide education on ulcer and skin care Treatment Activities: Skin care regimen initiated : 09/30/2019 Topical wound management initiated : 09/30/2019 Notes: Electronic Signature(s) Signed: 12/02/2019 6:50:57 PM By: [name] Entered By: Cheyenne Gouty on 12/02/2019 08:22:00 -------------------------------------------------------------------------------- Pain Assessment Details Patient Name: Date of Service: [name] 12/02/2019 8:00 AM Medical Record [id number] Patient Account Number: [id number] Date of Birth/Sex: Treating RN: 02/28/1961 (59 y.o. [name] Primary Care Arthella Headings: [name], Dibas Other Clinician: Referring Gina Costilla: Treating Patrycja Mumpower/Extender:[name], Dibas Weeks in Treatment: 9 Active Problems Location of Pain Severity and Description of Pain Patient Has Paino Yes Site Locations Rate the pain. Current Pain Level: 4 Pain Management and Medication Current Pain Management: Electronic Signature(s) Signed: 12/02/2019 6:50:57 PM By: [name] Signed: 12/16/2019 9:21:08 AM By: [name] Entered By: [name] on 12/02/2019 08:00:22 -------------------------------------------------------------------------------- Patient/Caregiver Education Details Patient Name: Date of Service: [name], Cheyenne B. 3/31/2021andnbsp8:00 AM Medical Record [id number] Patient Account Number: [id number] Date of Birth/Gender: Treating RN: 1961-06-03 (59 y.o. [name] Primary Care Physician: [name], Dibas Other Clinician: Referring Physician: Treating Physician/Extender:[name], Dibas Weeks in Treatment: 9 Education Assessment Education Provided To: Patient Education Topics Provided Venous: Methods: Explain/Verbal Responses: Reinforcements needed, State content correctly Wound/Skin Impairment: Methods: Explain/Verbal Responses: Reinforcements needed, State content correctly Electronic Signature(s) Signed: 12/02/2019 6:50:57 PM By: [name] Entered By: Cheyenne Gouty on 12/02/2019 94:70:96 -------------------------------------------------------------------------------- Wound Assessment Details Patient Name: Date of Service: [name] 12/02/2019 8:00 AM Medical Record [id number] Patient Account Number: [id number] Date of Birth/Sex: Treating RN: 02-11-1961 (59 y.o. [name] Primary Care Kashana Breach: [name], Dibas Other Clinician: Referring Sumer Moore[place]: Treating Lori Popowski/Extender:[name], Dibas Weeks in  Treatment: 9 Wound Status Wound Number: 2 Primary Venous Leg Ulcer Etiology: Wound Location: Left Lower Leg - Anterior Wound Status: Open Wounding Event: Trauma Comorbid Lymphedema, Raynauds, Date Acquired: 08/06/2019 History: Neuropathy Weeks Of Treatment: 9 Clustered Wound: No Photos Photo Uploaded By: Mikeal Hawthorne on 12/04/2019 11:06:28 Wound Measurements Length: (cm) 5 % Reduct Width: (cm) 3.6 % Reduct Depth: (cm) 0.3 Epitheli Area: (cm) 14.137 Tunneli Volume: (cm) 4.241 Undermi Wound Description Classification: Full Thickness Without Exposed Support Foul Od Structures Slough/ Wound Distinct, outline attached Margin: Exudate Medium Amount: Exudate Serosanguineous Type: Exudate red, brown Color: Wound Bed Granulation Amount: Small (1-33%) Granulation Quality: Pink Fascia E Necrotic Amount: Large (67-100%) Fat Laye Necrotic Quality: Adherent Slough Tendon E Muscle E Joint Ex Bone Exp or After Cleansing: No Fibrino Yes Exposed Structure xposed: No r (Subcutaneous Tissue) Exposed: Yes xposed: No xposed: No posed: No osed: No ion in Area: 22.5% ion in Volume: -16.3% alization: Small (1-33%) ng: No ning: No Electronic Signature(s) Signed: 12/02/2019 6:50:57 PM By: [name] Signed: 12/16/2019 9:21:08 AM By: [name] Entered By: [name] on 12/02/2019 08:05:19 -------------------------------------------------------------------------------- Vitals Details Patient Name: Date of Service: Cheyenne Mccallum B. 12/02/2019 8:00 AM Medical Record [id number] Patient Account Number: [id number] Date of Birth/Sex: Treating RN: 05/15/1961 (59 y.o. F) [name],  [name] Primary Care Bethanne Mule: [name], Dibas Other Clinician: Referring Shyhiem Beeney: Treating Chanoch Mccleery/Extender:[name], Dibas Weeks in Treatment: 9 Vital Signs Time Taken: 07:59 Temperature (F): 98.1 Height (in): 66 Pulse (bpm): 75 Weight (lbs):  156 Respiratory Rate (breaths/min): 18 Body Mass Index (BMI): 25.2 Blood Pressure (mmHg): 126/78 Reference Range: 80 - 120 mg / dl Electronic Signature(s) Signed: 12/16/2019 9:21:08 AM By: [name] Entered By: [name] on 12/02/2019 08:00:11

## 2019-12-17 ENCOUNTER — Other Ambulatory Visit: Payer: Self-pay

## 2019-12-17 ENCOUNTER — Encounter (HOSPITAL_BASED_OUTPATIENT_CLINIC_OR_DEPARTMENT_OTHER): Payer: Worker's Compensation | Admitting: Internal Medicine

## 2019-12-17 DIAGNOSIS — L97822 Non-pressure chronic ulcer of other part of left lower leg with fat layer exposed: Secondary | ICD-10-CM | POA: Diagnosis not present

## 2019-12-19 NOTE — Progress Notes (Signed)
Cheyenne, Richardson ([id number]) Visit Report for 12/17/2019 HPI Details Patient Name: Date of Service: [name], [name] 12/17/2019 1:15 PM Medical Record [id number] Patient Account Number: [id number] Date of Birth/Sex: Treating RN: February 14, 1961 (59 y.o. Arta Silence Primary Care Provider: Docia Chuck, Dibas Other Clinician: Referring Provider: Treating Provider/Extender:Rama Mcclintock, Effie Shy, Dibas Weeks in Treatment: 11 History of Present Illness HPI Description: 09/30/2019 upon evaluation today patient presents for initial inspection here in our clinic concerning issues that she has been experiencing with a wound on her left anterior lower leg. She unfortunately had a significant laceration which required 19 sutures and she shows me the pictures both before as well as after sutured in a very good job was done in this regard. Unfortunately the skin did not take and necrosis. Subsequently this wound open and she has been having more significant issues with that since. Fortunately there is no evidence of active infection at this time which is good news. No fever chills noted. She has a history of chronic venous stasis but is no longer able to wear any compression in fact she cannot even wear regular socks due to her neuropathy and the pain that she experiences in her feet if she does. For that reason she tries to elevate her legs as much as she can at nighttime but again she really cannot wear any type of compression. With regard to antibiotic it does not appear that she has been on anything recently. 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 Fortunately there is no evidence of local or systemic infection. No fevers, chills, nausea, vomiting, or diarrhea. 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 10/07/2019 on evaluation today patient presents for follow-up concerning her wound on the left lower extremity. She has been tolerating the dressing changes without complication. With that being said she just got the Santyl 2 days ago. Obviously it has not had enough time to really do what we need to do she would like to obviously give this some time before proceeding with more aggressive debridement or otherwise. 10/21/2019 upon evaluation today patient appears to be doing better with regard to the overall appearance of her wound is not quite as dry I do believe the Santyl has helped her improve to some degree here. Fortunately there is no signs of active infection at this time. I think we may be able to clean off the wound with a little bit of additional debridement today and likely we can transition away from the Santyl to some kind of collagen type dressing. 10/28/2019 upon evaluation today patient actually appears to be doing fairly well with regard to her wound today. She has been tolerating the dressing changes we did switch to collagen and fortunately that seems to be doing well for her at this point. There does not seem to be any signs of active infection I do feel like the surface of the wound is looking better at this time as well. 11/04/2019 upon evaluation today patient appears to be doing well with regard to her wound. She is making some progress here and the wound does appear to be measuring smaller which is good news. Overall this is good to be somewhat slow  but nonetheless I am encouraged by the fact that the collagen does seem to have helped her. 11/11/2019 on evaluation today patient's wound is showing some signs of improvement. The good news is her overall trend does seem to be towards getting better compared to where she was previous. The smaller wounds along the side actually might even be healed but we can watch this 1 more week  before I heal this out. Overall I feel like there is improvement here. She is still using the silver collagen. 11/18/2019 upon evaluation today patient appears to be doing about the same with regard to her wound. This is not significantly smaller there still appears to be a lot of inflammation. For that reason I do want to see about actually applying triamcinolone to the actual wound bed to see if this can be of benefit as well. I thought about this for couple weeks but I think it may be a good thing to proceed with at this point. 11/25/2019 on evaluation today patient appears to be doing well with regard to her lower extremity ulcer. She has been tolerating the dressing changes without complication. Fortunately there is no signs of active infection at this time. No fevers, chills, nausea, vomiting, or diarrhea. 12/02/2019 on evaluation today patient appears to be doing okay with regard to her ulcer on the leg. Fortunately there is no signs of active infection at this time. No fever chills noted she has been tolerating the dressing changes without complication. With that being said overall I am pleased with the fact that there is no signs of infection but at the same time I really feel like she needs more to progress moving week to week and right now were really not seeing that. We have put in for approval for Apligraf which I think could help to move things along. With that being said we have not heard anything regarding approval at this point. 12/10/2019; patient comes in on my day although have not seen her previously. She has what was a traumatic wound on the left anterior mid tibia. Initially sutured however the area never really healed according to the patient. She has been using Santyl now using Prisma. There is exposed tendon. We have made application for Apligraf apparently this is going through [employer] still has not been approved. She arrives in clinic with erythema around the  wound and 4 small circular papules superior to the wound. These are nontender and she is not complaining of any more pain. The exact cause of this is not really clear. The patient has idiopathic peripheral neuropathy which for which she has been extensively evaluated in the past she is not a diabetic she does not have any rheumatologic problems she is aware of. She cannot wear compression because it aggravates her neuropathy 4/15; traumatic wound on the left anterior mid tibia. She arrives today with extensive erythema spreading from the wound medially towards her heel. This is tender swollen and warm compatible with cellulitis. We are using silver collagen to the wound Electronic Signature(s) Signed: 12/19/2019 6:55:41 AM By: Baltazar Najjar MD Entered By: Baltazar Najjar on 12/17/2019 13:46:32 -------------------------------------------------------------------------------- Physical Exam Details Patient Name: Date of Service: [name] 12/17/2019 1:15 PM Medical Record [id number] Patient Account Number: [id number] Date of Birth/Sex: Treating RN: 17-Dec-1960 (59 y.o. Arta Silence Primary Care Provider: Docia Chuck, Dibas Other Clinician: Referring Provider: Treating Provider/Extender:Arneda Sappington, Effie Shy, Dibas Weeks in Treatment: 11 Constitutional Sitting or standing Blood Pressure is within target range for patient.Marland Kitchen  Pulse regular and within target range for patient.Marland Kitchen. Respirations regular, non-labored and within target range.. Temperature is normal and within the target range for the patient.Marland Kitchen. Appears in no distress. She is not systemically unwell. Cardiovascular Pedal pulses are intact. Notes Wound exam; the patient's wound is reasonably extensive in the left anterior tibia. Exposed tendon. I did not mechanically debride this today I did wash it off with wound cleanser and gauze. The surface looks somewhat better. More problematically there is an extensive albeit  superficial cellulitis going from the wound down to her heel medially. This is tender and warm I have marked the area Electronic Signature(s) Signed: 12/19/2019 6:55:41 AM By: Baltazar Najjarobson, Amour Trigg MD Entered By: Baltazar Najjarobson, Brynlea Spindler on 12/17/2019 13:49:00 -------------------------------------------------------------------------------- Physician Orders Details Patient Name: Date of Service: Lavella LemonsSMITHEY, Kristine B. 12/17/2019 1:15 PM Medical Record [id number] Patient Account Number: [id number] Date of Birth/Sex: Treating RN: 1961/05/20 (59 y.o. 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 - Tuesday Dressing Change Frequency Wound #2 Left,Anterior Lower Leg Change Dressing every other day. Skin Barriers/Peri-Wound Care TCA Cream or Ointment - triamcinolone cream to periwound and thin layer over wound bed. Wound Cleansing Wound #2 Left,Anterior Lower Leg May shower and wash wound with soap and water. - scrub gently with gauze Primary Wound Dressing Wound #2 Left,Anterior Lower Leg Silver Collagen - promogram prisma moistened with saline Secondary Dressing Wound #2 Left,Anterior Lower Leg Kerlix/Rolled Gauze Telfa (non adherent pad) Edema Control Avoid standing for long periods of time Elevate legs to the level of the heart or above for 30 minutes daily and/or  when sitting, a frequency of: - throughout the day Exercise regularly Additional Orders / Instructions Other: - patient to go to ED or urgent care if redness and swelling increases. Patient Medications Allergies: doxycycline, Lyrica, Vivelle, Aleve, Motrin, adhesive bandage Notifications Medication Indication Start End cephalexin 12/17/2019 DOSE oral 500 mg capsule - 1 capsule oral q6h for 7 days Electronic Signature(s) Signed: 12/17/2019 1:51:40 PM By: Baltazar Najjarobson, Milderd Manocchio MD Entered By: Baltazar Najjarobson, Admiral Marcucci on 12/17/2019 13:51:39 -------------------------------------------------------------------------------- Problem List Details Patient Name: Date of Service: Lavella LemonsSMITHEY, Ieshia B. 12/17/2019 1:15 PM Medical Record [id number] Patient Account Number: [id number] Date of Birth/Sex: Treating RN: 1961/05/20 (59 y.o. Arta SilenceF) Deaton, Bobbi Primary Care Provider: Docia ChuckKoirala, Dibas Other Clinician: Referring Provider: Treating Provider/Extender:Tammy Wickliffe, Effie ShyMichael Koirala, Dibas Weeks in Treatment: 11 Active Problems ICD-10 Evaluated Encounter Code Description Active Date Today Diagnosis I87.2 Venous insufficiency (chronic) (peripheral) 09/30/2019 No Yes L97.822 Non-pressure chronic ulcer of other part of left lower 09/30/2019 No Yes leg with fat layer exposed L03.116 Cellulitis of left lower limb 12/17/2019 No Yes L97.828 Non-pressure chronic ulcer of other part of left lower 09/30/2019 No Yes leg with other specified severity 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 12/17/2019 1:15 PM Medical Record [id number] Patient Account Number: [id number] Date of Birth/Sex: Treating RN: 11-06-1960 (59 y.o. Arta Silence Primary Care Provider: Docia Chuck, Dibas Other Clinician: Referring Provider: Treating Provider/Extender:Heidemarie Goodnow, Effie Shy, Dibas Weeks in Treatment: 11 Subjective History of Present Illness (HPI) 09/30/2019 upon evaluation today patient presents for initial inspection here in our clinic concerning issues that she has been experiencing with a wound on her left anterior lower leg. She unfortunately had a significant laceration which required 19 sutures and she shows me the pictures both before as well as after sutured in a very good job was done in this regard. Unfortunately the skin did not take and necrosis. Subsequently this wound open and she has been having more significant issues with that since. Fortunately there is no evidence of active infection at this time which is good news. No fever chills noted. She has a history of chronic venous stasis but is no longer able to wear any compression in fact she cannot even wear regular socks due to her neuropathy and the pain that she experiences in her feet if she does. For that reason she tries to elevate her legs as much as she can at nighttime but again she really cannot wear any type of compression. With regard to antibiotic it does not appear that she has been on anything recently. 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 Fortunately there is no evidence of local or systemic infection. No fevers, chills, nausea, vomiting, or diarrhea. 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 10/07/2019 on evaluation today patient  presents for follow-up concerning her wound on the left lower extremity. She has been tolerating the dressing changes without complication. With that being said she just got the Santyl 2 days ago. Obviously it has not had enough time to really do what we need to do she would like to obviously give this some time before proceeding with more aggressive debridement or otherwise. 10/21/2019 upon evaluation today patient appears to be doing better with regard to the overall appearance of her wound is not quite as dry I do believe the Santyl has helped her improve to some degree here. Fortunately there is no signs of active infection at this time. I think we may be able to clean off the wound with a little bit of additional debridement today and likely we can transition away from the Santyl to some kind of collagen type dressing. 10/28/2019 upon evaluation today patient actually appears to be doing fairly well with regard to her wound today. She has been tolerating the dressing changes we did switch to collagen and fortunately that seems to be doing well for her at this point. There does not seem to be any signs of active infection I do feel like the surface of the wound is looking better at this time as well. 11/04/2019 upon evaluation today patient appears to be doing well with regard to her wound. She is making some progress here and the wound does appear to be measuring smaller which is good news. Overall this is good to be somewhat slow but nonetheless I am encouraged by the fact that the collagen does seem to have  helped her. 11/11/2019 on evaluation today patient's wound is showing some signs of improvement. The good news is her overall trend does seem to be towards getting better compared to where she was previous. The smaller wounds along the side actually might even be healed but we can watch this 1 more week before I heal this out. Overall I feel like there is improvement here. She is still using  the silver collagen. 11/18/2019 upon evaluation today patient appears to be doing about the same with regard to her wound. This is not significantly smaller there still appears to be a lot of inflammation. For that reason I do want to see about actually applying triamcinolone to the actual wound bed to see if this can be of benefit as well. I thought about this for couple weeks but I think it may be a good thing to proceed with at this point. 11/25/2019 on evaluation today patient appears to be doing well with regard to her lower extremity ulcer. She has been tolerating the dressing changes without complication. Fortunately there is no signs of active infection at this time. No fevers, chills, nausea, vomiting, or diarrhea. 12/02/2019 on evaluation today patient appears to be doing okay with regard to her ulcer on the leg. Fortunately there is no signs of active infection at this time. No fever chills noted she has been tolerating the dressing changes without complication. With that being said overall I am pleased with the fact that there is no signs of infection but at the same time I really feel like she needs more to progress moving week to week and right now were really not seeing that. We have put in for approval for Apligraf which I think could help to move things along. With that being said we have not heard anything regarding approval at this point. 12/10/2019; patient comes in on my day although have not seen her previously. She has what was a traumatic wound on the left anterior mid tibia. Initially sutured however the area never really healed according to the patient. She has been using Santyl now using Prisma. There is exposed tendon. We have made application for Apligraf apparently this is going through [employer] still has not been approved. She arrives in clinic with erythema around the wound and 4 small circular papules superior to the wound. These are nontender and she is  not complaining of any more pain. The exact cause of this is not really clear. The patient has idiopathic peripheral neuropathy which for which she has been extensively evaluated in the past she is not a diabetic she does not have any rheumatologic problems she is aware of. She cannot wear compression because it aggravates her neuropathy 4/15; traumatic wound on the left anterior mid tibia. She arrives today with extensive erythema spreading from the wound medially towards her heel. This is tender swollen and warm compatible with cellulitis. We are using silver collagen to the wound Objective Constitutional Sitting or standing Blood Pressure is within target range for patient.. Pulse regular and within target range for patient.Marland Kitchen Respirations regular, non-labored and within target range.. Temperature is normal and within the target range for the patient.Marland Kitchen Appears in no distress. She is not systemically unwell. Vitals Time Taken: 12:57 PM, Height: 66 in, Weight: 156 lbs, BMI: 25.2, Temperature: 97.7 F, Pulse: 71 bpm, Respiratory Rate: 18 breaths/min, Blood Pressure: 130/77 mmHg. Cardiovascular Pedal pulses are intact. General Notes: Wound exam; the patient's wound is reasonably extensive in the left anterior tibia.  Exposed tendon. I did not mechanically debride this today I did wash it off with wound cleanser and gauze. The surface looks somewhat better. ooMore problematically there is an extensive albeit superficial cellulitis going from the wound down to her heel medially. This is tender and warm I have marked the area Integumentary (Hair, Skin) Wound #2 status is Open. Original cause of wound was Trauma. The wound is located on the Left,Anterior Lower Leg. The wound measures 5.1cm length x 4cm width x 0.3cm depth; 16.022cm^2 area and 4.807cm^3 volume. There is Fat Layer (Subcutaneous Tissue) Exposed exposed. There is no tunneling or undermining noted. There is a medium amount of  serosanguineous drainage noted. The wound margin is distinct with the outline attached to the wound base. There is small (1-33%) pink granulation within the wound bed. There is a large (67-100%) amount of necrotic tissue within the wound bed including Adherent Slough. 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 - Tuesday Dressing Change Frequency: Wound #2 Left,Anterior Lower Leg: Change Dressing every other day. Skin Barriers/Peri-Wound Care: TCA Cream or Ointment - triamcinolone cream to periwound and thin layer over wound bed. Wound Cleansing: Wound #2 Left,Anterior Lower Leg: May shower and wash wound with soap and water. - scrub gently with gauze Primary Wound Dressing: Wound #2 Left,Anterior Lower Leg: Silver Collagen - promogram prisma moistened with saline Secondary Dressing: Wound #2 Left,Anterior Lower Leg: Kerlix/Rolled Gauze Telfa (non adherent pad) Edema Control: Avoid standing for long periods of time Elevate legs to the level of the heart or above for 30 minutes daily and/or when sitting, a frequency of: - throughout the day Exercise regularly Additional Orders / Instructions: Other: - patient to go to ED or urgent care if redness and swelling increases. The following medication(s) was prescribed: cephalexin oral 500 mg capsule 1 capsule oral q6h for 7 days starting 12/17/2019 1. Continue silver collagen Curlex roll gauze 2. Cephalexin 500 every 6 x7 days. Follow-up in 4 days Electronic Signature(s) Signed: 12/17/2019 1:52:10 PM By: Baltazar Najjar  MD Entered By: Baltazar Najjar on 12/17/2019 13:52:10 -------------------------------------------------------------------------------- SuperBill Details Patient Name: Date of Service: [name] 12/17/2019 Medical Record [id number] Patient Account Number: [id number] Date of Birth/Sex: Treating RN: 1961/02/08 (59 y.o. 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, unspecified Facility Procedures CPT4 Code: [id number] Description: 99214 - WOUND CARE VISIT-LEV 4 EST PT Modifier: Quantity: 1 Physician Procedures CPT4 Code Description: [id number] - WC PHYS LEVEL 3 - EST PT ICD-10 Diagnosis Description L03.116 Cellulitis of left lower limb L97.822 Non-pressure chronic ulcer of other part of left lower le L97.828 Non-pressure chronic ulcer of other part of left  lower le severity Modifier: g with fat layer e g with other speci Quantity: 1 xposed Dance movement psychotherapist  Signature(s) Signed: 12/17/2019 4:13:32 PM By: Deon Pilling Signed: 12/19/2019 6:55:41 AM By: Linton Ham MD Entered By: Deon Pilling on 12/17/2019 15:30:29

## 2019-12-22 ENCOUNTER — Encounter (HOSPITAL_BASED_OUTPATIENT_CLINIC_OR_DEPARTMENT_OTHER): Payer: Worker's Compensation | Admitting: Internal Medicine

## 2019-12-22 ENCOUNTER — Other Ambulatory Visit: Payer: Self-pay

## 2019-12-22 DIAGNOSIS — L97822 Non-pressure chronic ulcer of other part of left lower leg with fat layer exposed: Secondary | ICD-10-CM | POA: Diagnosis not present

## 2019-12-22 NOTE — Progress Notes (Signed)
[name], [name] ([id number]) Visit Report for 12/22/2019 HPI Details Patient Name: Date of Service: [name], [name] 12/22/2019 7:30 AM Medical Record [id number] Patient Account Number: [id number] Date of Birth/Sex: Treating RN: 07/22/1961 (59 y.o. Freddy Finner Primary Care Provider: Docia Chuck, Dibas Other Clinician: Referring Provider: Treating Provider/Extender:Altha Sweitzer, Effie Shy, Dibas Weeks in Treatment: 11 History of Present Illness HPI Description: 09/30/2019 upon evaluation today patient presents for initial inspection here in our clinic concerning issues that she has been experiencing with a wound on her left anterior lower leg. She unfortunately had a significant laceration which required 19 sutures and she shows me the pictures both before as well as after sutured in a very good job was done in this regard. Unfortunately the skin did not take and necrosis. Subsequently this wound open and she has been having more significant issues with that since. Fortunately there is no evidence of active infection at this time which is good news. No fever chills noted. She has a history of chronic venous stasis but is no longer able to wear any compression in fact she cannot even wear regular socks due to her neuropathy and the pain that she experiences in her feet if she does. For that reason she tries to elevate her legs as much as she can at nighttime but again she really cannot wear any type of compression. With regard to antibiotic it does not appear that she has been on anything recently. 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 Fortunately there is no evidence of local or systemic infection. No fevers, chills, nausea, vomiting, or diarrhea. 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 10/07/2019 on evaluation today patient presents for follow-up concerning her wound on the left lower extremity. She has been tolerating the dressing changes without complication. With that being said she just got the Santyl 2 days ago. Obviously it has not had enough time to really do what we need to do she would like to obviously give this some time before proceeding with more aggressive debridement or otherwise. 10/21/2019 upon evaluation today patient appears to be doing better with regard to the overall appearance of her wound is not quite as dry I do believe the Santyl has helped her improve to some degree here. Fortunately there is no signs of active infection at this time. I think we may be able to clean off the wound with a little bit of additional debridement today and likely we can transition away from the Santyl to some kind of collagen type dressing. 10/28/2019 upon evaluation today patient actually appears to be doing fairly well with regard to her wound today. She has been tolerating the dressing changes we did switch to collagen and fortunately that seems to be doing well for her at this point. There does not seem to be any signs of active infection I do feel like the surface of the wound is looking better at this time as well. 11/04/2019 upon evaluation today patient appears to be doing well with regard to her wound. She is making some progress here and the wound does appear to be measuring smaller which is good news. Overall this is good to be somewhat slow  but nonetheless I am encouraged by the fact that the collagen does seem to have helped her. 11/11/2019 on evaluation today patient's wound is showing some signs of improvement. The good news is her overall trend does seem to be towards getting better compared to where she was previous. The smaller wounds along the side actually might even be healed but we can watch this 1 more week before I  heal this out. Overall I feel like there is improvement here. She is still using the silver collagen. 11/18/2019 upon evaluation today patient appears to be doing about the same with regard to her wound. This is not significantly smaller there still appears to be a lot of inflammation. For that reason I do want to see about actually applying triamcinolone to the actual wound bed to see if this can be of benefit as well. I thought about this for couple weeks but I think it may be a good thing to proceed with at this point. 11/25/2019 on evaluation today patient appears to be doing well with regard to her lower extremity ulcer. She has been tolerating the dressing changes without complication. Fortunately there is no signs of active infection at this time. No fevers, chills, nausea, vomiting, or diarrhea. 12/02/2019 on evaluation today patient appears to be doing okay with regard to her ulcer on the leg. Fortunately there is no signs of active infection at this time. No fever chills noted she has been tolerating the dressing changes without complication. With that being said overall I am pleased with the fact that there is no signs of infection but at the same time I really feel like she needs more to progress moving week to week and right now were really not seeing that. We have put in for approval for Apligraf which I think could help to move things along. With that being said we have not heard anything regarding approval at this point. 12/10/2019; patient comes in on my day although have not seen her previously. She has what was a traumatic wound on the left anterior mid tibia. Initially sutured however the area never really healed according to the patient. She has been using Santyl now using Prisma. There is exposed tendon. We have made application for Apligraf apparently this is going through [employer] still has not been approved. She arrives in clinic with erythema around the wound and  4 small circular papules superior to the wound. These are nontender and she is not complaining of any more pain. The exact cause of this is not really clear. The patient has idiopathic peripheral neuropathy which for which she has been extensively evaluated in the past she is not a diabetic she does not have any rheumatologic problems she is aware of. She cannot wear compression because it aggravates her neuropathy 4/15; traumatic wound on the left anterior mid tibia. She arrives today with extensive erythema spreading from the wound medially towards her heel. This is tender swollen and warm compatible with cellulitis. We are using silver collagen to the wound 4/20; 5-day follow-up. Traumatic wound on the left anterior mid tibia. This is in follow-up for the cellulitis she had on her visit 5 days ago. I gave her empiric cephalexin I did not culture this. She arrives today with the erythema that we marked much improved. She has not been systemically unwell Electronic Signature(s) Signed: 12/22/2019 5:47:38 PM By: Baltazar Najjar MD Entered By: Baltazar Najjar on 12/22/2019 08:33:52 -------------------------------------------------------------------------------- Physical Exam Details Patient Name: Date of Service: [name]  Cheyenne B. 12/22/2019 7:30 AM Medical Record [id number] Patient Account Number: [id number] Date of Birth/Sex: Treating RN: 04/17/1961 (59 y.o. Freddy Finner Primary Care Provider: Docia Chuck, Dibas Other Clinician: Referring Provider: Treating Provider/Extender:Jaki Hammerschmidt, Effie Shy, Dibas Weeks in Treatment: 11 Constitutional Sitting or standing Blood Pressure is within target range for patient.. Pulse regular and within target range for patient.Marland Kitchen Respirations regular, non-labored and within target range.. Temperature is normal and within the target range for the patient.Marland Kitchen Appears in no distress. Cardiovascular Pedal pulses are palpable. Notes Wound exam; left  anterior tibial area exposed tendon. No debridement but washed off with wound cleanser and gauze. The surface looks somewhat improved. The extending erythema that was from the wound towards her heel has resolved. There is no tenderness in this area. Electronic Signature(s) Signed: 12/22/2019 5:47:38 PM By: Baltazar Najjar MD Entered By: Baltazar Najjar on 12/22/2019 08:34:52 -------------------------------------------------------------------------------- Physician Orders Details Patient Name: Date of Service: [name] 12/22/2019 7:30 AM Medical Record [id number] Patient Account Number: [id number] Date of Birth/Sex: Treating RN: Aug 08, 1961 (59 y.o. 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 Dressing Change Frequency Wound #2 Left,Anterior Lower Leg Change Dressing every other day. Skin Barriers/Peri-Wound Care TCA Cream or Ointment - triamcinolone cream to periwound and thin layer over wound bed. Wound Cleansing Wound #2 Left,Anterior Lower Leg May shower and wash wound with soap and water. - scrub gently with gauze Primary Wound Dressing Wound #2 Left,Anterior Lower Leg Silver Collagen - promogram prisma moistened with saline Secondary Dressing Wound #2 Left,Anterior Lower Leg Kerlix/Rolled  Gauze Telfa (non adherent pad) Edema Control Avoid standing for long periods of time Elevate legs to the level of the heart or above for 30 minutes daily and/or when sitting, a frequency of: - throughout the day Exercise regularly Electronic Signature(s) Signed: 12/22/2019 5:39:56 PM By: Yevonne Pax RN Signed: 12/22/2019 5:47:38 PM By: Baltazar Najjar MD Entered By: Yevonne Pax on 12/22/2019 08:25:55 -------------------------------------------------------------------------------- Problem List Details Patient Name: Date of Service: [name] 12/22/2019 7:30 AM Medical Record [id number] Patient Account Number: [id number] Date of Birth/Sex: Treating RN: 01-Jul-1961 (59 y.o. Freddy Finner Primary Care Provider: Docia Chuck, Dibas Other Clinician: Referring Provider: Treating Provider/Extender:Nigil Braman, Effie Shy, Dibas Weeks in Treatment: 11 Active Problems ICD-10 Evaluated Encounter Code Description Active Date Today Diagnosis I87.2 Venous insufficiency (chronic) (peripheral) 09/30/2019 No Yes L97.822 Non-pressure chronic ulcer of other part of left lower 09/30/2019 No Yes leg with fat layer exposed L03.116 Cellulitis of left lower limb 12/17/2019 No Yes L97.828 Non-pressure chronic ulcer of other part of left lower 09/30/2019 No Yes leg with other specified severity 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 09/30/2019 No Yes I73.00 Raynaud's syndrome without gangrene 09/30/2019 No Yes 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 Freddy Finner Primary Care Provider: Docia Chuck, Dibas Other Clinician: Referring Provider: Treating Provider/Extender:Nocholas Damaso, Effie Shy, Dibas Weeks in Treatment: 11 Subjective History of Present Illness (HPI) 09/30/2019 upon evaluation today patient presents for initial inspection here in our clinic concerning issues that she has been experiencing with a wound on her left anterior lower leg. She unfortunately had a significant laceration which required 19 sutures and she shows me the pictures both before as well as after sutured in a very good job was done in this regard. Unfortunately the skin did not take and necrosis. Subsequently this wound open and she has been having more significant issues with that since. Fortunately there is no evidence of active infection at this time which is good news. No fever chills noted. She has a history of chronic venous stasis but is no longer able to wear any compression in fact she cannot even wear regular socks due to her neuropathy and the pain that she experiences in her feet if she does. For that reason she tries to elevate her legs as much as she can at nighttime but again she really cannot wear any type of compression. With regard to antibiotic it does not appear that she has been on anything recently. 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 Fortunately there is no evidence of local or systemic infection. No fevers, chills, nausea, vomiting, or diarrhea. 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 10/07/2019 on evaluation today patient presents for follow-up concerning her wound on the left lower extremity. She has been tolerating the dressing changes without  complication. With that being said she just got the Santyl 2 days ago. Obviously it has not had enough time to really do what we need to do she would like to obviously give this some time before proceeding with more aggressive debridement or otherwise. 10/21/2019 upon evaluation today patient appears to be doing better with regard to the overall appearance of her wound is not quite as dry I do believe the Santyl has helped her improve to some degree here. Fortunately there is no signs of active infection at this time. I think we may be able to clean off the wound with a little bit of additional debridement today and likely we can transition away from the Santyl to some kind of collagen type dressing. 10/28/2019 upon evaluation today patient actually appears to be doing fairly well with regard to her wound today. She has been tolerating the dressing changes we did switch to collagen and fortunately that seems to be doing well for her at this point. There does not seem to be any signs of active infection I do feel like the surface of the wound is looking better at this time as well. 11/04/2019 upon evaluation today patient appears to be doing well with regard to her wound. She is making some progress here and the wound does appear to be measuring smaller which is good news. Overall this is good to be somewhat slow but nonetheless I am encouraged by the fact that the collagen does seem to have helped her. 11/11/2019 on evaluation today patient's wound is showing some  signs of improvement. The good news is her overall trend does seem to be towards getting better compared to where she was previous. The smaller wounds along the side actually might even be healed but we can watch this 1 more week before I heal this out. Overall I feel like there is improvement here. She is still using the silver collagen. 11/18/2019 upon evaluation today patient appears to be doing about the same with regard to her wound. This  is not significantly smaller there still appears to be a lot of inflammation. For that reason I do want to see about actually applying triamcinolone to the actual wound bed to see if this can be of benefit as well. I thought about this for couple weeks but I think it may be a good thing to proceed with at this point. 11/25/2019 on evaluation today patient appears to be doing well with regard to her lower extremity ulcer. She has been tolerating the dressing changes without complication. Fortunately there is no signs of active infection at this time. No fevers, chills, nausea, vomiting, or diarrhea. 12/02/2019 on evaluation today patient appears to be doing okay with regard to her ulcer on the leg. Fortunately there is no signs of active infection at this time. No fever chills noted she has been tolerating the dressing changes without complication. With that being said overall I am pleased with the fact that there is no signs of infection but at the same time I really feel like she needs more to progress moving week to week and right now were really not seeing that. We have put in for approval for Apligraf which I think could help to move things along. With that being said we have not heard anything regarding approval at this point. 12/10/2019; patient comes in on my day although have not seen her previously. She has what was a traumatic wound on the left anterior mid tibia. Initially sutured however the area never really healed according to the patient. She has been using Santyl now using Prisma. There is exposed tendon. We have made application for Apligraf apparently this is going through [employer] still has not been approved. She arrives in clinic with erythema around the wound and 4 small circular papules superior to the wound. These are nontender and she is not complaining of any more pain. The exact cause of this is not really clear. The patient has idiopathic peripheral neuropathy  which for which she has been extensively evaluated in the past she is not a diabetic she does not have any rheumatologic problems she is aware of. She cannot wear compression because it aggravates her neuropathy 4/15; traumatic wound on the left anterior mid tibia. She arrives today with extensive erythema spreading from the wound medially towards her heel. This is tender swollen and warm compatible with cellulitis. We are using silver collagen to the wound 4/20; 5-day follow-up. Traumatic wound on the left anterior mid tibia. This is in follow-up for the cellulitis she had on her visit 5 days ago. I gave her empiric cephalexin I did not culture this. She arrives today with the erythema that we marked much improved. She has not been systemically unwell Objective Constitutional Sitting or standing Blood Pressure is within target range for patient.. Pulse regular and within target range for patient.Marland Kitchen Respirations regular, non-labored and within target range.. Temperature is normal and within the target range for the patient.Marland Kitchen Appears in no distress. Vitals Time Taken: 7:51 AM, Height: 66 in, Source: Stated,  Weight: 156 lbs, Source: Stated, BMI: 25.2, Temperature: 97.8 F, Pulse: 78 bpm, Respiratory Rate: 18 breaths/min, Blood Pressure: 129/79 mmHg. Cardiovascular Pedal pulses are palpable. General Notes: Wound exam; left anterior tibial area exposed tendon. No debridement but washed off with wound cleanser and gauze. The surface looks somewhat improved. The extending erythema that was from the wound towards her heel has resolved. There is no tenderness in this area. Integumentary (Hair, Skin) Wound #2 status is Open. Original cause of wound was Trauma. The wound is located on the Left,Anterior Lower Leg. The wound measures 5.3cm length x 3.9cm width x 0.3cm depth; 16.234cm^2 area and 4.87cm^3 volume. There is muscle, tendon, and Fat Layer (Subcutaneous Tissue) Exposed exposed. There is no  tunneling or undermining noted. There is a medium amount of serous drainage noted. The wound margin is epibole. There is small (1-33%) pink granulation within the wound bed. There is a large (67-100%) amount of necrotic tissue within the wound bed including Adherent Slough. 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 Dressing Change Frequency: Wound #2 Left,Anterior Lower Leg: Change Dressing every other day. Skin Barriers/Peri-Wound Care: TCA Cream or Ointment - triamcinolone cream to periwound and thin layer over wound bed. Wound Cleansing: Wound #2 Left,Anterior Lower Leg: May shower and wash wound with soap and water. - scrub gently with gauze Primary Wound Dressing: Wound #2 Left,Anterior Lower Leg: Silver Collagen - promogram prisma moistened with saline Secondary Dressing: Wound #2 Left,Anterior Lower Leg: Kerlix/Rolled Gauze Telfa (non adherent pad) Edema Control: Avoid standing for long periods of time Elevate legs to the level of the heart or above for 30 minutes daily and/or when sitting, a frequency of: - throughout the day Exercise regularly #1. We continue silver collagen to the wound #2. Cellulitis that I treated empirically with cephalexin seems a lot better. I see no need for additional antibiotic she will be finished by the end of tomorrow #3. She is changing the dressing herself covering with Telfa and a small piece of stocking. She claims to have contact allergies to a lot of different components of stockings or external compression wraps.  Unfortunately she does have some edema which would otherwise benefit from some degree of compression. #4. I believe she had Apligraf put through her insurance/[employer] but we have not heard the final details on this Electronic Signature(s) Signed: 12/22/2019 5:47:38 PM By: Linton Ham MD Entered By: Linton Ham on 12/22/2019 08:37:03 -------------------------------------------------------------------------------- SuperBill Details Patient Name: Date of Service: [name] 12/22/2019 Medical Record [id number] Patient Account Number: [id number] Date of Birth/Sex: Treating RN: 09-25-1960 (59 y.o. 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, unspecified Facility Procedures CPT4 Code: [id number] Description: 34742 - WOUND CARE VISIT-LEV 3 EST PT Modifier: Quantity: 1 Physician  Procedures CPT4 Code Description: [id number] 99213 - WC PHYS LEVEL 3 - EST PT ICD-10 Diagnosis Description L03.116 Cellulitis of left lower limb L97.822 Non-pressure chronic ulcer of other part of left lower l I87.2 Venous insufficiency (chronic) (peripheral) Modifier: eg with fat layer Quantity: 1 exposed Electronic Signature(s) Signed: 12/22/2019 5:47:38 PM By: Baltazar Najjarobson, Clyde Zarrella MD Entered By: Baltazar Najjarobson, Tynlee Bayle on 12/22/2019 08:37:26

## 2019-12-22 NOTE — Progress Notes (Signed)
PATIENT: [name] DOB: 24-Apr-1961  REASON FOR VISIT: follow up HISTORY FROM: patient  HISTORY OF PRESENT ILLNESS: Today 12/23/19  [name] is a 59 year old female with history of migraines and peripheral neuropathy.  Her headaches are currently under excellent control, she has not had a headache in about 5 months.  She has Imitrex tablets and injections on hand just in case.  She sustained a laceration to her left lower leg in December, has had delayed wound healing.  Is going to the wound center, having weekly debridements.  She recently contracted cellulitis, just finished her antibiotic.  For neuropathy she remains on Carbatrol and gabapentin.  She will occasionally take hydrocodone at night to help her rest.  She also receives B12 injection prescription from this office, her primary doctor follows levels, Dr. Sandria Manly has had her on these.  She presents today for evaluation unaccompanied.   HISTORY [name] is a 59 year old female with history of migraines and peripheral neuropathy.  She indicates her migraines are under good control, she rarely has to take Imitrex.  She has Imitrex tablets, and Imitrex injections for prolonged headaches.  She has peripheral neuropathy pain in her feet and lower legs, that is bothersome when she gets home from work.  She is a Biomedical scientist.  She remains on Carbatrol 200 mg twice daily, gabapentin 300 mg at 5 PM and 600 mg at bedtime.  She will take hydrocodone occasionally at night to help her rest.  She has not had any recent falls.  She does have history of low B12, that our office has been prescribing B12 injections.  Her primary care doctor has been managing her B12 levels.  She had a recent level checked in September that was 478.  She indicates her overall health has been well and that her pain is adequately controlled.  She presents today for follow-up unaccompanied.   REVIEW OF SYSTEMS: Out of a complete 14 system review of  symptoms, the patient complains only of the following symptoms, and all other reviewed systems are negative.  wound  ALLERGIES: Allergies  Allergen Reactions  . Doxycycline Hyclate   . Lyrica [Pregabalin]   . Vivelle-Dot [Estradiol]     Adhesive on patch  . Aleve [Naproxen Sodium]     swelling  . Adhesive  [Tape] Rash  . Ibuprofen     HOME MEDICATIONS: Outpatient Medications Prior to Visit  Medication Sig Dispense Refill  . ALPRAZolam (XANAX) 0.25 MG tablet Take 0.25 mg by mouth at bedtime as needed for sleep.    . carbamazepine (CARBATROL) 200 MG 12 hr capsule TAKE ONE CAPSULE BY MOUTH TWICE A DAY 60 capsule 11  . Cholecalciferol (VITAMIN D3) 2000 UNITS TABS Take 2,000 Units by mouth daily.    . cyanocobalamin (,VITAMIN B-12,) 1000 MCG/ML injection INJECT 1 ML INTRAMUSCULARLY EVERY 30 DAYS AS DIRECTED 10 mL 1  . EVAMIST 1.53 MG/SPRAY transdermal spray Place 1.53 mg onto the skin daily.    Marland Kitchen gabapentin (NEURONTIN) 300 MG capsule TAKE 1 CAPSULE BY MOUTH AT 5PM AND TAKE TAKE TWO CAPSULES BY MOUTH EVERY NIGHT AT BEDTIME 270 capsule 3  . HYDROcodone-acetaminophen (NORCO/VICODIN) 5-325 MG tablet Take 1 tablet by mouth every 6 (six) hours as needed. Must last 28 days. 90 tablet 0  . levothyroxine (SYNTHROID, LEVOTHROID) 75 MCG tablet Take 75 mcg by mouth daily before breakfast.    . Pediatric Multivit-Minerals-C (MULTIVITAMIN GUMMIES CHILDRENS) CHEW Chew 1 tablet by mouth daily.    Marland Kitchen  progesterone (PROMETRIUM) 200 MG capsule Take 200 mg by mouth. Take 5 tablets at bedtime every four months.    . SUMAtriptan (IMITREX) 100 MG tablet Take 1 tablet (100 mg total) by mouth 2 (two) times daily as needed for up to 1 dose for migraine. 10 tablet 2  . SUMAtriptan (IMITREX) 6 MG/0.5ML SOLN injection Inject 0.5 mLs (6 mg total) into the skin 2 (two) times daily as needed for migraine or headache. 6 vial 3   No facility-administered medications prior to visit.    PAST MEDICAL HISTORY: Past Medical  History:  Diagnosis Date  . Ankle fracture   . Ankle fracture, left   . Glaucoma    narrow angle  . Migraines   . Myeloperoxidase deficiency (HCC)   . Periodic limb movement disorder 04/08/2015  . Peripheral neuropathy    Right  . Raynaud disease 04/24/2013  . Vitamin B12 deficiency     PAST SURGICAL HISTORY: Past Surgical History:  Procedure Laterality Date  . dental implants     x3  . gastrocsoleus biopsy    . KNEE SURGERY Left   . lip biopsy    . SURAL NERVE BIOPSY      FAMILY HISTORY: Family History  Problem Relation Age of Onset  . Migraines Mother   . Cancer Mother   . Aneurysm Father   . Parkinsonism Maternal Grandfather   . Cancer Maternal Grandfather   . Heart disease Maternal Grandfather   . Heart disease Paternal Grandfather   . Stroke Paternal Grandfather     SOCIAL HISTORY: Social History   Socioeconomic History  . Marital status: Married    Spouse name: Ed  . Number of children: 0  . Years of education: 64  . Highest education level: Not on file  Occupational History  . Occupation: Print production planner  Tobacco Use  . Smoking status: Never Smoker  . Smokeless tobacco: Never Used  Substance and Sexual Activity  . Alcohol use: Yes    Alcohol/week: 0.0 standard drinks    Comment: occasionally  . Drug use: No  . Sexual activity: Not on file  Other Topics Concern  . Not on file  Social History Narrative   Lives at home, married   Patient drinks about 2 cups of caffeine daily.   Patient is right handed.   Social Determinants of Health   Financial Resource Strain:   . Difficulty of Paying Living Expenses:   Food Insecurity:   . Worried About Programme researcher, broadcasting/film/video in the Last Year:   . Barista in the Last Year:   Transportation Needs:   . Freight forwarder (Medical):   Marland Kitchen Lack of Transportation (Non-Medical):   Physical Activity:   . Days of Exercise per Week:   . Minutes of Exercise per Session:   Stress:   . Feeling of Stress :    Social Connections:   . Frequency of Communication with Friends and Family:   . Frequency of Social Gatherings with Friends and Family:   . Attends Religious Services:   . Active Member of Clubs or Organizations:   . Attends Banker Meetings:   Marland Kitchen Marital Status:   Intimate Partner Violence:   . Fear of Current or Ex-Partner:   . Emotionally Abused:   Marland Kitchen Physically Abused:   . Sexually Abused:    PHYSICAL EXAM  Vitals:   12/23/19 1528  BP: (!) 135/95  Pulse: 71  Temp: (!) 97 F (36.1 C)  Weight: 152 lb (68.9 kg)  Height: 5\' 6"  (1.676 m)   Body mass index is 24.53 kg/m.  Generalized: Well developed, in no acute distress   Neurological examination  Mentation: Alert oriented to time, place, history taking. Follows all commands speech and language fluent Cranial nerve II-XII: Pupils were equal round reactive to light. Extraocular movements were full, visual field were full on confrontational test. Facial sensation and strength were normal. Head turning and shoulder shrug  were normal and symmetric. Motor: The motor testing reveals 5 over 5 strength of all 4 extremities. Good symmetric motor tone is noted throughout. Has wound to left lower leg, swelling left foot Sensory: Decreased sensation to soft touch to mid shin bilaterally Coordination: Cerebellar testing reveals good finger-nose-finger and heel-to-shin bilaterally.  Gait and station: Gait is normal. Tandem gait is slightly unsteady. Romberg is negative. No drift is seen.  Reflexes: Deep tendon reflexes are symmetric and normal bilaterally.   DIAGNOSTIC DATA (LABS, IMAGING, TESTING) - I reviewed patient records, labs, notes, testing and imaging myself where available.  Lab Results  Component Value Date   WBC 5.9 12/04/2018   HGB 14.3 12/04/2018   HCT 42.2 12/04/2018   MCV 93 12/04/2018   PLT 295 12/04/2018      Component Value Date/Time   NA 140 12/04/2018 1542   K 4.7 12/04/2018 1542   CL 100  12/04/2018 1542   CO2 28 12/04/2018 1542   GLUCOSE 80 12/04/2018 1542   GLUCOSE 98 10/28/2008 1454   BUN 8 12/04/2018 1542   CREATININE 0.86 12/04/2018 1542   CALCIUM 9.4 12/04/2018 1542   PROT 7.2 12/04/2018 1542   ALBUMIN 4.6 12/04/2018 1542   AST 28 12/04/2018 1542   ALT 22 12/04/2018 1542   ALKPHOS 54 12/04/2018 1542   BILITOT 0.2 12/04/2018 1542   GFRNONAA 76 12/04/2018 1542   GFRAA 87 12/04/2018 1542   No results found for: CHOL, HDL, LDLCALC, LDLDIRECT, TRIG, CHOLHDL No results found for: HGBA1C No results found for: VITAMINB12 Lab Results  Component Value Date   TSH 10.760 (H) 04/24/2013      ASSESSMENT AND PLAN 59 y.o. year old female  has a past medical history of Ankle fracture, Ankle fracture, left, Glaucoma, Migraines, Myeloperoxidase deficiency ([place]), Periodic limb movement disorder (04/08/2015), Peripheral neuropathy, Raynaud disease (04/24/2013), and Vitamin B12 deficiency. here with:  1.  Migraine headaches 2.  Peripheral neuropathy  Unfortunately, she is dealing with a wound to her left lower leg, going to the wound center weekly.  Her headaches remain under good control.  For neuropathy, she will remain on Carbatrol, gabapentin, and occasionally takes hydrocodone.  I will check blood work today.  She receives a prescription for B12 injections from this office, Dr. Erling Cruz started this and we continued it.  Her PCP checks her levels. Her PCP make take this over in the future.  She will follow-up in 6 months or sooner if needed.  I spent 20 minutes of face-to-face and non-face-to-face time with patient.  This included previsit chart review, lab review, study review, order entry, electronic health record documentation, patient education.  Butler Denmark, AGNP-C, DNP 12/23/2019, 4:58 PM [facility] [address], [place], [place] [address] [phone]

## 2019-12-22 NOTE — Progress Notes (Signed)
KATHERLEEN, FOLKES ([id number]) Visit Report for 12/22/2019 Arrival Information Details Patient Name: Date of Service: MOLLI, GETHERS 12/22/2019 7:30 AM Medical Record [id number] Patient Account Number: [id number] Date of Birth/Sex: Treating RN: [birth year] (59 y.o. [name] Primary Care Agustina Witzke: [name], Dibas Other Clinician: Referring Aniruddh Ciavarella: Treating Asuna Peth/Extender:Robson, Rito Ehrlich, Dibas Weeks in Treatment: 11 Visit Information History Since Last Visit Added or deleted any medications: No Patient Arrived: Ambulatory Any new allergies or adverse reactions: No Arrival Time: 07:48 Had a fall or experienced change in No Accompanied By: self activities of daily living that may affect Transfer Assistance: None risk of falls: Patient Identification Verified: Yes Signs or symptoms of abuse/neglect since last No Secondary Verification Process Completed: Yes visito Patient Requires Transmission-Based No Hospitalized since last visit: No Precautions: Implantable device outside of the clinic excluding No Patient Has Alerts: Yes cellular tissue based products placed in the center since last visit: Has Dressing in Place as Prescribed: Yes Pain Present Now: Yes Electronic Signature(s) Signed: 12/22/2019 6:13:22 PM By: Baruch Gouty RN, BSN Entered By: Baruch Gouty on 12/22/2019 07:51:31 -------------------------------------------------------------------------------- Clinic Level of Care Assessment Details Patient Name: Date of Service: JENASIS, STRALEY 12/22/2019 7:30 AM Medical Record [id number] Patient Account Number: [id number] Date of Birth/Sex: Treating RN: 30-May-1961 (59 y.o. [name] Primary Care Kemarion Abbey: [name], Dibas Other Clinician: Referring Aela Bohan: Treating Azrielle Springsteen/Extender:Robson, Rito Ehrlich, Dibas Weeks in Treatment: 11 Clinic Level of Care Assessment Items TOOL 4 Quantity Score X - Use when only an EandM  is performed on FOLLOW-UP visit 1 0 ASSESSMENTS - Nursing Assessment / Reassessment X - Reassessment of Co-morbidities (includes updates in patient status) 1 10 X - Reassessment of Adherence to Treatment Plan 1 5 ASSESSMENTS - Wound and Skin Assessment / Reassessment X - Simple Wound Assessment / Reassessment - one wound 1 5 []  - Complex Wound Assessment / Reassessment - multiple wounds 0 []  - Dermatologic / Skin Assessment (not related to wound area) 0 ASSESSMENTS - Focused Assessment []  - Circumferential Edema Measurements - multi extremities 0 []  - Nutritional Assessment / Counseling / Intervention 0 []  - Lower Extremity Assessment (monofilament, tuning fork, pulses) 0 []  - Peripheral Arterial Disease Assessment (using hand held doppler) 0 ASSESSMENTS - Ostomy and/or Continence Assessment and Care []  - Incontinence Assessment and Management 0 []  - Ostomy Care Assessment and Management (repouching, etc.) 0 PROCESS - Coordination of Care X - Simple Patient / Family Education for ongoing care 1 15 []  - Complex (extensive) Patient / Family Education for ongoing care 0 X - Staff obtains Programmer, systems, Records, Test Results / Process Orders 1 10 []  - Staff telephones HHA, Nursing Homes / Clarify orders / etc 0 []  - Routine Transfer to another Facility (non-emergent condition) 0 []  - Routine Hospital Admission (non-emergent condition) 0 []  - New Admissions / Biomedical engineer / Ordering NPWT, Apligraf, etc. 0 []  - Emergency Hospital Admission (emergent condition) 0 X - Simple Discharge Coordination 1 10 []  - Complex (extensive) Discharge Coordination 0 PROCESS - Special Needs []  - Pediatric / Minor Patient Management 0 []  - Isolation Patient Management 0 []  - Hearing / Language / Visual special needs 0 []  - Assessment of Community assistance (transportation, D/C planning, etc.) 0 []  - Additional assistance / Altered mentation 0 []  - Support Surface(s) Assessment (bed, cushion, seat,  etc.) 0 INTERVENTIONS - Wound Cleansing / Measurement X - Simple Wound Cleansing - one wound 1 5 []  - Complex Wound Cleansing - multiple wounds 0 X - Wound Imaging (  photographs - any number of wounds) 1 5 []  - Wound Tracing (instead of photographs) 0 X - Simple Wound Measurement - one wound 1 5 []  - Complex Wound Measurement - multiple wounds 0 INTERVENTIONS - Wound Dressings []  - Small Wound Dressing one or multiple wounds 0 X - Medium Wound Dressing one or multiple wounds 1 15 []  - Large Wound Dressing one or multiple wounds 0 []  - Application of Medications - topical 0 []  - Application of Medications - injection 0 INTERVENTIONS - Miscellaneous []  - External ear exam 0 []  - Specimen Collection (cultures, biopsies, blood, body fluids, etc.) 0 []  - Specimen(s) / Culture(s) sent or taken to Lab for analysis 0 []  - Patient Transfer (multiple staff / Civil Service fast streamer / Similar devices) 0 []  - Simple Staple / Suture removal (25 or less) 0 []  - Complex Staple / Suture removal (26 or more) 0 []  - Hypo / Hyperglycemic Management (close monitor of Blood Glucose) 0 []  - Ankle / Brachial Index (ABI) - do not check if billed separately 0 X - Vital Signs 1 5 Has the patient been seen at the hospital within the last three years: Yes Total Score: 90 Level Of Care: New/Established - Level 3 Electronic Signature(s) Signed: 12/22/2019 5:39:56 PM By: Carlene Coria RN Entered By: Carlene Coria on 12/22/2019 08:27:03 -------------------------------------------------------------------------------- Encounter Discharge Information Details Patient Name: Date of Service: [name] 12/22/2019 7:30 AM Medical Record [id number] Patient Account Number: [id number] Date of Birth/Sex: Treating RN: [birth year] (59 y.o. Clearnce Sorrel Primary Care Jammi Morrissette: [name], Dibas Other Clinician: Referring Christoph Copelan: Treating Tanda Morrissey/Extender:Robson, Rito Ehrlich, Dibas Weeks in Treatment: 11 Encounter  Discharge Information Items Discharge Condition: Stable Ambulatory Status: Ambulatory Discharge Destination: Home Transportation: Private Auto Accompanied By: self Schedule Follow-up Appointment: Yes Clinical Summary of Care: Patient Declined Electronic Signature(s) Signed: 12/22/2019 5:57:12 PM By: Kela Millin Entered By: Kela Millin on 12/22/2019 08:37:09 -------------------------------------------------------------------------------- Lower Extremity Assessment Details Patient Name: Date of Service: LAQUETA, BONAVENTURA 12/22/2019 7:30 AM Medical Record [id number] Patient Account Number: [id number] Date of Birth/Sex: Treating RN: 04-21-1961 (59 y.o. [name] Primary Care Shelbie Franken: [name], Dibas Other Clinician: Referring Nimah Uphoff: Treating Nailani Full/Extender:Robson, Rito Ehrlich, Dibas Weeks in Treatment: 11 Edema Assessment Assessed: [Left: No] [Right: No] Edema: [Left: Ye] [Right: s] Calf Left: Right: Point of Measurement: 29 cm From Medial Instep 35 cm cm Ankle Left: Right: Point of Measurement: 8 cm From Medial Instep 23.1 cm cm Vascular Assessment Pulses: Dorsalis Pedis Palpable: [Left:No] Electronic Signature(s) Signed: 12/22/2019 6:13:22 PM By: Baruch Gouty RN, BSN Entered By: Baruch Gouty on 12/22/2019 07:54:48 -------------------------------------------------------------------------------- Multi Wound Chart Details Patient Name: Date of Service: [name] 12/22/2019 7:30 AM Medical Record [id number] Patient Account Number: [id number] Date of Birth/Sex: Treating RN: 01-19-1961 (59 y.o. [name] Primary Care Shimeka Bacot: [name], Dibas Other Clinician: Referring Anuja Manka: Treating Lamoyne Palencia/Extender:Robson, Rito Ehrlich, Dibas Weeks in Treatment: 11 Vital Signs Height(in): 66 Pulse(bpm): 78 Weight(lbs): 156 Blood Pressure(mmHg): 129/79 Body Mass Index(BMI): 25 Temperature(F): 97.8 Respiratory  18 Rate(breaths/min): Photos: [2:No Photos] [N/A:N/A] Wound Location: [2:Left, Anterior Lower Leg] [N/A:N/A] Wounding Event: [2:Trauma] [N/A:N/A] Primary Etiology: [2:Venous Leg Ulcer] [N/A:N/A] Comorbid History: [2:Lymphedema, Raynauds, N/A Neuropathy] Date Acquired: [2:08/06/2019] [N/A:N/A] Weeks of Treatment: [2:11] [N/A:N/A] Wound Status: [2:Open] [N/A:N/A] Measurements L x W x D 5.3x3.9x0.3 [N/A:N/A] (cm) Area (cm) : [2:16.234] [N/A:N/A] Volume (cm) : [2:4.87] [N/A:N/A] % Reduction in Area: [2:11.00%] [N/A:N/A] % Reduction in Volume: -33.50% [N/A:N/A] Classification: [2:Full Thickness With Exposed Support Structures] [N/A:N/A] Exudate Amount: [2:Medium] [N/A:N/A] Exudate  Type: [2:Serous] [N/A:N/A] Exudate Color: [2:amber] [N/A:N/A] Wound Margin: [2:Epibole] [N/A:N/A] Granulation Amount: [2:Small (1-33%)] [N/A:N/A] Granulation Quality: [2:Pink] [N/A:N/A] Necrotic Amount: [2:Large (67-100%)] [N/A:N/A] Exposed Structures: [2:Fat Layer (Subcutaneous N/A Tissue) Exposed: Yes Tendon: Yes Muscle: Yes Fascia: No Joint: No Bone: No None] [N/A:N/A] Treatment Notes Electronic Signature(s) Signed: 12/22/2019 5:39:56 PM By: Carlene Coria RN Signed: 12/22/2019 5:47:38 PM By: Linton Ham MD Entered By: Linton Ham on 12/22/2019 08:32:42 -------------------------------------------------------------------------------- Multi-Disciplinary Care Plan Details Patient Name: Date of Service: [name] 12/22/2019 7:30 AM Medical Record [id number] Patient Account Number: [id number] Date of Birth/Sex: Treating RN: December 02, 1960 (59 y.o. [name] Primary Care Ramesses Crampton: [name], Dibas Other Clinician: Referring Avagail Whittlesey: Treating Ayvion Kavanagh/Extender:Robson, Rito Ehrlich, Dibas Weeks in Treatment: 11 Active Inactive Venous Leg Ulcer Nursing Diagnoses: Knowledge deficit related to disease process and management Potential for venous Insuffiency (use before diagnosis  confirmed) Goals: Patient will maintain optimal edema control Date Initiated: 09/30/2019 Target Resolution Date: 01/01/2020 Goal Status: Active Patient/caregiver will verbalize understanding of disease process and disease management Date Initiated: 09/30/2019 Date Inactivated: 11/25/2019 Target Resolution Date: 11/25/2019 Goal Status: Met Interventions: Assess peripheral edema status every visit. Compression as ordered Provide education on venous insufficiency Treatment Activities: Therapeutic compression applied : 09/30/2019 Notes: Wound/Skin Impairment Nursing Diagnoses: Impaired tissue integrity Knowledge deficit related to ulceration/compromised skin integrity Goals: Patient/caregiver will verbalize understanding of skin care regimen Date Initiated: 09/30/2019 Target Resolution Date: 12/31/2019 Goal Status: Active Ulcer/skin breakdown will have a volume reduction of 30% by week 4 Date Initiated: 09/30/2019 Date Inactivated: 10/28/2019 Target Resolution Date: 10/28/2019 Unmet Reason: other Goal Status: Unmet comorbidities Interventions: Assess patient/caregiver ability to obtain necessary supplies Assess patient/caregiver ability to perform ulcer/skin care regimen upon admission and as needed Assess ulceration(s) every visit Provide education on ulcer and skin care Treatment Activities: Skin care regimen initiated : 09/30/2019 Topical wound management initiated : 09/30/2019 Notes: Electronic Signature(s) Signed: 12/22/2019 5:39:56 PM By: Carlene Coria RN Entered By: Carlene Coria on 12/22/2019 07:52:10 -------------------------------------------------------------------------------- Pain Assessment Details Patient Name: Date of Service: LACHAE, HOHLER 12/22/2019 7:30 AM Medical Record [id number] Patient Account Number: [id number] Date of Birth/Sex: Treating RN: 05-10-1961 (59 y.o. [name] Primary Care Airam Heidecker: [name], Dibas Other Clinician: Referring  Mattisen Pohlmann: Treating Kambrea Carrasco/Extender:Robson, Rito Ehrlich, Dibas Weeks in Treatment: 11 Active Problems Location of Pain Severity and Description of Pain Patient Has Paino Yes Site Locations Pain Location: Generalized Pain, Pain in Ulcers With Dressing Change: Yes Duration of the Pain. Constant / Intermittento Constant Rate the pain. Current Pain Level: 2 Worst Pain Level: 6 Least Pain Level: 1 Character of Pain Describe the Pain: Aching, Stabbing, Throbbing Pain Management and Medication Current Pain Management: Cold Application: Yes Is the Current Pain Management Adequate: Adequate How does your wound impact your activities of daily livingo Sleep: Yes Bathing: No Appetite: No Relationship With Others: No Bladder Continence: No Emotions: Yes Bowel Continence: No Work: No Toileting: No Drive: No Dressing: No Hobbies: No Electronic Signature(s) Signed: 12/22/2019 6:13:22 PM By: Baruch Gouty RN, BSN Entered By: Baruch Gouty on 12/22/2019 07:53:24 -------------------------------------------------------------------------------- Patient/Caregiver Education Details Patient Name: Date of Service: [name] 4/20/2021andnbsp7:30 AM Medical Record [id number] Patient Account Number: [id number] Date of Birth/Gender: Treating RN: Feb 15, 1961 (59 y.o. [name] Primary Care Physician: [name], Dibas Other Clinician: Referring Physician: Treating Physician/Extender:Robson, Rito Ehrlich, Dibas Weeks in Treatment: 11 Education Assessment Education Provided To: Patient Education Topics Provided Venous: Methods: Explain/Verbal Responses: State content correctly Wound/Skin Impairment: Methods: Explain/Verbal Responses: State content correctly Electronic Signature(s) Signed: 12/22/2019  5:39:56 PM By: Carlene Coria RN Entered By: Carlene Coria on 12/22/2019  07:52:28 -------------------------------------------------------------------------------- Wound Assessment Details Patient Name: Date of Service: TAIWAN, MILLON 12/22/2019 7:30 AM Medical Record [id number] Patient Account Number: [id number] Date of Birth/Sex: Treating RN: May 24, 1961 (59 y.o. [name] Primary Care Athira Janowicz: [name], Dibas Other Clinician: Referring Tommy Goostree: Treating Oris Staffieri/Extender:Robson, Rito Ehrlich, Dibas Weeks in Treatment: 11 Wound Status Wound Number: 2 Primary Etiology: Venous Leg Ulcer Wound Location: Left, Anterior Lower Leg Wound Status: Open Wounding Event: Trauma Comorbid Lymphedema, Raynauds, History: Neuropathy Date Acquired: 08/06/2019 Weeks Of Treatment: 11 Clustered Wound: No Wound Measurements Length: (cm) 5.3 % Reducti Width: (cm) 3.9 % Reducti Depth: (cm) 0.3 Epithelia Area: (cm) 16.234 Tunnelin Volume: (cm) 4.87 Undermin Wound Description Classification: Full Thickness With Exposed Support Foul Odor Structures Slough/Fi Wound Epibole Margin: Exudate Medium Amount: Exudate Type: Serous Exudate Color: amber Wound Bed Granulation Amount: Small (1-33%) Granulation Quality: Pink Fascia Ex Necrotic Amount: Large (67-100%) Fat Layer Necrotic Quality: Adherent Slough Tendon Ex Muscle Ex Necr Joint Exp Bone Expo After Cleansing: No brino Yes Exposed Structure posed: No (Subcutaneous Tissue) Exposed: Yes posed: Yes posed: Yes osis of Muscle: No osed: No sed: No on in Area: 11% on in Volume: -33.5% lization: None g: No ing: No Treatment Notes Wound #2 (Left, Anterior Lower Leg) 1. Cleanse With Wound Cleanser 2. Periwound Care TCA Cream 3. Primary Dressing Applied Colle[employer] 4. Secondary Dressing Other secondary dressing (specify in notes) Notes TCA ointment underneath prisma. secondary telfa pad. Electronic Signature(s) Signed: 12/22/2019 6:13:22 PM By: Baruch Gouty RN, BSN Entered By:  Baruch Gouty on 12/22/2019 07:59:13 -------------------------------------------------------------------------------- [place] Details Patient Name: Date of Service: [name] 12/22/2019 7:30 AM Medical Record [id number] Patient Account Number: [id number] Date of Birth/Sex: Treating RN: 08-19-1961 (59 y.o. [name] Primary Care Makar Slatter: [name], Dibas Other Clinician: Referring Mikhai Bienvenue: Treating Reyes Fifield/Extender:Robson, Rito Ehrlich, Dibas Weeks in Treatment: 11 Vital Signs Time Taken: 07:51 Temperature (F): 97.8 Height (in): 66 Pulse (bpm): 78 Source: Stated Respiratory Rate (breaths/min): 18 Weight (lbs): 156 Blood Pressure (mmHg): 129/79 Source: Stated Reference Range: 80 - 120 mg / dl Body Mass Index (BMI): 25.2 Electronic Signature(s) Signed: 12/22/2019 6:13:22 PM By: Baruch Gouty RN, BSN Entered By: Baruch Gouty on 12/22/2019 07:52:10

## 2019-12-23 ENCOUNTER — Encounter: Payer: Self-pay | Admitting: Neurology

## 2019-12-23 ENCOUNTER — Ambulatory Visit: Payer: 59 | Admitting: Neurology

## 2019-12-23 VITALS — BP 135/95 | HR 71 | Temp 97.0°F | Ht 66.0 in | Wt 152.0 lb

## 2019-12-23 DIAGNOSIS — G609 Hereditary and idiopathic neuropathy, unspecified: Secondary | ICD-10-CM | POA: Diagnosis not present

## 2019-12-23 DIAGNOSIS — G43009 Migraine without aura, not intractable, without status migrainosus: Secondary | ICD-10-CM

## 2019-12-23 NOTE — Patient Instructions (Addendum)
It was nice to see you today  Continue current medications  Check blood work today Continue to see the wound center See you back in 6 months

## 2019-12-24 ENCOUNTER — Telehealth: Payer: Self-pay | Admitting: *Deleted

## 2019-12-24 LAB — CBC WITH DIFFERENTIAL/PLATELET
Basophils Absolute: 0.1 10*3/uL (ref 0.0–0.2)
Basos: 2 %
EOS (ABSOLUTE): 0.1 10*3/uL (ref 0.0–0.4)
Eos: 1 %
Hematocrit: 42.2 % (ref 34.0–46.6)
Hemoglobin: 14.1 g/dL (ref 11.1–15.9)
Immature Grans (Abs): 0 10*3/uL (ref 0.0–0.1)
Immature Granulocytes: 0 %
Lymphocytes Absolute: 2.6 10*3/uL (ref 0.7–3.1)
Lymphs: 42 %
MCH: 31.8 pg (ref 26.6–33.0)
MCHC: 33.4 g/dL (ref 31.5–35.7)
MCV: 95 fL (ref 79–97)
Monocytes Absolute: 0.6 10*3/uL (ref 0.1–0.9)
Monocytes: 9 %
Neutrophils Absolute: 2.9 10*3/uL (ref 1.4–7.0)
Neutrophils: 46 %
Platelets: 389 10*3/uL (ref 150–450)
RBC: 4.43 x10E6/uL (ref 3.77–5.28)
RDW: 12 % (ref 11.7–15.4)
WBC: 6.3 10*3/uL (ref 3.4–10.8)

## 2019-12-24 LAB — CARBAMAZEPINE LEVEL, TOTAL: Carbamazepine (Tegretol), S: 5 ug/mL (ref 4.0–12.0)

## 2019-12-24 LAB — COMPREHENSIVE METABOLIC PANEL
ALT: 21 IU/L (ref 0–32)
AST: 27 IU/L (ref 0–40)
Albumin/Globulin Ratio: 1.6 (ref 1.2–2.2)
Albumin: 4.5 g/dL (ref 3.8–4.9)
Alkaline Phosphatase: 68 IU/L (ref 39–117)
BUN/Creatinine Ratio: 15 (ref 9–23)
BUN: 11 mg/dL (ref 6–24)
Bilirubin Total: 0.3 mg/dL (ref 0.0–1.2)
CO2: 28 mmol/L (ref 20–29)
Calcium: 9.6 mg/dL (ref 8.7–10.2)
Chloride: 101 mmol/L (ref 96–106)
Creatinine, Ser: 0.74 mg/dL (ref 0.57–1.00)
GFR calc Af Amer: 104 mL/min/{1.73_m2} (ref >59)
GFR calc non Af Amer: 90 mL/min/{1.73_m2} (ref >59)
Globulin, Total: 2.8 g/dL (ref 1.5–4.5)
Glucose: 80 mg/dL (ref 65–99)
Potassium: 5.3 mmol/L — ABNORMAL HIGH (ref 3.5–5.2)
Sodium: 140 mmol/L (ref 134–144)
Total Protein: 7.3 g/dL (ref 6.0–8.5)

## 2019-12-24 NOTE — Telephone Encounter (Signed)
Fax confirmation received to Dr. Docia Chuck [phone].

## 2019-12-24 NOTE — Telephone Encounter (Signed)
I called pt and relayed lab results per SS/NP note.  Pt made aware to see pcp for elevated K+ level.  She verbalized understanding.

## 2019-12-24 NOTE — Telephone Encounter (Signed)
-----   Message from Glean Salvo, NP sent at 12/24/2019  7:49 AM EDT ----- Labs look overall good, potassium was slightly high 5.3, unknown reason? May have this rechecked at pcp. I don't see where hemolysis was reported on the sample. Carbamazepine is therapeutic.

## 2019-12-25 NOTE — Progress Notes (Signed)
I have read the note, and I agree with the clinical assessment and plan.  Niel Peretti K Oumou Smead   

## 2019-12-30 ENCOUNTER — Other Ambulatory Visit: Payer: Self-pay

## 2019-12-30 ENCOUNTER — Encounter (HOSPITAL_BASED_OUTPATIENT_CLINIC_OR_DEPARTMENT_OTHER): Payer: Worker's Compensation | Admitting: Physician Assistant

## 2019-12-30 ENCOUNTER — Other Ambulatory Visit (HOSPITAL_COMMUNITY)
Admission: RE | Admit: 2019-12-30 | Discharge: 2019-12-30 | Disposition: A | Discharge status: Home / Self Care | Payer: 59 | Source: Other Acute Inpatient Hospital | Attending: Physician Assistant | Admitting: Physician Assistant

## 2019-12-30 DIAGNOSIS — L97822 Non-pressure chronic ulcer of other part of left lower leg with fat layer exposed: Secondary | ICD-10-CM | POA: Diagnosis not present

## 2019-12-30 NOTE — Progress Notes (Addendum)
[name], Selin B. ([id number]) Visit Report for 12/30/2019 Chief Complaint Document Details Patient Name: Date of Service: [name], Cheyenne B. 12/30/2019 8:00 A M Medical Record Number: [id number] Patient Account Number: [id number] Date of Birth/Sex: Treating RN: Mar 06, 1961 (59 y.o. Wynelle LinkF) Lynch, Shatara Primary Care Provider: Docia ChuckKoirala, Dibas Other Clinician: Referring Provider: Treating Provider/Extender: Richardo PriestStone III, Nickolai Rinks Koirala, Dibas Weeks in Treatment: 13 Information Obtained from: Patient Chief Complaint Left LE Ulcers Electronic Signature(s) Signed: 12/30/2019 8:53:14 AM By: Lenda KelpStone III, Rafik Koppel PA-C Entered By: Lenda KelpStone III, Crosby Bevan on 12/30/2019 08:53:14 -------------------------------------------------------------------------------- Debridement Details Patient Name: Date of Service: [name], Danyah B. 12/30/2019 8:00 A M Medical Record Number: [id number] Patient Account Number: [id number] Date of Birth/Sex: Treating RN: Mar 06, 1961 (59 y.o. Wynelle LinkF) Lynch, Shatara Primary Care Provider: Docia ChuckKoirala, Dibas Other Clinician: Referring Provider: Treating Provider/Extender: Richardo PriestStone III, Jodelle Fausto Koirala, Dibas Weeks in Treatment: 13 Debridement Performed for Assessment: Wound #2 Left,Anterior Lower Leg Performed By: Clinician Zandra AbtsLynch, Shatara, RN Debridement Type: Chemical/Enzymatic/Mechanical Agent Used: Santyl Severity of Tissue Pre Debridement: Other severity specified Level of Consciousness (Pre-procedure): Awake and Alert Pre-procedure Verification/Time Out No Taken: Start Time: 09:08 Bleeding: None End Time: 09:08 Procedural Pain: 0 Post Procedural Pain: 0 Response to Treatment: Procedure was tolerated well Level of Consciousness (Post- Awake and Alert procedure): Post Debridement Measurements of Total Wound Length: (cm) 5.4 Width: (cm) 3.9 Depth: (cm) 0.4 Volume: (cm) 6.616 Character of Wound/Ulcer Post Debridement: Requires Further Debridement Severity of Tissue Post Debridement: Other severity  specified Post Procedure Diagnosis Same as Pre-procedure Electronic Signature(s) Signed: 12/30/2019 5:55:37 PM By: Zandra AbtsLynch, Shatara RN, BSN Signed: 01/01/2020 9:02:44 AM By: Lenda KelpStone III, Jermiah Howton PA-C Entered By: Zandra AbtsLynch, Shatara on 12/30/2019 09:09:23 -------------------------------------------------------------------------------- HPI Details Patient Name: Date of Service: [name], Cheyenne B. 12/30/2019 8:00 A M Medical Record Number: [id number] Patient Account Number: [id number] Date of Birth/Sex: Treating RN: Mar 06, 1961 (59 y.o. Wynelle LinkF) Lynch, Shatara Primary Care Provider: Docia ChuckKoirala, Dibas Other Clinician: Referring Provider: Treating Provider/Extender: Richardo PriestStone III, Caylan Schifano Koirala, Dibas Weeks in Treatment: 13 History of Present Illness HPI Description: 09/30/2019 upon evaluation today patient presents for initial inspection here in our clinic concerning issues that she has been experiencing with a wound on her left anterior lower leg. She unfortunately had a significant laceration which required 19 sutures and she shows me the pictures both before as well as after sutured in a very good job was done in this regard. Unfortunately the skin did not take and necrosis. Subsequently this wound open and she has been having more significant issues with that since. Fortunately there is no evidence of active infection at this time which is good news. No fever chills noted. She has a history of chronic venous stasis but is no longer able to wear any compression in fact she cannot even wear regular socks due to her neuropathy and the pain that she experiences in her feet if she does. For that reason she tries to elevate her legs as much as she can at nighttime but again she really cannot wear any type of compression. With regard to antibiotic it does not appear that she has been on anything recently. 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 Fortunately there is no evidence of local or systemic infection. No fevers, chills, nausea, vomiting, or diarrhea. 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 10/07/2019 on evaluation today patient presents for follow-up concerning her wound on the left lower extremity. She has been tolerating the dressing changes without complication. With that being said she just got the Santyl 2 days ago. Obviously it has not had enough time to really do what we need to do she would like to obviously give this some time before proceeding with more aggressive debridement or otherwise. 10/21/2019 upon evaluation today patient appears to be doing better with regard to the overall appearance of her wound is not quite as dry I do believe the Santyl has helped her improve to some degree here. Fortunately there is no signs of active infection at this time. I think we may be able to clean off the wound with a little bit of additional debridement today and likely we can transition away from the Santyl to some kind of collagen type dressing. 10/28/2019 upon evaluation today patient actually appears to be doing fairly well with regard to her wound today. She has been tolerating the dressing changes we did switch to collagen and fortunately that seems to be doing well for her at this point. There does not seem to be any signs of active infection I do feel like the surface of the wound is looking better at this time as well. 11/04/2019 upon evaluation today patient appears to be doing well with regard to her wound. She is making some progress here and the wound does appear to be measuring smaller which is good news. Overall this is good to be somewhat slow but nonetheless I am encouraged by the fact that the collagen does seem to have helped her. 11/11/2019 on evaluation today  patient's wound is showing some signs of improvement. The good news is her overall trend does seem to be towards getting better compared to where she was previous. The smaller wounds along the side actually might even be healed but we can watch this 1 more week before I heal this out. Overall I feel like there is improvement here. She is still using the silver collagen. 11/18/2019 upon evaluation today patient appears to be doing about the same with regard to her wound. This is not significantly smaller there still appears to be a lot of inflammation. For that reason I do want to see about actually applying triamcinolone to the actual wound bed to see if this can be of benefit as well. I thought about this for couple weeks but I think it may be a good thing to proceed with at this point. 11/25/2019 on evaluation today patient appears to be doing well with regard to her lower extremity ulcer. She has been tolerating the dressing changes without complication. Fortunately there is no signs of active infection at this time. No fevers, chills, nausea, vomiting, or diarrhea. 12/02/2019 on evaluation today patient appears to be doing okay with regard to her ulcer on the leg. Fortunately there is no signs of active infection at this time. No fever chills noted she has been tolerating the dressing changes without complication. With that being said overall I am pleased with the fact that there is no signs of infection but at the same time I really feel like she needs more to progress moving week to week and right now were really not seeing that. We have put in for approval for Apligraf which I think could help to move things along. With that being  said we have not heard anything regarding approval at this point. 12/10/2019; patient comes in on my day although have not seen her previously. She has what was a traumatic wound on the left anterior mid tibia. Initially sutured however the area never really healed according  to the patient. She has been using Santyl now using Prisma. There is exposed tendon. We have made application for Apligraf apparently this is going through [employer] still has not been approved. She arrives in clinic with erythema around the wound and 4 small circular papules superior to the wound. These are nontender and she is not complaining of any more pain. The exact cause of this is not really clear. The patient has idiopathic peripheral neuropathy which for which she has been extensively evaluated in the past she is not a diabetic she does not have any rheumatologic problems she is aware of. She cannot wear compression because it aggravates her neuropathy 4/15; traumatic wound on the left anterior mid tibia. She arrives today with extensive erythema spreading from the wound medially towards her heel. This is tender swollen and warm compatible with cellulitis. We are using silver collagen to the wound 4/20; 5-day follow-up. Traumatic wound on the left anterior mid tibia. This is in follow-up for the cellulitis she had on her visit 5 days ago. I gave her empiric cephalexin I did not culture this. She arrives today with the erythema that we marked much improved. She has not been systemically unwell Upon evaluation today patient's wound unfortunately is not doing very well this in fact is somewhat deeper than even last week4/28/2021 which is unfortunate. I am very concerned about the fact that to be honest that the patient does not seem to be doing as good as we would have expected in the past 2 weeks since I last saw her. Fortunately there is no evidence of active infection systemically which is good news. No fevers, chills, nausea, vomiting, or diarrhea. She has been on Keflex. Electronic Signature(s) Signed: 12/30/2019 9:04:07 AM By: Lenda Kelp PA-C Entered By: Lenda Kelp on 12/30/2019  09:04:06 -------------------------------------------------------------------------------- Physical Exam Details Patient Name: Date of Service: METZLI, POLLICK 12/30/2019 8:00 A M Medical Record Number: [id number] Patient Account Number: [id number] Date of Birth/Sex: Treating RN: October 09, 1960 (59 y.o. Wynelle Link Primary Care Provider: Docia Chuck, Dibas Other Clinician: Referring Provider: Treating Provider/Extender: Richardo Priest, Dibas Weeks in Treatment: 13 Constitutional Well-nourished and well-hydrated in no acute distress. Respiratory normal breathing without difficulty. Psychiatric this patient is able to make decisions and demonstrates good insight into disease process. Alert and Oriented x 3. pleasant and cooperative. Notes Upon inspection patient's wound bed actually showed signs of good granulation at this time. Fortunately there is no evidence of active infection which is great news. No fevers, chills, nausea, vomiting, or diarrhea. Overall I still feel like the patient may have some cellulitis going on here. She has been approved for the Apligraf but at this point I think that this is a moot point as we have to get this infection under control before we can even consider utilization of that. I think also the patient needs some cleaning up with regard to the wound bed I think Santyl would be appropriate in this regard she still has some leftover from previous. Electronic Signature(s) Signed: 12/30/2019 9:07:07 AM By: Lenda Kelp PA-C Entered By: Lenda Kelp on 12/30/2019 09:07:06 -------------------------------------------------------------------------------- Physician Orders Details Patient Name: Date of Service: Cheyenne Aran B. 12/30/2019 8:00 A  M Medical Record Number: [id number] Patient Account Number: [id number] Date of Birth/Sex: Treating RN: 1961-07-22 (59 y.o. 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 Dressing Change Frequency Wound #2 Left,Anterior Lower Leg Change dressing every day. Wound Cleansing Wound #2 Left,Anterior Lower Leg May shower and wash wound with soap and water. - scrub gently with gauze Primary Wound Dressing Wound #2 Left,Anterior Lower Leg Santyl Ointment Secondary Dressing Wound #2 Left,Anterior Lower Leg Kerlix/Rolled Gauze Telfa (non adherent pad) Edema Control Avoid standing for long periods of time Elevate legs to the level of the heart or above for 30 minutes daily and/or when sitting, a frequency of: - throughout the day Exercise regularly Laboratory naerobe culture (MICRO) - left anterior lower leg - (ICD10 V76.160 - Non-pressure chronic ulcer Bacteria identified in Unspecified specimen by A of other part of left lower leg with fat layer exposed) LOINC Code: 737-1 Convenience Name: Anerobic culture Electronic Signature(s) Signed: 12/30/2019 5:55:37 PM By: Levan Hurst RN, BSN Signed: 01/01/2020 9:02:44 AM By: Worthy Keeler PA-C Entered By: Levan Hurst on 12/30/2019 09:05:13 -------------------------------------------------------------------------------- Problem List Details Patient Name: Date of Service: [name] 12/30/2019 8:00 A M Medical Record Number: [id number] Patient Account Number:  [id number] Date of Birth/Sex: Treating RN: [birth year] (59 y.o. 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 12/30/2019 8:00 A M Medical Record Number: [id number] Patient Account Number: [id number] Date of Birth/Sex: Treating RN: 31-Jul-1961 (60 y.o. [name] Primary Care Provider: [name], Dibas Other Clinician: Referring Provider: Treating Provider/Extender: Dayna Ramus, Dibas Weeks in Treatment: 13 Subjective Chief Complaint Information obtained from Patient Left LE Ulcers History of Present Illness (HPI) 09/30/2019 upon evaluation today patient presents for initial inspection here in our clinic concerning issues that she has been experiencing with a wound on her left anterior lower leg. She unfortunately had a significant laceration which required 19 sutures and she shows me the pictures both before as well as  after sutured in a very good job was done in this regard. Unfortunately the skin did not take and necrosis. Subsequently this wound open and she has been having more significant issues with that since. Fortunately there  is no evidence of active infection at this time which is good news. No fever chills noted. She has a history of chronic venous stasis but is no longer able to wear any compression in fact she cannot even wear regular socks due to her neuropathy and the pain that she experiences in her feet if she does. For that reason she tries to elevate her legs as much as she can at nighttime but again she really cannot wear any type of compression. With regard to antibiotic it does not appear that she has been on anything recently. 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 Fortunately there is no evidence of local or systemic infection. No fevers, chills, nausea, vomiting, or diarrhea. 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 10/07/2019 on evaluation today patient presents for follow-up concerning her wound on the left lower extremity. She has been tolerating the dressing changes without complication. With that being said she just got the Santyl 2 days ago. Obviously it has not had enough time to really do what we need to do she would like to obviously give this some time before proceeding with more aggressive debridement or otherwise. 10/21/2019 upon evaluation today patient appears to be doing better with regard to the overall appearance of her wound is not quite as dry I do believe the Santyl has helped her improve to some degree here. Fortunately there is no signs of active infection at this time. I think we may be able to clean off the wound with a little  bit of additional debridement today and likely we can transition away from the Santyl to some kind of collagen type dressing. 10/28/2019 upon evaluation today patient actually appears to be doing fairly well with regard to her wound today. She has been tolerating the dressing changes we did switch to collagen and fortunately that seems to be doing well for her at this point. There does not seem to be any signs of active infection I do feel like the surface of the wound is looking better at this time as well. 11/04/2019 upon evaluation today patient appears to be doing well with regard to her wound. She is making some progress here and the wound does appear to be measuring smaller which is good news. Overall this is good to be somewhat slow but nonetheless I am encouraged by the fact that the collagen does seem to have helped her. 11/11/2019 on evaluation today patient's wound is showing some signs of improvement. The good news is her overall trend does seem to be towards getting better compared to where she was previous. The smaller wounds along the side actually might even be healed but we can watch this 1 more week before I heal this out. Overall I feel like there is improvement here. She is still using the silver collagen. 11/18/2019 upon evaluation today patient appears to be doing about the same with regard to her wound. This is not significantly smaller there still appears to be a lot of inflammation. For that reason I do want to see about actually applying triamcinolone to the actual wound bed to see if this can be of benefit as well. I thought about this  for couple weeks but I think it may be a good thing to proceed with at this point. 11/25/2019 on evaluation today patient appears to be doing well with regard to her lower extremity ulcer. She has been tolerating the dressing changes without complication. Fortunately there is no signs of active infection at this time. No fevers, chills, nausea,  vomiting, or diarrhea. 12/02/2019 on evaluation today patient appears to be doing okay with regard to her ulcer on the leg. Fortunately there is no signs of active infection at this time. No fever chills noted she has been tolerating the dressing changes without complication. With that being said overall I am pleased with the fact that there is no signs of infection but at the same time I really feel like she needs more to progress moving week to week and right now were really not seeing that. We have put in for approval for Apligraf which I think could help to move things along. With that being said we have not heard anything regarding approval at this point. 12/10/2019; patient comes in on my day although have not seen her previously. She has what was a traumatic wound on the left anterior mid tibia. Initially sutured however the area never really healed according to the patient. She has been using Santyl now using Prisma. There is exposed tendon. We have made application for Apligraf apparently this is going through [employer] still has not been approved. She arrives in clinic with erythema around the wound and 4 small circular papules superior to the wound. These are nontender and she is not complaining of any more pain. The exact cause of this is not really clear. The patient has idiopathic peripheral neuropathy which for which she has been extensively evaluated in the past she is not a diabetic she does not have any rheumatologic problems she is aware of. She cannot wear compression because it aggravates her neuropathy 4/15; traumatic wound on the left anterior mid tibia. She arrives today with extensive erythema spreading from the wound medially towards her heel. This is tender swollen and warm compatible with cellulitis. We are using silver collagen to the wound 4/20; 5-day follow-up. Traumatic wound on the left anterior mid tibia. This is in follow-up for the cellulitis she had on her  visit 5 days ago. I gave her empiric cephalexin I did not culture this. She arrives today with the erythema that we marked much improved. She has not been systemically unwell Upon evaluation today patient's wound unfortunately is not doing very well this in fact is somewhat deeper than even last week4/28/2021 which is unfortunate. I am very concerned about the fact that to be honest that the patient does not seem to be doing as good as we would have expected in the past 2 weeks since I last saw her. Fortunately there is no evidence of active infection systemically which is good news. No fevers, chills, nausea, vomiting, or diarrhea. She has been on Keflex. Objective Constitutional Well-nourished and well-hydrated in no acute distress. Vitals Time Taken: 7:59 AM, Height: 66 in, Weight: 156 lbs, BMI: 25.2, Temperature: 97.5 F, Pulse: 77 bpm, Respiratory Rate: 18 breaths/min, Blood Pressure: 118/76 mmHg. Respiratory normal breathing without difficulty. Psychiatric this patient is able to make decisions and demonstrates good insight into disease process. Alert and Oriented x 3. pleasant and cooperative. General Notes: Upon inspection patient's wound bed actually showed signs of good granulation at this time. Fortunately there is no evidence of active infection which is great  news. No fevers, chills, nausea, vomiting, or diarrhea. Overall I still feel like the patient may have some cellulitis going on here. She has been approved for the Apligraf but at this point I think that this is a moot point as we have to get this infection under control before we can even consider utilization of that. I think also the patient needs some cleaning up with regard to the wound bed I think Santyl would be appropriate in this regard she still has some leftover from previous. Integumentary (Hair, Skin) Wound #2 status is Open. Original cause of wound was Trauma. The wound is located on the Left,Anterior Lower Leg.  The wound measures 5.4cm length x 3.9cm width x 0.4cm depth; 16.54cm^2 area and 6.616cm^3 volume. There is muscle, tendon, and Fat Layer (Subcutaneous Tissue) Exposed exposed. There is no tunneling or undermining noted. There is a medium amount of serous drainage noted. The wound margin is epibole. There is small (1-33%) pink granulation within the wound bed. There is a large (67-100%) amount of necrotic tissue within the wound bed including Adherent Slough. 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 Dressing Change Frequency: Wound #2 Left,Anterior Lower Leg: Change dressing every day. Wound Cleansing: Wound #2 Left,Anterior Lower Leg: May shower and wash wound with soap and water. - scrub gently with gauze Primary Wound Dressing: Wound #2 Left,Anterior Lower Leg: Santyl Ointment Secondary Dressing: Wound #2 Left,Anterior Lower Leg: Kerlix/Rolled Gauze T (non adherent pad) elfa Edema Control: Avoid standing for long periods of time Elevate legs to the level of the heart or above for 30 minutes daily and/or when sitting, a frequency of: - throughout the day Exercise regularly Laboratory ordered were: Anerobic culture - left anterior lower leg 1. I would recommend currently that we go ahead and switch to [facility] as the treatment of choice for the patient over the next week she is in agreement with the plan. 2. I am also can recommend that we go ahead and continue to monitor for any signs of worsening infection in general in the meantime if she has any issues she  will contact the office and let me know. 3. I would also recommend that we plan to apply the Apligraf as soon as we can get anything done from the standpoint of getting the infection under control so this will be safe to apply. We will see patient back for reevaluation in 1 week here in the clinic. If anything worsens or changes patient will contact our office for additional recommendations. Electronic Signature(s) Signed: 12/30/2019 9:08:24 AM By: Lenda Kelp PA-C Entered By: Lenda Kelp on 12/30/2019 65:78:46 -------------------------------------------------------------------------------- SuperBill Details Patient Name: Date of Service: [name] 12/30/2019 Medical Record Number: [id number] Patient Account Number: [id number] Date of Birth/Sex: Treating RN: 09/16/1960 (59 y.o. Wynelle Link Primary Care Provider: Docia Chuck, Dibas Other Clinician: Referring Provider: Treating Provider/Extender: Richardo Priest, Dibas Weeks in Treatment: 13 Diagnosis Coding ICD-10 Codes Code Description I87.2 Venous insufficiency (chronic) (peripheral) L97.822 Non-pressure chronic ulcer of other part of left lower leg with fat layer exposed L03.116 Cellulitis of left lower limb L97.828 Non-pressure chronic ulcer of other part of left lower leg with  other specified severity 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 I73.00 Raynaud's syndrome without gangrene 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, unspecified Facility Procedures CPT4 Code: [id number] Description: [phone] - DEBRIDE W/O ANES NON SELECT Modifier: Quantity: 1 Physician Procedures : CPT4 Code Description Modifier [id number] - WC PHYS LEVEL 4 - EST PT ICD-10 Diagnosis Description I87.2 Venous insufficiency (chronic) (peripheral) L97.822 Non-pressure chronic ulcer of other part of left lower leg with fat layer exposed L03.116  Cellulitis of left lower limb L97.828 Non-pressure chronic ulcer of other part of  left lower leg with other specified severity Quantity: 1 Electronic Signature(s) Signed: 12/30/2019 5:55:37 PM By: Zandra Abts RN, BSN Signed: 01/01/2020 9:02:44 AM By: Lenda Kelp PA-C Previous Signature: 12/30/2019 9:09:28 AM Version By: Lenda Kelp PA-C Entered By: Zandra Abts on 12/30/2019 09:26:37

## 2020-01-03 LAB — AEROBIC CULTURE? (SUPERFICIAL SPECIMEN)

## 2020-01-03 LAB — AEROBIC CULTURE W GRAM STAIN (SUPERFICIAL SPECIMEN)

## 2020-01-04 NOTE — Progress Notes (Signed)
[name], [name] ([id number]) Visit Report for 12/30/2019 Arrival Information Details Patient Name: Date of Service: [name], [name] 12/30/2019 8:00 A M Medical Record Number: [id number] Patient Account Number: [id number] Date of Birth/Sex: Treating RN: Nov 25, 1960 (59 y.o. [name] Primary Care [name]: [name], Dibas Other Clinician: Referring [name]: Treating [name]/Extender: Dayna Ramus, Dibas Weeks in Treatment: 13 Visit Information History Since Last Visit All ordered tests and consults were completed: No Patient Arrived: Ambulatory Added or deleted any medications: No Arrival Time: 07:58 Any new allergies or adverse reactions: No Accompanied By: self Had a fall or experienced change in No Transfer Assistance: None activities of daily living that may affect Patient Identification Verified: Yes risk of falls: Secondary Verification Process Completed: Yes Signs or symptoms of abuse/neglect since last visito No Patient Requires Transmission-Based Precautions: No Hospitalized since last visit: No Patient Has Alerts: Yes Implantable device outside of the clinic excluding No cellular tissue based products placed in the center since last visit: Has Dressing in Place as Prescribed: Yes Pain Present Now: Yes Electronic Signature(s) Signed: 01/04/2020 5:02:18 PM By: Carlene Coria RN Entered By: Carlene Coria on 12/30/2019 07:58:56 -------------------------------------------------------------------------------- Lower Extremity Assessment Details Patient Name: Date of Service: [name] 12/30/2019 8:00 A M Medical Record Number: [id number] Patient Account Number: [id number] Date of Birth/Sex: Treating RN: 01/01/1961 (59 y.o. [name] Primary Care [name]: [name], Dibas Other Clinician: Referring [name]: Treating [name]/Extender: Dayna Ramus, Dibas Weeks in Treatment: 13 Edema Assessment Assessed: [Left: No] [Right:  No] Edema: [Left: Ye] [Right: s] Calf Left: Right: Point of Measurement: 29 cm From Medial Instep 36 cm cm Ankle Left: Right: Point of Measurement: 8 cm From Medial Instep 22 cm cm Electronic Signature(s) Signed: 01/04/2020 5:02:18 PM By: Carlene Coria RN Entered By: Carlene Coria on 12/30/2019 08:04:10 -------------------------------------------------------------------------------- Multi-Disciplinary Care Plan Details Patient Name: Date of Service: Cheyenne Mccallum B. 12/30/2019 8:00 A M Medical Record Number: [id number] Patient Account Number: [id number] Date of Birth/Sex: Treating RN: 04-Aug-1961 (59 y.o. [name] Primary Care Lamyra Malcolm: [name], Dibas Other Clinician: Referring Kyera Felan: Treating [name]/Extender: Dayna Ramus, Dibas Weeks in Treatment: 13 Active Inactive Venous Leg Ulcer Nursing Diagnoses: Knowledge deficit related to disease process and management Potential for venous Insuffiency (use before diagnosis confirmed) Goals: Patient will maintain optimal edema control Date Initiated: 09/30/2019 Target Resolution Date: 01/29/2020 Goal Status: Active Patient/caregiver will verbalize understanding of disease process and disease management Date Initiated: 09/30/2019 Date Inactivated: 11/25/2019 Target Resolution Date: 11/25/2019 Goal Status: Met Interventions: Assess peripheral edema status every visit. Compression as ordered Provide education on venous insufficiency Treatment Activities: Therapeutic compression applied : 09/30/2019 Notes: Wound/Skin Impairment Nursing Diagnoses: Impaired tissue integrity Knowledge deficit related to ulceration/compromised skin integrity Goals: Patient/caregiver will verbalize understanding of skin care regimen Date Initiated: 09/30/2019 Target Resolution Date: 01/29/2020 Goal Status: Active Ulcer/skin breakdown will have a volume reduction of 30% by week 4 Date Initiated: 09/30/2019 Date Inactivated:  10/28/2019 Target Resolution Date: 10/28/2019 Goal Status: Unmet Unmet Reason: other comorbidities Interventions: Assess patient/caregiver ability to obtain necessary supplies Assess patient/caregiver ability to perform ulcer/skin care regimen upon admission and as needed Assess ulceration(s) every visit Provide education on ulcer and skin care Treatment Activities: Skin care regimen initiated : 09/30/2019 Topical wound management initiated : 09/30/2019 Notes: Electronic Signature(s) Signed: 12/30/2019 5:55:37 PM By: Levan Hurst RN, BSN Entered By: Levan Hurst on 12/30/2019 07:53:22 -------------------------------------------------------------------------------- Pain Assessment Details Patient Name: Date of Service: Cheyenne Mccallum B. 12/30/2019 8:00 A M Medical Record  Number: [id number] Patient Account Number: [id number] Date of Birth/Sex: Treating RN: September 18, 1960 (59 y.o. [name] Primary Care [name]: [name], Dibas Other Clinician: Referring [name]: Treating [name]/Extender: Dayna Ramus, Dibas Weeks in Treatment: 13 Active Problems Location of Pain Severity and Description of Pain Patient Has Paino Yes Site Locations Duration of the Pain. Constant / Intermittento Constant Rate the pain. Current Pain Level: 5 Worst Pain Level: 8 Least Pain Level: 2 Tolerable Pain Level: 5 Character of Pain Describe the Pain: Burning Pain Management and Medication Current Pain Management: Medication: Yes Cold Application: No Rest: Yes Massage: No Activity: No T.E.N.S.: No Heat Application: No Leg drop or elevation: No Is the Current Pain Management Adequate: Inadequate How does your wound impact your activities of daily livingo Sleep: Yes Bathing: No Appetite: Yes Relationship With Others: No Bladder Continence: No Emotions: No Bowel Continence: No Work: No Toileting: No Drive: No Dressing: No Hobbies: No Electronic Signature(s) Signed:  01/04/2020 5:02:18 PM By: Carlene Coria RN Entered By: Carlene Coria on 12/30/2019 08:00:27 -------------------------------------------------------------------------------- Patient/Caregiver Education Details Patient Name: Date of Service: Cordon, Madiline B. 4/28/2021andnbsp8:00 A M Medical Record Number: [id number] Patient Account Number: [id number] Date of Birth/Gender: Treating RN: 1960/12/04 (59 y.o. [name] Primary Care Physician: [name], Dibas Other Clinician: Referring Physician: Treating Physician/Extender: Dayna Ramus, Dibas Weeks in Treatment: 13 Education Assessment Education Provided To: Patient Education Topics Provided Wound/Skin Impairment: Methods: Explain/Verbal Responses: State content correctly Electronic Signature(s) Signed: 12/30/2019 5:55:37 PM By: Levan Hurst RN, BSN Entered By: Levan Hurst on 12/30/2019 07:53:33 -------------------------------------------------------------------------------- Wound Assessment Details Patient Name: Date of Service: [name] 12/30/2019 8:00 A M Medical Record Number: [id number] Patient Account Number: [id number] Date of Birth/Sex: Treating RN: [birth year] (59 y.o. [name] Primary Care Cadey Bazile: [name], Dibas Other Clinician: Referring Evaristo Tsuda: Treating Shatia Sindoni/Extender: Dayna Ramus, Dibas Weeks in Treatment: 13 Wound Status Wound Number: 2 Primary Etiology: Venous Leg Ulcer Wound Location: Left, Anterior Lower Leg Wound Status: Open Wounding Event: Trauma Comorbid History: Lymphedema, Raynauds, Neuropathy Date Acquired: 08/06/2019 Weeks Of Treatment: 13 Clustered Wound: No Wound Measurements Length: (cm) 5.4 Width: (cm) 3.9 Depth: (cm) 0.4 Area: (cm) 16.54 Volume: (cm) 6.616 % Reduction in Area: 9.3% % Reduction in Volume: -81.4% Epithelialization: None Tunneling: No Undermining: No Wound Description Classification: Full Thickness With Exposed  Support Structures Wound Margin: Epibole Exudate Amount: Medium Exudate Type: Serous Exudate Color: amber Foul Odor After Cleansing: No Slough/Fibrino Yes Wound Bed Granulation Amount: Small (1-33%) Exposed Structure Granulation Quality: Pink Fascia Exposed: No Necrotic Amount: Large (67-100%) Fat Layer (Subcutaneous Tissue) Exposed: Yes Necrotic Quality: Adherent Slough Tendon Exposed: Yes Muscle Exposed: Yes Necrosis of Muscle: No Joint Exposed: No Bone Exposed: No Treatment Notes Wound #2 (Left, Anterior Lower Leg) 1. Cleanse With Wound Cleanser 3. Primary Dressing Applied Santyl Notes secondary telfa pad Electronic Signature(s) Signed: 01/04/2020 5:02:18 PM By: Carlene Coria RN Entered By: Carlene Coria on 12/30/2019 08:04:47 -------------------------------------------------------------------------------- Vitals Details Patient Name: Date of Service: Cheyenne Mccallum B. 12/30/2019 8:00 A M Medical Record Number: [id number] Patient Account Number: [id number] Date of Birth/Sex: Treating RN: 1961-07-14 (59 y.o. [name] Primary Care Kamran Coker: [name], Dibas Other Clinician: Referring Ora Bollig: Treating Zarahi Fuerst/Extender: Dayna Ramus, Dibas Weeks in Treatment: 13 Vital Signs Time Taken: 07:59 Temperature (F): 97.5 Height (in): 66 Pulse (bpm): 77 Weight (lbs): 156 Respiratory Rate (breaths/min): 18 Body Mass Index (BMI): 25.2 Blood Pressure (mmHg): 118/76 Reference Range: 80 - 120 mg / dl Electronic Signature(s) Signed: 01/04/2020  5:02:18 PM By: Carlene Coria RN Entered By: Carlene Coria on 12/30/2019 07:59:36

## 2020-01-06 ENCOUNTER — Encounter (HOSPITAL_BASED_OUTPATIENT_CLINIC_OR_DEPARTMENT_OTHER): Payer: Worker's Compensation | Attending: Physician Assistant | Admitting: Physician Assistant

## 2020-01-06 DIAGNOSIS — G609 Hereditary and idiopathic neuropathy, unspecified: Secondary | ICD-10-CM | POA: Diagnosis not present

## 2020-01-06 DIAGNOSIS — I872 Venous insufficiency (chronic) (peripheral): Secondary | ICD-10-CM | POA: Insufficient documentation

## 2020-01-06 DIAGNOSIS — M3501 Sicca syndrome with keratoconjunctivitis: Secondary | ICD-10-CM | POA: Diagnosis not present

## 2020-01-06 DIAGNOSIS — L97828 Non-pressure chronic ulcer of other part of left lower leg with other specified severity: Secondary | ICD-10-CM | POA: Diagnosis not present

## 2020-01-06 DIAGNOSIS — I7300 Raynaud's syndrome without gangrene: Secondary | ICD-10-CM | POA: Diagnosis not present

## 2020-01-06 DIAGNOSIS — L03116 Cellulitis of left lower limb: Secondary | ICD-10-CM | POA: Diagnosis not present

## 2020-01-06 DIAGNOSIS — L97822 Non-pressure chronic ulcer of other part of left lower leg with fat layer exposed: Secondary | ICD-10-CM | POA: Diagnosis present

## 2020-01-06 NOTE — Progress Notes (Addendum)
[name] ([id number]) Visit Report for 01/06/2020 Chief Complaint Document Details Patient Name: Date of Service: [name], [name] 01/06/2020 8:00 A M Medical Record Number: [id number] Patient Account Number: [id number] Date of Birth/Sex: Treating RN: Aug 20, 1961 (59 y.o. [name] Standard Primary Care Provider: Docia Chuck, [place] Other Clinician: Referring Provider: Treating Provider/Extender: Richardo Priest, [place] Weeks in Treatment: 14 Information Obtained from: Patient Chief Complaint Left LE Ulcers Electronic Signature(s) Signed: 01/06/2020 8:39:12 AM By: Lenda Kelp PA-C Entered By: Lenda Kelp on 01/06/2020 08:39:12 -------------------------------------------------------------------------------- Debridement Details Patient Name: Date of Service: Cheyenne Aran B. 01/06/2020 8:00 A M Medical Record Number: [id number] Patient Account Number: [id number] Date of Birth/Sex: Treating RN: [birth year] (59 y.o. [name] Standard Primary Care Provider: Docia Chuck, [place] Other Clinician: Referring Provider: Treating Provider/Extender: Richardo Priest, [place] Weeks in Treatment: 14 Debridement Performed for Assessment: Wound #2 Left,Anterior Lower Leg Performed By: Physician Lenda Kelp, PA Debridement Type: Chemical/Enzymatic/Mechanical Agent Used: Santyl Severity of Tissue Pre Debridement: Fat layer exposed Level of Consciousness (Pre-procedure): Awake and Alert Pre-procedure Verification/Time Out Yes - 08:45 Taken: Bleeding: None Response to Treatment: Procedure was tolerated well Level of Consciousness (Post- Awake and Alert procedure): Post Debridement Measurements of Total Wound Length: (cm) 5.6 Width: (cm) 4 Depth: (cm) 0.3 Volume: (cm) 5.278 Character of Wound/Ulcer Post Debridement: Requires Further Debridement Severity of Tissue Post Debridement: Fat layer exposed Post Procedure Diagnosis Same as Pre-procedure Electronic  Signature(s) Signed: 01/06/2020 5:03:53 PM By: Zenaida Deed RN, BSN Signed: 01/06/2020 6:28:26 PM By: Lenda Kelp PA-C Entered By: Zenaida Deed on 01/06/2020 08:43:56 -------------------------------------------------------------------------------- HPI Details Patient Name: Date of Service: Cheyenne Aran B. 01/06/2020 8:00 A M Medical Record Number: [id number] Patient Account Number: [id number] Date of Birth/Sex: Treating RN: Sep 17, 1960 (59 y.o. [name] Standard Primary Care Provider: Docia Chuck, [place] Other Clinician: Referring Provider: Treating Provider/Extender: Richardo Priest, [place] Weeks in Treatment: 14 History of Present Illness HPI Description: 09/30/2019 upon evaluation today patient presents for initial inspection here in our clinic concerning issues that she has been experiencing with a wound on her left anterior lower leg. She unfortunately had a significant laceration which required 19 sutures and she shows me the pictures both before as well as after sutured in a very good job was done in this regard. Unfortunately the skin did not take and necrosis. Subsequently this wound open and she has been having more significant issues with that since. Fortunately there is no evidence of active infection at this time which is good news. No fever chills noted. She has a history of chronic venous stasis but is no longer able to wear any compression in fact she cannot even wear regular socks due to her neuropathy and the pain that she experiences in her feet if she does. For that reason she tries to elevate her legs as much as she can at nighttime but again she really cannot wear any type of compression. With regard to antibiotic it does not appear that she has been on anything recently. 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 Fortunately there is no evidence of local or systemic infection. No fevers, chills, nausea, vomiting, or diarrhea. 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 10/07/2019 on evaluation today patient presents for follow-up concerning her wound on the left lower extremity. She has been tolerating the dressing changes without complication. With that being said she just got the Santyl 2 days ago. Obviously it has not had enough time to really do what we need to do she would like to obviously give this some time before proceeding with more aggressive debridement or otherwise. 10/21/2019 upon evaluation today patient appears to be doing better with regard to the overall appearance of her wound is not quite as dry I do believe the Santyl has helped her improve to some degree here. Fortunately there is no signs of active infection at this time. I think we may be able to clean off the wound with a little bit of additional debridement today and likely we can transition away from the Santyl to some kind of collagen type dressing. 10/28/2019 upon evaluation today patient actually appears to be doing fairly well with regard to her wound today. She has been tolerating the dressing changes we did switch to collagen and fortunately that seems to be doing well for her at this point. There does not seem to be any signs of active infection I do feel like the surface of the wound is looking better at this time as well. 11/04/2019 upon evaluation today patient appears to be doing well with regard to her wound. She is making some progress here and the wound does appear to be measuring smaller which is good news. Overall this is good to be somewhat slow but nonetheless I am encouraged by the fact that the collagen does seem to have helped her. 11/11/2019 on evaluation today patient's wound is showing some signs of improvement. The good news is  her overall trend does seem to be towards getting better compared to where she was previous. The smaller wounds along the side actually might even be healed but we can watch this 1 more week before I heal this out. Overall I feel like there is improvement here. She is still using the silver collagen. 11/18/2019 upon evaluation today patient appears to be doing about the same with regard to her wound. This is not significantly smaller there still appears to be a lot of inflammation. For that reason I do want to see about actually applying triamcinolone to the actual wound bed to see if this can be of benefit as well. I thought about this for couple weeks but I think it may be a good thing to proceed with at this point. 11/25/2019 on evaluation today patient appears to be doing well with regard to her lower extremity ulcer. She has been tolerating the dressing changes without complication. Fortunately there is no signs of active infection at this time. No fevers, chills, nausea, vomiting, or diarrhea. 12/02/2019 on evaluation today patient appears to be doing okay with regard to her ulcer on the leg. Fortunately there is no signs of active infection at this time. No fever chills noted she has been tolerating the dressing changes without complication. With that being said overall I am pleased with the fact that there is no signs of infection but at the same time I really feel like she needs more to progress moving week to week and right now were really not seeing that. We have put in for approval for Apligraf which I think could help to move things along. With that being said we have not heard anything regarding approval at this  point. 12/10/2019; patient comes in on my day although have not seen her previously. She has what was a traumatic wound on the left anterior mid tibia. Initially sutured however the area never really healed according to the patient. She has been using Santyl now using Prisma. There is  exposed tendon. We have made application for Apligraf apparently this is going through [employer] still has not been approved. She arrives in clinic with erythema around the wound and 4 small circular papules superior to the wound. These are nontender and she is not complaining of any more pain. The exact cause of this is not really clear. The patient has idiopathic peripheral neuropathy which for which she has been extensively evaluated in the past she is not a diabetic she does not have any rheumatologic problems she is aware of. She cannot wear compression because it aggravates her neuropathy 4/15; traumatic wound on the left anterior mid tibia. She arrives today with extensive erythema spreading from the wound medially towards her heel. This is tender swollen and warm compatible with cellulitis. We are using silver collagen to the wound 4/20; 5-day follow-up. Traumatic wound on the left anterior mid tibia. This is in follow-up for the cellulitis she had on her visit 5 days ago. I gave her empiric cephalexin I did not culture this. She arrives today with the erythema that we marked much improved. She has not been systemically unwell Upon evaluation today patient's wound unfortunately is not doing very well this in fact is somewhat deeper than even last week4/28/2021 which is unfortunate. I am very concerned about the fact that to be honest that the patient does not seem to be doing as good as we would have expected in the past 2 weeks since I last saw her. Fortunately there is no evidence of active infection systemically which is good news. No fevers, chills, nausea, vomiting, or diarrhea. She has been on Keflex. 01/06/2020 upon evaluation today patient unfortunately appears to still be doing somewhat poorly overall in regard to her ulcer. There is definite signs of infection. She has been on Keflex in April but unfortunately this just does not seem to be doing as well as we would like. I  do believe that we will get a need to place her on Augmentin in order to try to get this better. She tested positive for Proteus and E. coli on the culture and is allergic to doxycycline. The patient's wound currently is not looking healthy at all to me. Electronic Signature(s) Signed: 01/06/2020 8:49:59 AM By: Lenda KelpStone III, Jarrius Huaracha PA-C Entered By: Lenda KelpStone III, Pride Gonzales on 01/06/2020 08:49:58 -------------------------------------------------------------------------------- Physical Exam Details Patient Name: Date of Service: Lavella LemonsSMITHEY, Marylon B. 01/06/2020 8:00 A M Medical Record Number: [id number] Patient Account Number: [id number] Date of Birth/Sex: Treating RN: March 07, 1961 (59 y.o. [name] StandardF) Boehlein, Linda Primary Care Provider: Docia ChuckKoirala, [place] Other Clinician: Referring Provider: Treating Provider/Extender: Richardo PriestStone III, [place] Koirala, [place] Weeks in Treatment: 14 Constitutional Well-nourished and well-hydrated in no acute distress. Respiratory normal breathing without difficulty. Psychiatric this patient is able to make decisions and demonstrates good insight into disease process. Alert and Oriented x 3. pleasant and cooperative. Notes Upon inspection today patient's wound bed actually appears to be doing poorly. She has erythema surrounding in the leg despite the Keflex that she was on this does not seem to have helped or at least she has something else going on at this point. With that being said I do believe Augmentin would be an excellent choice for her.  It is a good penetrating antibiotic and also has a low side effect profile. She still has lower extremity edema which I think is contributing to this not doing well either. Electronic Signature(s) Signed: 01/06/2020 8:50:54 AM By: Worthy Keeler PA-C Entered By: Worthy Keeler on 01/06/2020 08:50:54 -------------------------------------------------------------------------------- Physician Orders Details Patient Name: Date of Service: [name]  01/06/2020 8:00 A M Medical Record Number: [id number] Patient Account Number: [id number] Date of Birth/Sex: Treating RN: 1960-11-13 (59 y.o. 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 Dressing Change Frequency Wound #2 Left,Anterior Lower Leg Change dressing every day. Wound Cleansing Wound #2 Left,Anterior Lower Leg May shower and wash wound with soap and water. - scrub gently with gauze Primary Wound Dressing Wound #2 Left,Anterior Lower Leg Santyl Ointment Secondary Dressing Wound #2 Left,Anterior Lower Leg Kerlix/Rolled Gauze Telfa (non adherent pad) Edema Control Avoid standing for long periods of time Elevate legs to the level of the heart or above for 30 minutes daily and/or when sitting, a frequency of: - throughout the day Exercise regularly Additional Orders / Instructions Other: - take antibiotics as prescribed Patient Medications llergies: doxycycline, Lyrica, Vivelle, Aleve, Motrin, adhesive bandage A Notifications Medication Indication Start End 01/06/2020 Augmentin DOSE 1 - oral 875 mg-125 mg tablet - 1 tablet oral taken 2 times per day for 14 days 01/06/2020 Santyl DOSE topical 250 unit/gram ointment - ointment topical Apply nickel thick daily to the wound bed and then cover with  a dressing as directed in clinic Electronic Signature(s) Signed: 01/06/2020 8:53:20 AM By: Worthy Keeler PA-C Previous Signature: 01/06/2020 8:51:50 AM Version By: Worthy Keeler PA-C Entered By: Worthy Keeler on 01/06/2020 08:53:20 -------------------------------------------------------------------------------- Problem List Details Patient Name: Date of Service: [name] 01/06/2020 8:00 A M Medical Record Number: [id number] Patient Account Number: [id number] Date of Birth/Sex: Treating RN: Jan 06, 1961 (59 y.o. 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 01/06/2020 8:00 A M Medical Record Number: [id number] Patient Account Number: [id number] Date of Birth/Sex: Treating RN: 1960/11/23 (59 y.o. [name] Primary Care Provider: [name], [place] Other Clinician: Referring Provider: Treating Provider/Extender: Dayna Ramus, [place] Weeks in Treatment:  14 Subjective Chief Complaint Information obtained from Patient Left LE Ulcers History of Present Illness (HPI) 09/30/2019 upon evaluation today patient presents for initial inspection here in our clinic concerning issues that she has been experiencing with a wound on her left anterior lower leg. She unfortunately had a significant laceration which required 19 sutures and she shows me the pictures both before as well as after sutured in a very good job was done in this regard. Unfortunately the skin did not take and necrosis. Subsequently this wound open and she has been having more significant issues with that since. Fortunately there is no evidence of active infection at this time which is good news. No fever chills noted. She has a history of chronic venous stasis but is no longer able to wear any compression in fact she cannot even wear regular socks due to her neuropathy and the pain that she experiences in her feet if she does. For that reason she tries to elevate her legs as much as she can at nighttime but again she really cannot wear any type of compression. With regard to antibiotic it does not appear that she has been on anything recently. 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 Fortunately there is no evidence of local or systemic infection. No fevers, chills, nausea, vomiting, or diarrhea. 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 10/07/2019 on evaluation today patient presents for follow-up concerning her wound on the left lower extremity. She has been tolerating the dressing changes without complication. With that being said she just got the Santyl 2 days ago. Obviously it has not had enough time to really  do what we need to do she would like to obviously give this some time before proceeding with more aggressive debridement or otherwise. 10/21/2019 upon evaluation today patient appears to be doing better with regard to the overall appearance of her wound is not quite as dry I do believe the Santyl has helped her improve to some degree here. Fortunately there is no signs of active infection at this time. I think we may be able to clean off the wound with a little bit of additional debridement today and likely we can transition away from the Santyl to some kind of collagen type dressing. 10/28/2019 upon evaluation today patient actually appears to be doing fairly well with regard to her wound today. She has been tolerating the dressing changes we did switch to collagen and fortunately that seems to be doing well for her at this point. There does not seem to be any signs of active infection I do feel like the surface of the wound is looking better at this time as well. 11/04/2019 upon evaluation today patient appears to be doing well with regard to her wound. She is making some progress here and the wound does appear to be measuring smaller which is good news. Overall this is good to be somewhat slow but nonetheless I am encouraged by the fact that the collagen does seem to have helped her. 11/11/2019 on evaluation today patient's wound is showing some signs of improvement. The good news is her overall trend does seem to be towards getting better compared to where she  was previous. The smaller wounds along the side actually might even be healed but we can watch this 1 more week before I heal this out. Overall I feel like there is improvement here. She is still using the silver collagen. 11/18/2019 upon evaluation today patient appears to be doing about the same with regard to her wound. This is not significantly smaller there still appears to be a lot of inflammation. For that reason I do want to see about  actually applying triamcinolone to the actual wound bed to see if this can be of benefit as well. I thought about this for couple weeks but I think it may be a good thing to proceed with at this point. 11/25/2019 on evaluation today patient appears to be doing well with regard to her lower extremity ulcer. She has been tolerating the dressing changes without complication. Fortunately there is no signs of active infection at this time. No fevers, chills, nausea, vomiting, or diarrhea. 12/02/2019 on evaluation today patient appears to be doing okay with regard to her ulcer on the leg. Fortunately there is no signs of active infection at this time. No fever chills noted she has been tolerating the dressing changes without complication. With that being said overall I am pleased with the fact that there is no signs of infection but at the same time I really feel like she needs more to progress moving week to week and right now were really not seeing that. We have put in for approval for Apligraf which I think could help to move things along. With that being said we have not heard anything regarding approval at this point. 12/10/2019; patient comes in on my day although have not seen her previously. She has what was a traumatic wound on the left anterior mid tibia. Initially sutured however the area never really healed according to the patient. She has been using Santyl now using Prisma. There is exposed tendon. We have made application for Apligraf apparently this is going through [employer] still has not been approved. She arrives in clinic with erythema around the wound and 4 small circular papules superior to the wound. These are nontender and she is not complaining of any more pain. The exact cause of this is not really clear. The patient has idiopathic peripheral neuropathy which for which she has been extensively evaluated in the past she is not a diabetic she does not have any rheumatologic  problems she is aware of. She cannot wear compression because it aggravates her neuropathy 4/15; traumatic wound on the left anterior mid tibia. She arrives today with extensive erythema spreading from the wound medially towards her heel. This is tender swollen and warm compatible with cellulitis. We are using silver collagen to the wound 4/20; 5-day follow-up. Traumatic wound on the left anterior mid tibia. This is in follow-up for the cellulitis she had on her visit 5 days ago. I gave her empiric cephalexin I did not culture this. She arrives today with the erythema that we marked much improved. She has not been systemically unwell Upon evaluation today patient's wound unfortunately is not doing very well this in fact is somewhat deeper than even last week4/28/2021 which is unfortunate. I am very concerned about the fact that to be honest that the patient does not seem to be doing as good as we would have expected in the past 2 weeks since I last saw her. Fortunately there is no evidence of active infection systemically which is good news.  No fevers, chills, nausea, vomiting, or diarrhea. She has been on Keflex. 01/06/2020 upon evaluation today patient unfortunately appears to still be doing somewhat poorly overall in regard to her ulcer. There is definite signs of infection. She has been on Keflex in April but unfortunately this just does not seem to be doing as well as we would like. I do believe that we will get a need to place her on Augmentin in order to try to get this better. She tested positive for Proteus and E. coli on the culture and is allergic to doxycycline. The patient's wound currently is not looking healthy at all to me. Objective Constitutional Well-nourished and well-hydrated in no acute distress. Vitals Time Taken: 8:06 AM, Height: 66 in, Source: Stated, Weight: 156 lbs, Source: Stated, BMI: 25.2, Temperature: 97.8 F, Pulse: 79 bpm, Respiratory Rate: 18 breaths/min, Blood  Pressure: 115/79 mmHg. Respiratory normal breathing without difficulty. Psychiatric this patient is able to make decisions and demonstrates good insight into disease process. Alert and Oriented x 3. pleasant and cooperative. General Notes: Upon inspection today patient's wound bed actually appears to be doing poorly. She has erythema surrounding in the leg despite the Keflex that she was on this does not seem to have helped or at least she has something else going on at this point. With that being said I do believe Augmentin would be an excellent choice for her. It is a good penetrating antibiotic and also has a low side effect profile. She still has lower extremity edema which I think is contributing to this not doing well either. Integumentary (Hair, Skin) Wound #2 status is Open. Original cause of wound was Trauma. The wound is located on the Left,Anterior Lower Leg. The wound measures 5.6cm length x 4cm width x 0.3cm depth; 17.593cm^2 area and 5.278cm^3 volume. There is muscle, tendon, and Fat Layer (Subcutaneous Tissue) Exposed exposed. There is no tunneling or undermining noted. There is a medium amount of purulent drainage noted. The wound margin is epibole. There is small (1-33%) pink granulation within the wound bed. There is a large (67-100%) amount of necrotic tissue within the wound bed including Adherent Slough. 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 There was a Chemical/Enzymatic/Mechanical debridement performed by Lenda Kelp, PA.Marland Kitchen Agent used was [employer]. A time out was conducted at 08:45, prior to the start of the procedure. There was no bleeding. The procedure was tolerated well. Post Debridement Measurements: 5.6cm length x 4cm width x 0.3cm depth; 5.278cm^3 volume. Character of Wound/Ulcer Post Debridement requires further debridement. Severity of Tissue Post Debridement is: Fat layer exposed. Post procedure Diagnosis Wound #2: Same as Pre-Procedure Plan Follow-up Appointments: Return Appointment in 1 week. Dressing Change Frequency: Wound #2 Left,Anterior Lower Leg: Change dressing every day. Wound Cleansing: Wound #2 Left,Anterior Lower Leg: May shower and wash wound with soap and water. - scrub gently with gauze Primary Wound Dressing: Wound #2 Left,Anterior Lower Leg: Santyl Ointment Secondary Dressing: Wound #2 Left,Anterior Lower Leg: Kerlix/Rolled Gauze T (non adherent pad) elfa Edema Control: Avoid standing for long periods  of time Elevate legs to the level of the heart or above for 30 minutes daily and/or when sitting, a frequency of: - throughout the day Exercise regularly Additional Orders / Instructions: Other: - take antibiotics as prescribed The following medication(s) was prescribed: Augmentin oral 875 mg-125 mg tablet 1 1 tablet oral taken 2 times per day for 14 days starting 01/06/2020 Santyl topical 250 unit/gram ointment ointment topical Apply nickel thick daily to the wound bed and then cover with a dressing as directed in clinic starting 01/06/2020 1. I would recommend currently that we go ahead and initiate Augmentin as the treatment of choice at this point for the patient. I do believe that this is a good antibiotic choice based on the bacterial profile from her culture. 2. I am also can recommend at this point that we continue with the Santyl I think that is also something that is necessary  at this point. We will try to get the wound bed cleaned and again this is a good way to do that. I believe we have seen some benefit from this up to this point and I would recommend continuing as such. 3. I do think the patient needs to continue to elevate her legs much as possible. After we can get this infection under control I am also going to try a Curlex and Coban wrap as a light wrap to try to help with some of the edema. The patient is in agreement with that plan release given that a try. We will see patient back for reevaluation in 1 week here in the clinic. If anything worsens or changes patient will contact our office for additional recommendations. Electronic Signature(s) Signed: 01/06/2020 8:53:28 AM By: Lenda Kelp PA-C Entered By: Lenda Kelp on 01/06/2020 08:53:28 -------------------------------------------------------------------------------- SuperBill Details Patient Name: Date of Service: [name] 01/06/2020 Medical Record Number: [id number] Patient Account Number: [id number] Date of Birth/Sex: Treating RN: 12/01/1960 (59 y.o. [name] Standard Primary Care Provider: Docia Chuck, [place] Other Clinician: Referring Provider: Treating Provider/Extender: Richardo Priest, [place] Weeks in Treatment: 14 Diagnosis Coding ICD-10 Codes Code Description I87.2 Venous insufficiency (chronic) (peripheral) L97.822 Non-pressure chronic ulcer of other part of left lower leg with fat layer exposed L03.116 Cellulitis of left lower limb L97.828 Non-pressure chronic ulcer of other part of left lower leg with other specified severity 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 I73.00 Raynaud's syndrome without gangrene 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, unspecified Facility Procedures CPT4 Code: 78295621 Description: [id number] - DEBRIDE W/O ANES NON SELECT Modifier: Quantity: 1 Physician Procedures : CPT4 Code Description Modifier [id number] - WC PHYS LEVEL  4 - EST PT ICD-10 Diagnosis Description I87.2 Venous insufficiency (chronic) (peripheral) L97.822 Non-pressure chronic ulcer of other part of left lower leg with fat layer exposed L03.116  Cellulitis of left lower limb L97.828 Non-pressure chronic ulcer of other part of left lower leg with other specified severity Quantity: 1 Electronic Signature(s) Signed: 01/06/2020 8:53:50 AM By: Lenda Kelp PA-C Entered By: Lenda Kelp on 01/06/2020 08:53:50

## 2020-01-06 NOTE — Progress Notes (Signed)
[name], [name] ([id number]) Visit Report for 01/06/2020 Arrival Information Details Patient Name: Date of Service: [name], [name] 01/06/2020 8:00 A M Medical Record Number: [id number] Patient Account Number: [id number] Date of Birth/Sex: Treating RN: [birth year] (59 y.o. [name] Primary Care Joselyn Edling: [name], [place] Other Clinician: Referring Taylore Hinde: Treating Dakayla Disanti/Extender: Dayna Ramus, [place] Weeks in Treatment: 14 Visit Information History Since Last Visit Added or deleted any medications: No Patient Arrived: Ambulatory Any new allergies or adverse reactions: No Arrival Time: 08:02 Had a fall or experienced change in No Accompanied By: self activities of daily living that may affect Transfer Assistance: None risk of falls: Patient Identification Verified: Yes Signs or symptoms of abuse/neglect since last visito No Secondary Verification Process Completed: Yes Hospitalized since last visit: No Patient Requires Transmission-Based Precautions: No Implantable device outside of the clinic excluding No Patient Has Alerts: Yes cellular tissue based products placed in the center since last visit: Has Dressing in Place as Prescribed: Yes Pain Present Now: Yes Electronic Signature(s) Signed: 01/06/2020 5:03:53 PM By: Baruch Gouty RN, BSN Entered By: Baruch Gouty on 01/06/2020 08:06:16 -------------------------------------------------------------------------------- Encounter Discharge Information Details Patient Name: Date of Service: Cheyenne Mccallum B. 01/06/2020 8:00 A M Medical Record Number: [id number] Patient Account Number: [id number] Date of Birth/Sex: Treating RN: 12-Mar-1961 (59 y.o. [name] Primary Care Lindsie Simar: [name], [place] Other Clinician: Referring Keelyn Fjelstad: Treating Kamica Florance/Extender: Dayna Ramus, [place] Weeks in Treatment: 14 Encounter Discharge Information Items Post Procedure Vitals Discharge Condition:  Stable Temperature (F): 97.8 Ambulatory Status: Ambulatory Pulse (bpm): 79 Discharge Destination: Home Respiratory Rate (breaths/min): 18 Transportation: Private Auto Blood Pressure (mmHg): 115/79 Accompanied By: self Schedule Follow-up Appointment: Yes Clinical Summary of Care: Patient Declined Electronic Signature(s) Signed: 01/06/2020 4:37:47 PM By: Carlene Coria RN Entered By: Carlene Coria on 01/06/2020 08:58:37 -------------------------------------------------------------------------------- Lower Extremity Assessment Details Patient Name: Date of Service: [name], [name] 01/06/2020 8:00 A M Medical Record Number: [id number] Patient Account Number: [id number] Date of Birth/Sex: Treating RN: February 28, 1961 (59 y.o. [name] Primary Care Graciela Plato: [place], [place] Other Clinician: Referring Lucella Pommier: Treating Dominique Calvey/Extender: Dayna Ramus, [place] Weeks in Treatment: 14 Edema Assessment Assessed: [Left: No] [Right: No] Edema: [Left: Ye] [Right: s] Calf Left: Right: Point of Measurement: 29 cm From Medial Instep 38.5 cm cm Ankle Left: Right: Point of Measurement: 8 cm From Medial Instep 23.8 cm cm Vascular Assessment Pulses: Dorsalis Pedis Palpable: [Left:Yes] Electronic Signature(s) Signed: 01/06/2020 5:03:53 PM By: Baruch Gouty RN, BSN Entered By: Baruch Gouty on 01/06/2020 08:11:59 -------------------------------------------------------------------------------- Multi-Disciplinary Care Plan Details Patient Name: Date of Service: Cheyenne Mccallum B. 01/06/2020 8:00 A M Medical Record Number: [id number] Patient Account Number: [id number] Date of Birth/Sex: Treating RN: 02-27-1961 (59 y.o. [name] Primary Care Jaquilla Woodroof: [name], [place] Other Clinician: Referring Drako Maese: Treating Camry Robello/Extender: Dayna Ramus, [place] Weeks in Treatment: 14 Active Inactive Venous Leg Ulcer Nursing Diagnoses: Knowledge deficit related to  disease process and management Potential for venous Insuffiency (use before diagnosis confirmed) Goals: Patient will maintain optimal edema control Date Initiated: 09/30/2019 Target Resolution Date: 01/29/2020 Goal Status: Active Patient/caregiver will verbalize understanding of disease process and disease management Date Initiated: 09/30/2019 Date Inactivated: 11/25/2019 Target Resolution Date: 11/25/2019 Goal Status: Met Interventions: Assess peripheral edema status every visit. Compression as ordered Provide education on venous insufficiency Treatment Activities: Therapeutic compression applied : 09/30/2019 Notes: Wound/Skin Impairment Nursing Diagnoses: Impaired tissue integrity Knowledge deficit related to ulceration/compromised skin integrity Goals: Patient/caregiver will verbalize understanding of skin care regimen Date Initiated:  09/30/2019 Target Resolution Date: 01/29/2020 Goal Status: Active Ulcer/skin breakdown will have a volume reduction of 30% by week 4 Date Initiated: 09/30/2019 Date Inactivated: 10/28/2019 Target Resolution Date: 10/28/2019 Goal Status: Unmet Unmet Reason: other comorbidities Interventions: Assess patient/caregiver ability to obtain necessary supplies Assess patient/caregiver ability to perform ulcer/skin care regimen upon admission and as needed Assess ulceration(s) every visit Provide education on ulcer and skin care Treatment Activities: Skin care regimen initiated : 09/30/2019 Topical wound management initiated : 09/30/2019 Notes: Electronic Signature(s) Signed: 01/06/2020 5:03:53 PM By: Baruch Gouty RN, BSN Entered By: Baruch Gouty on 01/06/2020 08:42:03 -------------------------------------------------------------------------------- Pain Assessment Details Patient Name: Date of Service: [name] 01/06/2020 8:00 A M Medical Record Number: [id number] Patient Account Number: [id number] Date of Birth/Sex: Treating RN: [birth year]  (59 y.o. [name] Primary Care Maigan Bittinger: [name], [place] Other Clinician: Referring Jasaiah Karwowski: Treating Coye Dawood/Extender: Dayna Ramus, [place] Weeks in Treatment: 14 Active Problems Location of Pain Severity and Description of Pain Patient Has Paino Yes Site Locations Pain Location: Pain Location: Pain in Ulcers With Dressing Change: Yes Duration of the Pain. Constant / Intermittento Intermittent Rate the pain. Current Pain Level: 0 Worst Pain Level: 6 Least Pain Level: 0 Character of Pain Describe the Pain: Sharp, Stabbing, Tender Pain Management and Medication Current Pain Management: Medication: Yes Is the Current Pain Management Adequate: Adequate How does your wound impact your activities of daily livingo Sleep: Yes Bathing: No Appetite: No Relationship With Others: No Bladder Continence: No Emotions: No Bowel Continence: No Work: No Toileting: No Drive: No Dressing: No Hobbies: No Electronic Signature(s) Signed: 01/06/2020 5:03:53 PM By: Baruch Gouty RN, BSN Entered By: Baruch Gouty on 01/06/2020 08:08:07 -------------------------------------------------------------------------------- Patient/Caregiver Education Details Patient Name: Date of Service: [name], Cheyenne B. 5/5/2021andnbsp8:00 A M Medical Record Number: [id number] Patient Account Number: [id number] Date of Birth/Gender: Treating RN: 08-08-1961 (59 y.o. [name] Primary Care Physician: [name], [place] Other Clinician: Referring Physician: Treating Physician/Extender: Dayna Ramus, [place] Weeks in Treatment: 14 Education Assessment Education Provided To: Patient Education Topics Provided Venous: Methods: Explain/Verbal Responses: Reinforcements needed, State content correctly Wound/Skin Impairment: Methods: Explain/Verbal Responses: Reinforcements needed, State content correctly Electronic Signature(s) Signed: 01/06/2020 5:03:53 PM By: Baruch Gouty RN, BSN Entered By: Baruch Gouty on 01/06/2020 08:42:26 -------------------------------------------------------------------------------- Wound Assessment Details Patient Name: Date of Service: [name] 01/06/2020 8:00 A M Medical Record Number: [id number] Patient Account Number: [id number] Date of Birth/Sex: Treating RN: [birth year] (59 y.o. [name] Primary Care Katalyna Socarras: [name], [place] Other Clinician: Referring Isla Sabree: Treating Mishka Stegemann/Extender: Dayna Ramus, [place] Weeks in Treatment: 14 Wound Status Wound Number: 2 Primary Etiology: Venous Leg Ulcer Wound Location: Left, Anterior Lower Leg Wound Status: Open Wounding Event: Trauma Comorbid History: Lymphedema, Raynauds, Neuropathy Date Acquired: 08/06/2019 Weeks Of Treatment: 14 Clustered Wound: No Wound Measurements Length: (cm) 5.6 Width: (cm) 4 Depth: (cm) 0.3 Area: (cm) 17.593 Volume: (cm) 5.278 % Reduction in Area: 3.5% % Reduction in Volume: -44.7% Epithelialization: None Tunneling: No Undermining: No Wound Description Classification: Full Thickness With Exposed Support Structures Wound Margin: Epibole Exudate Amount: Medium Exudate Type: Purulent Exudate Color: yellow, brown, green Foul Odor After Cleansing: No Slough/Fibrino Yes Wound Bed Granulation Amount: Small (1-33%) Exposed Structure Granulation Quality: Pink Fascia Exposed: No Necrotic Amount: Large (67-100%) Fat Layer (Subcutaneous Tissue) Exposed: Yes Necrotic Quality: Adherent Slough Tendon Exposed: Yes Muscle Exposed: Yes Necrosis of Muscle: No Joint Exposed: No Bone Exposed: No Treatment Notes Wound #2 (Left, Anterior Lower Leg) 1. Cleanse With  Wound Cleanser 3. Primary Dressing Applied Santyl Notes secondary telfa pad Electronic Signature(s) Signed: 01/06/2020 5:03:53 PM By: Baruch Gouty RN, BSN Entered By: Baruch Gouty on 01/06/2020  08:12:44 -------------------------------------------------------------------------------- [place] Details Patient Name: Date of Service: Cheyenne Mccallum B. 01/06/2020 8:00 A M Medical Record Number: [id number] Patient Account Number: [id number] Date of Birth/Sex: Treating RN: 1961-02-02 (59 y.o. [name] Primary Care Raliyah Montella: [name], [place] Other Clinician: Referring Reannah Totten: Treating Jilliam Bellmore/Extender: Dayna Ramus, [place] Weeks in Treatment: 14 Vital Signs Time Taken: 08:06 Temperature (F): 97.8 Height (in): 66 Pulse (bpm): 79 Source: Stated Respiratory Rate (breaths/min): 18 Weight (lbs): 156 Blood Pressure (mmHg): 115/79 Source: Stated Reference Range: 80 - 120 mg / dl Body Mass Index (BMI): 25.2 Electronic Signature(s) Signed: 01/06/2020 5:03:53 PM By: Baruch Gouty RN, BSN Entered By: Baruch Gouty on 01/06/2020 08:06:48

## 2020-01-13 ENCOUNTER — Other Ambulatory Visit: Payer: Self-pay

## 2020-01-13 ENCOUNTER — Encounter (HOSPITAL_BASED_OUTPATIENT_CLINIC_OR_DEPARTMENT_OTHER): Payer: Worker's Compensation | Admitting: Physician Assistant

## 2020-01-13 DIAGNOSIS — L97822 Non-pressure chronic ulcer of other part of left lower leg with fat layer exposed: Secondary | ICD-10-CM | POA: Diagnosis not present

## 2020-01-14 NOTE — Progress Notes (Signed)
Cheyenne, Richardson ([id number]) Visit Report for 01/13/2020 Arrival Information Details Patient Name: Date of Service: [name], [name] 01/13/2020 8:00 A M Medical Record Number: [id number] Patient Account Number: [id number] Date of Birth/Sex: Treating RN: 1961/02/02 (59 y.o. [name] Standard Primary Care Gavyn Zoss: Docia Chuck, [place] Other Clinician: Referring Dilia Alemany: Treating Wen Munford/Extender: Richardo Priest, [place] Weeks in Treatment: 15 Visit Information History Since Last Visit Added or deleted any medications: No Patient Arrived: Ambulatory Any new allergies or adverse reactions: No Arrival Time: 08:00 Had a fall or experienced change in No Accompanied By: self activities of daily living that may affect Transfer Assistance: None risk of falls: Patient Identification Verified: Yes Signs or symptoms of abuse/neglect since last visito No Secondary Verification Process Completed: Yes Hospitalized since last visit: No Patient Requires Transmission-Based Precautions: No Implantable device outside of the clinic excluding No Patient Has Alerts: Yes cellular tissue based products placed in the center since last visit: Has Dressing in Place as Prescribed: Yes Pain Present Now: No Electronic Signature(s) Signed: 01/13/2020 1:14:02 PM By: Shawn Stall Entered By: Shawn Stall on 01/13/2020 08:03:02 -------------------------------------------------------------------------------- Encounter Discharge Information Details Patient Name: Date of Service: Cheyenne Aran B. 01/13/2020 8:00 A M Medical Record Number: [id number] Patient Account Number: [id number] Date of Birth/Sex: Treating RN: 1961/01/04 (59 y.o. [name] Standard Primary Care Story Vanvranken: Docia Chuck, [place] Other Clinician: Referring Shaneka Efaw: Treating Mirra Basilio/Extender: Richardo Priest, [place] Weeks in Treatment: 15 Encounter Discharge Information Items Post Procedure Vitals Discharge Condition:  Stable Temperature (F): 98.2 Ambulatory Status: Ambulatory Pulse (bpm): 80 Discharge Destination: Home Respiratory Rate (breaths/min): 20 Transportation: Private Auto Blood Pressure (mmHg): 127/76 Accompanied By: self Schedule Follow-up Appointment: Yes Clinical Summary of Care: Electronic Signature(s) Signed: 01/13/2020 1:14:02 PM By: Shawn Stall Entered By: Shawn Stall on 01/13/2020 08:10:30 -------------------------------------------------------------------------------- Patient/Caregiver Education Details Patient Name: Date of Service: Cheyenne, [name] 5/12/2021andnbsp8:00 A M Medical Record Number: [id number] Patient Account Number: [id number] Date of Birth/Gender: Treating RN: [birth year] (59 y.o. [name] Standard Primary Care Physician: Docia Chuck, [place] Other Clinician: Referring Physician: Treating Physician/Extender: Richardo Priest, [place] Weeks in Treatment: 15 Education Assessment Education Provided To: Patient Education Topics Provided Wound/Skin Impairment: Handouts: Skin Care Do's and Dont's Methods: Explain/Verbal Responses: Reinforcements needed Electronic Signature(s) Signed: 01/13/2020 1:14:02 PM By: Shawn Stall Entered By: Shawn Stall on 01/13/2020 08:10:05 -------------------------------------------------------------------------------- Wound Assessment Details Patient Name: Date of Service: Cheyenne, Richardson 01/13/2020 8:00 A M Medical Record Number: [id number] Patient Account Number: [id number] Date of Birth/Sex: Treating RN: 1961/07/02 (59 y.o. [name] Standard Primary Care Bastion Bolger: Docia Chuck, [place] Other Clinician: Referring Elvyn Krohn: Treating Anthonny Schiller/Extender: Richardo Priest, [place] Weeks in Treatment: 15 Wound Status Wound Number: 2 Primary Etiology: Venous Leg Ulcer Wound Location: Left, Anterior Lower Leg Wound Status: Open Wounding Event: Trauma Comorbid History: Lymphedema, Raynauds, Neuropathy Date Acquired:  08/06/2019 Weeks Of Treatment: 15 Clustered Wound: No Wound Measurements Length: (cm) 5.6 Width: (cm) 4 Depth: (cm) 0.3 Area: (cm) 17.593 Volume: (cm) 5.278 % Reduction in Area: 3.5% % Reduction in Volume: -44.7% Epithelialization: None Tunneling: No Undermining: No Wound Description Classification: Full Thickness With Exposed Support Structures Wound Margin: Epibole Exudate Amount: Medium Exudate Type: Serosanguineous Exudate Color: red, brown Foul Odor After Cleansing: No Slough/Fibrino Yes Wound Bed Granulation Amount: None Present (0%) Exposed Structure Necrotic Amount: Large (67-100%) Fascia Exposed: No Necrotic Quality: Adherent Slough Fat Layer (Subcutaneous Tissue) Exposed: Yes Tendon Exposed: Yes Muscle Exposed: Yes Necrosis of Muscle: No Joint Exposed: No Bone Exposed: No Treatment Notes Wound #2 (Left,  Anterior Lower Leg) 1. Cleanse With Wound Cleanser 3. Primary Dressing Applied Santyl 4. Secondary Dressing Other secondary dressing (specify in notes) Notes secondary telfa pad Electronic Signature(s) Signed: 01/13/2020 1:14:02 PM By: Deon Pilling Signed: 01/14/2020 12:51:17 PM By: Baruch Gouty RN, BSN Entered By: Deon Pilling on 01/13/2020 08:07:45 -------------------------------------------------------------------------------- Vitals Details Patient Name: Date of Service: Cheyenne Mccallum B. 01/13/2020 8:00 A M Medical Record Number: [id number] Patient Account Number: [id number] Date of Birth/Sex: Treating RN: 1961-03-14 (59 y.o. [name] Primary Care Josep Luviano: [name], [place] Other Clinician: Referring Cayson Kalb: Treating Ambera Fedele/Extender: Dayna Ramus, [place] Weeks in Treatment: 15 Vital Signs Time Taken: 08:40 Temperature (F): 98.2 Height (in): 66 Pulse (bpm): 80 Weight (lbs): 156 Respiratory Rate (breaths/min): 20 Body Mass Index (BMI): 25.2 Blood Pressure (mmHg): 127/76 Reference Range: 80 - 120 mg /  dl Electronic Signature(s) Signed: 01/13/2020 1:14:02 PM By: Deon Pilling Entered By: Deon Pilling on 01/13/2020 08:03:19

## 2020-01-20 ENCOUNTER — Encounter (HOSPITAL_BASED_OUTPATIENT_CLINIC_OR_DEPARTMENT_OTHER): Payer: Worker's Compensation | Admitting: Physician Assistant

## 2020-01-25 NOTE — Progress Notes (Signed)
NALY, SCHWANZ ([id number]) Visit Report for 12/17/2019 Arrival Information Details Patient Name: Date of Service: [name], [name] 12/17/2019 1:15 PM Medical Record Number: [id number] Patient Account Number: [id number] Date of Birth/Sex: Treating RN: Jan 07, 1961 (59 y.o. [name] Primary Care Vianey Caniglia: [name], Dibas Other Clinician: Referring Nitisha Civello: Treating Dimitry Holsworth/Extender: Otis Brace, Dibas Weeks in Treatment: 11 Visit Information History Since Last Visit Added or deleted any medications: No Patient Arrived: Ambulatory Any new allergies or adverse reactions: No Arrival Time: 12:56 Had a fall or experienced change in No Accompanied By: self activities of daily living that may affect Transfer Assistance: None risk of falls: Patient Identification Verified: Yes Signs or symptoms of abuse/neglect since last visito No Secondary Verification Process Completed: Yes Hospitalized since last visit: No Patient Requires Transmission-Based Precautions: No Implantable device outside of the clinic excluding No Patient Has Alerts: Yes cellular tissue based products placed in the center since last visit: Has Dressing in Place as Prescribed: Yes Pain Present Now: Yes Electronic Signature(s) Signed: 12/22/2019 1:29:28 PM By: Sandre Kitty Entered By: Sandre Kitty on 12/17/2019 12:57:14 -------------------------------------------------------------------------------- Clinic Level of Care Assessment Details Patient Name: Date of Service: [name], [name] 12/17/2019 1:15 PM Medical Record Number: [id number] Patient Account Number: [id number] Date of Birth/Sex: Treating RN: 26-Jul-1961 (59 y.o. [name] Primary Care Kineta Fudala: [name], Dibas Other Clinician: Referring Khush Pasion: Treating Caira Poche/Extender: Otis Brace, Dibas Weeks in Treatment: 11 Clinic Level of Care Assessment Items TOOL 4 Quantity Score X- 1 0 Use when only an EandM is  performed on FOLLOW-UP visit ASSESSMENTS - Nursing Assessment / Reassessment X- 1 10 Reassessment of Co-morbidities (includes updates in patient status) X- 1 5 Reassessment of Adherence to Treatment Plan ASSESSMENTS - Wound and Skin A ssessment / Reassessment X - Simple Wound Assessment / Reassessment - one wound 1 5 _0  - 0 Complex Wound Assessment / Reassessment - multiple wounds X- 1 10 Dermatologic / Skin Assessment (not related to wound area) ASSESSMENTS - Focused Assessment X- 1 5 Circumferential Edema Measurements - multi extremities X- 1 10 Nutritional Assessment / Counseling / Intervention X- 1 5 Lower Extremity Assessment (monofilament, tuning fork, pulses) _1  - 0 Peripheral Arterial Disease Assessment (using hand held doppler) ASSESSMENTS - Ostomy and/or Continence Assessment and Care _2  - 0 Incontinence Assessment and Management _3  - 0 Ostomy Care Assessment and Management (repouching, etc.) PROCESS - Coordination of Care X - Simple Patient / Family Education for ongoing care 1 15 _4  - 0 Complex (extensive) Patient / Family Education for ongoing care X- 1 10 Staff obtains Programmer, systems, Records, T Results / Process Orders est _5  - 0 Staff telephones HHA, Nursing Homes / Clarify orders / etc _6  - 0 Routine Transfer to another Facility (non-emergent condition) _7  - 0 Routine Hospital Admission (non-emergent condition) _8  - 0 New Admissions / Biomedical engineer / Ordering NPWT Apligraf, etc. , _9  - 0 Emergency Hospital Admission (emergent condition) X- 1 10 Simple Discharge Coordination _10  - 0 Complex (extensive) Discharge Coordination PROCESS - Special Needs _11  - 0 Pediatric / Minor Patient Management _12  - 0 Isolation Patient Management _13  - 0 Hearing / Language / Visual special needs _14  - 0 Assessment of Community assistance (transportation, D/C planning, etc.) _15  - 0 Additional assistance / Altered mentation _16  - 0 Support Surface(s) Assessment  (bed, cushion, seat, etc.) INTERVENTIONS - Wound Cleansing / Measurement X - Simple Wound Cleansing - one wound 1 5 _17  - 0 Complex Wound Cleansing - multiple wounds X- 1 5 Wound  Imaging (photographs - any number of wounds) _0  - 0 Wound Tracing (instead of photographs) X- 1 5 Simple Wound Measurement - one wound _1  - 0 Complex Wound Measurement - multiple wounds INTERVENTIONS - Wound Dressings X - Small Wound Dressing one or multiple wounds 1 10 _2  - 0 Medium Wound Dressing one or multiple wounds _3  - 0 Large Wound Dressing one or multiple wounds X- 1 5 Application of Medications - topical <SHFWYOVZCHYIFOYD>_7<\/AJOINOMVEHMCNOBS>_9  - 0 Application of Medications - injection INTERVENTIONS - Miscellaneous _5  - 0 External ear exam _6  - 0 Specimen Collection (cultures, biopsies, blood, body fluids, etc.) _7  - 0 Specimen(s) / Culture(s) sent or taken to Lab for analysis _8  - 0 Patient Transfer (multiple staff / Civil Service fast streamer / Similar devices) _9  - 0 Simple Staple / Suture removal (25 or less) _10  - 0 Complex Staple / Suture removal (26 or more) _11  - 0 Hypo / Hyperglycemic Management (close monitor of Blood Glucose) _12  - 0 Ankle / Brachial Index (ABI) - do not check if billed separately X- 1 5 Vital Signs Has the patient been seen at the hospital within the last three years: Yes Total Score: 120 Level Of Care: New/Established - Level 4 Electronic Signature(s) Signed: 12/17/2019 4:13:32 PM By: Deon Pilling Entered By: Deon Pilling on 12/17/2019 15:30:19 -------------------------------------------------------------------------------- Encounter Discharge Information Details Patient Name: Date of Service: [name] 12/17/2019 1:15 PM Medical Record Number: [id number] Patient Account Number: [id number] Date of Birth/Sex: Treating RN: [birth year] (59 y.o. [name] Primary Care Ervey Fallin: [name], Dibas Other Clinician: Referring Donnah Levert: Treating Jaydenn Boccio/Extender: Otis Brace,  Dibas Weeks in Treatment: 11 Encounter Discharge Information Items Discharge Condition: Stable Ambulatory Status: Ambulatory Discharge Destination: Home Transportation: Private Auto Accompanied By: alone Schedule Follow-up Appointment: Yes Clinical Summary of Care: Patient Declined Electronic Signature(s) Signed: 12/18/2019 4:22:27 PM By: Levan Hurst RN, BSN Entered By: Levan Hurst on 12/17/2019 14:39:19 -------------------------------------------------------------------------------- Lower Extremity Assessment Details Patient Name: Date of Service: [name] 12/17/2019 1:15 PM Medical Record Number: [id number] Patient Account Number: [id number] Date of Birth/Sex: Treating RN: 29-Jun-1961 (59 y.o. [name] Primary Care Deshara Rossi: [name], Dibas Other Clinician: Referring Ardelia Wrede: Treating Wilbert Schouten/Extender: Otis Brace, Dibas Weeks in Treatment: 11 Edema Assessment Assessed: [Left: No] [Right: No] Edema: [Left: Ye] [Right: s] Calf Left: Right: Point of Measurement: 29 cm From Medial Instep 35.8 cm cm Ankle Left: Right: Point of Measurement: 8 cm From Medial Instep 22.5 cm cm Vascular Assessment Pulses: Dorsalis Pedis Palpable: [Left:Yes] Electronic Signature(s) Signed: 12/18/2019 4:22:27 PM By: Levan Hurst RN, BSN Entered By: Levan Hurst on 12/17/2019 13:10:46 -------------------------------------------------------------------------------- Multi Wound Chart Details Patient Name: Date of Service: [name] 12/17/2019 1:15 PM Medical Record Number: [id number] Patient Account Number: [id number] Date of Birth/Sex: Treating RN: 1961/01/08 (59 y.o. [name] Primary Care Librado Guandique: [name], Dibas Other Clinician: Referring Jeriah Corkum: Treating Algis Lehenbauer/Extender: Otis Brace, Dibas Weeks in Treatment: 11 Vital Signs Height(in): 66 Pulse(bpm): 71 Weight(lbs): 156 Blood Pressure(mmHg): 130/77 Body Mass  Index(BMI): 25 Temperature(F): 97.7 Respiratory Rate(breaths/min): 18 Photos: [2:No Photos Left, Anterior Lower Leg] [N/A:N/A N/A] Wound Location: [2:Trauma] [N/A:N/A] Wounding Event: [2:Venous Leg Ulcer] [N/A:N/A] Primary Etiology: [2:Lymphedema, Raynauds,] [N/A:N/A] Comorbid History: [2:Neuropathy 08/06/2019] [N/A:N/A] Date Acquired: [2:11] [N/A:N/A] Weeks of Treatment: [2:Open] [N/A:N/A] Wound Status: [2:5.1x4x0.3] [N/A:N/A] Measurements L x W x D (cm) [2:16.022] [N/A:N/A] A (cm) : rea [2:4.807] [N/A:N/A] Volume (cm) : [2:12.10%] [N/A:N/A] % Reduction in Area: [2:-31.80%] [N/A:N/A] % Reduction in Volume: [2:Full Thickness Without Exposed] [N/A:N/A] Classification: [2:Support Structures  Medium] [N/A:N/A] Exudate Amount: [2:Serosanguineous] [N/A:N/A] Exudate Type: [2:red, brown] [N/A:N/A] Exudate Color: [2:Distinct, outline attached] [N/A:N/A] Wound Margin: [2:Small (1-33%)] [N/A:N/A] Granulation Amount: [2:Pink] [N/A:N/A] Granulation Quality: [2:Large (67-100%)] [N/A:N/A] Necrotic Amount: [2:Fat Layer (Subcutaneous Tissue)] [N/A:N/A] Exposed Structures: [2:Exposed: Yes Fascia: No Tendon: No Muscle: No Joint: No Bone: No Small (1-33%)] [N/A:N/A] Treatment Notes Electronic Signature(s) Signed: 12/19/2019 6:55:41 AM By: Linton Ham MD Signed: 01/25/2020 1:31:05 PM By: Deon Pilling Entered By: Linton Ham on 12/17/2019 13:45:31 -------------------------------------------------------------------------------- Multi-Disciplinary Care Plan Details Patient Name: Date of Service: [name] 12/17/2019 1:15 PM Medical Record Number: [id number] Patient Account Number: [id number] Date of Birth/Sex: Treating RN: 01-Nov-1960 (59 y.o. [name] Primary Care Siaosi Alter: [name], Dibas Other Clinician: Referring Spring San: Treating Jerrel Tiberio/Extender: Otis Brace, Dibas Weeks in Treatment: 11 Active Inactive Venous Leg Ulcer Nursing Diagnoses: Knowledge  deficit related to disease process and management Potential for venous Insuffiency (use before diagnosis confirmed) Goals: Patient will maintain optimal edema control Date Initiated: 09/30/2019 Target Resolution Date: 01/01/2020 Goal Status: Active Patient/caregiver will verbalize understanding of disease process and disease management Date Initiated: 09/30/2019 Date Inactivated: 11/25/2019 Target Resolution Date: 11/25/2019 Goal Status: Met Interventions: Assess peripheral edema status every visit. Compression as ordered Provide education on venous insufficiency Treatment Activities: Therapeutic compression applied : 09/30/2019 Notes: Wound/Skin Impairment Nursing Diagnoses: Impaired tissue integrity Knowledge deficit related to ulceration/compromised skin integrity Goals: Patient/caregiver will verbalize understanding of skin care regimen Date Initiated: 09/30/2019 Target Resolution Date: 12/31/2019 Goal Status: Active Ulcer/skin breakdown will have a volume reduction of 30% by week 4 Date Initiated: 09/30/2019 Date Inactivated: 10/28/2019 Target Resolution Date: 10/28/2019 Goal Status: Unmet Unmet Reason: other comorbidities Interventions: Assess patient/caregiver ability to obtain necessary supplies Assess patient/caregiver ability to perform ulcer/skin care regimen upon admission and as needed Assess ulceration(s) every visit Provide education on ulcer and skin care Treatment Activities: Skin care regimen initiated : 09/30/2019 Topical wound management initiated : 09/30/2019 Notes: Electronic Signature(s) Signed: 12/17/2019 4:13:32 PM By: Deon Pilling Signed: 01/25/2020 1:31:05 PM By: Deon Pilling Entered By: Deon Pilling on 12/17/2019 13:09:06 -------------------------------------------------------------------------------- Pain Assessment Details Patient Name: Date of Service: [name] 12/17/2019 1:15 PM Medical Record Number: [id number] Patient Account Number:  [id number] Date of Birth/Sex: Treating RN: Aug 26, 1961 (59 y.o. [name] Primary Care Raynold Blankenbaker: [name], Dibas Other Clinician: Referring Nickolaos Brallier: Treating Bryen Hinderman/Extender: Otis Brace, Dibas Weeks in Treatment: 11 Active Problems Location of Pain Severity and Description of Pain Patient Has Paino Yes Site Locations Rate the pain. Current Pain Level: 7 Pain Management and Medication Current Pain Management: Electronic Signature(s) Signed: 12/22/2019 1:29:28 PM By: Sandre Kitty Signed: 01/25/2020 1:31:05 PM By: Deon Pilling Entered By: Sandre Kitty on 12/17/2019 12:59:24 -------------------------------------------------------------------------------- Patient/Caregiver Education Details Patient Name: Date of Service: [name] 4/15/2021andnbsp1:15 PM Medical Record Number: [id number] Patient Account Number: [id number] Date of Birth/Gender: Treating RN: [birth year] (59 y.o. [name] Primary Care Physician: [name], Dibas Other Clinician: Referring Physician: Treating Physician/Extender: Otis Brace, Dibas Weeks in Treatment: 11 Education Assessment Education Provided To: Patient Education Topics Provided Wound/Skin Impairment: Handouts: Skin Care Do's and Dont's Methods: Explain/Verbal Responses: Reinforcements needed Electronic Signature(s) Signed: 12/17/2019 4:13:32 PM By: Deon Pilling Entered By: Deon Pilling on 12/17/2019 13:09:20 -------------------------------------------------------------------------------- Wound Assessment Details Patient Name: Date of Service: [name], [name] 12/17/2019 1:15 PM Medical Record Number: [id number] Patient Account Number: [id number] Date of Birth/Sex: Treating RN: [birth year] (59 y.o. [name] Primary Care Keishia Ground: [place], Dibas Other Clinician: Referring Lamonta Cypress: Treating Aramis Weil/Extender: Otis Brace, Dibas  Weeks in Treatment: 11 Wound  Status Wound Number: 2 Primary Etiology: Venous Leg Ulcer Wound Location: Left, Anterior Lower Leg Wound Status: Open Wounding Event: Trauma Comorbid History: Lymphedema, Raynauds, Neuropathy Date Acquired: 08/06/2019 Weeks Of Treatment: 11 Clustered Wound: No Photos Wound Measurements Length: (cm) 5.1 Width: (cm) 4 Depth: (cm) 0.3 Area: (cm) 16.022 Volume: (cm) 4.807 % Reduction in Area: 12.1% % Reduction in Volume: -31.8% Epithelialization: Small (1-33%) Tunneling: No Undermining: No Wound Description Classification: Full Thickness Without Exposed Support Structures Wound Margin: Distinct, outline attached Exudate Amount: Medium Exudate Type: Serosanguineous Exudate Color: red, brown Foul Odor After Cleansing: No Slough/Fibrino Yes Wound Bed Granulation Amount: Small (1-33%) Exposed Structure Granulation Quality: Pink Fascia Exposed: No Necrotic Amount: Large (67-100%) Fat Layer (Subcutaneous Tissue) Exposed: Yes Necrotic Quality: Adherent Slough Tendon Exposed: No Muscle Exposed: No Joint Exposed: No Bone Exposed: No Electronic Signature(s) Signed: 12/22/2019 1:29:28 PM By: Sandre Kitty Signed: 01/25/2020 1:31:05 PM By: Deon Pilling Entered By: Sandre Kitty on 12/17/2019 15:06:46 -------------------------------------------------------------------------------- Vitals Details Patient Name: Date of Service: [name] 12/17/2019 1:15 PM Medical Record Number: [id number] Patient Account Number: [id number] Date of Birth/Sex: Treating RN: 1961/02/23 (59 y.o. [name] Primary Care Dennise Raabe: [name], Dibas Other Clinician: Referring Zhion Pevehouse: Treating Fable Huisman/Extender: Otis Brace, Dibas Weeks in Treatment: 11 Vital Signs Time Taken: 12:57 Temperature (F): 97.7 Height (in): 66 Pulse (bpm): 71 Weight (lbs): 156 Respiratory Rate (breaths/min): 18 Body Mass Index (BMI): 25.2 Blood Pressure (mmHg): 130/77 Reference Range:  80 - 120 mg / dl Electronic Signature(s) Signed: 12/22/2019 1:29:28 PM By: Sandre Kitty Entered By: Sandre Kitty on 12/17/2019 12:59:13

## 2020-01-27 ENCOUNTER — Encounter: Payer: Self-pay | Admitting: Neurology

## 2020-01-27 ENCOUNTER — Other Ambulatory Visit: Payer: Self-pay | Admitting: Neurology

## 2020-01-27 ENCOUNTER — Encounter (HOSPITAL_BASED_OUTPATIENT_CLINIC_OR_DEPARTMENT_OTHER): Payer: Worker's Compensation | Admitting: Physician Assistant

## 2020-01-27 DIAGNOSIS — L97822 Non-pressure chronic ulcer of other part of left lower leg with fat layer exposed: Secondary | ICD-10-CM | POA: Diagnosis not present

## 2020-01-27 MED ORDER — HYDROCODONE-ACETAMINOPHEN 5-325 MG PO TABS: 1.0000 | ORAL_TABLET | Freq: Four times a day (QID) | ORAL | 0 refills | Status: DC | PRN

## 2020-01-27 NOTE — Progress Notes (Addendum)
[name], [name] ([id number]) Visit Report for 01/27/2020 Chief Complaint Document Details Patient Name: Date of Service: [name], [name] 01/27/2020 8:00 A M Medical Record Number: [id number] Patient Account Number: [id number] Date of Birth/Sex: Treating RN: 03/15/1961 (59 y.o. [name] Primary Care Provider: [name], [place] Other Clinician: Referring Provider: Treating Provider/Extender: Dayna Ramus, Dibas Weeks in Treatment: 17 Information Obtained from: Patient Chief Complaint Left LE Ulcers Electronic Signature(s) Signed: 01/27/2020 8:21:39 AM By: Worthy Keeler PA-C Entered By: Worthy Keeler on 01/27/2020 08:21:39 -------------------------------------------------------------------------------- Debridement Details Patient Name: Date of Service: Cheyenne Mccallum B. 01/27/2020 8:00 A M Medical Record Number: [id number] Patient Account Number: [id number] Date of Birth/Sex: Treating RN: 1961/01/30 (59 y.o. [name] Primary Care Provider: [name], [place]ib[place] Other Clinician: Referring Provider: Treating Provider/Extender: Dayna Ramus, Dibas Weeks in Treatment: 17 Debridement Performed for Assessment: Wound #2 Left,Anterior Lower Leg Performed By: Physician Worthy Keeler, PA Debridement Type: Debridement Severity of Tissue Pre Debridement: Muscle involvement without necrosis Level of Consciousness (Pre-procedure): Awake and Alert Pre-procedure Verification/Time Out Yes - 08:40 Taken: Pain Control: Other : benzocaine 20% spray T Area Debrided (L x W): otal 5.9 (cm) x 4 (cm) = 23.6 (cm) Tissue and other material debrided: Viable, Non-Viable, Slough, Subcutaneous, Slough Level: Skin/Subcutaneous Tissue Debridement Description: Excisional Instrument: Curette Bleeding: Minimum Hemostasis Achieved: Pressure End Time: 08:46 Procedural Pain: 6 Post Procedural Pain: 3 Response to Treatment: Procedure was tolerated well Level of  Consciousness (Post- Awake and Alert procedure): Post Debridement Measurements of Total Wound Length: (cm) 5.9 Width: (cm) 4 Depth: (cm) 0.4 Volume: (cm) 7.414 Character of Wound/Ulcer Post Debridement: Requires Further Debridement Severity of Tissue Post Debridement: Muscle involvement without necrosis Post Procedure Diagnosis Same as Pre-procedure Electronic Signature(s) Signed: 01/27/2020 1:28:30 PM By: Worthy Keeler PA-C Signed: 01/27/2020 2:12:16 PM By: Baruch Gouty RN, BSN Entered By: Baruch Gouty on 01/27/2020 08:48:26 -------------------------------------------------------------------------------- HPI Details Patient Name: Date of Service: Cheyenne Mccallum B. 01/27/2020 8:00 A M Medical Record Number: [id number] Patient Account Number: [id number] Date of Birth/Sex: Treating RN: 05/30/1961 (59 y.o. [name] Primary Care Provider: [name], [place] Other Clinician: Referring Provider: Treating Provider/Extender: Dayna Ramus, Dibas Weeks in Treatment: 17 History of Present Illness HPI Description: 09/30/2019 upon evaluation today patient presents for initial inspection here in our clinic concerning issues that she has been experiencing with a wound on her left anterior lower leg. She unfortunately had a significant laceration which required 19 sutures and she shows me the pictures both before as well as after sutured in a very good job was done in this regard. Unfortunately the skin did not take and necrosis. Subsequently this wound open and she has been having more significant issues with that since. Fortunately there is no evidence of active infection at this time which is good news. No fever chills noted. She has a history of chronic venous stasis but is no longer able to wear any compression in fact she cannot even wear regular socks due to her neuropathy and the pain that she experiences in her feet if she does. For that reason she tries to elevate her  legs as much as she can at nighttime but again she really cannot wear any type of compression. With regard to antibiotic it does not appear that she has been on anything recently. She was told by primary to just let this dry out she states that she really was not convinced that was the best thing to do due  to her previous experience here at the wound care center for that reason she has been trying to work on this on her own. Fortunately there is no evidence of local or systemic infection. No fevers, chills, nausea, vomiting, or diarrhea. 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 10/07/2019 on evaluation today patient presents for follow-up concerning her wound on the left lower extremity. She has been tolerating the dressing changes without complication. With that being said she just got the Santyl 2 days ago. Obviously it has not had enough time to really do what we need to do she would like to obviously give this some time before proceeding with more aggressive debridement or otherwise. 10/21/2019 upon evaluation today patient appears to be doing better with regard to the overall appearance of her wound is not quite as dry I do believe the Santyl has helped her improve to some degree here. Fortunately there is no signs of active infection at this time. I think we may be able to clean off the wound with a little bit of additional debridement today and likely we can transition away from the Santyl to some kind of collagen type dressing. 10/28/2019 upon evaluation today patient actually appears to be doing fairly well with regard to her wound today. She has been tolerating the dressing changes we did switch to collagen and fortunately that seems to be doing well for her at this point. There does not seem to be any signs of active infection I do feel like the surface of the wound is looking better at this time as well. 11/04/2019 upon evaluation today patient appears to be  doing well with regard to her wound. She is making some progress here and the wound does appear to be measuring smaller which is good news. Overall this is good to be somewhat slow but nonetheless I am encouraged by the fact that the collagen does seem to have helped her. 11/11/2019 on evaluation today patient's wound is showing some signs of improvement. The good news is her overall trend does seem to be towards getting better compared to where she was previous. The smaller wounds along the side actually might even be healed but we can watch this 1 more week before I heal this out. Overall I feel like there is improvement here. She is still using the silver collagen. 11/18/2019 upon evaluation today patient appears to be doing about the same with regard to her wound. This is not significantly smaller there still appears to be a lot of inflammation. For that reason I do want to see about actually applying triamcinolone to the actual wound bed to see if this can be of benefit as well. I thought about this for couple weeks but I think it may be a good thing to proceed with at this point. 11/25/2019 on evaluation today patient appears to be doing well with regard to her lower extremity ulcer. She has been tolerating the dressing changes without complication. Fortunately there is no signs of active infection at this time. No fevers, chills, nausea, vomiting, or diarrhea. 12/02/2019 on evaluation today patient appears to be doing okay with regard to her ulcer on the leg. Fortunately there is no signs of active infection at this time. No fever chills noted she has been tolerating the dressing changes without complication. With that being said overall I am pleased with the fact that there is no signs of infection but at the same  time I really feel like she needs more to progress moving week to week and right now were really not seeing that. We have put in for approval for Apligraf which I think could help to move  things along. With that being said we have not heard anything regarding approval at this point. 12/10/2019; patient comes in on my day although have not seen her previously. She has what was a traumatic wound on the left anterior mid tibia. Initially sutured however the area never really healed according to the patient. She has been using Santyl now using Prisma. There is exposed tendon. We have made application for Apligraf apparently this is going through [employer] still has not been approved. She arrives in clinic with erythema around the wound and 4 small circular papules superior to the wound. These are nontender and she is not complaining of any more pain. The exact cause of this is not really clear. The patient has idiopathic peripheral neuropathy which for which she has been extensively evaluated in the past she is not a diabetic she does not have any rheumatologic problems she is aware of. She cannot wear compression because it aggravates her neuropathy 4/15; traumatic wound on the left anterior mid tibia. She arrives today with extensive erythema spreading from the wound medially towards her heel. This is tender swollen and warm compatible with cellulitis. We are using silver collagen to the wound 4/20; 5-day follow-up. Traumatic wound on the left anterior mid tibia. This is in follow-up for the cellulitis she had on her visit 5 days ago. I gave her empiric cephalexin I did not culture this. She arrives today with the erythema that we marked much improved. She has not been systemically unwell Upon evaluation today patient's wound unfortunately is not doing very well this in fact is somewhat deeper than even last week4/28/2021 which is unfortunate. I am very concerned about the fact that to be honest that the patient does not seem to be doing as good as we would have expected in the past 2 weeks since I last saw her. Fortunately there is no evidence of active infection systemically  which is good news. No fevers, chills, nausea, vomiting, or diarrhea. She has been on Keflex. 01/06/2020 upon evaluation today patient unfortunately appears to still be doing somewhat poorly overall in regard to her ulcer. There is definite signs of infection. She has been on Keflex in April but unfortunately this just does not seem to be doing as well as we would like. I do believe that we will get a need to place her on Augmentin in order to try to get this better. She tested positive for Proteus and E. coli on the culture and is allergic to doxycycline. The patient's wound currently is not looking healthy at all to me. 01/27/2020 upon evaluation today patient appears to be doing maybe slightly better with regard to the wound bed at this point. Her infection is definitely improved which is great news. As far as the Apligraf I do not believe the wound surface is quite clean enough yet in order for [place] to apply the Apligraf. Another potential option would be for a snap VAC to be used under the Curlex and Coban wrap working to try the wrap today. If we can get rid of some of the fluid I think this will help her with healing also think the snap VAC could be beneficial in this regard. Nonetheless again I am not given up on the Apligraf is  a possibility I just do not want a waste the applications by putting it on before the wound surface is ready. Potentially the snap VAC can get this wound to the point where it is ready much more quickly. Electronic Signature(s) Signed: 01/27/2020 8:51:29 AM By: Lenda Kelp PA-C Entered By: Lenda Kelp on 01/27/2020 08:51:28 -------------------------------------------------------------------------------- Physical Exam Details Patient Name: Date of Service: [name], [name] 01/27/2020 8:00 A M Medical Record Number: [id number] Patient Account Number: [id number] Date of Birth/Sex: Treating RN: 1960-10-09 (59 y.o. [name] Standard Primary Care Provider: Docia Chuck,  Dibas Other Clinician: Referring Provider: Treating Provider/Extender: Richardo Priest, Dibas Weeks in Treatment: 17 Constitutional Well-nourished and well-hydrated in no acute distress. Respiratory normal breathing without difficulty. Psychiatric this patient is able to make decisions and demonstrates good insight into disease process. Alert and Oriented x 3. pleasant and cooperative. Notes Patient's wound bed did require some sharp debridement today to remove necrotic debris from the surface of the wound. The patient tolerated the debridement with minimal discomfort. Fortunately there is no signs of active infection which is great news she did well with the Augmentin and I am pleased that the infection appears to be cleared. With that being said I think a snap VAC utilization in order to get to the point to where the wound bed is improved and ready for the Apligraf could be extremely beneficial for her. That was discussed with her today were also can submit this to her adjuster for approval to see if that something we could attempt possibly even next week. Electronic Signature(s) Signed: 01/27/2020 8:52:20 AM By: Lenda Kelp PA-C Entered By: Lenda Kelp on 01/27/2020 08:52:20 -------------------------------------------------------------------------------- Physician Orders Details Patient Name: Date of Service: Cheyenne Aran B. 01/27/2020 8:00 A M Medical Record Number: [id number] Patient Account Number: [id number] Date of Birth/Sex: Treating RN: 1960/12/05 (59 y.o. 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 Dressing Change Frequency Wound #2 Left,Anterior Lower Leg Do not change entire dressing for one week. - unless unable to tolerate compression wrap, if not change daily with santyl Wound Cleansing Wound #2 Left,Anterior Lower Leg May shower with protection. May shower and wash wound with soap and water. - scrub gently with gauze if dressing changed Primary Wound Dressing Wound #2 Left,Anterior Lower Leg Iodoflex Wound #4 Left,Proximal,Anterior Lower Leg Iodoflex Secondary Dressing Wound #2 Left,Anterior Lower Leg Dry Gauze Wound #4 Left,Proximal,Anterior Lower Leg Dry Gauze Edema Control Kerlix and Coban - Left Lower Extremity Avoid standing for long periods of time Elevate legs to the level of the heart or above for 30 minutes daily and/or when sitting, a frequency of: - throughout the day Exercise regularly Electronic Signature(s) Signed: 01/27/2020 1:28:30 PM By: Lenda Kelp PA-C Signed: 01/27/2020 2:12:16 PM By: Zenaida Deed RN, BSN Entered By: Zenaida Deed on 01/27/2020 08:46:24 -------------------------------------------------------------------------------- Problem List Details Patient Name: Date of Service: Cheyenne Aran B. 01/27/2020 8:00 A M Medical Record Number: [id number] Patient Account Number:  [id number] Date of Birth/Sex: Treating RN: [birth year] (59 y.o. 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 01/27/2020 8:00 A M Medical Record Number: [id number] Patient Account Number: [id number] Date of Birth/Sex: Treating RN: [birth year] (59 y.o. [name] Standard Primary Care Provider: Docia Chuck, Dibas Other Clinician: Referring Provider: Treating Provider/Extender: Richardo Priest, Dibas Weeks in Treatment: 17 Subjective Chief Complaint Information obtained from Patient Left LE Ulcers History of Present Illness (HPI) 09/30/2019 upon evaluation today patient presents for initial inspection here in our clinic concerning issues that she has been experiencing with a wound on her left anterior lower leg. She unfortunately had a significant laceration which required 19 sutures and she shows me the pictures both before as well as after sutured in a very good job was done in this regard. Unfortunately the skin did not take and necrosis. Subsequently this wound open and she has been having more significant issues with that since. Fortunately there is no evidence of active infection at this time which is good news. No fever chills noted. She has a history of  chronic venous stasis but is no longer able to wear any compression in fact she cannot even wear regular socks due to her neuropathy and the pain that she experiences in her feet if she does. For that reason she tries to elevate her legs as much as she can at nighttime but again she really cannot wear any type of compression. With regard to antibiotic it does not appear that she has been on anything recently. 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 Fortunately there is no evidence of local or systemic infection. No fevers, chills, nausea, vomiting, or diarrhea. 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 10/07/2019 on evaluation today patient presents for follow-up concerning her wound on the left lower extremity. She has been tolerating the dressing changes without complication. With that being said she just got the Santyl 2 days ago. Obviously it has not had enough time to really do what we need to do she would like to obviously give this some time before proceeding with more aggressive debridement or otherwise. 10/21/2019 upon evaluation today patient appears to be doing better with regard to the overall appearance of her wound is not quite as dry I do believe the Santyl has helped her improve to some degree here. Fortunately there is no signs of active infection  at this time. I think we may be able to clean off the wound with a little bit of additional debridement today and likely we can transition away from the Santyl to some kind of collagen type dressing. 10/28/2019 upon evaluation today patient actually appears to be doing fairly well with regard to her wound today. She has been tolerating the dressing changes we did switch to collagen and fortunately that  seems to be doing well for her at this point. There does not seem to be any signs of active infection I do feel like the surface of the wound is looking better at this time as well. 11/04/2019 upon evaluation today patient appears to be doing well with regard to her wound. She is making some progress here and the wound does appear to be measuring smaller which is good news. Overall this is good to be somewhat slow but nonetheless I am encouraged by the fact that the collagen does seem to have helped her. 11/11/2019 on evaluation today patient's wound is showing some signs of improvement. The good news is her overall trend does seem to be towards getting better compared to where she was previous. The smaller wounds along the side actually might even be healed but we can watch this 1 more week before I heal this out. Overall I feel like there is improvement here. She is still using the silver collagen. 11/18/2019 upon evaluation today patient appears to be doing about the same with regard to her wound. This is not significantly smaller there still appears to be a lot of inflammation. For that reason I do want to see about actually applying triamcinolone to the actual wound bed to see if this can be of benefit as well. I thought about this for couple weeks but I think it may be a good thing to proceed with at this point. 11/25/2019 on evaluation today patient appears to be doing well with regard to her lower extremity ulcer. She has been tolerating the dressing changes without complication. Fortunately there is no signs of active infection at this time. No fevers, chills, nausea, vomiting, or diarrhea. 12/02/2019 on evaluation today patient appears to be doing okay with regard to her ulcer on the leg. Fortunately there is no signs of active infection at this time. No fever chills noted she has been tolerating the dressing changes without complication. With that being said overall I am pleased with the fact that  there is no signs of infection but at the same time I really feel like she needs more to progress moving week to week and right now were really not seeing that. We have put in for approval for Apligraf which I think could help to move things along. With that being said we have not heard anything regarding approval at this point. 12/10/2019; patient comes in on my day although have not seen her previously. She has what was a traumatic wound on the left anterior mid tibia. Initially sutured however the area never really healed according to the patient. She has been using Santyl now using Prisma. There is exposed tendon. We have made application for Apligraf apparently this is going through [employer] still has not been approved. She arrives in clinic with erythema around the wound and 4 small circular papules superior to the wound. These are nontender and she is not complaining of any more pain. The exact cause of this is not really clear. The patient has idiopathic peripheral neuropathy which for which she has  been extensively evaluated in the past she is not a diabetic she does not have any rheumatologic problems she is aware of. She cannot wear compression because it aggravates her neuropathy 4/15; traumatic wound on the left anterior mid tibia. She arrives today with extensive erythema spreading from the wound medially towards her heel. This is tender swollen and warm compatible with cellulitis. We are using silver collagen to the wound 4/20; 5-day follow-up. Traumatic wound on the left anterior mid tibia. This is in follow-up for the cellulitis she had on her visit 5 days ago. I gave her empiric cephalexin I did not culture this. She arrives today with the erythema that we marked much improved. She has not been systemically unwell Upon evaluation today patient's wound unfortunately is not doing very well this in fact is somewhat deeper than even last week4/28/2021 which is unfortunate. I  am very concerned about the fact that to be honest that the patient does not seem to be doing as good as we would have expected in the past 2 weeks since I last saw her. Fortunately there is no evidence of active infection systemically which is good news. No fevers, chills, nausea, vomiting, or diarrhea. She has been on Keflex. 01/06/2020 upon evaluation today patient unfortunately appears to still be doing somewhat poorly overall in regard to her ulcer. There is definite signs of infection. She has been on Keflex in April but unfortunately this just does not seem to be doing as well as we would like. I do believe that we will get a need to place her on Augmentin in order to try to get this better. She tested positive for Proteus and E. coli on the culture and is allergic to doxycycline. The patient's wound currently is not looking healthy at all to me. 01/27/2020 upon evaluation today patient appears to be doing maybe slightly better with regard to the wound bed at this point. Her infection is definitely improved which is great news. As far as the Apligraf I do not believe the wound surface is quite clean enough yet in order for [place] to apply the Apligraf. Another potential option would be for a snap VAC to be used under the Curlex and Coban wrap working to try the wrap today. If we can get rid of some of the fluid I think this will help her with healing also think the snap VAC could be beneficial in this regard. Nonetheless again I am not given up on the Apligraf is a possibility I just do not want a waste the applications by putting it on before the wound surface is ready. Potentially the snap VAC can get this wound to the point where it is ready much more quickly. Objective Constitutional Well-nourished and well-hydrated in no acute distress. Vitals Time Taken: 8:01 AM, Height: 66 in, Weight: 156 lbs, BMI: 25.2, Temperature: 98.1 F, Pulse: 88 bpm, Respiratory Rate: 20 breaths/min, Blood  Pressure: 114/73 mmHg. Respiratory normal breathing without difficulty. Psychiatric this patient is able to make decisions and demonstrates good insight into disease process. Alert and Oriented x 3. pleasant and cooperative. General Notes: Patient's wound bed did require some sharp debridement today to remove necrotic debris from the surface of the wound. The patient tolerated the debridement with minimal discomfort. Fortunately there is no signs of active infection which is great news she did well with the Augmentin and I am pleased that the infection appears to be cleared. With that being said I think a snap VAC utilization  in order to get to the point to where the wound bed is improved and ready for the Apligraf could be extremely beneficial for her. That was discussed with her today were also can submit this to her adjuster for approval to see if that something we could attempt possibly even next week. Integumentary (Hair, Skin) Wound #2 status is Open. Original cause of wound was Trauma. The wound is located on the Left,Anterior Lower Leg. The wound measures 5.9cm length x 4cm width x 0.4cm depth; 18.535cm^2 area and 7.414cm^3 volume. There is muscle, tendon, and Fat Layer (Subcutaneous Tissue) Exposed exposed. There is no tunneling or undermining noted. There is a medium amount of serosanguineous drainage noted. The wound margin is epibole. There is small (1-33%) red granulation within the wound bed. There is a large (67-100%) amount of necrotic tissue within the wound bed including Adherent Slough. Wound #4 status is Open. Original cause of wound was Gradually Appeared. The wound is located on the Left,Proximal,Anterior Lower Leg. The wound measures 0.6cm length x 0.5cm width x 0.3cm depth; 0.236cm^2 area and 0.071cm^3 volume. There is Fat Layer (Subcutaneous Tissue) Exposed exposed. There is no tunneling or undermining noted. There is a medium amount of serosanguineous drainage noted. The  wound margin is distinct with the outline attached to the wound base. There is a large (67-100%) amount of necrotic tissue within the wound bed including Adherent Slough. 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 There was a Excisional Skin/Subcutaneous Tissue Debridement with a total area of 23.6 sq cm performed by Lenda Kelp, PA. With the following instrument(s): Curette to remove Viable and Non-Viable tissue/material. Material removed includes Subcutaneous Tissue and Slough and after achieving pain control using Other (benzocaine 20% spray). No specimens were taken. A time out was conducted at 08:40, prior to the start of the procedure. A Minimum amount of bleeding was controlled with Pressure. The procedure was tolerated well with a pain level of 6 throughout and a pain level of 3 following the procedure. Post Debridement Measurements: 5.9cm length x 4cm width x 0.4cm depth; 7.414cm^3 volume. Character of Wound/Ulcer Post Debridement requires further debridement. Severity of Tissue Post Debridement is: Muscle involvement without necrosis. Post procedure Diagnosis Wound #2: Same as Pre-Procedure Plan Follow-up Appointments: Return Appointment in 1 week. Dressing Change Frequency: Wound #2 Left,Anterior Lower Leg: Do not change entire dressing for one week. - unless unable to tolerate compression wrap, if not change daily with  santyl Wound Cleansing: Wound #2 Left,Anterior Lower Leg: May shower with protection. May shower and wash wound with soap and water. - scrub gently with gauze if dressing changed Primary Wound Dressing: Wound #2 Left,Anterior Lower Leg: Iodoflex Wound #4 Left,Proximal,Anterior Lower Leg: Iodoflex Secondary Dressing: Wound #2 Left,Anterior Lower Leg: Dry Gauze Wound #4 Left,Proximal,Anterior Lower Leg: Dry Gauze Edema Control: Kerlix and Coban - Left Lower Extremity Avoid standing for long periods of time Elevate legs to the level of the heart or above for 30 minutes daily and/or when sitting, a frequency of: - throughout the day Exercise regularly 1.  My suggestion at this point is can be that we go ahead and continue to try to manage this wound as best we can and I do not believe the Apligraf is the right thing to do yet. I am going to suggest that we go ahead and utilize Iodoflex underneath a Curlex and Coban wrap to try to help with edema control if she can keep this on I think that we will go a long way to help in this to heal as well but were unsure whether or not she will be able to tolerate it secondary to her significant neuropathy. 2. With regard to if she has to remove the wrap I would recommend that she go back to utilizing the [place] as she was before. 3. I am also can recommend at this time that the patient needs to avoid standing long periods of time and elevate her leg. 4. I would recommend also checking into a snap VAC to see if that can be beneficial in getting her wound bed to fill-in and prepare it for application of the Apligraf. Obviously were not quite at the point of wanting to utilize Apligraf yet I think that would be wasted on her wound at this time although the infection is gone I think the wound bed itself needs a little work. With that being said we are close and I think the snap VAC may help [place] in that regard as well. We will see patient back for reevaluation  in 1 week here in the clinic. If anything worsens or changes patient will contact our office for additional recommendations. Electronic Signature(s) Signed: 01/27/2020 8:53:43 AM By: Lenda Kelp PA-C Entered By: Lenda Kelp on 01/27/2020 08:53:42 -------------------------------------------------------------------------------- SuperBill Details Patient Name: Date of Service: [name] 01/27/2020 Medical Record Number: [id number] Patient Account Number: [id number] Date of Birth/Sex: Treating RN: [birth year] (59 y.o. [name] Standard Primary Care Provider: Docia Chuck, [place] Other Clinician: Referring Provider: Treating Provider/Extender: Richardo Priest, Dibas Weeks in Treatment: 17 Diagnosis Coding ICD-10 Codes Code Description I87.2 Venous insufficiency (chronic) (peripheral) L97.822 Non-pressure chronic ulcer of other part of left lower leg with fat layer exposed L03.116 Cellulitis of left lower limb L97.828 Non-pressure chronic ulcer of other part of left lower leg with other specified severity 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 I73.00 Raynaud's syndrome without gangrene 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, unspecified Facility Procedures CPT4 Code: [id number] Description: 11042 - DEB SUBQ TISSUE 20 SQ CM/< ICD-10 Diagnosis Description L97.828 Non-pressure chronic ulcer of other part of left lower leg with other specified Modifier: severity Quantity: 1 CPT4 Code: [id number] Description: 11045 - DEB SUBQ TISS EA ADDL 20CM ICD-10 Diagnosis Description L97.828 Non-pressure chronic ulcer of other part of left lower leg with other specified Modifier: severity Quantity: 1 Physician Procedures : CPT4 Code Description Modifier [id number] 11042 - WC PHYS SUBQ TISS 20 SQ CM ICD-10 Diagnosis Description L97.828 Non-pressure chronic ulcer of other part of left lower leg with other specified severity Quantity: 1 : [id number] 11045 - WC PHYS SUBQ TISS EA ADDL  20 CM ICD-10 Diagnosis Description L97.828 Non-pressure chronic ulcer of other part of left lower leg with other specified severity Quantity: 1 Electronic Signature(s) Signed: 01/27/2020 8:53:49 AM By: Lenda Kelp PA-C Entered By: Lenda Kelp on 01/27/2020 08:53:49

## 2020-01-27 NOTE — Progress Notes (Signed)
Last given hydrocodone 07/16/2019, 90 tablets. Will send in hydrocodone, 30 tablets refill for patient.

## 2020-01-27 NOTE — Progress Notes (Signed)
[name], [name] ([id number]) Visit Report for 01/13/2020 Debridement Details Patient Name: Date of Service: [name], [name] 01/13/2020 8:00 A M Medical Record Number: [id number] Patient Account Number: [id number] Date of Birth/Sex: Treating RN: 06-07-1961 (59 y.o. [name] Standard Primary Care Provider: Docia Chuck, [place] Other Clinician: Referring Provider: Treating Provider/Extender: Richardo Priest, Dib[place] Weeks in Treatment: 15 Debridement Performed for Assessment: Wound #2 Left,Anterior Lower Leg Performed By: Clinician Shawn Stall, RN Debridement Type: Chemical/Enzymatic/Mechanical Agent Used: Santyl Severity of Tissue Pre Debridement: Fat layer exposed Level of Consciousness (Pre-procedure): Awake and Alert Pre-procedure Verification/Time Out Yes - 08:05 Taken: Start Time: 08:06 Bleeding: None Hemostasis Achieved: Pressure End Time: 08:08 Procedural Pain: 0 Post Procedural Pain: 0 Response to Treatment: Procedure was tolerated well Level of Consciousness (Post- Awake and Alert procedure): Post Debridement Measurements of Total Wound Length: (cm) 5.6 Width: (cm) 4 Depth: (cm) 0.3 Volume: (cm) 5.278 Character of Wound/Ulcer Post Debridement: Requires Further Debridement Severity of Tissue Post Debridement: Fat layer exposed Electronic Signature(s) Signed: 01/13/2020 1:14:02 PM By: Shawn Stall Signed: 01/14/2020 12:51:17 PM By: Zenaida Deed RN, BSN Signed: 01/27/2020 12:40:59 PM By: Lenda Kelp PA-C Entered By: Shawn Stall on 01/13/2020 08:08:45 -------------------------------------------------------------------------------- SuperBill Details Patient Name: Date of Service: [name] 01/13/2020 Medical Record Number: [id number] Patient Account Number: [id number] Date of Birth/Sex: Treating RN: 04-12-1961 (59 y.o. [name] Standard Primary Care Provider: Docia Chuck, [place] Other Clinician: Referring Provider: Treating Provider/Extender: Richardo Priest, [place] Weeks in Treatment: 15 Diagnosis Coding ICD-10 Codes Code Description I87.2 Venous insufficiency (chronic) (peripheral) L97.822 Non-pressure chronic ulcer of other part of left lower leg with fat layer exposed L03.116 Cellulitis of left lower limb L97.828 Non-pressure chronic ulcer of other part of left lower leg with other specified severity 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 I73.00 Raynaud's syndrome without gangrene 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, unspecified Facility Procedures CPT4 Code: [id number] Description: 63785 - DEBRIDE W/O ANES NON SELECT Modifier: Quantity: 1 Electronic Signature(s) Signed: 01/13/2020 1:14:02 PM By: Shawn Stall Signed: 01/27/2020 12:40:59 PM By: Lenda Kelp PA-C Entered By: Shawn Stall on 01/13/2020 08:10:35

## 2020-02-03 ENCOUNTER — Encounter (HOSPITAL_BASED_OUTPATIENT_CLINIC_OR_DEPARTMENT_OTHER): Payer: Worker's Compensation | Attending: Physician Assistant | Admitting: Physician Assistant

## 2020-02-03 DIAGNOSIS — I872 Venous insufficiency (chronic) (peripheral): Secondary | ICD-10-CM | POA: Insufficient documentation

## 2020-02-03 DIAGNOSIS — G609 Hereditary and idiopathic neuropathy, unspecified: Secondary | ICD-10-CM | POA: Insufficient documentation

## 2020-02-03 DIAGNOSIS — L03116 Cellulitis of left lower limb: Secondary | ICD-10-CM | POA: Insufficient documentation

## 2020-02-03 DIAGNOSIS — M3501 Sicca syndrome with keratoconjunctivitis: Secondary | ICD-10-CM | POA: Insufficient documentation

## 2020-02-03 DIAGNOSIS — L97822 Non-pressure chronic ulcer of other part of left lower leg with fat layer exposed: Secondary | ICD-10-CM | POA: Diagnosis not present

## 2020-02-03 DIAGNOSIS — L97322 Non-pressure chronic ulcer of left ankle with fat layer exposed: Secondary | ICD-10-CM | POA: Diagnosis not present

## 2020-02-03 DIAGNOSIS — L97828 Non-pressure chronic ulcer of other part of left lower leg with other specified severity: Secondary | ICD-10-CM | POA: Insufficient documentation

## 2020-02-03 DIAGNOSIS — I7300 Raynaud's syndrome without gangrene: Secondary | ICD-10-CM | POA: Insufficient documentation

## 2020-02-03 NOTE — Progress Notes (Signed)
Cheyenne, Richardson ([id number]) Visit Report for 02/03/2020 Arrival Information Details Patient Name: Date of Service: [name], HICKEY 02/03/2020 8:00 A M Medical Record Number: [id number] Patient Account Number: [id number] Date of Birth/Sex: Treating RN: 1961-03-28 (59 y.o. Clearnce Sorrel Primary Care Kaliah Haddaway: [name], [place] Other Clinician: Referring Victorino Fatzinger: Treating Dereon Corkery/Extender: Dayna Ramus, [place] Weeks in Treatment: 18 Visit Information History Since Last Visit Added or deleted any medications: No Patient Arrived: Ambulatory Any new allergies or adverse reactions: No Arrival Time: 08:03 Had a fall or experienced change in No Accompanied By: self activities of daily living that may affect Transfer Assistance: None risk of falls: Patient Identification Verified: Yes Signs or symptoms of abuse/neglect since last visito No Secondary Verification Process Completed: Yes Hospitalized since last visit: No Patient Requires Transmission-Based Precautions: No Implantable device outside of the clinic excluding No Patient Has Alerts: Yes cellular tissue based products placed in the center since last visit: Has Dressing in Place as Prescribed: Yes Has Compression in Place as Prescribed: Yes Pain Present Now: No Electronic Signature(s) Signed: 02/03/2020 5:19:53 PM By: Kela Millin Entered By: Kela Millin on 02/03/2020 08:04:13 -------------------------------------------------------------------------------- Encounter Discharge Information Details Patient Name: Date of Service: Cheyenne Mccallum B. 02/03/2020 8:00 A M Medical Record Number: [id number] Patient Account Number: [id number] Date of Birth/Sex: Treating RN: 04-27-1961 (59 y.o. [name] Primary Care Falen Lehrmann: [name], [place] Other Clinician: Referring Veleka Djordjevic: Treating Helma Argyle/Extender: Dayna Ramus, [place] Weeks in Treatment: 16 Encounter Discharge Information Items Post  Procedure Vitals Discharge Condition: Stable Temperature (F): 98.3 Ambulatory Status: Ambulatory Pulse (bpm): 97 Discharge Destination: Home Respiratory Rate (breaths/min): 18 Transportation: Private Auto Blood Pressure (mmHg): 131/71 Accompanied By: self Schedule Follow-up Appointment: Yes Clinical Summary of Care: Patient Declined Electronic Signature(s) Signed: 02/03/2020 4:36:43 PM By: Carlene Coria RN Entered By: Carlene Coria on 02/03/2020 09:14:43 -------------------------------------------------------------------------------- Lower Extremity Assessment Details Patient Name: Date of Service: [name], PAONE 02/03/2020 8:00 A M Medical Record Number: [id number] Patient Account Number: [id number] Date of Birth/Sex: Treating RN: 1961-03-15 (59 y.o. Clearnce Sorrel Primary Care Heavan Francom: [name], [place] Other Clinician: Referring Leaner Morici: Treating Brayten Komar/Extender: Dayna Ramus, [place] Weeks in Treatment: 18 Edema Assessment Assessed: [Left: No] [Right: No] Edema: [Left: Ye] [Right: s] Calf Left: Right: Point of Measurement: 29 cm From Medial Instep 34.5 cm cm Ankle Left: Right: Point of Measurement: 8 cm From Medial Instep 24.5 cm cm Vascular Assessment Pulses: Dorsalis Pedis Palpable: [Left:Yes] Electronic Signature(s) Signed: 02/03/2020 5:19:53 PM By: Kela Millin Entered By: Kela Millin on 02/03/2020 57:26:20 -------------------------------------------------------------------------------- Multi-Disciplinary Care Plan Details Patient Name: Date of Service: Cheyenne Mccallum B. 02/03/2020 8:00 A M Medical Record Number: [id number] Patient Account Number: [id number] Date of Birth/Sex: Treating RN: Feb 03, 1961 (59 y.o. [name] Primary Care Albie Arizpe: [name], [place] Other Clinician: Referring Markey Deady: Treating Orpheus Hayhurst/Extender: Dayna Ramus, [place] Weeks in Treatment: 18 Active Inactive Venous Leg Ulcer Nursing  Diagnoses: Knowledge deficit related to disease process and management Potential for venous Insuffiency (use before diagnosis confirmed) Goals: Patient will maintain optimal edema control Date Initiated: 09/30/2019 Target Resolution Date: 02/26/2020 Goal Status: Active Patient/caregiver will verbalize understanding of disease process and disease management Date Initiated: 09/30/2019 Date Inactivated: 11/25/2019 Target Resolution Date: 11/25/2019 Goal Status: Met Interventions: Assess peripheral edema status every visit. Compression as ordered Provide education on venous insufficiency Treatment Activities: Therapeutic compression applied : 09/30/2019 Notes: Wound/Skin Impairment Nursing Diagnoses: Impaired tissue integrity Knowledge deficit related to ulceration/compromised skin integrity Goals: Patient/caregiver will verbalize understanding of skin care  regimen Date Initiated: 09/30/2019 Target Resolution Date: 02/26/2020 Goal Status: Active Ulcer/skin breakdown will have a volume reduction of 30% by week 4 Date Initiated: 09/30/2019 Date Inactivated: 10/28/2019 Target Resolution Date: 10/28/2019 Goal Status: Unmet Unmet Reason: other comorbidities Interventions: Assess patient/caregiver ability to obtain necessary supplies Assess patient/caregiver ability to perform ulcer/skin care regimen upon admission and as needed Assess ulceration(s) every visit Provide education on ulcer and skin care Treatment Activities: Skin care regimen initiated : 09/30/2019 Topical wound management initiated : 09/30/2019 Notes: Electronic Signature(s) Signed: 02/03/2020 5:52:33 PM By: Baruch Gouty RN, BSN Entered By: Baruch Gouty on 02/03/2020 07:58:55 -------------------------------------------------------------------------------- Negative Pressure Wound Therapy Application (NPWT) Details Patient Name: Date of Service: Cheyenne, Richardson 02/03/2020 8:00 A M Medical Record Number:  [id number] Patient Account Number: [id number] Date of Birth/Sex: Treating RN: [birth year] (59 y.o. [name] Primary Care Shavell Nored: [name], [place] Other Clinician: Referring Harrington Jobe: Treating Hal Norrington/Extender: Dayna Ramus, [place] Weeks in Treatment: 18 NPWT Application Performed for: Wound #2 Left, Anterior Lower Leg Additional Injuries Covered: No Performed By: Baruch Gouty, RN Type: Other Coverage Size (sq cm): 20.4 Pressure Type: Constant Pressure Setting: 125 mmHG Drain Type: None Quantity of Sponges/Gauze Inserted: 1 Sponge/Dressing Type: Foam, Green Date Initiated: 02/03/2020 Response to Treatment: good Post Procedure Diagnosis Same as Pre-procedure Notes SNAP VAC Electronic Signature(s) Signed: 02/03/2020 5:52:33 PM By: Baruch Gouty RN, BSN Entered By: Baruch Gouty on 02/03/2020 08:32:46 -------------------------------------------------------------------------------- Pain Assessment Details Patient Name: Date of Service: [name] 02/03/2020 8:00 A M Medical Record Number: [id number] Patient Account Number: [id number] Date of Birth/Sex: Treating RN: Sep 24, 1960 (59 y.o. Clearnce Sorrel Primary Care Kanyia Heaslip: [name], [place] Other Clinician: Referring Perfecto Purdy: Treating Alexcia Schools/Extender: Dayna Ramus, [place] Weeks in Treatment: 51 Active Problems Location of Pain Severity and Description of Pain Patient Has Paino No Site Locations Pain Management and Medication Current Pain Management: Electronic Signature(s) Signed: 02/03/2020 5:19:53 PM By: Kela Millin Entered By: Kela Millin on 02/03/2020 08:04:58 -------------------------------------------------------------------------------- Patient/Caregiver Education Details Patient Name: Date of Service: Wiggs, Kathelene B. 6/2/2021andnbsp8:00 Bear Record Number: [id number] Patient Account Number: [id number] Date of Birth/Gender: Treating RN: 06-23-1961  (59 y.o. [name] Primary Care Physician: [name], [place] Other Clinician: Referring Physician: Treating Physician/Extender: Dayna Ramus, [place] Weeks in Treatment: 67 Education Assessment Education Provided To: Patient Education Topics Provided Venous: Methods: Explain/Verbal Responses: Reinforcements needed, State content correctly Wound/Skin Impairment: Methods: Explain/Verbal Responses: Reinforcements needed, State content correctly Electronic Signature(s) Signed: 02/03/2020 5:52:33 PM By: Baruch Gouty RN, BSN Entered By: Baruch Gouty on 02/03/2020 07:59:16 -------------------------------------------------------------------------------- Wound Assessment Details Patient Name: Date of Service: [name] 02/03/2020 8:00 A M Medical Record Number: [id number] Patient Account Number: [id number] Date of Birth/Sex: Treating RN: 04/22/1961 (59 y.o. Clearnce Sorrel Primary Care Alysse Rathe: [name], [place] Other Clinician: Referring Arlin Savona: Treating Jabori Henegar/Extender: Dayna Ramus, [place] Weeks in Treatment: 18 Wound Status Wound Number: 2 Primary Etiology: Venous Leg Ulcer Wound Location: Left, Anterior Lower Leg Wound Status: Open Wounding Event: Trauma Comorbid History: Lymphedema, Raynauds, Neuropathy Date Acquired: 08/06/2019 Weeks Of Treatment: 18 Clustered Wound: No Wound Measurements Length: (cm) 5.1 Width: (cm) 4 Depth: (cm) 0.4 Area: (cm) 16.022 Volume: (cm) 6.409 % Reduction in Area: 12.1% % Reduction in Volume: -75.7% Epithelialization: None Tunneling: No Undermining: No Wound Description Classification: Full Thickness With Exposed Support Structures Wound Margin: Epibole Exudate Amount: Medium Exudate Type: Serous Exudate Color: amber Foul Odor After Cleansing: No Slough/Fibrino Yes Wound Bed Granulation Amount: Small (1-33%) Exposed Structure  Granulation Quality: Red Fascia Exposed: No Necrotic  Amount: Large (67-100%) Fat Layer (Subcutaneous Tissue) Exposed: Yes Necrotic Quality: Adherent Slough Tendon Exposed: Yes Muscle Exposed: Yes Necrosis of Muscle: No Joint Exposed: No Bone Exposed: No Treatment Notes Wound #2 (Left, Anterior Lower Leg) 1. Cleanse With Wound Cleanser Soap and water 6. Support Layer Applied Kerlix/Coban Notes snap vac Horticulturist, commercial) Signed: 02/03/2020 5:19:53 PM By: Kela Millin Entered By: Kela Millin on 02/03/2020 98:92:11 -------------------------------------------------------------------------------- Wound Assessment Details Patient Name: Date of Service: [name] 02/03/2020 8:00 A M Medical Record Number: [id number] Patient Account Number: [id number] Date of Birth/Sex: Treating RN: Oct 12, 1960 (59 y.o. Clearnce Sorrel Primary Care Niyanna Asch: [name], [place] Other Clinician: Referring Nakeshia Waldeck: Treating Macio Kissoon/Extender: Dayna Ramus, [place] Weeks in Treatment: 18 Wound Status Wound Number: 4 Primary Etiology: Venous Leg Ulcer Wound Location: Left, Proximal, Anterior Lower Leg Wound Status: Open Wounding Event: Gradually Appeared Comorbid History: Lymphedema, Raynauds, Neuropathy Date Acquired: 01/20/2020 Weeks Of Treatment: 1 Clustered Wound: No Wound Measurements Length: (cm) 0.6 Width: (cm) 0.4 Depth: (cm) 0.2 Area: (cm) 0.188 Volume: (cm) 0.038 % Reduction in Area: 20.3% % Reduction in Volume: 46.5% Epithelialization: None Tunneling: No Undermining: No Wound Description Classification: Full Thickness Without Exposed Support Structu Wound Margin: Distinct, outline attached Exudate Amount: Medium Exudate Type: Serous Exudate Color: amber res Foul Odor After Cleansing: No Slough/Fibrino Yes Wound Bed Granulation Amount: None Present (0%) Exposed Structure Necrotic Amount: Large (67-100%) Fascia Exposed: No Necrotic Quality: Adherent Slough Fat Layer (Subcutaneous  Tissue) Exposed: Yes Tendon Exposed: No Muscle Exposed: No Joint Exposed: No Bone Exposed: No Electronic Signature(s) Signed: 02/03/2020 5:19:53 PM By: Kela Millin Entered By: Kela Millin on 02/03/2020 08:09:38 -------------------------------------------------------------------------------- Wound Assessment Details Patient Name: Date of Service: [name] 02/03/2020 8:00 A M Medical Record Number: [id number] Patient Account Number: [id number] Date of Birth/Sex: Treating RN: 03-Aug-1961 (59 y.o. [name] Primary Care Rainey Kahrs: [name], [place] Other Clinician: Referring Delaney Perona: Treating Davi Kroon/Extender: Dayna Ramus, [place] Weeks in Treatment: 18 Wound Status Wound Number: 5 Primary Etiology: Venous Leg Ulcer Wound Location: Left, Medial Malleolus Wound Status: Open Wounding Event: Bump Comorbid History: Lymphedema, Raynauds, Neuropathy Date Acquired: 02/03/2020 Weeks Of Treatment: 0 Clustered Wound: No Wound Measurements Length: (cm) 0.3 Width: (cm) 0.3 Depth: (cm) 0.1 Area: (cm) 0.071 Volume: (cm) 0.007 % Reduction in Area: % Reduction in Volume: Epithelialization: Small (1-33%) Tunneling: No Undermining: No Wound Description Classification: Full Thickness Without Exposed Support Structures Wound Margin: Flat and Intact Exudate Amount: Small Exudate Type: Purulent Exudate Color: yellow, brown, green Foul Odor After Cleansing: No Slough/Fibrino No Wound Bed Granulation Amount: Small (1-33%) Exposed Structure Granulation Quality: Red Fascia Exposed: No Necrotic Amount: Large (67-100%) Fat Layer (Subcutaneous Tissue) Exposed: Yes Necrotic Quality: Adherent Slough Tendon Exposed: No Muscle Exposed: No Joint Exposed: No Bone Exposed: No Electronic Signature(s) Signed: 02/03/2020 5:52:33 PM By: Baruch Gouty RN, BSN Entered By: Baruch Gouty on 02/03/2020  08:27:13 -------------------------------------------------------------------------------- Vitals Details Patient Name: Date of Service: Cheyenne Mccallum B. 02/03/2020 8:00 A M Medical Record Number: [id number] Patient Account Number: [id number] Date of Birth/Sex: Treating RN: 1960/09/21 (59 y.o. Clearnce Sorrel Primary Care Nikan Ellingson: [name], [place] Other Clinician: Referring Zoeie Ritter: Treating Thermon Zulauf/Extender: Dayna Ramus, [place] Weeks in Treatment: 18 Vital Signs Time Taken: 08:04 Temperature (F): 98.3 Height (in): 66 Pulse (bpm): 97 Weight (lbs): 156 Respiratory Rate (breaths/min): 18 Body Mass Index (BMI): 25.2 Blood Pressure (mmHg): 131/71 Reference Range: 80 - 120 mg / dl Electronic Signature(s) Signed:  02/03/2020 5:19:53 PM By: Kela Millin Entered By: Kela Millin on 02/03/2020 08:04:52

## 2020-02-03 NOTE — Progress Notes (Addendum)
Cheyenne, Richardson ([id number]) Visit Report for 02/03/2020 Chief Complaint Document Details Patient Name: Date of Service: Cheyenne, Richardson 02/03/2020 8:00 A M Medical Record Number: [id number] Patient Account Number: [id number] Date of Birth/Sex: Treating RN: 09-20-1960 (59 y.o. [name] Primary Care Provider: Docia Chuck, [place] Other Clinician: Referring Provider: Treating Provider/Extender: Richardo Priest, [place] Weeks in Treatment: 18 Information Obtained from: Patient Chief Complaint Left LE Ulcers Electronic Signature(s) Signed: 02/03/2020 8:06:24 AM By: Lenda Kelp PA-C Entered By: Lenda Kelp on 02/03/2020 08:06:23 -------------------------------------------------------------------------------- Debridement Details Patient Name: Date of Service: Cheyenne Aran B. 02/03/2020 8:00 A M Medical Record Number: [id number] Patient Account Number: [id number] Date of Birth/Sex: Treating RN: Jan 24, 1961 (59 y.o. [name] Primary Care Provider: Docia Chuck, [place] Other Clinician: Referring Provider: Treating Provider/Extender: Richardo Priest, [place] Weeks in Treatment: 18 Debridement Performed for Assessment: Wound #5 Left,Medial Malleolus Performed By: Physician Lenda Kelp, PA Debridement Type: Debridement Severity of Tissue Pre Debridement: Fat layer exposed Level of Consciousness (Pre-procedure): Awake and Alert Pre-procedure Verification/Time Out Yes - 08:25 Taken: Start Time: 08:25 Pain Control: Other : benzocaine 20 % spray T Area Debrided (L x W): otal 0.3 (cm) x 0.3 (cm) = 0.09 (cm) Tissue and other material debrided: Viable, Non-Viable, Slough, Subcutaneous, Fibrin/Exudate, Slough Level: Skin/Subcutaneous Tissue Debridement Description: Excisional Instrument: Curette Bleeding: None End Time: 08:27 Procedural Pain: 4 Post Procedural Pain: 2 Response to Treatment: Procedure was tolerated well Level of Consciousness (Post- Awake and  Alert procedure): Post Debridement Measurements of Total Wound Length: (cm) 0.3 Width: (cm) 0.3 Depth: (cm) 0.1 Volume: (cm) 0.007 Character of Wound/Ulcer Post Debridement: Improved Severity of Tissue Post Debridement: Fat layer exposed Post Procedure Diagnosis Same as Pre-procedure Electronic Signature(s) Signed: 02/03/2020 4:50:50 PM By: Lenda Kelp PA-C Signed: 02/03/2020 5:52:33 PM By: Zenaida Deed RN, BSN Entered By: Zenaida Deed on 02/03/2020 08:29:21 -------------------------------------------------------------------------------- Debridement Details Patient Name: Date of Service: Cheyenne Aran B. 02/03/2020 8:00 A M Medical Record Number: [id number] Patient Account Number: [id number] Date of Birth/Sex: Treating RN: Nov 06, 1960 (59 y.o. [name] Primary Care Provider: Docia Chuck, [place] Other Clinician: Referring Provider: Treating Provider/Extender: Richardo Priest, [place] Weeks in Treatment: 18 Debridement Performed for Assessment: Wound #2 Left,Anterior Lower Leg Performed By: Physician Lenda Kelp, PA Debridement Type: Debridement Severity of Tissue Pre Debridement: Fat layer exposed Level of Consciousness (Pre-procedure): Awake and Alert Pre-procedure Verification/Time Out Yes - 08:25 Taken: Start Time: 08:28 Pain Control: Other : benzocaine 20 % spray T Area Debrided (L x W): otal 5.1 (cm) x 4 (cm) = 20.4 (cm) Tissue and other material debrided: Viable, Non-Viable, Slough, Subcutaneous, Slough Level: Skin/Subcutaneous Tissue Debridement Description: Excisional Instrument: Curette Bleeding: Minimum Hemostasis Achieved: Pressure End Time: 08:30 Procedural Pain: 4 Post Procedural Pain: 2 Response to Treatment: Procedure was tolerated well Level of Consciousness (Post- Awake and Alert procedure): Post Debridement Measurements of Total Wound Length: (cm) 5.1 Width: (cm) 4 Depth: (cm) 0.4 Volume: (cm) 6.409 Character of  Wound/Ulcer Post Debridement: Improved Severity of Tissue Post Debridement: Fat layer exposed Post Procedure Diagnosis Same as Pre-procedure Electronic Signature(s) Signed: 02/03/2020 4:50:50 PM By: Lenda Kelp PA-C Signed: 02/03/2020 5:52:33 PM By: Zenaida Deed RN, BSN Entered By: Zenaida Deed on 02/03/2020 08:30:37 -------------------------------------------------------------------------------- Debridement Details Patient Name: Date of Service: Cheyenne Aran B. 02/03/2020 8:00 A M Medical Record Number: [id number] Patient Account Number: [id number] Date of Birth/Sex: Treating RN: [birth year] (59 y.o. [name] Primary Care Provider: Docia Chuck, [place] Other Clinician: Referring  Provider: Treating Provider/Extender: Richardo Priest, [place] Weeks in Treatment: 18 Debridement Performed for Assessment: Wound #4 Left,Proximal,Anterior Lower Leg Performed By: Physician Lenda Kelp, PA Debridement Type: Debridement Severity of Tissue Pre Debridement: Fat layer exposed Level of Consciousness (Pre-procedure): Awake and Alert Pre-procedure Verification/Time Out Yes - 08:25 Taken: Start Time: 08:25 Pain Control: Other : benzocaine 20 % spray T Area Debrided (L x W): otal 0.6 (cm) x 0.4 (cm) = 0.24 (cm) Tissue and other material debrided: Viable, Non-Viable, Slough, Subcutaneous, Slough Level: Skin/Subcutaneous Tissue Debridement Description: Excisional Instrument: Curette Bleeding: None End Time: 08:27 Procedural Pain: 4 Post Procedural Pain: 2 Response to Treatment: Procedure was tolerated well Level of Consciousness (Post- Awake and Alert procedure): Post Debridement Measurements of Total Wound Length: (cm) 0.6 Width: (cm) 0.4 Depth: (cm) 0.2 Volume: (cm) 0.038 Character of Wound/Ulcer Post Debridement: Improved Severity of Tissue Post Debridement: Fat layer exposed Post Procedure Diagnosis Same as Pre-procedure Electronic Signature(s) Signed:  02/03/2020 4:50:50 PM By: Lenda Kelp PA-C Signed: 02/03/2020 5:52:33 PM By: Zenaida Deed RN, BSN Entered By: Zenaida Deed on 02/03/2020 08:39:09 -------------------------------------------------------------------------------- HPI Details Patient Name: Date of Service: Cheyenne Aran B. 02/03/2020 8:00 A M Medical Record Number: [id number] Patient Account Number: [id number] Date of Birth/Sex: Treating RN: [birth year] (59 y.o. [name] Primary Care Provider: Docia Chuck, [place] Other Clinician: Referring Provider: Treating Provider/Extender: Richardo Priest, [place] Weeks in Treatment: 18 History of Present Illness HPI Description: 09/30/2019 upon evaluation today patient presents for initial inspection here in our clinic concerning issues that she has been experiencing with a wound on her left anterior lower leg. She unfortunately had a significant laceration which required 19 sutures and she shows me the pictures both before as well as after sutured in a very good job was done in this regard. Unfortunately the skin did not take and necrosis. Subsequently this wound open and she has been having more significant issues with that since. Fortunately there is no evidence of active infection at this time which is good news. No fever chills noted. She has a history of chronic venous stasis but is no longer able to wear any compression in fact she cannot even wear regular socks due to her neuropathy and the pain that she experiences in her feet if she does. For that reason she tries to elevate her legs as much as she can at nighttime but again she really cannot wear any type of compression. With regard to antibiotic it does not appear that she has been on anything recently. 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 Fortunately there is no evidence of local or systemic infection. No fevers, chills, nausea, vomiting, or diarrhea. 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 10/07/2019 on evaluation today patient presents for follow-up concerning her wound on the left lower extremity. She has been tolerating the dressing changes without complication. With that being said she just got the Santyl 2 days ago. Obviously it has not had enough time to really do what we need to do she would like to obviously give this some time before proceeding with more aggressive debridement or otherwise. 10/21/2019 upon evaluation today patient appears to be doing  better with regard to the overall appearance of her wound is not quite as dry I do believe the Santyl has helped her improve to some degree here. Fortunately there is no signs of active infection at this time. I think we may be able to clean off the wound with a little bit of additional debridement today and likely we can transition away from the Santyl to some kind of collagen type dressing. 10/28/2019 upon evaluation today patient actually appears to be doing fairly well with regard to her wound today. She has been tolerating the dressing changes we did switch to collagen and fortunately that seems to be doing well for her at this point. There does not seem to be any signs of active infection I do feel like the surface of the wound is looking better at this time as well. 11/04/2019 upon evaluation today patient appears to be doing well with regard to her wound. She is making some progress here and the wound does appear to be measuring smaller which is good news. Overall this is good to be somewhat slow but nonetheless I am encouraged by the fact that the collagen does seem to have helped her. 11/11/2019 on evaluation today patient's wound is showing some signs of improvement. The good news is her overall trend  does seem to be towards getting better compared to where she was previous. The smaller wounds along the side actually might even be healed but we can watch this 1 more week before I heal this out. Overall I feel like there is improvement here. She is still using the silver collagen. 11/18/2019 upon evaluation today patient appears to be doing about the same with regard to her wound. This is not significantly smaller there still appears to be a lot of inflammation. For that reason I do want to see about actually applying triamcinolone to the actual wound bed to see if this can be of benefit as well. I thought about this for couple weeks but I think it may be a good thing to proceed with at this point. 11/25/2019 on evaluation today patient appears to be doing well with regard to her lower extremity ulcer. She has been tolerating the dressing changes without complication. Fortunately there is no signs of active infection at this time. No fevers, chills, nausea, vomiting, or diarrhea. 12/02/2019 on evaluation today patient appears to be doing okay with regard to her ulcer on the leg. Fortunately there is no signs of active infection at this time. No fever chills noted she has been tolerating the dressing changes without complication. With that being said overall I am pleased with the fact that there is no signs of infection but at the same time I really feel like she needs more to progress moving week to week and right now were really not seeing that. We have put in for approval for Apligraf which I think could help to move things along. With that being said we have not heard anything regarding approval at this point. 12/10/2019; patient comes in on my day although have not seen her previously. She has what was a traumatic wound on the left anterior mid tibia. Initially sutured however the area never really healed according to the patient. She has been using Santyl now using Prisma. There is exposed tendon. We  have made application for Apligraf apparently this is going through [employer] still has not been approved. She arrives in clinic with erythema around the wound and 4 small circular papules  superior to the wound. These are nontender and she is not complaining of any more pain. The exact cause of this is not really clear. The patient has idiopathic peripheral neuropathy which for which she has been extensively evaluated in the past she is not a diabetic she does not have any rheumatologic problems she is aware of. She cannot wear compression because it aggravates her neuropathy 4/15; traumatic wound on the left anterior mid tibia. She arrives today with extensive erythema spreading from the wound medially towards her heel. This is tender swollen and warm compatible with cellulitis. We are using silver collagen to the wound 4/20; 5-day follow-up. Traumatic wound on the left anterior mid tibia. This is in follow-up for the cellulitis she had on her visit 5 days ago. I gave her empiric cephalexin I did not culture this. She arrives today with the erythema that we marked much improved. She has not been systemically unwell Upon evaluation today patient's wound unfortunately is not doing very well this in fact is somewhat deeper than even last week4/28/2021 which is unfortunate. I am very concerned about the fact that to be honest that the patient does not seem to be doing as good as we would have expected in the past 2 weeks since I last saw her. Fortunately there is no evidence of active infection systemically which is good news. No fevers, chills, nausea, vomiting, or diarrhea. She has been on Keflex. 01/06/2020 upon evaluation today patient unfortunately appears to still be doing somewhat poorly overall in regard to her ulcer. There is definite signs of infection. She has been on Keflex in April but unfortunately this just does not seem to be doing as well as we would like. I do believe that we  will get a need to place her on Augmentin in order to try to get this better. She tested positive for Proteus and E. coli on the culture and is allergic to doxycycline. The patient's wound currently is not looking healthy at all to me. 01/27/2020 upon evaluation today patient appears to be doing maybe slightly better with regard to the wound bed at this point. Her infection is definitely improved which is great news. As far as the Apligraf I do not believe the wound surface is quite clean enough yet in order for [place] to apply the Apligraf. Another potential option would be for a snap VAC to be used under the Curlex and Coban wrap working to try the wrap today. If we can get rid of some of the fluid I think this will help her with healing also think the snap VAC could be beneficial in this regard. Nonetheless again I am not given up on the Apligraf is a possibility I just do not want a waste the applications by putting it on before the wound surface is ready. Potentially the snap VAC can get this wound to the point where it is ready much more quickly. 02/03/2020 upon evaluation today patient appears to be doing excellent with regard to her leg ulcer compared to where things have been. The overall quality of the wound bed is significantly improved and very pleased in this regard. We did get approval for the snap VAC which I am wanting to go ahead and see about initiating at this point. We also have approval for the Apligraf when and if the time is appropriate for this. With that being said I really think using the snap VAC to try to get this to fill-in would be appropriate  to begin with. The patient is in agreement with that plan. I do believe the compression did help her and she states that that was uncomfortable she was able to manage with that for now. Electronic Signature(s) Signed: 02/03/2020 8:36:48 AM By: Lenda Kelp PA-C Entered By: Lenda Kelp on 02/03/2020  08:36:48 -------------------------------------------------------------------------------- Physical Exam Details Patient Name: Date of Service: MARYBEL, [name] 02/03/2020 8:00 A M Medical Record Number: [id number] Patient Account Number: [id number] Date of Birth/Sex: Treating RN: September 07, 1960 (59 y.o. [name] Primary Care Provider: Docia Chuck, [place] Other Clinician: Referring Provider: Treating Provider/Extender: Richardo Priest, [place] Weeks in Treatment: 78 Constitutional Well-nourished and well-hydrated in no acute distress. Respiratory normal breathing without difficulty. Psychiatric this patient is able to make decisions and demonstrates good insight into disease process. Alert and Oriented x 3. pleasant and cooperative. Notes Upon inspection patient's wound bed actually showed signs of better granulation there was some need for sharp debridement at all 3 wound locations today which the patient tolerated without complication. Post debridement wound beds appear to be doing significantly better which is great news. Electronic Signature(s) Signed: 02/03/2020 8:38:01 AM By: Lenda Kelp PA-C Entered By: Lenda Kelp on 02/03/2020 08:38:00 -------------------------------------------------------------------------------- Physician Orders Details Patient Name: Date of Service: Cheyenne Aran B. 02/03/2020 8:00 A M Medical Record Number: [id number] Patient Account Number: [id number] Date of Birth/Sex: Treating RN: 09-Oct-1960 (59 y.o. 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 Dressing Change Frequency Wound #4 Left,Proximal,Anterior Lower Leg Do not change entire dressing for one week. - unless unable to tolerate compression wrap, if not change daily with santyl Wound #5 Left,Medial Malleolus Do not change entire dressing for one week. - unless unable to tolerate compression wrap, if not change daily with santyl Wound #2 Left,Anterior Lower Leg Do not change entire dressing for one week. - if wrap removed leave SNAP VAC in place Skin Barriers/Peri-Wound Care Moisturizing lotion Wound Cleansing Wound #2 Left,Anterior Lower Leg May shower with protection. May shower and wash wound with soap and water. - scrub gently with gauze if dressing changed Primary Wound Dressing Wound #4 Left,Proximal,Anterior Lower Leg Iodoflex Wound #5 Left,Medial Malleolus Iodoflex Secondary Dressing Wound #5 Left,Medial Malleolus Dry Gauze Wound #4 Left,Proximal,Anterior Lower Leg Dry Gauze Negative Presssure Wound Therapy Wound #2 Left,Anterior Lower Leg SNAP Vac to wound continuously at 174mm/hg pressure Edema Control Kerlix and Coban - Left Lower Extremity Avoid standing for long periods of time Elevate legs to the level of the heart or above for 30 minutes daily and/or when sitting, a frequency of: - throughout the day Exercise regularly Electronic Signature(s) Signed: 02/03/2020 4:50:50 PM By: Lenda Kelp PA-C Signed: 02/03/2020 5:52:33 PM By: Zenaida Deed RN, BSN Entered By: Zenaida Deed on 02/03/2020 08:37:00 -------------------------------------------------------------------------------- Problem List Details Patient Name: Date of Service: Cheyenne Aran B. 02/03/2020 8:00  A M Medical Record Number: [id number] Patient Account Number: [id number] Date of Birth/Sex: Treating RN: 1961/06/13 (59 y.o. F) Boeh[place],  [facility] Primary Care Provider: [name], [place] Other Clinician: Referring Provider: Treating Provider/Extender: Dayna Ramus, [place] Weeks in Treatment: 18 Active Problems ICD-10 Encounter Code Description Active Date MDM Diagnosis I87.2 Venous insufficiency (chronic) (peripheral) 09/30/2019 No Yes L97.822 Non-pressure chronic ulcer of other part of left lower leg with fat layer exposed 09/30/2019 No Yes L03.116 Cellulitis of left lower limb 12/17/2019 No Yes L97.828 Non-pressure chronic ulcer of other part of left lower leg with other specified 09/30/2019 No Yes severity L97.322 Non-pressure chronic ulcer of left ankle with fat layer exposed 02/03/2020 No Yes 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 02/03/2020 8:00 A M Medical Record Number: [id number] Patient Account Number: [id number] Date of Birth/Sex: Treating RN: 1960/10/28 (59 y.o. [name] Primary Care Provider: [name], [place] Other Clinician: Referring Provider: Treating Provider/Extender: Dayna Ramus, [place] Weeks in Treatment: 18 Subjective Chief Complaint Information obtained from Patient Left LE Ulcers History of Present Illness (HPI) 09/30/2019 upon evaluation today patient presents for initial inspection here in our  clinic concerning issues that she has been experiencing with a wound on her left anterior lower leg. She unfortunately had a significant laceration which required 19 sutures and she shows me the pictures both before as well as after sutured in a very good job was done in this regard. Unfortunately the skin did not take and necrosis. Subsequently this wound open and she has been having more significant issues with that since. Fortunately there is no evidence of active infection at this time which is good news. No fever chills noted. She has a history of chronic venous stasis but is no longer able to wear any compression in fact she cannot even wear regular socks due to her neuropathy and the pain that she experiences in her feet if she does. For that reason she tries to elevate her legs as much as she can at nighttime but again she really cannot wear any type of compression. With regard to antibiotic it does not appear that she has been on anything recently. 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 Fortunately there is no evidence of local or systemic infection. No fevers, chills, nausea, vomiting, or diarrhea. 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 10/07/2019 on evaluation today patient presents for follow-up concerning her wound on the left lower extremity. She has been tolerating the dressing changes without complication. With that being said she just got the Santyl 2 days ago. Obviously it has not had enough time to really do what we need to do she would like to obviously give this some time before proceeding with more aggressive debridement or otherwise. 10/21/2019 upon evaluation today patient appears to be doing better with regard to the overall appearance  of her wound is not quite as dry I do believe the Santyl has helped  her improve to some degree here. Fortunately there is no signs of active infection at this time. I think we may be able to clean off the wound with a little bit of additional debridement today and likely we can transition away from the Santyl to some kind of collagen type dressing. 10/28/2019 upon evaluation today patient actually appears to be doing fairly well with regard to her wound today. She has been tolerating the dressing changes we did switch to collagen and fortunately that seems to be doing well for her at this point. There does not seem to be any signs of active infection I do feel like the surface of the wound is looking better at this time as well. 11/04/2019 upon evaluation today patient appears to be doing well with regard to her wound. She is making some progress here and the wound does appear to be measuring smaller which is good news. Overall this is good to be somewhat slow but nonetheless I am encouraged by the fact that the collagen does seem to have helped her. 11/11/2019 on evaluation today patient's wound is showing some signs of improvement. The good news is her overall trend does seem to be towards getting better compared to where she was previous. The smaller wounds along the side actually might even be healed but we can watch this 1 more week before I heal this out. Overall I feel like there is improvement here. She is still using the silver collagen. 11/18/2019 upon evaluation today patient appears to be doing about the same with regard to her wound. This is not significantly smaller there still appears to be a lot of inflammation. For that reason I do want to see about actually applying triamcinolone to the actual wound bed to see if this can be of benefit as well. I thought about this for couple weeks but I think it may be a good thing to proceed with at this point. 11/25/2019 on evaluation today patient  appears to be doing well with regard to her lower extremity ulcer. She has been tolerating the dressing changes without complication. Fortunately there is no signs of active infection at this time. No fevers, chills, nausea, vomiting, or diarrhea. 12/02/2019 on evaluation today patient appears to be doing okay with regard to her ulcer on the leg. Fortunately there is no signs of active infection at this time. No fever chills noted she has been tolerating the dressing changes without complication. With that being said overall I am pleased with the fact that there is no signs of infection but at the same time I really feel like she needs more to progress moving week to week and right now were really not seeing that. We have put in for approval for Apligraf which I think could help to move things along. With that being said we have not heard anything regarding approval at this point. 12/10/2019; patient comes in on my day although have not seen her previously. She has what was a traumatic wound on the left anterior mid tibia. Initially sutured however the area never really healed according to the patient. She has been using Santyl now using Prisma. There is exposed tendon. We have made application for Apligraf apparently this is going through [employer] still has not been approved. She arrives in clinic with erythema around the wound and 4 small circular papules superior to the wound. These are nontender and she is not complaining of any more pain. The exact cause of this is not  really clear. The patient has idiopathic peripheral neuropathy which for which she has been extensively evaluated in the past she is not a diabetic she does not have any rheumatologic problems she is aware of. She cannot wear compression because it aggravates her neuropathy 4/15; traumatic wound on the left anterior mid tibia. She arrives today with extensive erythema spreading from the wound medially towards her heel. This  is tender swollen and warm compatible with cellulitis. We are using silver collagen to the wound 4/20; 5-day follow-up. Traumatic wound on the left anterior mid tibia. This is in follow-up for the cellulitis she had on her visit 5 days ago. I gave her empiric cephalexin I did not culture this. She arrives today with the erythema that we marked much improved. She has not been systemically unwell Upon evaluation today patient's wound unfortunately is not doing very well this in fact is somewhat deeper than even last week4/28/2021 which is unfortunate. I am very concerned about the fact that to be honest that the patient does not seem to be doing as good as we would have expected in the past 2 weeks since I last saw her. Fortunately there is no evidence of active infection systemically which is good news. No fevers, chills, nausea, vomiting, or diarrhea. She has been on Keflex. 01/06/2020 upon evaluation today patient unfortunately appears to still be doing somewhat poorly overall in regard to her ulcer. There is definite signs of infection. She has been on Keflex in April but unfortunately this just does not seem to be doing as well as we would like. I do believe that we will get a need to place her on Augmentin in order to try to get this better. She tested positive for Proteus and E. coli on the culture and is allergic to doxycycline. The patient's wound currently is not looking healthy at all to me. 01/27/2020 upon evaluation today patient appears to be doing maybe slightly better with regard to the wound bed at this point. Her infection is definitely improved which is great news. As far as the Apligraf I do not believe the wound surface is quite clean enough yet in order for [place] to apply the Apligraf. Another potential option would be for a snap VAC to be used under the Curlex and Coban wrap working to try the wrap today. If we can get rid of some of the fluid I think this will help her with healing  also think the snap VAC could be beneficial in this regard. Nonetheless again I am not given up on the Apligraf is a possibility I just do not want a waste the applications by putting it on before the wound surface is ready. Potentially the snap VAC can get this wound to the point where it is ready much more quickly. 02/03/2020 upon evaluation today patient appears to be doing excellent with regard to her leg ulcer compared to where things have been. The overall quality of the wound bed is significantly improved and very pleased in this regard. We did get approval for the snap VAC which I am wanting to go ahead and see about initiating at this point. We also have approval for the Apligraf when and if the time is appropriate for this. With that being said I really think using the snap VAC to try to get this to fill-in would be appropriate to begin with. The patient is in agreement with that plan. I do believe the compression did help her and she states  that that was uncomfortable she was able to manage with that for now. Objective Constitutional Well-nourished and well-hydrated in no acute distress. Vitals Time Taken: 8:04 AM, Height: 66 in, Weight: 156 lbs, BMI: 25.2, Temperature: 98.3 F, Pulse: 97 bpm, Respiratory Rate: 18 breaths/min, Blood Pressure: 131/71 mmHg. Respiratory normal breathing without difficulty. Psychiatric this patient is able to make decisions and demonstrates good insight into disease process. Alert and Oriented x 3. pleasant and cooperative. General Notes: Upon inspection patient's wound bed actually showed signs of better granulation there was some need for sharp debridement at all 3 wound locations today which the patient tolerated without complication. Post debridement wound beds appear to be doing significantly better which is great news. Integumentary (Hair, Skin) Wound #2 status is Open. Original cause of wound was Trauma. The wound is located on the Left,Anterior  Lower Leg. The wound measures 5.1cm length x 4cm width x 0.4cm depth; 16.022cm^2 area and 6.409cm^3 volume. There is muscle, tendon, and Fat Layer (Subcutaneous Tissue) Exposed exposed. There is no tunneling or undermining noted. There is a medium amount of serous drainage noted. The wound margin is epibole. There is small (1-33%) red granulation within the wound bed. There is a large (67-100%) amount of necrotic tissue within the wound bed including Adherent Slough. Wound #4 status is Open. Original cause of wound was Gradually Appeared. The wound is located on the Left,Proximal,Anterior Lower Leg. The wound measures 0.6cm length x 0.4cm width x 0.2cm depth; 0.188cm^2 area and 0.038cm^3 volume. There is Fat Layer (Subcutaneous Tissue) Exposed exposed. There is no tunneling or undermining noted. There is a medium amount of serous drainage noted. The wound margin is distinct with the outline attached to the wound base. There is no granulation within the wound bed. There is a large (67-100%) amount of necrotic tissue within the wound bed including Adherent Slough. Wound #5 status is Open. Original cause of wound was Bump. The wound is located on the Left,Medial Malleolus. The wound measures 0.3cm length x 0.3cm width x 0.1cm depth; 0.071cm^2 area and 0.007cm^3 volume. There is Fat Layer (Subcutaneous Tissue) Exposed exposed. There is no tunneling or undermining noted. There is a small amount of purulent drainage noted. The wound margin is flat and intact. There is small (1-33%) red granulation within the wound bed. There is a large (67-100%) amount of necrotic tissue within the wound bed including Adherent Slough. 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 There was a Excisional Skin/Subcutaneous Tissue Debridement with a total area of 20.4 sq cm performed by Lenda KelpStone III, Loralye Loberg, PA. With the following instrument(s): Curette to remove Viable and Non-Viable tissue/material. Material removed includes Subcutaneous Tissue and Slough and after achieving pain control using Other (benzocaine 20 % spray). No specimens were taken. A time out was conducted at 08:25, prior to the start of the procedure. A Minimum amount of bleeding was controlled with Pressure. The procedure was tolerated well with a pain level of 4 throughout and a pain level of 2 following  the procedure. Post Debridement Measurements: 5.1cm length x 4cm width x 0.4cm depth; 6.409cm^3 volume. Character of Wound/Ulcer Post Debridement is improved. Severity of Tissue Post Debridement is: Fat layer exposed. Post procedure Diagnosis Wound #2: Same as Pre-Procedure Wound #5 Pre-procedure diagnosis of Wound #5 is a Venous Leg Ulcer located on the Left,Medial Malleolus .Severity of Tissue Pre Debridement is: Fat layer exposed. There was a Excisional Skin/Subcutaneous Tissue Debridement with a total area of 0.09 sq cm performed by Lenda Kelp, PA. With the following instrument(s): Curette to remove Viable and Non-Viable tissue/material. Material removed includes Subcutaneous Tissue, Slough, and Fibrin/Exudate after achieving pain control using Other (benzocaine 20 % spray). No specimens were taken. A time out was conducted at 08:25, prior to the start of the procedure. There was no bleeding. The procedure was tolerated well with a  pain level of 4 throughout and a pain level of 2 following the procedure. Post Debridement Measurements: 0.3cm length x 0.3cm width x 0.1cm depth; 0.007cm^3 volume. Character of Wound/Ulcer Post Debridement is improved. Severity of Tissue Post Debridement is: Fat layer exposed. Post procedure Diagnosis Wound #5: Same as Pre-Procedure Plan Follow-up Appointments: Return Appointment in 1 week. Dressing Change Frequency: Wound #4 Left,Proximal,Anterior Lower Leg: Do not change entire dressing for one week. - unless unable to tolerate compression wrap, if not change daily with santyl Wound #5 Left,Medial Malleolus: Do not change entire dressing for one week. - unless unable to tolerate compression wrap, if not change daily with santyl Wound #2 Left,Anterior Lower Leg: Do not change entire dressing for one week. - if wrap removed leave SNAP VAC in place Skin Barriers/Peri-Wound Care: Moisturizing lotion Wound Cleansing: Wound #2 Left,Anterior Lower Leg: May shower with protection. May shower and wash wound with soap and water. - scrub gently with gauze if dressing changed Primary Wound Dressing: Wound #4 Left,Proximal,Anterior Lower Leg: Iodoflex Wound #5 Left,Medial Malleolus: Iodoflex Secondary Dressing: Wound #5 Left,Medial Malleolus: Dry Gauze Wound #4 Left,Proximal,Anterior Lower Leg: Dry Gauze Negative Presssure Wound Therapy: Wound #2 Left,Anterior Lower Leg: SNAP Vac to wound continuously at 171mm/hg pressure Edema Control: Kerlix and Coban - Left Lower Extremity Avoid standing for long periods of time Elevate legs to the level of the heart or above for 30 minutes daily and/or when sitting, a frequency of: - throughout the day Exercise regularly 1. My suggestion currently is good be that we go ahead and continue with the Iodoflex for the small wounds at this point. The patient is in agreement with that plan. 2. Subsequently I am also going to recommend that we go ahead and  initiate treatment with the wound/not VAC for the patient's largest wound on the left lower extremity. I think this is absolutely appropriate and will likely benefit her as well. 3. I would also recommend at this point that we continue with the Curlex and Coban wrap which does seem to have helped with her edema control I think that has been much better based on what I am seeing today. We will see patient back for reevaluation in 1 week here in the clinic. If anything worsens or changes patient will contact our office for additional recommendations. Electronic Signature(s) Signed: 02/03/2020 8:39:15 AM By: Lenda Kelp PA-C Entered By: Lenda Kelp on 02/03/2020 08:39:15 -------------------------------------------------------------------------------- SuperBill Details Patient Name: Date of Service: [name] 02/03/2020 Medical Record Number: [id number] Patient Account Number: [id number] Date of Birth/Sex: Treating RN: 26-Sep-1960 (59 y.o. [name] Primary Care Provider: Docia Chuck, [place] Other Clinician:  Referring Provider: Treating Provider/Extender: Richardo Priest, [place] Weeks in Treatment: 18 Diagnosis Coding ICD-10 Codes Code Description I87.2 Venous insufficiency (chronic) (peripheral) L97.822 Non-pressure chronic ulcer of other part of left lower leg with fat layer exposed L03.116 Cellulitis of left lower limb L97.828 Non-pressure chronic ulcer of other part of left lower leg with other specified severity 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 I73.00 Raynaud's syndrome without gangrene 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, unspecified Facility Procedures CPT4 Code: [id number] Description: 11042 - DEB SUBQ TISSUE 20 SQ CM/< ICD-10 Diagnosis Description L97.822 Non-pressure chronic ulcer of other part of left lower leg with fat layer exposed L97.828 Non-pressure chronic ulcer of other part of left lower leg with other specified  s Modifier:  everity Quantity: 1 CPT4 Code: [id number] Description: 11045 - DEB SUBQ TISS EA ADDL 20CM ICD-10 Diagnosis Description L97.822 Non-pressure chronic ulcer of other part of left lower leg with fat layer exposed L97.828 Non-pressure chronic ulcer of other part of left lower leg with other specified  s Modifier: everity Quantity: 1 CPT4 Code: [id number] Description: 43154 - WOUND VAC-50 SQ CM OR LESS ICD-10 Diagnosis Description L97.822 Non-pressure chronic ulcer of other part of left lower leg with fat layer exposed Modifier: Quantity: 1 Physician Procedures : CPT4 Code Description Modifier [id number] 11042 - WC PHYS SUBQ TISS 20 SQ CM ICD-10 Diagnosis Description L97.822 Non-pressure chronic ulcer of other part of left lower leg with fat layer exposed L97.828 Non-pressure chronic ulcer of other part of left  lower leg with other specified severity Quantity: 1 : [id number] 11045 - WC PHYS SUBQ TISS EA ADDL 20 CM ICD-10 Diagnosis Description L97.822 Non-pressure chronic ulcer of other part of left lower leg with fat layer exposed L97.828 Non-pressure chronic ulcer of other part of left lower leg with other  specified severity Quantity: 1 Electronic Signature(s) Signed: 02/03/2020 8:39:57 AM By: Lenda Kelp PA-C Entered By: Lenda Kelp on 02/03/2020 08:39:56

## 2020-02-04 NOTE — Progress Notes (Signed)
[name], [name] ([id number]) Visit Report for 01/27/2020 Arrival Information Details Patient Name: Date of Service: [name], [name] 01/27/2020 8:00 A M Medical Record Number: [id number] Patient Account Number: [id number] Date of Birth/Sex: Treating RN: 1961-06-19 (59 y.o. [name] Primary Care [name]: [name], [place] Other Clinician: Referring Hannahmarie Asberry: Treating Tammie Ellsworth/Extender: Dayna Ramus, [place] Weeks in Treatment: 31 Visit Information History Since Last Visit Added or deleted any medications: No Patient Arrived: Ambulatory Any new allergies or adverse reactions: No Arrival Time: 08:00 Had a fall or experienced change in No Accompanied By: self activities of daily living that may affect Transfer Assistance: None risk of falls: Patient Identification Verified: Yes Signs or symptoms of abuse/neglect since last visito No Secondary Verification Process Completed: Yes Hospitalized since last visit: No Patient Requires Transmission-Based Precautions: No Implantable device outside of the clinic excluding No Patient Has Alerts: Yes cellular tissue based products placed in the center since last visit: Has Dressing in Place as Prescribed: Yes Pain Present Now: No Electronic Signature(s) Signed: 02/04/2020 9:13:31 AM By: Sandre Kitty Entered By: Sandre Kitty on 01/27/2020 08:01:29 -------------------------------------------------------------------------------- Encounter Discharge Information Details Patient Name: Date of Service: [name] 01/27/2020 8:00 A M Medical Record Number: [id number] Patient Account Number: [id number] Date of Birth/Sex: Treating RN: 09/14/1960 (59 y.o. [name] Primary Care Patriece Archbold: [name], [place] Other Clinician: Referring Bereket Gernert: Treating Shadoe Bethel/Extender: Dayna Ramus, [place] Weeks in Treatment: 17 Encounter Discharge Information Items Post Procedure Vitals Discharge Condition:  Stable Temperature (F): 98.1 Ambulatory Status: Ambulatory Pulse (bpm): 88 Discharge Destination: Home Respiratory Rate (breaths/min): 20 Transportation: Private Auto Blood Pressure (mmHg): 114/73 Accompanied By: self Schedule Follow-up Appointment: Yes Clinical Summary of Care: Patient Declined Electronic Signature(s) Signed: 01/29/2020 9:26:18 AM By: Carlene Coria RN Entered By: Carlene Coria on 01/27/2020 09:02:14 -------------------------------------------------------------------------------- Lower Extremity Assessment Details Patient Name: Date of Service: [name], [name] 01/27/2020 8:00 A M Medical Record Number: [id number] Patient Account Number: [id number] Date of Birth/Sex: Treating RN: Apr 22, 1961 (59 y.o. [name] Primary Care Marliyah Reid: [name], [place] Other Clinician: Referring Garron Eline: Treating Brentyn Seehafer/Extender: Dayna Ramus, [place] Weeks in Treatment: 17 Edema Assessment Assessed: [Left: Yes] [Right: No] Edema: [Left: Ye] [Right: s] Calf Left: Right: Point of Measurement: 29 cm From Medial Instep 37.5 cm cm Ankle Left: Right: Point of Measurement: 8 cm From Medial Instep 25 cm cm Vascular Assessment Pulses: Dorsalis Pedis Palpable: [Left:Yes] Electronic Signature(s) Signed: 01/27/2020 2:12:16 PM By: Baruch Gouty RN, BSN Signed: 02/04/2020 9:13:31 AM By: Sandre Kitty Entered By: Sandre Kitty on 01/27/2020 08:03:35 -------------------------------------------------------------------------------- Multi-Disciplinary Care Plan Details Patient Name: Date of Service: Cheyenne Mccallum B. 01/27/2020 8:00 A M Medical Record Number: [id number] Patient Account Number: [id number] Date of Birth/Sex: Treating RN: 03/15/1961 (59 y.o. [name] Primary Care Sadie Pickar: [name], [place] Other Clinician: Referring Javion Holmer: Treating Shean Gerding/Extender: Dayna Ramus, [place] Weeks in Treatment: 17 Active Inactive Venous Leg  Ulcer Nursing Diagnoses: Knowledge deficit related to disease process and management Potential for venous Insuffiency (use before diagnosis confirmed) Goals: Patient will maintain optimal edema control Date Initiated: 09/30/2019 Target Resolution Date: 02/26/2020 Goal Status: Active Patient/caregiver will verbalize understanding of disease process and disease management Date Initiated: 09/30/2019 Date Inactivated: 11/25/2019 Target Resolution Date: 11/25/2019 Goal Status: Met Interventions: Assess peripheral edema status every visit. Compression as ordered Provide education on venous insufficiency Treatment Activities: Therapeutic compression applied : 09/30/2019 Notes: Wound/Skin Impairment Nursing Diagnoses: Impaired tissue integrity Knowledge deficit related to ulceration/compromised skin integrity Goals: Patient/caregiver will verbalize understanding of  skin care regimen Date Initiated: 09/30/2019 Target Resolution Date: 02/26/2020 Goal Status: Active Ulcer/skin breakdown will have a volume reduction of 30% by week 4 Date Initiated: 09/30/2019 Date Inactivated: 10/28/2019 Target Resolution Date: 10/28/2019 Goal Status: Unmet Unmet Reason: other comorbidities Interventions: Assess patient/caregiver ability to obtain necessary supplies Assess patient/caregiver ability to perform ulcer/skin care regimen upon admission and as needed Assess ulceration(s) every visit Provide education on ulcer and skin care Treatment Activities: Skin care regimen initiated : 09/30/2019 Topical wound management initiated : 09/30/2019 Notes: Electronic Signature(s) Signed: 01/27/2020 2:12:16 PM By: Baruch Gouty RN, BSN Entered By: Baruch Gouty on 01/27/2020 08:29:00 -------------------------------------------------------------------------------- Pain Assessment Details Patient Name: Date of Service: [name] 01/27/2020 8:00 A M Medical Record Number: [id number] Patient Account Number:  [id number] Date of Birth/Sex: Treating RN: 05/26/1961 (59 y.o. [name] Primary Care Sherin Murdoch: [name], [place] Other Clinician: Referring Anuj Summons: Treating Jerron Niblack/Extender: Dayna Ramus, [place] Weeks in Treatment: 17 Active Problems Location of Pain Severity and Description of Pain Patient Has Paino No Site Locations Pain Management and Medication Current Pain Management: Electronic Signature(s) Signed: 01/27/2020 2:12:16 PM By: Baruch Gouty RN, BSN Signed: 02/04/2020 9:13:31 AM By: Sandre Kitty Entered By: Sandre Kitty on 01/27/2020 08:02:00 -------------------------------------------------------------------------------- Patient/Caregiver Education Details Patient Name: Date of Service: [name], Cheyenne B. 5/26/2021andnbsp8:00 [place] Record Number: [id number] Patient Account Number: [id number] Date of Birth/Gender: Treating RN: 1960/10/08 (59 y.o. [name] Primary Care Physician: [name], [place] Other Clinician: Referring Physician: Treating Physician/Extender: Dayna Ramus, [place] Weeks in Treatment: 17 Education Assessment Education Provided To: Patient Education Topics Provided Venous: Methods: Explain/Verbal Responses: Reinforcements needed, State content correctly Wound/Skin Impairment: Methods: Explain/Verbal Responses: Reinforcements needed, State content correctly Electronic Signature(s) Signed: 01/27/2020 2:12:16 PM By: Baruch Gouty RN, BSN Entered By: Baruch Gouty on 01/27/2020 08:29:20 -------------------------------------------------------------------------------- Wound Assessment Details Patient Name: Date of Service: [name] 01/27/2020 8:00 A M Medical Record Number: [id number] Patient Account Number: [id number] Date of Birth/Sex: Treating RN: 09/21/1960 (59 y.o. [name] Primary Care Muneeb Veras: [name], [place] Other Clinician: Referring Natalin Bible: Treating Londynn Sonoda/Extender:  Dayna Ramus, [place] Weeks in Treatment: 17 Wound Status Wound Number: 2 Primary Etiology: Venous Leg Ulcer Wound Location: Left, Anterior Lower Leg Wound Status: Open Wounding Event: Trauma Comorbid History: Lymphedema, Raynauds, Neuropathy Date Acquired: 08/06/2019 Weeks Of Treatment: 17 Clustered Wound: No Photos Photo Uploaded By: Mikeal Hawthorne on 01/28/2020 14:56:09 Wound Measurements Length: (cm) 5.9 Width: (cm) 4 Depth: (cm) 0.4 Area: (cm) 18.535 Volume: (cm) 7.414 % Reduction in Area: -1.6% % Reduction in Volume: -103.3% Epithelialization: None Tunneling: No Undermining: No Wound Description Classification: Full Thickness With Exposed Support Structures Wound Margin: Epibole Exudate Amount: Medium Exudate Type: Serosanguineous Exudate Color: red, brown Foul Odor After Cleansing: No Slough/Fibrino Yes Wound Bed Granulation Amount: Small (1-33%) Exposed Structure Granulation Quality: Red Fascia Exposed: No Necrotic Amount: Large (67-100%) Fat Layer (Subcutaneous Tissue) Exposed: Yes Necrotic Quality: Adherent Slough Tendon Exposed: Yes Muscle Exposed: Yes Necrosis of Muscle: No Joint Exposed: No Bone Exposed: No Electronic Signature(s) Signed: 01/27/2020 1:35:02 PM By: Deon Pilling Entered By: Deon Pilling on 01/27/2020 08:04:35 -------------------------------------------------------------------------------- Wound Assessment Details Patient Name: Date of Service: [name] 01/27/2020 8:00 A M Medical Record Number: [id number] Patient Account Number: [id number] Date of Birth/Sex: Treating RN: 11-05-1960 (58 y.o. [name] Primary Care Creedence Heiss: [name], [place] Other Clinician: Referring Roschelle Calandra: Treating Natisha Trzcinski/Extender: Dayna Ramus, [place] Weeks in Treatment: 17 Wound Status Wound Number: 4 Primary Etiology: Venous Leg Ulcer  Wound Location: Left, Proximal, Anterior Lower Leg Wound Status: Open Wounding  Event: Gradually Appeared Comorbid History: Lymphedema, Raynauds, Neuropathy Date Acquired: 01/20/2020 Weeks Of Treatment: 0 Clustered Wound: No Photos Photo Uploaded By: Mikeal Hawthorne on 01/28/2020 14:56:10 Wound Measurements Length: (cm) 0.6 Width: (cm) 0.5 Depth: (cm) 0.3 Area: (cm) 0.236 Volume: (cm) 0.071 % Reduction in Area: 0% % Reduction in Volume: 0% Epithelialization: None Tunneling: No Undermining: No Wound Description Classification: Full Thickness Without Exposed Support Struct Wound Margin: Distinct, outline attached Exudate Amount: Medium Exudate Type: Serosanguineous Exudate Color: red, brown ures Foul Odor After Cleansing: No Slough/Fibrino Yes Wound Bed Necrotic Amount: Large (67-100%) Exposed Structure Necrotic Quality: Adherent Slough Fascia Exposed: No Fat Layer (Subcutaneous Tissue) Exposed: Yes Tendon Exposed: No Muscle Exposed: No Joint Exposed: No Bone Exposed: No Electronic Signature(s) Signed: 01/27/2020 1:35:02 PM By: Deon Pilling Entered By: Deon Pilling on 01/27/2020 08:04:59 -------------------------------------------------------------------------------- Vitals Details Patient Name: Date of Service: Cheyenne Mccallum B. 01/27/2020 8:00 A M Medical Record Number: [id number] Patient Account Number: [id number] Date of Birth/Sex: Treating RN: 1961-04-20 (59 y.o. [name] Primary Care Elward Nocera: [name], [place] Other Clinician: Referring Najat Olazabal: Treating Genae Strine/Extender: Dayna Ramus, [place] Weeks in Treatment: 17 Vital Signs Time Taken: 08:01 Temperature (F): 98.1 Height (in): 66 Pulse (bpm): 88 Weight (lbs): 156 Respiratory Rate (breaths/min): 20 Body Mass Index (BMI): 25.2 Blood Pressure (mmHg): 114/73 Reference Range: 80 - 120 mg / dl Electronic Signature(s) Signed: 02/04/2020 9:13:31 AM By: Sandre Kitty Entered By: Sandre Kitty on 01/27/2020 08:01:53

## 2020-02-10 ENCOUNTER — Encounter (HOSPITAL_BASED_OUTPATIENT_CLINIC_OR_DEPARTMENT_OTHER): Payer: Worker's Compensation | Admitting: Physician Assistant

## 2020-02-10 DIAGNOSIS — L97828 Non-pressure chronic ulcer of other part of left lower leg with other specified severity: Secondary | ICD-10-CM | POA: Diagnosis not present

## 2020-02-10 NOTE — Progress Notes (Addendum)
[name], [name] ([id number]) Visit Report for 02/10/2020 Chief Complaint Document Details Patient Name: Date of Service: [name], [name] 02/10/2020 8:00 A M Medical Record Number: [id number] Patient Account Number: [id number] Date of Birth/Sex: Treating RN: 04/03/1961 (59 y.o. [name] Standard Primary Care Provider: Docia Chuck, [place] Other Clinician: Referring Provider: Treating Provider/Extender: Richardo Priest, [place] Weeks in Treatment: 78 Information Obtained from: Patient Chief Complaint Left LE Ulcers Electronic Signature(s) Signed: 02/10/2020 8:12:22 AM By: Lenda Kelp PA-C Entered By: Lenda Kelp on 02/10/2020 08:65:78 -------------------------------------------------------------------------------- HPI Details Patient Name: Date of Service: Cheyenne Aran B. 02/10/2020 8:00 A M Medical Record Number: [id number] Patient Account Number: [id number] Date of Birth/Sex: Treating RN: 03/04/1961 (59 y.o. [name] Standard Primary Care Provider: Docia Chuck, [place] Other Clinician: Referring Provider: Treating Provider/Extender: Richardo Priest, [place] Weeks in Treatment: 74 History of Present Illness HPI Description: 09/30/2019 upon evaluation today patient presents for initial inspection here in our clinic concerning issues that she has been experiencing with a wound on her left anterior lower leg. She unfortunately had a significant laceration which required 19 sutures and she shows me the pictures both before as well as after sutured in a very good job was done in this regard. Unfortunately the skin did not take and necrosis. Subsequently this wound open and she has been having more significant issues with that since. Fortunately there is no evidence of active infection at this time which is good news. No fever chills noted. She has a history of chronic venous stasis but is no longer able to wear any compression in fact she cannot even wear regular socks due to  her neuropathy and the pain that she experiences in her feet if she does. For that reason she tries to elevate her legs as much as she can at nighttime but again she really cannot wear any type of compression. With regard to antibiotic it does not appear that she has been on anything recently. 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 Fortunately there is no evidence of local or systemic infection. No fevers, chills, nausea, vomiting, or diarrhea. 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 10/07/2019 on evaluation today patient presents for follow-up concerning her wound on the left lower extremity. She has been tolerating the dressing changes without complication. With that being said she just got the Santyl 2 days ago. Obviously it has not had enough time to really do what we need to do she would like to obviously give this some time before proceeding with more aggressive debridement or otherwise. 10/21/2019 upon evaluation today patient appears to be doing better with regard to the overall appearance of her wound is not quite as dry I do believe the Santyl has helped her improve to some degree here. Fortunately there is no signs of active infection at this time. I think we may be able to clean off the wound with a little bit of additional debridement today and likely we can transition away from the Santyl to some kind of collagen type dressing. 10/28/2019 upon evaluation today patient actually appears to be doing fairly well with regard to her wound today. She has been tolerating the dressing changes  we did switch to collagen and fortunately that seems to be doing well for her at this point. There does not seem to be any signs of active infection I do feel like the  surface of the wound is looking better at this time as well. 11/04/2019 upon evaluation today patient appears to be doing well with regard to her wound. She is making some progress here and the wound does appear to be measuring smaller which is good news. Overall this is good to be somewhat slow but nonetheless I am encouraged by the fact that the collagen does seem to have helped her. 11/11/2019 on evaluation today patient's wound is showing some signs of improvement. The good news is her overall trend does seem to be towards getting better compared to where she was previous. The smaller wounds along the side actually might even be healed but we can watch this 1 more week before I heal this out. Overall I feel like there is improvement here. She is still using the silver collagen. 11/18/2019 upon evaluation today patient appears to be doing about the same with regard to her wound. This is not significantly smaller there still appears to be a lot of inflammation. For that reason I do want to see about actually applying triamcinolone to the actual wound bed to see if this can be of benefit as well. I thought about this for couple weeks but I think it may be a good thing to proceed with at this point. 11/25/2019 on evaluation today patient appears to be doing well with regard to her lower extremity ulcer. She has been tolerating the dressing changes without complication. Fortunately there is no signs of active infection at this time. No fevers, chills, nausea, vomiting, or diarrhea. 12/02/2019 on evaluation today patient appears to be doing okay with regard to her ulcer on the leg. Fortunately there is no signs of active infection at this time. No fever chills noted she has been tolerating the dressing changes without complication. With that being said overall I am pleased with the fact that there is no signs of infection but at the same time I really feel like she needs more to progress moving week to week  and right now were really not seeing that. We have put in for approval for Apligraf which I think could help to move things along. With that being said we have not heard anything regarding approval at this point. 12/10/2019; patient comes in on my day although have not seen her previously. She has what was a traumatic wound on the left anterior mid tibia. Initially sutured however the area never really healed according to the patient. She has been using Santyl now using Prisma. There is exposed tendon. We have made application for Apligraf apparently this is going through [employer] still has not been approved. She arrives in clinic with erythema around the wound and 4 small circular papules superior to the wound. These are nontender and she is not complaining of any more pain. The exact cause of this is not really clear. The patient has idiopathic peripheral neuropathy which for which she has been extensively evaluated in the past she is not a diabetic she does not have any rheumatologic problems she is aware of. She cannot wear compression because it aggravates her neuropathy 4/15; traumatic wound on the left anterior mid tibia. She arrives today with extensive erythema spreading from the wound medially towards her heel. This is tender swollen and warm compatible with cellulitis.  We are using silver collagen to the wound 4/20; 5-day follow-up. Traumatic wound on the left anterior mid tibia. This is in follow-up for the cellulitis she had on her visit 5 days ago. I gave her empiric cephalexin I did not culture this. She arrives today with the erythema that we marked much improved. She has not been systemically unwell Upon evaluation today patient's wound unfortunately is not doing very well this in fact is somewhat deeper than even last week4/28/2021 which is unfortunate. I am very concerned about the fact that to be honest that the patient does not seem to be doing as good as we would have  expected in the past 2 weeks since I last saw her. Fortunately there is no evidence of active infection systemically which is good news. No fevers, chills, nausea, vomiting, or diarrhea. She has been on Keflex. 01/06/2020 upon evaluation today patient unfortunately appears to still be doing somewhat poorly overall in regard to her ulcer. There is definite signs of infection. She has been on Keflex in April but unfortunately this just does not seem to be doing as well as we would like. I do believe that we will get a need to place her on Augmentin in order to try to get this better. She tested positive for Proteus and E. coli on the culture and is allergic to doxycycline. The patient's wound currently is not looking healthy at all to me. 01/27/2020 upon evaluation today patient appears to be doing maybe slightly better with regard to the wound bed at this point. Her infection is definitely improved which is great news. As far as the Apligraf I do not believe the wound surface is quite clean enough yet in order for [place] to apply the Apligraf. Another potential option would be for a snap VAC to be used under the Curlex and Coban wrap working to try the wrap today. If we can get rid of some of the fluid I think this will help her with healing also think the snap VAC could be beneficial in this regard. Nonetheless again I am not given up on the Apligraf is a possibility I just do not want a waste the applications by putting it on before the wound surface is ready. Potentially the snap VAC can get this wound to the point where it is ready much more quickly. 02/03/2020 upon evaluation today patient appears to be doing excellent with regard to her leg ulcer compared to where things have been. The overall quality of the wound bed is significantly improved and very pleased in this regard. We did get approval for the snap VAC which I am wanting to go ahead and see about initiating at this point. We also have approval  for the Apligraf when and if the time is appropriate for this. With that being said I really think using the snap VAC to try to get this to fill-in would be appropriate to begin with. The patient is in agreement with that plan. I do believe the compression did help her and she states that that was uncomfortable she was able to manage with that for now. 02/10/2020 upon evaluation today patient appears to be doing better with regard to her original and largest wound. 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  I explained that pyoderma is incompletely understood and obviously very difficult sometimes to treat but often steroid therapy is the mainstay of treatment. I think topical steroid treatment is appropriate at this point. She does have mometasone which has been prescribed previously by her primary care provider and she has that with her so we can use that on some of the satellite areas not on the main wound. The patient is in agreement with that plan. Electronic Signature(s) Signed: 02/10/2020 8:50:17 AM By: Lenda KelpStone III, Jaylin Benzel PA-C Entered By: Lenda KelpStone III, Yamato Kopf on 02/10/2020 08:50:17 -------------------------------------------------------------------------------- Physical Exam Details Patient Name: Date of Service: Cheyenne LemonsSMITHEY, Cheyenne B. 02/10/2020 8:00 A M Medical Record Number: [id number] Patient Account Number: [id number] Date of Birth/Sex: Treating RN: 02-Aug-1961 (59 y.o. [name] StandardF) Boehlein, Linda Primary Care Provider: Docia ChuckKoirala, [place] Other Clinician: Referring Provider: Treating Provider/Extender: Richardo PriestStone III, Mileydi Milsap [place], [place] Weeks in Treatment: 5719 Constitutional Well-nourished and well-hydrated in no acute distress. Respiratory normal breathing without difficulty. Psychiatric this patient is able to make  decisions and demonstrates good insight into disease process. Alert and Oriented x 3. pleasant and cooperative. Notes Patient's wound bed actually showed signs of good granulation at this time there does appear to be additional granulation which is also good news and fortunately there is no signs of active infection at this time. No fevers, chills, nausea, vomiting, or diarrhea. Electronic Signature(s) Signed: 02/10/2020 8:50:33 AM By: Lenda KelpStone III, Anquinette Pierro PA-C Entered By: Lenda KelpStone III, Zsazsa Bahena on 02/10/2020 08:50:32 -------------------------------------------------------------------------------- Physician Orders Details Patient Name: Date of Service: Cheyenne AranSMITHEY, Cheyenne B. 02/10/2020 8:00 A M Medical Record Number: [id number] Patient Account Number: [id number] Date of Birth/Sex: Treating RN: 02-Aug-1961 (59 y.o. 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 Dressing Change Frequency Wound #2 Left,Anterior Lower Leg Do not change entire dressing for one week. - remove dressing if SNAP canister fills Wound #4 Left,Proximal,Anterior Lower Leg Do not change entire dressing for one week. - unless unable to tolerate compression wrap, if not change every other day with  iodoflex Wound #5 Left,Medial Malleolus Do not change entire dressing for one week. - unless unable to tolerate compression wrap, if not change every other day with iodoflex Skin Barriers/Peri-Wound Care Moisturizing lotion Other: - mometasone cream to irratated areas on leg with dressing changes Wound Cleansing Wound #2 Left,Anterior Lower Leg May shower with protection. May shower and wash wound with soap and water. - scrub gently with gauze if dressing changed Primary Wound Dressing Wound #4 Left,Proximal,Anterior Lower Leg Iodoflex - thin layer of mometasone cream to wound bed under iodoflex Wound #5 Left,Medial Malleolus Iodoflex - thin layer of mometasone cream to wound bed under iodoflex Secondary Dressing Wound #4 Left,Proximal,Anterior Lower Leg Dry Gauze Wound #5 Left,Medial Malleolus Dry Gauze Negative Presssure Wound Therapy Wound #2 Left,Anterior Lower Leg SNAP Vac to wound continuously at 12125mm/hg pressure - cut drape size down to minimum to hold in place, skin prep under drape Edema  Control Kerlix and Coban - Left Lower Extremity Avoid standing for long periods of time Elevate legs to the level of the heart or above for 30 minutes daily and/or when sitting, a frequency of: - throughout the day Exercise regularly Electronic Signature(s) Signed: 02/10/2020 6:08:32 PM By: Zenaida Deed RN, BSN Signed: 02/10/2020 6:29:31 PM By: Lenda Kelp PA-C Entered By: Zenaida Deed on 02/10/2020 08:46:17 -------------------------------------------------------------------------------- Problem List Details Patient Name: Date of Service: Cheyenne Aran B. 02/10/2020 8:00 A M Medical Record Number: [id number] Patient Account Number: [id number] Date of Birth/Sex: Treating RN: 1961-01-18 (59 y.o. [name] Standard Primary Care Provider: Docia Chuck, [place] Other Clinician: Referring Provider: Treating Provider/Extender: Richardo Priest, [place] Weeks in Treatment: 14 Active  Problems ICD-10 Encounter Code Description Active Date MDM Diagnosis I87.2 Venous insufficiency (chronic) (peripheral) 09/30/2019 No Yes L97.822 Non-pressure chronic ulcer of other part of left lower leg with fat layer exposed 09/30/2019 No Yes L03.116 Cellulitis of left lower limb 12/17/2019 No Yes L97.828 Non-pressure chronic ulcer of other part of left lower leg with other specified 09/30/2019 No Yes severity L97.322 Non-pressure chronic ulcer of left ankle with fat layer exposed 02/03/2020 No Yes 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 02/10/2020 8:00 A M Medical Record Number: [id number] Patient Account Number: [id number] Date of Birth/Sex: Treating RN: 22-Jul-1961 (59 y.o. [name] Standard Primary Care Provider: Docia Chuck, [place] Other Clinician: Referring Provider: Treating Provider/Extender: Richardo Priest, [place] Weeks in Treatment: 9 Subjective Chief Complaint Information obtained from Patient Left LE Ulcers History of Present Illness (HPI) 09/30/2019 upon evaluation today patient presents for initial inspection here in our clinic concerning issues that she has been experiencing with a wound on her left anterior lower leg. She unfortunately had a significant laceration which required 19 sutures and she shows me the pictures both before as well as after sutured in a very good job was done in this regard. Unfortunately the skin did not take and necrosis. Subsequently this wound open and she has been  having more significant issues with that since. Fortunately there is no evidence of active infection at this time which is good news. No fever chills noted. She has a history of chronic venous stasis but is no longer able to wear any compression in fact she cannot even wear regular socks due to her neuropathy and the pain that she experiences in her feet if she does. For that reason she tries to elevate her legs as much as she can at nighttime but again she really cannot wear any type of compression. With regard to antibiotic it does not appear that she has been on anything recently. 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 Fortunately there is no evidence of local or systemic infection. No fevers, chills, nausea, vomiting, or diarrhea. 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 10/07/2019 on evaluation today patient presents for follow-up concerning  her wound on the left lower extremity. She has been tolerating the dressing changes without complication. With that being said she just got the Santyl 2 days ago. Obviously it has not had enough time to really do what we need to do she would like to obviously give this some time before proceeding with more aggressive debridement or otherwise. 10/21/2019 upon evaluation today patient appears to be doing better with regard to the overall appearance of her wound is not quite as dry I do believe the Santyl has helped her improve to some degree here. Fortunately there is no signs of active infection at this time. I think we may be able to clean off the wound with a little bit of additional debridement today and likely we can transition away from the Santyl to some kind of collagen type dressing. 10/28/2019 upon evaluation  today patient actually appears to be doing fairly well with regard to her wound today. She has been tolerating the dressing changes we did switch to collagen and fortunately that seems to be doing well for her at this point. There does not seem to be any signs of active infection I do feel like the surface of the wound is looking better at this time as well. 11/04/2019 upon evaluation today patient appears to be doing well with regard to her wound. She is making some progress here and the wound does appear to be measuring smaller which is good news. Overall this is good to be somewhat slow but nonetheless I am encouraged by the fact that the collagen does seem to have helped her. 11/11/2019 on evaluation today patient's wound is showing some signs of improvement. The good news is her overall trend does seem to be towards getting better compared to where she was previous. The smaller wounds along the side actually might even be healed but we can watch this 1 more week before I heal this out. Overall I feel like there is improvement here. She is still using the silver collagen. 11/18/2019 upon evaluation today patient appears to be doing about the same with regard to her wound. This is not significantly smaller there still appears to be a lot of inflammation. For that reason I do want to see about actually applying triamcinolone to the actual wound bed to see if this can be of benefit as well. I thought about this for couple weeks but I think it may be a good thing to proceed with at this point. 11/25/2019 on evaluation today patient appears to be doing well with regard to her lower extremity ulcer. She has been tolerating the dressing changes without complication. Fortunately there is no signs of active infection at this time. No fevers, chills, nausea, vomiting, or diarrhea. 12/02/2019 on evaluation today patient appears to be doing okay with regard to her ulcer on the leg. Fortunately there is no signs of  active infection at this time. No fever chills noted she has been tolerating the dressing changes without complication. With that being said overall I am pleased with the fact that there is no signs of infection but at the same time I really feel like she needs more to progress moving week to week and right now were really not seeing that. We have put in for approval for Apligraf which I think could help to move things along. With that being said we have not heard anything regarding approval at this point. 12/10/2019; patient comes in on my day although have not seen her previously.  She has what was a traumatic wound on the left anterior mid tibia. Initially sutured however the area never really healed according to the patient. She has been using Santyl now using Prisma. There is exposed tendon. We have made application for Apligraf apparently this is going through [employer] still has not been approved. She arrives in clinic with erythema around the wound and 4 small circular papules superior to the wound. These are nontender and she is not complaining of any more pain. The exact cause of this is not really clear. The patient has idiopathic peripheral neuropathy which for which she has been extensively evaluated in the past she is not a diabetic she does not have any rheumatologic problems she is aware of. She cannot wear compression because it aggravates her neuropathy 4/15; traumatic wound on the left anterior mid tibia. She arrives today with extensive erythema spreading from the wound medially towards her heel. This is tender swollen and warm compatible with cellulitis. We are using silver collagen to the wound 4/20; 5-day follow-up. Traumatic wound on the left anterior mid tibia. This is in follow-up for the cellulitis she had on her visit 5 days ago. I gave her empiric cephalexin I did not culture this. She arrives today with the erythema that we marked much improved. She has not been  systemically unwell Upon evaluation today patient's wound unfortunately is not doing very well this in fact is somewhat deeper than even last week4/28/2021 which is unfortunate. I am very concerned about the fact that to be honest that the patient does not seem to be doing as good as we would have expected in the past 2 weeks since I last saw her. Fortunately there is no evidence of active infection systemically which is good news. No fevers, chills, nausea, vomiting, or diarrhea. She has been on Keflex. 01/06/2020 upon evaluation today patient unfortunately appears to still be doing somewhat poorly overall in regard to her ulcer. There is definite signs of infection. She has been on Keflex in April but unfortunately this just does not seem to be doing as well as we would like. I do believe that we will get a need to place her on Augmentin in order to try to get this better. She tested positive for Proteus and E. coli on the culture and is allergic to doxycycline. The patient's wound currently is not looking healthy at all to me. 01/27/2020 upon evaluation today patient appears to be doing maybe slightly better with regard to the wound bed at this point. Her infection is definitely improved which is great news. As far as the Apligraf I do not believe the wound surface is quite clean enough yet in order for [place] to apply the Apligraf. Another potential option would be for a snap VAC to be used under the Curlex and Coban wrap working to try the wrap today. If we can get rid of some of the fluid I think this will help her with healing also think the snap VAC could be beneficial in this regard. Nonetheless again I am not given up on the Apligraf is a possibility I just do not want a waste the applications by putting it on before the wound surface is ready. Potentially the snap VAC can get this wound to the point where it is ready much more quickly. 02/03/2020 upon evaluation today patient appears to be doing  excellent with regard to her leg ulcer compared to where things have been. The overall quality of the  wound bed is significantly improved and very pleased in this regard. We did get approval for the snap VAC which I am wanting to go ahead and see about initiating at this point. We also have approval for the Apligraf when and if the time is appropriate for this. With that being said I really think using the snap VAC to try to get this to fill-in would be appropriate to begin with. The patient is in agreement with that plan. I do believe the compression did help her and she states that that was uncomfortable she was able to manage with that for now. 02/10/2020 upon evaluation today patient appears to be doing better with regard to her original and largest wound. 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 I explained that pyoderma is incompletely understood and obviously very difficult sometimes to treat but often steroid therapy is the mainstay of treatment. I think topical steroid treatment is appropriate at this point. She does have mometasone which has been prescribed previously by her primary care provider and she has that with her so we can use that on some of the satellite areas not on the main wound. The patient is in agreement with that plan. Objective Constitutional Well-nourished and well-hydrated in no acute distress. Vitals Time Taken: 8:05 AM, Height: 66 in, Weight: 156 lbs, BMI: 25.2, Temperature: 98.6 F, Pulse: 99 bpm, Respiratory Rate: 18 breaths/min, Blood Pressure: 137/86 mmHg. Respiratory normal breathing without difficulty. Psychiatric this patient is able to make decisions and demonstrates good insight into disease process. Alert and Oriented x 3. pleasant and cooperative. General Notes:  Patient's wound bed actually showed signs of good granulation at this time there does appear to be additional granulation which is also good news and fortunately there is no signs of active infection at this time. No fevers, chills, nausea, vomiting, or diarrhea. Integumentary (Hair, Skin) Wound #2 status is Open. Original cause of wound was Trauma. The wound is located on the Left,Anterior Lower Leg. The wound measures 5.2cm length x 3.6cm width x 0.4cm depth; 14.703cm^2 area and 5.881cm^3 volume. There is muscle, tendon, and Fat Layer (Subcutaneous Tissue) Exposed exposed. There is no tunneling or undermining noted. There is a medium amount of serosanguineous drainage noted. The wound margin is epibole. There is medium (34-66%) red granulation within the wound bed. There is a medium (34-66%) amount of necrotic tissue within the wound bed including Adherent Slough. Wound #4 status is Open. Original cause of wound was Gradually Appeared. The wound is located on the Left,Proximal,Anterior Lower Leg. The wound measures 0.6cm length x 0.3cm width x 0.2cm depth; 0.141cm^2 area and 0.028cm^3 volume. There is Fat Layer (Subcutaneous Tissue) Exposed exposed. There is no tunneling or undermining noted. There is a medium amount of serosanguineous drainage noted. The wound margin is distinct with the outline attached to the wound base. There is small (1-33%) pink granulation within the wound bed. There is a large (67-100%) amount of necrotic tissue within the wound bed including Adherent Slough. Wound #5 status is Open. Original cause of wound was Bump. The wound is located on the Left,Medial Malleolus. The wound measures 0.2cm length x 0.2cm width x 0.1cm depth; 0.031cm^2 area and 0.003cm^3 volume. There is Fat Layer (Subcutaneous Tissue) Exposed exposed. There is no tunneling or  undermining noted. There is a small amount of serosanguineous drainage noted. The wound margin is flat and intact. There is small  (1-33%) red granulation within the wound bed. There is a large (67-100%) amount of necrotic tissue within the wound bed including Adherent Slough. 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 Dressing Change Frequency: Wound #2 Left,Anterior Lower Leg: Do not change entire dressing for one week. - remove dressing if SNAP canister fills Wound #4 Left,Proximal,Anterior Lower Leg: Do not change entire dressing for one week. - unless unable to tolerate compression wrap, if not change every other day with iodoflex Wound #5 Left,Medial Malleolus: Do not change entire dressing for one week. - unless unable to tolerate compression wrap, if not change every other day with iodoflex Skin Barriers/Peri-Wound Care: Moisturizing lotion Other: - mometasone cream to irratated areas on leg with dressing changes Wound Cleansing: Wound #2 Left,Anterior Lower Leg: May shower with protection. May shower and wash wound with soap and water. - scrub gently with gauze if dressing changed Primary Wound Dressing: Wound #4 Left,Proximal,Anterior Lower Leg: Iodoflex - thin layer of mometasone cream to wound bed under iodoflex Wound #5 Left,Medial Malleolus: Iodoflex - thin layer of mometasone cream to wound bed under iodoflex Secondary Dressing: Wound #4 Left,Proximal,Anterior Lower Leg: Dry Gauze Wound #5 Left,Medial Malleolus: Dry Gauze Negative Presssure Wound Therapy: Wound #2 Left,Anterior Lower Leg: SNAP Vac to wound  continuously at 138mm/hg pressure - cut drape size down to minimum to hold in place, skin prep under drape Edema Control: Kerlix and Coban - Left Lower Extremity Avoid standing for long periods of time Elevate legs to the level of the heart or above for 30 minutes daily and/or when sitting, a frequency of: - throughout the day Exercise regularly 1. I would recommend currently that we actually continue with the snap VAC as I do believe it is doing a good job for her. The canister did completely filled by Sunday which is actually good it shows that is doing what it needs to do. With that being said we will look towards getting a larger canister going forward although I do not believe we have 1 in stock today. 2. With regard to the satellite lesions working to use some of the mometasone that she has previously prescribed for her on these areas underneath the wrap to try to help with calming down the inflammation and hopefully preventing this from worsening. The patient is in agreement with that plan. 3. I am also going to suggest at this point that we go ahead and continue with the Iodoflex over top of the mometasone for the other open areas at this time since she seems to be doing well with that. We will see patient back for reevaluation in 1 week here in the clinic. If anything worsens or changes patient will contact our office for additional recommendations. Electronic Signature(s) Signed: 02/10/2020 8:51:45 AM By: Lenda Kelp PA-C Entered By: Lenda Kelp on 02/10/2020 08:51:45 -------------------------------------------------------------------------------- SuperBill Details Patient Name: Date of Service: [name] 02/10/2020 Medical Record Number: [id number] Patient Account Number: [id number] Date of Birth/Sex: Treating RN: 1960-11-22 (  59 y.o. [name] Standard Primary Care Provider: Docia Chuck, [place] Other Clinician: Referring Provider: Treating Provider/Extender: Richardo Priest, [place] Weeks in Treatment: 19 Diagnosis Coding ICD-10 Codes Code Description I87.2 Venous insufficiency (chronic) (peripheral) L97.822 Non-pressure chronic ulcer of other part of left lower leg with fat layer exposed L03.116 Cellulitis of left lower limb L97.828 Non-pressure chronic ulcer of other part of left lower leg with other specified severity L97.322 Non-pressure chronic ulcer of left ankle with fat layer exposed 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 I73.00 Raynaud's syndrome without gangrene 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, unspecified Facility Procedures CPT4 Code: [id number] Description: 97607 NEG PRESS WND TX <=50 SQ CM Modifier: Quantity: 1 Physician Procedures : CPT4 Code Description Modifier [id number] - WC PHYS LEVEL 4 - EST PT ICD-10 Diagnosis Description I87.2 Venous insufficiency (chronic) (peripheral) L97.822 Non-pressure chronic ulcer of other part of left lower leg with fat layer exposed L03.116  Cellulitis of left lower limb L97.828 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

## 2020-02-17 ENCOUNTER — Encounter (HOSPITAL_BASED_OUTPATIENT_CLINIC_OR_DEPARTMENT_OTHER): Payer: Worker's Compensation | Admitting: Physician Assistant

## 2020-02-17 ENCOUNTER — Other Ambulatory Visit: Payer: Self-pay

## 2020-02-17 DIAGNOSIS — L97828 Non-pressure chronic ulcer of other part of left lower leg with other specified severity: Secondary | ICD-10-CM | POA: Diagnosis not present

## 2020-02-17 NOTE — Progress Notes (Signed)
Cheyenne, Richardson ([id number]) Visit Report for 02/10/2020 Arrival Information Details Patient Name: Date of Service: [name], [name] 02/10/2020 8:00 A M Medical Record Number: [id number] Patient Account Number: [id number] Date of Birth/Sex: Treating RN: [birth year] (59 y.o. [name] Primary Care Ceilidh Torregrossa: [name], [place] Other Clinician: Referring Fynlee Rowlands: Treating Ruthie Berch/Extender: Dayna Ramus, [place] Weeks in Treatment: 27 Visit Information History Since Last Visit Added or deleted any medications: No Patient Arrived: Ambulatory Any new allergies or adverse reactions: No Arrival Time: 08:03 Had a fall or experienced change in No Accompanied By: self activities of daily living that may affect Transfer Assistance: None risk of falls: Patient Identification Verified: Yes Signs or symptoms of abuse/neglect since last visito No Secondary Verification Process Completed: Yes Hospitalized since last visit: No Patient Requires Transmission-Based Precautions: No Implantable device outside of the clinic excluding No Patient Has Alerts: Yes cellular tissue based products placed in the center since last visit: Has Dressing in Place as Prescribed: Yes Pain Present Now: No Electronic Signature(s) Signed: 02/11/2020 4:20:07 PM By: Sandre Kitty Entered By: Sandre Kitty on 02/10/2020 08:05:38 -------------------------------------------------------------------------------- Encounter Discharge Information Details Patient Name: Date of Service: Cheyenne Mccallum B. 02/10/2020 8:00 A M Medical Record Number: [id number] Patient Account Number: [id number] Date of Birth/Sex: Treating RN: 05-22-1961 (59 y.o. [name] Primary Care Adrienne Trombetta: [name], [place] Other Clinician: Referring Hyman Crossan: Treating Judy Pollman/Extender: Dayna Ramus, [place] Weeks in Treatment: 51 Encounter Discharge Information Items Discharge Condition: Stable Ambulatory Status:  Ambulatory Discharge Destination: Home Transportation: Private Auto Accompanied By: self Schedule Follow-up Appointment: Yes Clinical Summary of Care: Electronic Signature(s) Signed: 02/10/2020 5:57:14 PM By: Deon Pilling Entered By: Deon Pilling on 02/10/2020 09:01:33 -------------------------------------------------------------------------------- Lower Extremity Assessment Details Patient Name: Date of Service: Richardson, Cheyenne 02/10/2020 8:00 A M Medical Record Number: [id number] Patient Account Number: [id number] Date of Birth/Sex: Treating RN: March 30, 1961 (60 y.o. [name] Primary Care Jaylon Grode: [name], [place] Other Clinician: Referring Josten Warmuth: Treating Mysty Kielty/Extender: Dayna Ramus, [place] Weeks in Treatment: 19 Edema Assessment Assessed: [Left: No] [Right: No] Edema: [Left: Ye] [Right: s] Calf Left: Right: Point of Measurement: 29 cm From Medial Instep 33 cm cm Ankle Left: Right: Point of Measurement: 8 cm From Medial Instep 22 cm cm Electronic Signature(s) Signed: 02/17/2020 9:55:31 AM By: Carlene Coria RN Entered By: Carlene Coria on 02/10/2020 08:10:19 -------------------------------------------------------------------------------- [place] Details Patient Name: Date of Service: Cheyenne Mccallum B. 02/10/2020 8:00 A M Medical Record Number: [id number] Patient Account Number: [id number] Date of Birth/Sex: Treating RN: [birth year] (59 y.o. [name] Primary Care Jaella Weinert: [name], [place] Other Clinician: Referring Duayne Brideau: Treating Swetha Rayle/Extender: Dayna Ramus, [place] Weeks in Treatment: 59 Active Inactive Venous Leg Ulcer Nursing Diagnoses: Knowledge deficit related to disease process and management Potential for venous Insuffiency (use before diagnosis confirmed) Goals: Patient will maintain optimal edema control Date Initiated: 09/30/2019 Target Resolution Date: 02/26/2020 Goal Status:  Active Patient/caregiver will verbalize understanding of disease process and disease management Date Initiated: 09/30/2019 Date Inactivated: 11/25/2019 Target Resolution Date: 11/25/2019 Goal Status: Met Interventions: Assess peripheral edema status every visit. Compression as ordered Provide education on venous insufficiency Treatment Activities: Therapeutic compression applied : 09/30/2019 Notes: Wound/Skin Impairment Nursing Diagnoses: Impaired tissue integrity Knowledge deficit related to ulceration/compromised skin integrity Goals: Patient/caregiver will verbalize understanding of skin care regimen Date Initiated: 09/30/2019 Target Resolution Date: 02/26/2020 Goal Status: Active Ulcer/skin breakdown will have a volume reduction of 30% by week 4 Date Initiated: 09/30/2019 Date Inactivated: 10/28/2019 Target Resolution Date: 10/28/2019  Goal Status: Unmet Unmet Reason: other comorbidities Interventions: Assess patient/caregiver ability to obtain necessary supplies Assess patient/caregiver ability to perform ulcer/skin care regimen upon admission and as needed Assess ulceration(s) every visit Provide education on ulcer and skin care Treatment Activities: Skin care regimen initiated : 09/30/2019 Topical wound management initiated : 09/30/2019 Notes: Electronic Signature(s) Signed: 02/10/2020 6:08:32 PM By: Baruch Gouty RN, BSN Entered By: Baruch Gouty on 02/10/2020 08:34:57 -------------------------------------------------------------------------------- Negative Pressure Wound Therapy Maintenance (NPWT) Details Patient Name: Date of Service: Richardson, Cheyenne 02/10/2020 8:00 A M Medical Record Number: [id number] Patient Account Number: [id number] Date of Birth/Sex: Treating RN: 13-Oct-1960 (59 y.o. [name] Primary Care Brindy Higginbotham: [name], [place] Other Clinician: Referring Vaunda Gutterman: Treating Teighan Aubert/Extender: Dayna Ramus, [place] Weeks in Treatment:  19 NPWT Maintenance Performed for: Wound #2 Left, Anterior Lower Leg Additional Injuries Covered: No Performed By: Baruch Gouty, RN Type: Other Coverage Size (sq cm): 18.72 Pressure Type: Constant Pressure Setting: 125 mmHG Drain Type: None Sponge/Dressing Type: Foam, Green Date Initiated: 02/03/2020 Dressing Removed: No Quantity of Sponges/Gauze Removed: pt removed at home Canister Changed: No Canister Exudate Volume: 60 Dressing Reapplied: No Quantity of Sponges/Gauze Inserted: 1 Respones T Treatment: o good Days On NPWT : 8 Post Procedure Diagnosis Same as Pre-procedure Electronic Signature(s) Signed: 02/10/2020 6:08:32 PM By: Baruch Gouty RN, BSN Entered By: Baruch Gouty on 02/10/2020 08:37:46 -------------------------------------------------------------------------------- Pain Assessment Details Patient Name: Date of Service: [name] 02/10/2020 8:00 A M Medical Record Number: [id number] Patient Account Number: [id number] Date of Birth/Sex: Treating RN: 12-05-1960 (59 y.o. [name] Primary Care Isiaah Cuervo: [name], [place] Other Clinician: Referring Giara Mcgaughey: Treating Helina Hullum/Extender: Dayna Ramus, [place] Weeks in Treatment: 57 Active Problems Location of Pain Severity and Description of Pain Patient Has Paino No Site Locations Pain Management and Medication Current Pain Management: Electronic Signature(s) Signed: 02/10/2020 6:08:32 PM By: Baruch Gouty RN, BSN Signed: 02/11/2020 4:20:07 PM By: Sandre Kitty Entered By: Sandre Kitty on 02/10/2020 08:05:31 -------------------------------------------------------------------------------- Patient/Caregiver Education Details Patient Name: Date of Service: Dunbar, Collette B. 6/9/2021andnbsp8:00 [place] Record Number: [id number] Patient Account Number: [id number] Date of Birth/Gender: Treating RN: 05-Nov-1960 (59 y.o. [name] Primary Care Physician: [name],  [place] Other Clinician: Referring Physician: Treating Physician/Extender: Dayna Ramus, [place] Weeks in Treatment: 12 Education Assessment Education Provided To: Patient Education Topics Provided Venous: Methods: Explain/Verbal Responses: Reinforcements needed, State content correctly Wound/Skin Impairment: Methods: Explain/Verbal Responses: Reinforcements needed, State content correctly Electronic Signature(s) Signed: 02/10/2020 6:08:32 PM By: Baruch Gouty RN, BSN Entered By: Baruch Gouty on 02/10/2020 08:35:17 -------------------------------------------------------------------------------- Wound Assessment Details Patient Name: Date of Service: [name] 02/10/2020 8:00 A M Medical Record Number: [id number] Patient Account Number: [id number] Date of Birth/Sex: Treating RN: [birth year] (59 y.o. [name] Primary Care Espyn Radwan: [name], [place] Other Clinician: Referring Neamiah Sciarra: Treating Zakhari Fogel/Extender: Dayna Ramus, [place] Weeks in Treatment: 19 Wound Status Wound Number: 2 Primary Etiology: Venous Leg Ulcer Wound Location: Left, Anterior Lower Leg Wound Status: Open Wounding Event: Trauma Comorbid History: Lymphedema, Raynauds, Neuropathy Date Acquired: 08/06/2019 Weeks Of Treatment: 19 Clustered Wound: No Photos Photo Uploaded By: Mikeal Hawthorne on 02/11/2020 12:10:20 Wound Measurements Length: (cm) 5.2 Width: (cm) 3.6 Depth: (cm) 0.4 Area: (cm) 14.703 Volume: (cm) 5.881 % Reduction in Area: 19.4% % Reduction in Volume: -61.3% Epithelialization: Small (1-33%) Tunneling: No Undermining: No Wound Description Classification: Full Thickness With Exposed Support Structures Wound Margin: Epibole Exudate Amount: Medium Exudate Type: Serosanguineous Exudate Color: red, brown Foul Odor After Cleansing:  No Slough/Fibrino Yes Wound Bed Granulation Amount: Medium (34-66%) Exposed Structure Granulation Quality: Red Fascia  Exposed: No Necrotic Amount: Medium (34-66%) Fat Layer (Subcutaneous Tissue) Exposed: Yes Necrotic Quality: Adherent Slough Tendon Exposed: Yes Muscle Exposed: Yes Necrosis of Muscle: No Joint Exposed: No Bone Exposed: No Electronic Signature(s) Signed: 02/17/2020 9:55:31 AM By: Carlene Coria RN Entered By: Carlene Coria on 02/10/2020 08:13:50 -------------------------------------------------------------------------------- Wound Assessment Details Patient Name: Date of Service: CHARLIEGH, VASUDEVAN 02/10/2020 8:00 A M Medical Record Number: [id number] Patient Account Number: [id number] Date of Birth/Sex: Treating RN: 30-Aug-1961 (59 y.o. [name] Primary Care Haydn Cush: [name], [place] Other Clinician: Referring Kemari Narez: Treating Davi Kroon/Extender: Dayna Ramus, [place] Weeks in Treatment: 19 Wound Status Wound Number: 4 Primary Etiology: Venous Leg Ulcer Wound Location: Left, Proximal, Anterior Lower Leg Wound Status: Open Wounding Event: Gradually Appeared Comorbid History: Lymphedema, Raynauds, Neuropathy Date Acquired: 01/20/2020 Weeks Of Treatment: 2 Clustered Wound: No Photos Photo Uploaded By: Mikeal Hawthorne on 02/11/2020 12:10:21 Wound Measurements Length: (cm) 0.6 Width: (cm) 0.3 Depth: (cm) 0.2 Area: (cm) 0.141 Volume: (cm) 0.028 % Reduction in Area: 40.3% % Reduction in Volume: 60.6% Epithelialization: None Tunneling: No Undermining: No Wound Description Classification: Full Thickness Without Exposed Support Structures Wound Margin: Distinct, outline attached Exudate Amount: Medium Exudate Type: Serosanguineous Exudate Color: red, brown Foul Odor After Cleansing: No Slough/Fibrino Yes Wound Bed Granulation Amount: Small (1-33%) Exposed Structure Granulation Quality: Pink Fascia Exposed: No Necrotic Amount: Large (67-100%) Fat Layer (Subcutaneous Tissue) Exposed: Yes Necrotic Quality: Adherent Slough Tendon Exposed: No Muscle Exposed:  No Joint Exposed: No Bone Exposed: No Electronic Signature(s) Signed: 02/17/2020 9:55:31 AM By: Carlene Coria RN Entered By: Carlene Coria on 02/10/2020 08:15:08 -------------------------------------------------------------------------------- Wound Assessment Details Patient Name: Date of Service: [name] 02/10/2020 8:00 A M Medical Record Number: [id number] Patient Account Number: [id number] Date of Birth/Sex: Treating RN: [birth year] (59 y.o. [name] Primary Care Myranda Pavone: [name], [place] Other Clinician: Referring Tamilyn Lupien: Treating Dayon Witt/Extender: Dayna Ramus, [place] Weeks in Treatment: 19 Wound Status Wound Number: 5 Primary Etiology: Venous Leg Ulcer Wound Location: Left, Medial Malleolus Wound Status: Open Wounding Event: Bump Comorbid History: Lymphedema, Raynauds, Neuropathy Date Acquired: 02/03/2020 Weeks Of Treatment: 1 Clustered Wound: No Photos Photo Uploaded By: Mikeal Hawthorne on 02/11/2020 12:10:41 Wound Measurements Length: (cm) 0.2 Width: (cm) 0.2 Depth: (cm) 0.1 Area: (cm) 0.031 Volume: (cm) 0.003 % Reduction in Area: 56.3% % Reduction in Volume: 57.1% Epithelialization: Small (1-33%) Tunneling: No Undermining: No Wound Description Classification: Full Thickness Without Exposed Support Structures Wound Margin: Flat and Intact Exudate Amount: Small Exudate Type: Serosanguineous Exudate Color: red, brown Foul Odor After Cleansing: No Slough/Fibrino Yes Wound Bed Granulation Amount: Small (1-33%) Exposed Structure Granulation Quality: Red Fascia Exposed: No Necrotic Amount: Large (67-100%) Fat Layer (Subcutaneous Tissue) Exposed: Yes Necrotic Quality: Adherent Slough Tendon Exposed: No Muscle Exposed: No Joint Exposed: No Bone Exposed: No Electronic Signature(s) Signed: 02/17/2020 9:55:31 AM By: Carlene Coria RN Entered By: Carlene Coria on 02/10/2020  08:15:26 -------------------------------------------------------------------------------- Vitals Details Patient Name: Date of Service: Cheyenne Mccallum B. 02/10/2020 8:00 A M Medical Record Number: [id number] Patient Account Number: [id number] Date of Birth/Sex: Treating RN: 1961-03-17 (59 y.o. [name] Primary Care Jamecia Lerman: [name], [place] Other Clinician: Referring Jaira Canady: Treating Dena Esperanza/Extender: Dayna Ramus, [place] Weeks in Treatment: 103 Vital Signs Time Taken: 08:05 Temperature (F): 98.6 Height (in): 66 Pulse (bpm): 99 Weight (lbs): 156 Respiratory Rate (breaths/min): 18 Body Mass Index (BMI): 25.2 Blood Pressure (mmHg): 137/86 Reference Range: 80 -  120 mg / dl Electronic Signature(s) Signed: 02/11/2020 4:20:07 PM By: Sandre Kitty Entered By: Sandre Kitty on 02/10/2020 08:05:21

## 2020-02-17 NOTE — Progress Notes (Addendum)
JAZIYAH, GRADEL ([id number]) Visit Report for 02/17/2020 Chief Complaint Document Details Patient Name: Date of Service: [name], [name] 02/17/2020 8:00 A M Medical Record Number: [id number] Patient Account Number: [id number] Date of Birth/Sex: Treating RN: 1961-03-18 (59 y.o. [name] Standard Primary Care Provider: Docia Chuck, [place] Other Clinician: Referring Provider: Treating Provider/Extender: Richardo Priest, [place] Weeks in Treatment: 20 Information Obtained from: Patient Chief Complaint Left LE Ulcers Electronic Signature(s) Signed: 02/17/2020 8:17:58 AM By: Lenda Kelp PA-C Entered By: Lenda Kelp on 02/17/2020 08:17:57 -------------------------------------------------------------------------------- Cellular or Tissue Based Product Details Patient Name: Date of Service: [name], [name] 02/17/2020 8:00 A M Medical Record Number: [id number] Patient Account Number: [id number] Date of Birth/Sex: Treating RN: Jan 26, 1961 (59 y.o. [name] Standard Primary Care Provider: Docia Chuck, [place] Other Clinician: Referring Provider: Treating Provider/Extender: Richardo Priest, [place] Weeks in Treatment: 20 Cellular or Tissue Based Product Type Wound #2 Left,Anterior Lower Leg Applied to: Performed By: Physician Lenda Kelp, PA Cellular or Tissue Based Product Type: Apligraf Level of Consciousness (Pre-procedure): Awake and Alert Pre-procedure Verification/Time Out Yes - 08:55 Taken: Location: trunk / arms / legs Wound Size (sq cm): 18.87 Product Size (sq cm): 43 Waste Size (sq cm): 0 Amount of Product Applied (sq cm): 43 Instrument Used: Blade, Forceps, Scissors Lot #: [id number] Order #: 1 Expiration Date: 02/19/2020 Fenestrated: Yes Instrument: Blade Reconstituted: Yes Solution Type: saline Solution Amount: 10 ml Lot #: [id number] Solution Expiration Date: 08/02/2021 Secured: Yes Secured With: Steri-Strips Dressing Applied: Yes Primary  Dressing: adaptic, gauze Procedural Pain: 0 Post Procedural Pain: 0 Response to Treatment: Procedure was tolerated well Level of Consciousness (Post- Awake and Alert procedure): Post Procedure Diagnosis Same as Pre-procedure Electronic Signature(s) Signed: 02/18/2020 1:39:37 PM By: Lenda Kelp PA-C Signed: 02/18/2020 5:22:52 PM By: Zenaida Deed RN, BSN Entered By: Zenaida Deed on 02/17/2020 09:11:06 -------------------------------------------------------------------------------- Cellular or Tissue Based Product Details Patient Name: Date of Service: Cheyenne Aran B. 02/17/2020 8:00 A M Medical Record Number: [id number] Patient Account Number: [id number] Date of Birth/Sex: Treating RN: [birth year] (59 y.o. [name] Standard Primary Care Provider: Docia Chuck, [place] Other Clinician: Referring Provider: Treating Provider/Extender: Richardo Priest, [place] Weeks in Treatment: 20 Cellular or Tissue Based Product Type Wound #4 Left,Proximal,Anterior Lower Leg Applied to: Performed By: Physician Lenda Kelp, PA Cellular or Tissue Based Product Type: Apligraf Level of Consciousness (Pre-procedure): Awake and Alert Pre-procedure Verification/Time Out Yes - 08:55 Taken: Location: trunk / arms / legs Wound Size (sq cm): 0.18 Product Size (sq cm): 1 Waste Size (sq cm): 0 Amount of Product Applied (sq cm): 1 Instrument Used: Blade, Forceps, Scissors Lot #: [id number] Order #: 1 Expiration Date: 02/19/2020 Fenestrated: Yes Instrument: Blade Reconstituted: Yes Solution Type: saline Solution Amount: 10 ml Lot #: [id number] Solution Expiration Date: 08/02/2021 Secured: Yes Secured With: Steri-Strips Dressing Applied: Yes Primary Dressing: adaptic, gauze Procedural Pain: 0 Post Procedural Pain: 0 Response to Treatment: Procedure was tolerated well Level of Consciousness (Post- Awake and Alert procedure): Post Procedure Diagnosis Same as Pre-procedure Electronic  Signature(s) Signed: 02/18/2020 1:39:37 PM By: Lenda Kelp PA-C Signed: 02/18/2020 5:22:52 PM By: Zenaida Deed RN, BSN Entered By: Zenaida Deed on 02/17/2020 09:11:32 -------------------------------------------------------------------------------- Debridement Details Patient Name: Date of Service: Cheyenne Aran B. 02/17/2020 8:00 A M Medical Record Number: [id number] Patient Account Number: [id number] Date of Birth/Sex: Treating RN: 02-13-1961 (59 y.o. [name] Standard Primary Care Provider: Docia Chuck, [place] Other Clinician: Referring Provider: Treating Provider/Extender: Richardo Priest, [place] Tania Ade  in Treatment: 20 Debridement Performed for Assessment: Wound #2 Left,Anterior Lower Leg Performed By: Physician Lenda Kelp, PA Debridement Type: Debridement Severity of Tissue Pre Debridement: Fat layer exposed Level of Consciousness (Pre-procedure): Awake and Alert Pre-procedure Verification/Time Out Yes - 08:50 Taken: Start Time: 08:55 Pain Control: Lidocaine 4% T opical Solution T Area Debrided (L x W): otal 5.1 (cm) x 3.7 (cm) = 18.87 (cm) Tissue and other material debrided: Viable, Non-Viable, Slough, Subcutaneous, Slough Level: Skin/Subcutaneous Tissue Debridement Description: Excisional Instrument: Curette Bleeding: Minimum Hemostasis Achieved: Pressure End Time: 08:57 Procedural Pain: 0 Post Procedural Pain: 0 Response to Treatment: Procedure was tolerated well Level of Consciousness (Post- Awake and Alert procedure): Post Debridement Measurements of Total Wound Length: (cm) 5.1 Width: (cm) 3.7 Depth: (cm) 0.4 Volume: (cm) 5.928 Character of Wound/Ulcer Post Debridement: Improved Severity of Tissue Post Debridement: Fat layer exposed Post Procedure Diagnosis Same as Pre-procedure Electronic Signature(s) Signed: 02/18/2020 1:39:37 PM By: Lenda Kelp PA-C Signed: 02/18/2020 5:22:52 PM By: Zenaida Deed RN, BSN Entered By: Zenaida Deed on 02/17/2020 08:59:30 -------------------------------------------------------------------------------- HPI Details Patient Name: Date of Service: Cheyenne Aran B. 02/17/2020 8:00 A M Medical Record Number: [id number] Patient Account Number: [id number] Date of Birth/Sex: Treating RN: [birth year] (59 y.o. [name] Standard Primary Care Provider: Docia Chuck, [place] Other Clinician: Referring Provider: Treating Provider/Extender: Richardo Priest, [place] Weeks in Treatment: 20 History of Present Illness HPI Description: 09/30/2019 upon evaluation today patient presents for initial inspection here in our clinic concerning issues that she has been experiencing with a wound on her left anterior lower leg. She unfortunately had a significant laceration which required 19 sutures and she shows me the pictures both before as well as after sutured in a very good job was done in this regard. Unfortunately the skin did not take and necrosis. Subsequently this wound open and she has been having more significant issues with that since. Fortunately there is no evidence of active infection at this time which is good news. No fever chills noted. She has a history of chronic venous stasis but is no longer able to wear any compression in fact she cannot even wear regular socks due to her neuropathy and the pain that she experiences in her feet if she does. For that reason she tries to elevate her legs as much as she can at nighttime but again she really cannot wear any type of compression. With regard to antibiotic it does not appear that she has been on anything recently. 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 Fortunately there is no evidence of local or systemic infection. No fevers, chills,  nausea, vomiting, or diarrhea. 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 10/07/2019 on evaluation today patient presents for follow-up concerning her wound on the left lower extremity. She has been tolerating the dressing changes without complication. With that being said she just got the Santyl 2 days ago. Obviously it has not had enough time to really do what we need to do she would like to obviously give this some time before proceeding with more aggressive debridement or otherwise. 10/21/2019 upon evaluation today patient appears to be doing better with regard to the overall appearance  of her wound is not quite as dry I do believe the Santyl has helped her improve to some degree here. Fortunately there is no signs of active infection at this time. I think we may be able to clean off the wound with a little bit of additional debridement today and likely we can transition away from the Santyl to some kind of collagen type dressing. 10/28/2019 upon evaluation today patient actually appears to be doing fairly well with regard to her wound today. She has been tolerating the dressing changes we did switch to collagen and fortunately that seems to be doing well for her at this point. There does not seem to be any signs of active infection I do feel like the surface of the wound is looking better at this time as well. 11/04/2019 upon evaluation today patient appears to be doing well with regard to her wound. She is making some progress here and the wound does appear to be measuring smaller which is good news. Overall this is good to be somewhat slow but nonetheless I am encouraged by the fact that the collagen does seem to have helped her. 11/11/2019 on evaluation today patient's wound is showing some signs of improvement. The good news is her overall trend does seem to be towards getting better compared to where she was previous. The smaller wounds along the side actually  might even be healed but we can watch this 1 more week before I heal this out. Overall I feel like there is improvement here. She is still using the silver collagen. 11/18/2019 upon evaluation today patient appears to be doing about the same with regard to her wound. This is not significantly smaller there still appears to be a lot of inflammation. For that reason I do want to see about actually applying triamcinolone to the actual wound bed to see if this can be of benefit as well. I thought about this for couple weeks but I think it may be a good thing to proceed with at this point. 11/25/2019 on evaluation today patient appears to be doing well with regard to her lower extremity ulcer. She has been tolerating the dressing changes without complication. Fortunately there is no signs of active infection at this time. No fevers, chills, nausea, vomiting, or diarrhea. 12/02/2019 on evaluation today patient appears to be doing okay with regard to her ulcer on the leg. Fortunately there is no signs of active infection at this time. No fever chills noted she has been tolerating the dressing changes without complication. With that being said overall I am pleased with the fact that there is no signs of infection but at the same time I really feel like she needs more to progress moving week to week and right now were really not seeing that. We have put in for approval for Apligraf which I think could help to move things along. With that being said we have not heard anything regarding approval at this point. 12/10/2019; patient comes in on my day although have not seen her previously. She has what was a traumatic wound on the left anterior mid tibia. Initially sutured however the area never really healed according to the patient. She has been using Santyl now using Prisma. There is exposed tendon. We have made application for Apligraf apparently this is going through [employer] still has not been  approved. She arrives in clinic with erythema around the wound and 4 small circular papules superior to the wound. These are nontender  and she is not complaining of any more pain. The exact cause of this is not really clear. The patient has idiopathic peripheral neuropathy which for which she has been extensively evaluated in the past she is not a diabetic she does not have any rheumatologic problems she is aware of. She cannot wear compression because it aggravates her neuropathy 4/15; traumatic wound on the left anterior mid tibia. She arrives today with extensive erythema spreading from the wound medially towards her heel. This is tender swollen and warm compatible with cellulitis. We are using silver collagen to the wound 4/20; 5-day follow-up. Traumatic wound on the left anterior mid tibia. This is in follow-up for the cellulitis she had on her visit 5 days ago. I gave her empiric cephalexin I did not culture this. She arrives today with the erythema that we marked much improved. She has not been systemically unwell Upon evaluation today patient's wound unfortunately is not doing very well this in fact is somewhat deeper than even last week4/28/2021 which is unfortunate. I am very concerned about the fact that to be honest that the patient does not seem to be doing as good as we would have expected in the past 2 weeks since I last saw her. Fortunately there is no evidence of active infection systemically which is good news. No fevers, chills, nausea, vomiting, or diarrhea. She has been on Keflex. 01/06/2020 upon evaluation today patient unfortunately appears to still be doing somewhat poorly overall in regard to her ulcer. There is definite signs of infection. She has been on Keflex in April but unfortunately this just does not seem to be doing as well as we would like. I do believe that we will get a need to place her on Augmentin in order to try to get this better. She tested positive for Proteus  and E. coli on the culture and is allergic to doxycycline. The patient's wound currently is not looking healthy at all to me. 01/27/2020 upon evaluation today patient appears to be doing maybe slightly better with regard to the wound bed at this point. Her infection is definitely improved which is great news. As far as the Apligraf I do not believe the wound surface is quite clean enough yet in order for [place] to apply the Apligraf. Another potential option would be for a snap VAC to be used under the Curlex and Coban wrap working to try the wrap today. If we can get rid of some of the fluid I think this will help her with healing also think the snap VAC could be beneficial in this regard. Nonetheless again I am not given up on the Apligraf is a possibility I just do not want a waste the applications by putting it on before the wound surface is ready. Potentially the snap VAC can get this wound to the point where it is ready much more quickly. 02/03/2020 upon evaluation today patient appears to be doing excellent with regard to her leg ulcer compared to where things have been. The overall quality of the wound bed is significantly improved and very pleased in this regard. We did get approval for the snap VAC which I am wanting to go ahead and see about initiating at this point. We also have approval for the Apligraf when and if the time is appropriate for this. With that being said I really think using the snap VAC to try to get this to fill-in would be appropriate to begin with. The patient is in  agreement with that plan. I do believe the compression did help her and she states that that was uncomfortable she was able to manage with that for now. 02/10/2020 upon evaluation today patient appears to be doing better with regard to her original and largest wound. 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 I explained that pyoderma is incompletely understood and obviously very difficult sometimes to treat but often steroid therapy is the mainstay of treatment. I think topical steroid treatment is appropriate at this point. She does have mometasone which has been prescribed previously by her primary care provider and she has that with her so we can use that on some of the satellite areas not on the main wound. The patient is in agreement with that plan. 02/17/2020 upon evaluation today patient appears to be doing well with regard to her wound. She is making good progress with the snap VAC actually believe that she is in a good position for [place] to be able to continue the snap VAC but actually at the Apligraf to try to speed up the granulation and epithelial growth. The patient is actually in agreement with that she wants to try to get this healed as quickly as possible I completely agree with her. She did fill up the canister again today. Electronic Signature(s) Signed: 02/17/2020 9:36:11 AM By: Lenda Kelp PA-C Entered By: Lenda Kelp on 02/17/2020 09:36:10 -------------------------------------------------------------------------------- Physical Exam Details Patient Name: Date of Service: [name], [name] 02/17/2020 8:00 A M Medical Record Number: [id number] Patient Account Number: [id number] Date of Birth/Sex: Treating RN: [birth year] (59 y.o. [name] Standard Primary Care Provider: Docia Chuck, [place] Other Clinician: Referring Provider: Treating Provider/Extender: Richardo Priest, [place] Weeks in Treatment: 20 Constitutional Well-nourished and well-hydrated in no acute distress. Respiratory normal breathing without difficulty. Psychiatric this patient is able to make decisions and demonstrates good insight into disease process. Alert and Oriented x 3. pleasant and cooperative. Notes Upon  inspection patient's wound bed actually showed signs of good granulation and epithelization she has been making good progress which is excellent news there does not appear to be any signs of active infection at this time. No fevers, chills, nausea, vomiting, or diarrhea. Since she did have an excellent wound bed today with no evidence of poor granulation tissue or significant slough buildup I did clean the surface slightly all over in order to make sure there is no biofilm and then apply the Apligraf today. Subsequently I also did secure this with Adaptic followed by Steri-Strips just at the corners. We will look towards applying the snap VAC tomorrow in order to help with drainage control while hopefully the Apligraf helps with improving the overall quality and healing speed of the wound bed. I think this is good to be ideal for the patient. Electronic Signature(s) Signed: 02/17/2020 9:37:16 AM By: Lenda Kelp PA-C Previous Signature: 02/17/2020 9:36:29 AM Version By: Lenda Kelp PA-C Entered By: Lenda Kelp on 02/17/2020 09:37:16 -------------------------------------------------------------------------------- Physician Orders Details Patient Name: Date of Service: [name] 02/17/2020 8:00 A M Medical Record Number: [id number] Patient Account Number: [id number] Date of Birth/Sex: Treating RN: 02/13/1961 (59 y.o. 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 Nurse Visit: Raynelle Dick or Friday for [facility] application Dressing Change Frequency Wound #2 Left,Anterior Lower Leg Do not change entire dressing for one week. - remove dressing if SNAP canister fills, but DO NOT remove adaptic Wound #4 Left,Proximal,Anterior Lower Leg Do not change entire dressing for one week. - unless unable to tolerate compression wrap, if not change every other day with iodoflex Wound #5 Left,Medial Malleolus Do not change entire dressing for one week. - unless unable to tolerate compression wrap, if not change every other day with iodoflex Skin Barriers/Peri-Wound Care Moisturizing lotion Other: - mometasone cream to irritated areas on leg with dressing changes Wound Cleansing Wound #2 Left,Anterior Lower Leg May shower with protection. Primary Wound Dressing Wound #4 Left,Proximal,Anterior Lower Leg pplication - apligraf #1 Skin Substitute A daptic - adaptic secured with steristrips Mepitel or A Wound #5 Left,Medial Malleolus Other: - metasone cream Wound #2 Left,Anterior Lower Leg pplication - apligraf #1 Skin Substitute A daptic - secured with steristrips Mepitel or A Secondary Dressing Wound #4 Left,Proximal,Anterior Lower Leg Dry Gauze Wound #5 Left,Medial Malleolus Dry Gauze Wound #2 Left,Anterior Lower Leg Dry Gauze - until SNAP available Negative Presssure Wound Therapy Wound #2 Left,Anterior Lower Leg SNAP Vac to wound continuously at 121mm/hg pressure - cut drape size down to minimum to hold in place, skin prep under drape, place over apligraf when available Edema Control Kerlix and Coban - Left Lower Extremity Avoid standing for long periods of time Elevate legs to the level of the heart or above for 30 minutes daily  and/or when sitting, a frequency of: - throughout the day Exercise regularly Electronic Signature(s) Signed: 02/18/2020 1:39:37 PM By: Worthy Keeler PA-C Signed: 02/18/2020 5:22:52 PM By: Baruch Gouty RN, BSN Entered By: Baruch Gouty on 02/17/2020 09:10:22 -------------------------------------------------------------------------------- Problem List Details Patient Name: Date of Service: Cheyenne Mccallum B. 02/17/2020 8:00 A M Medical Record Number: [id number] Patient Account Number: [id number] Date of Birth/Sex: Treating RN: 23-Jan-1961 (59 y.o. [name] Primary Care Provider: [name], [place] Other Clinician: Referring Provider: Treating Provider/Extender: Dayna Ramus, [place] Weeks in Treatment: 20 Active Problems ICD-10 Encounter Code Description Active Date MDM Diagnosis I87.2 Venous insufficiency (chronic) (peripheral) 09/30/2019 No Yes L97.822 Non-pressure chronic ulcer of other part of left lower leg with fat layer exposed 09/30/2019 No Yes L03.116 Cellulitis of left lower limb 12/17/2019 No Yes L97.828 Non-pressure chronic ulcer of other part of left lower leg with other specified 09/30/2019 No Yes severity L97.322 Non-pressure chronic ulcer of left ankle with fat layer exposed 02/03/2020 No Yes 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 02/17/2020 8:00 A M Medical Record Number: [id number] Patient Account Number: [id number] Date  of Birth/Sex: Treating RN: 1961/03/15 (59 y.o. [name] Standard Primary Care Provider: Docia Chuck, [place] Other Clinician: Referring Provider: Treating Provider/Extender: Richardo Priest, [place] Weeks in Treatment: 20 Subjective Chief Complaint Information obtained from Patient Left LE Ulcers History of Present Illness (HPI) 09/30/2019 upon evaluation today patient presents for initial inspection here in our clinic concerning issues that she has been experiencing with a wound on her left anterior lower leg. She unfortunately had a significant laceration which required 19 sutures and she shows me the pictures both before as well as after sutured in a very good job was done in this regard. Unfortunately the skin did not take and necrosis. Subsequently this wound open and she has been having more significant issues with that since. Fortunately there is no evidence of active infection at this time which is good news. No fever chills noted. She has a history of chronic venous stasis but is no longer able to wear any compression in fact she cannot even wear regular socks due to her neuropathy and the pain that she experiences in her feet if she does. For that reason she tries to elevate her legs as much as she can at nighttime but again she really cannot wear any type of compression. With regard to antibiotic it does not appear that she has been on anything recently. 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 Fortunately there is no evidence of local or systemic infection. No fevers, chills, nausea, vomiting, or diarrhea. 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 10/07/2019 on evaluation today patient presents for follow-up concerning her wound on the left lower extremity. She has been tolerating the dressing  changes without complication. With that being said she just got the Santyl 2 days ago. Obviously it has not had enough time to really do what we need to do she would like to obviously give this some time before proceeding with more aggressive debridement or otherwise. 10/21/2019 upon evaluation today patient appears to be doing better with regard to the overall appearance of her wound is not quite as dry I do believe the Santyl has helped her improve to some degree here. Fortunately there is no signs of active infection at this time. I think we may be able to clean off the wound with a little bit of additional debridement today and likely we can transition away from the Santyl to some kind of collagen type dressing. 10/28/2019 upon evaluation today patient actually appears to be doing fairly well with regard to her wound today. She has been tolerating the dressing changes we did switch to collagen and fortunately that seems to be doing well for her at this point. There does not seem to be any signs of active infection I do feel like the surface of the wound is looking better at this time as well. 11/04/2019 upon evaluation today patient appears to be doing well with regard to her wound. She is making some progress here and the wound does appear to be measuring smaller which is good news. Overall this is good to be somewhat slow but nonetheless I am encouraged by the fact that the collagen does seem to have helped her.  11/11/2019 on evaluation today patient's wound is showing some signs of improvement. The good news is her overall trend does seem to be towards getting better compared to where she was previous. The smaller wounds along the side actually might even be healed but we can watch this 1 more week before I heal this out. Overall I feel like there is improvement here. She is still using the silver collagen. 11/18/2019 upon evaluation today patient appears to be doing about the same with regard to her  wound. This is not significantly smaller there still appears to be a lot of inflammation. For that reason I do want to see about actually applying triamcinolone to the actual wound bed to see if this can be of benefit as well. I thought about this for couple weeks but I think it may be a good thing to proceed with at this point. 11/25/2019 on evaluation today patient appears to be doing well with regard to her lower extremity ulcer. She has been tolerating the dressing changes without complication. Fortunately there is no signs of active infection at this time. No fevers, chills, nausea, vomiting, or diarrhea. 12/02/2019 on evaluation today patient appears to be doing okay with regard to her ulcer on the leg. Fortunately there is no signs of active infection at this time. No fever chills noted she has been tolerating the dressing changes without complication. With that being said overall I am pleased with the fact that there is no signs of infection but at the same time I really feel like she needs more to progress moving week to week and right now were really not seeing that. We have put in for approval for Apligraf which I think could help to move things along. With that being said we have not heard anything regarding approval at this point. 12/10/2019; patient comes in on my day although have not seen her previously. She has what was a traumatic wound on the left anterior mid tibia. Initially sutured however the area never really healed according to the patient. She has been using Santyl now using Prisma. There is exposed tendon. We have made application for Apligraf apparently this is going through [employer] still has not been approved. She arrives in clinic with erythema around the wound and 4 small circular papules superior to the wound. These are nontender and she is not complaining of any more pain. The exact cause of this is not really clear. The patient has idiopathic peripheral  neuropathy which for which she has been extensively evaluated in the past she is not a diabetic she does not have any rheumatologic problems she is aware of. She cannot wear compression because it aggravates her neuropathy 4/15; traumatic wound on the left anterior mid tibia. She arrives today with extensive erythema spreading from the wound medially towards her heel. This is tender swollen and warm compatible with cellulitis. We are using silver collagen to the wound 4/20; 5-day follow-up. Traumatic wound on the left anterior mid tibia. This is in follow-up for the cellulitis she had on her visit 5 days ago. I gave her empiric cephalexin I did not culture this. She arrives today with the erythema that we marked much improved. She has not been systemically unwell Upon evaluation today patient's wound unfortunately is not doing very well this in fact is somewhat deeper than even last week4/28/2021 which is unfortunate. I am very concerned about the fact that to be honest that the patient does not seem to be doing  as good as we would have expected in the past 2 weeks since I last saw her. Fortunately there is no evidence of active infection systemically which is good news. No fevers, chills, nausea, vomiting, or diarrhea. She has been on Keflex. 01/06/2020 upon evaluation today patient unfortunately appears to still be doing somewhat poorly overall in regard to her ulcer. There is definite signs of infection. She has been on Keflex in April but unfortunately this just does not seem to be doing as well as we would like. I do believe that we will get a need to place her on Augmentin in order to try to get this better. She tested positive for Proteus and E. coli on the culture and is allergic to doxycycline. The patient's wound currently is not looking healthy at all to me. 01/27/2020 upon evaluation today patient appears to be doing maybe slightly better with regard to the wound bed at this point. Her  infection is definitely improved which is great news. As far as the Apligraf I do not believe the wound surface is quite clean enough yet in order for [place] to apply the Apligraf. Another potential option would be for a snap VAC to be used under the Curlex and Coban wrap working to try the wrap today. If we can get rid of some of the fluid I think this will help her with healing also think the snap VAC could be beneficial in this regard. Nonetheless again I am not given up on the Apligraf is a possibility I just do not want a waste the applications by putting it on before the wound surface is ready. Potentially the snap VAC can get this wound to the point where it is ready much more quickly. 02/03/2020 upon evaluation today patient appears to be doing excellent with regard to her leg ulcer compared to where things have been. The overall quality of the wound bed is significantly improved and very pleased in this regard. We did get approval for the snap VAC which I am wanting to go ahead and see about initiating at this point. We also have approval for the Apligraf when and if the time is appropriate for this. With that being said I really think using the snap VAC to try to get this to fill-in would be appropriate to begin with. The patient is in agreement with that plan. I do believe the compression did help her and she states that that was uncomfortable she was able to manage with that for now. 02/10/2020 upon evaluation today patient appears to be doing better with regard to her original and largest wound. 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 I explained that pyoderma is incompletely understood and obviously very difficult sometimes to treat but often steroid therapy is the mainstay of treatment. I think topical  steroid treatment is appropriate at this point. She does have mometasone which has been prescribed previously by her primary care provider and she has that with her so we can use that on some of the satellite areas not on the main wound. The patient is in agreement with that plan. 02/17/2020 upon evaluation today patient appears to be doing well with regard to her wound. She is making good progress with the snap VAC actually believe that she  is in a good position for [place] to be able to continue the snap VAC but actually at the Apligraf to try to speed up the granulation and epithelial growth. The patient is actually in agreement with that she wants to try to get this healed as quickly as possible I completely agree with her. She did fill up the canister again today. Objective Constitutional Well-nourished and well-hydrated in no acute distress. Vitals Time Taken: 8:07 AM, Height: 66 in, Weight: 156 lbs, BMI: 25.2, Temperature: 98.0 F, Pulse: 79 bpm, Respiratory Rate: 18 breaths/min, Blood Pressure: 110/79 mmHg. Respiratory normal breathing without difficulty. Psychiatric this patient is able to make decisions and demonstrates good insight into disease process. Alert and Oriented x 3. pleasant and cooperative. General Notes: Upon inspection patient's wound bed actually showed signs of good granulation and epithelization she has been making good progress which is excellent news there does not appear to be any signs of active infection at this time. No fevers, chills, nausea, vomiting, or diarrhea. Since she did have an excellent wound bed today with no evidence of poor granulation tissue or significant slough buildup I did clean the surface slightly all over in order to make sure there is no biofilm and then apply the Apligraf today. Subsequently I also did secure this with Adaptic followed by Steri-Strips just at the corners. We will look towards applying the snap VAC tomorrow in order to help with  drainage control while hopefully the Apligraf helps with improving the overall quality and healing speed of the wound bed. I think this is good to be ideal for the patient. Integumentary (Hair, Skin) Wound #2 status is Open. Original cause of wound was Trauma. The wound is located on the Left,Anterior Lower Leg. The wound measures 5.1cm length x 3.7cm width x 0.4cm depth; 14.82cm^2 area and 5.928cm^3 volume. There is muscle, tendon, and Fat Layer (Subcutaneous Tissue) Exposed exposed. There is no tunneling or undermining noted. There is a medium amount of serosanguineous drainage noted. The wound margin is epibole. There is medium (34-66%) red granulation within the wound bed. There is a medium (34-66%) amount of necrotic tissue within the wound bed including Adherent Slough. Wound #4 status is Open. Original cause of wound was Gradually Appeared. The wound is located on the Left,Proximal,Anterior Lower Leg. The wound measures 0.6cm length x 0.3cm width x 0.2cm depth; 0.141cm^2 area and 0.028cm^3 volume. There is Fat Layer (Subcutaneous Tissue) Exposed exposed. There is no tunneling or undermining noted. There is a medium amount of serosanguineous drainage noted. The wound margin is distinct with the outline attached to the wound base. There is small (1-33%) pink granulation within the wound bed. There is a large (67-100%) amount of necrotic tissue within the wound bed including Adherent Slough. Wound #5 status is Open. Original cause of wound was Bump. The wound is located on the Left,Medial Malleolus. The wound measures 0.1cm length x 0.1cm width x 0.1cm depth; 0.008cm^2 area and 0.001cm^3 volume. There is Fat Layer (Subcutaneous Tissue) Exposed exposed. There is a small amount of serosanguineous drainage noted. The wound margin is flat and intact. There is small (1-33%) red granulation within the wound bed. There is a large (67-100%) amount of necrotic tissue within the wound bed including Adherent  Slough. 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 There was a Excisional Skin/Subcutaneous Tissue Debridement with a total area of 18.87 sq cm performed by Lenda KelpStone III, Burnette Valenti, PA. With the following instrument(s): Curette to remove Viable and Non-Viable tissue/material. Material removed includes Subcutaneous Tissue and Slough and after achieving pain control using Lidocaine 4% T opical Solution. No specimens were taken. A time out was conducted at 08:50, prior to the start of the procedure. A Minimum amount of bleeding was controlled with Pressure. The procedure was tolerated well with a pain level of 0 throughout and a pain level of 0 following the procedure. Post Debridement Measurements: 5.1cm length x 3.7cm width x 0.4cm depth; 5.928cm^3 volume. Character of Wound/Ulcer Post Debridement is improved. Severity of Tissue Post Debridement is: Fat layer exposed. Post procedure Diagnosis Wound #2: Same as Pre-Procedure Pre-procedure diagnosis of Wound #2 is a Venous Leg Ulcer located on the Left,Anterior Lower Leg. A skin graft procedure using a bioengineered skin substitute/cellular or tissue based product was performed by Lenda KelpStone III, Cheetara Hoge, PA with the following instrument(s): Blade, Forceps, and Scissors. Apligraf was applied and secured with Steri-Strips. 43 sq cm of product was utilized and 0 sq cm was  wasted. Post Application, adaptic, gauze was applied. A Time Out was conducted at 08:55, prior to the start of the procedure. The procedure was tolerated well with a pain level of 0 throughout and a pain level of 0 following the procedure. Post procedure Diagnosis Wound #2: Same as Pre-Procedure . Wound #4 Pre-procedure diagnosis of Wound #4 is a Venous Leg Ulcer located on the Left,Proximal,Anterior Lower Leg. A skin graft procedure using a bioengineered skin substitute/cellular or tissue based product was performed by Lenda KelpStone III, Brondon Wann, PA with the following instrument(s): Blade, Forceps, and Scissors. Apligraf was applied and secured with Steri-Strips. 1 sq cm of product was utilized and 0 sq cm was wasted. Post Application, adaptic, gauze was applied. A Time Out was conducted at 08:55, prior to the start of the procedure. The procedure was tolerated well with a pain level of 0 throughout and a pain level of 0 following the procedure. Post procedure Diagnosis Wound #4: Same as Pre-Procedure . Plan Follow-up Appointments: Return Appointment in 1 week. Nurse Visit: [facility] Molly[facility] Dressing Change Frequency: Wound #2 Left,Anterior Lower Leg: Do not change entire dressing for one week. - remove dressing if SNAP canister fills, but DO NOT remove adaptic Wound #4 Left,Proximal,Anterior Lower Leg: Do not change entire dressing for one week. - unless unable to tolerate compression wrap, if not change every other day with iodoflex Wound #5 Left,Medial Malleolus: Do not change entire dressing for one week. - unless unable to tolerate compression wrap, if not change every other day with iodoflex Skin Barriers/Peri-Wound Care: Moisturizing lotion Other: - mometasone cream to irritated areas on leg with dressing changes Wound Cleansing: Wound #2 Left,Anterior Lower Leg: May shower with protection. Primary Wound Dressing: Wound #4 Left,Proximal,Anterior Lower Leg: Skin  Substitute Application - apligraf #1 Mepitel or Adaptic - adaptic secured with steristrips Wound #5 Left,Medial Malleolus: Other: - metasone cream Wound #2 Left,Anterior Lower Leg: Skin Substitute Application - apligraf #1 Mepitel or Adaptic - secured with steristrips Secondary Dressing: Wound #4 Left,Proximal,Anterior Lower  Leg: Dry Gauze Wound #5 Left,Medial Malleolus: Dry Gauze Wound #2 Left,Anterior Lower Leg: Dry Gauze - until SNAP available Negative Presssure Wound Therapy: Wound #2 Left,Anterior Lower Leg: SNAP Vac to wound continuously at 125mm/hg pressure - cut drape size down to minimum to hold in place, skin prep under drape, place over apligraf when available Edema Control: Kerlix and Coban - Left Lower Extremity Avoid standing for long periods of time Elevate legs to the level of the heart or above for 30 minutes daily and/or when sitting, a frequency of: - throughout the day Exercise regularly 1. I would recommend currently that we go ahead and continue with the wound care measures as before specifically with regard to the snap VAC we do have to get one from [facility] therefore it was not applied today the ones we ordered here unfortunately are delayed. We will apply the Apligraf underneath and in fact I did that today using the entire piece of Apligraf at this point. 2. I am going to suggest as well that we continue with the compression wrap or using a Curlex and Coban wrap to left lower extremity which seems to be doing quite well. 3. I do recommend the patient elevate her legs much as possible though honestly her swelling seems to be doing well and she was even commented on the fact that her leg looks more normal. There is no evidence of infection. We will see patient back for reevaluation in 1 week here in the clinic. If anything worsens or changes patient will contact our office for additional recommendations. Electronic Signature(s) Signed: 02/17/2020 9:38:06 AM  By: Lenda Kelp PA-C Entered By: Lenda Kelp on 02/17/2020 09:38:06 -------------------------------------------------------------------------------- SuperBill Details Patient Name: Date of Service: [name] 02/17/2020 Medical Record Number: [id number] Patient Account Number: [id number] Date of Birth/Sex: Treating RN: [id number] (59 y.o. [name] Standard Primary Care Provider: Docia Chuck, [place] Other Clinician: Referring Provider: Treating Provider/Extender: Richardo Priest, [place] Weeks in Treatment: 20 Diagnosis Coding ICD-10 Codes Code Description I87.2 Venous insufficiency (chronic) (peripheral) L97.822 Non-pressure chronic ulcer of other part of left lower leg with fat layer exposed L03.116 Cellulitis of left lower limb L97.828 Non-pressure chronic ulcer of other part of left lower leg with other specified severity L97.322 Non-pressure chronic ulcer of left ankle with fat layer exposed 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 I73.00 Raynaud's syndrome without gangrene 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, unspecified Facility Procedures CPT4 Code: 81017510 Description: (Facility Use Only) Apligraf 1 SQ CM Modifier: Quantity: 44 CPT4 Code: 25852778 Description: 15271 - SKIN SUB GRAFT TRNK/ARM/LEG ICD-10 Diagnosis Description L97.822 Non-pressure chronic ulcer of other part of left lower leg with fat layer expos Modifier: ed Quantity: 1 Physician Procedures : CPT4 Code Description Modifier [id number] 15271 - WC PHYS SKIN SUB GRAFT TRNK/ARM/LEG ICD-10 Diagnosis Description L97.822 Non-pressure chronic ulcer of other part of left lower leg with fat layer exposed Quantity: 1 Electronic Signature(s) Signed: 02/17/2020 9:38:21 AM By: Lenda Kelp PA-C Entered By: Lenda Kelp on 02/17/2020 09:38:21

## 2020-02-18 ENCOUNTER — Other Ambulatory Visit: Payer: Self-pay

## 2020-02-18 ENCOUNTER — Encounter (HOSPITAL_BASED_OUTPATIENT_CLINIC_OR_DEPARTMENT_OTHER): Payer: Worker's Compensation | Admitting: Internal Medicine

## 2020-02-18 DIAGNOSIS — L97828 Non-pressure chronic ulcer of other part of left lower leg with other specified severity: Secondary | ICD-10-CM | POA: Diagnosis not present

## 2020-02-18 NOTE — Progress Notes (Signed)
VALORA, NORELL ([id number]) Visit Report for 02/17/2020 Arrival Information Details Patient Name: Date of Service: JAMAYAH, MYSZKA 02/17/2020 8:00 A M Medical Record Number: [id number] Patient Account Number: [id number] Date of Birth/Sex: Treating RN: 06/23/1961 (59 y.o. [name] Primary Care Khali Albanese: [name], [place] Other Clinician: Referring Effrey Davidow: Treating Maggi Hershkowitz/Extender: Dayna Ramus, [place] Weeks in Treatment: 20 Visit Information History Since Last Visit Added or deleted any medications: No Patient Arrived: Ambulatory Any new allergies or adverse reactions: No Arrival Time: 08:05 Had a fall or experienced change in No Accompanied By: self activities of daily living that may affect Transfer Assistance: None risk of falls: Patient Identification Verified: Yes Signs or symptoms of abuse/neglect since last visito No Secondary Verification Process Completed: Yes Hospitalized since last visit: No Patient Requires Transmission-Based Precautions: No Implantable device outside of the clinic excluding No Patient Has Alerts: Yes cellular tissue based products placed in the center since last visit: Has Dressing in Place as Prescribed: Yes Pain Present Now: No Electronic Signature(s) Signed: 02/18/2020 12:45:44 PM By: Sandre Kitty Entered By: Sandre Kitty on 02/17/2020 08:07:18 -------------------------------------------------------------------------------- Lower Extremity Assessment Details Patient Name: Date of Service: JERRYE, SEEBECK 02/17/2020 8:00 A M Medical Record Number: [id number] Patient Account Number: [id number] Date of Birth/Sex: Treating RN: 03-14-1961 (59 y.o. [name] Primary Care Gesenia Bantz: [name], [place] Other Clinician: Referring Chip Canepa: Treating Kaz Auld/Extender: Dayna Ramus, [place] Weeks in Treatment: 20 Edema Assessment Assessed: [Left: No] [Right: No] Edema: [Left: Ye] [Right: s] Calf Left:  Right: Point of Measurement: 29 cm From Medial Instep 33 cm cm Ankle Left: Right: Point of Measurement: 8 cm From Medial Instep 21 cm cm Electronic Signature(s) Signed: 02/17/2020 9:54:21 AM By: Carlene Coria RN Entered By: Carlene Coria on 02/17/2020 08:14:32 -------------------------------------------------------------------------------- Union Details Patient Name: Date of Service: [name] 02/17/2020 8:00 A M Medical Record Number: [id number] Patient Account Number: [id number] Date of Birth/Sex: Treating RN: 1961-04-26 (59 y.o. [name] Primary Care Ryder Man: [name], [place] Other Clinician: Referring Marisabel Macpherson: Treating Shaheer Bonfield/Extender: Dayna Ramus, [place] Weeks in Treatment: 20 Active Inactive Venous Leg Ulcer Nursing Diagnoses: Knowledge deficit related to disease process and management Potential for venous Insuffiency (use before diagnosis confirmed) Goals: Patient will maintain optimal edema control Date Initiated: 09/30/2019 Target Resolution Date: 02/26/2020 Goal Status: Active Patient/caregiver will verbalize understanding of disease process and disease management Date Initiated: 09/30/2019 Date Inactivated: 11/25/2019 Target Resolution Date: 11/25/2019 Goal Status: Met Interventions: Assess peripheral edema status every visit. Compression as ordered Provide education on venous insufficiency Treatment Activities: Therapeutic compression applied : 09/30/2019 Notes: Wound/Skin Impairment Nursing Diagnoses: Impaired tissue integrity Knowledge deficit related to ulceration/compromised skin integrity Goals: Patient/caregiver will verbalize understanding of skin care regimen Date Initiated: 09/30/2019 Target Resolution Date: 02/26/2020 Goal Status: Active Ulcer/skin breakdown will have a volume reduction of 30% by week 4 Date Initiated: 09/30/2019 Date Inactivated: 10/28/2019 Target Resolution Date: 10/28/2019 Goal  Status: Unmet Unmet Reason: other comorbidities Interventions: Assess patient/caregiver ability to obtain necessary supplies Assess patient/caregiver ability to perform ulcer/skin care regimen upon admission and as needed Assess ulceration(s) every visit Provide education on ulcer and skin care Treatment Activities: Skin care regimen initiated : 09/30/2019 Topical wound management initiated : 09/30/2019 Notes: Electronic Signature(s) Signed: 02/18/2020 5:22:52 PM By: Baruch Gouty RN, BSN Entered By: Baruch Gouty on 02/17/2020 08:45:32 -------------------------------------------------------------------------------- Pain Assessment Details Patient Name: Date of Service: [name] 02/17/2020 8:00 A M Medical Record Number: [id number] Patient Account Number: [id number] Date of Birth/Sex:  Treating RN: [birth year] (59 y.o. [name] Primary Care Mariko Nowakowski: [name], [place] Other Clinician: Referring Hyden Soley: Treating Berdell Nevitt/Extender: Dayna Ramus, [place] Weeks in Treatment: 20 Active Problems Location of Pain Severity and Description of Pain Patient Has Paino No Site Locations Pain Management and Medication Current Pain Management: Electronic Signature(s) Signed: 02/18/2020 12:45:44 PM By: Sandre Kitty Signed: 02/18/2020 5:22:52 PM By: Baruch Gouty RN, BSN Entered By: Sandre Kitty on 02/17/2020 08:07:49 -------------------------------------------------------------------------------- Patient/Caregiver Education Details Patient Name: Date of Service: Onofrio, Ajanae B. 6/16/2021andnbsp8:00 Ashley Record Number: [id number] Patient Account Number: [id number] Date of Birth/Gender: Treating RN: October 22, 1960 (59 y.o. [name] Primary Care Physician: [name], [place] Other Clinician: Referring Physician: Treating Physician/Extender: Dayna Ramus, [place] Weeks in Treatment: 20 Education Assessment Education Provided  To: Patient Education Topics Provided Venous: Methods: Explain/Verbal Responses: Reinforcements needed, State content correctly Wound/Skin Impairment: Methods: Explain/Verbal Responses: Reinforcements needed, State content correctly Electronic Signature(s) Signed: 02/18/2020 5:22:52 PM By: Baruch Gouty RN, BSN Entered By: Baruch Gouty on 02/17/2020 08:47:08 -------------------------------------------------------------------------------- Wound Assessment Details Patient Name: Date of Service: [name] 02/17/2020 8:00 A M Medical Record Number: [id number] Patient Account Number: [id number] Date of Birth/Sex: Treating RN: 03/13/1961 (59 y.o. [name] Primary Care Lennon Richins: [name], [place] Other Clinician: Referring Candido Flott: Treating Lore Polka/Extender: Dayna Ramus, [place] Weeks in Treatment: 20 Wound Status Wound Number: 2 Primary Etiology: Venous Leg Ulcer Wound Location: Left, Anterior Lower Leg Wound Status: Open Wounding Event: Trauma Comorbid History: Lymphedema, Raynauds, Neuropathy Date Acquired: 08/06/2019 Weeks Of Treatment: 20 Clustered Wound: No Photos Photo Uploaded By: Mikeal Hawthorne on 02/17/2020 16:00:18 Wound Measurements Length: (cm) 5.1 Width: (cm) 3.7 Depth: (cm) 0.4 Area: (cm) 14.82 Volume: (cm) 5.928 % Reduction in Area: 18.7% % Reduction in Volume: -62.5% Epithelialization: Small (1-33%) Tunneling: No Undermining: No Wound Description Classification: Full Thickness With Exposed Support Structures Wound Margin: Epibole Exudate Amount: Medium Exudate Type: Serosanguineous Exudate Color: red, brown Foul Odor After Cleansing: No Slough/Fibrino Yes Wound Bed Granulation Amount: Medium (34-66%) Exposed Structure Granulation Quality: Red Fascia Exposed: No Necrotic Amount: Medium (34-66%) Fat Layer (Subcutaneous Tissue) Exposed: Yes Necrotic Quality: Adherent Slough Tendon Exposed: Yes Muscle Exposed:  Yes Necrosis of Muscle: No Joint Exposed: No Bone Exposed: No Electronic Signature(s) Signed: 02/17/2020 9:54:21 AM By: Carlene Coria RN Entered By: Carlene Coria on 02/17/2020 08:15:49 -------------------------------------------------------------------------------- Wound Assessment Details Patient Name: Date of Service: [name] 02/17/2020 8:00 A M Medical Record Number: [id number] Patient Account Number: [id number] Date of Birth/Sex: Treating RN: [birth year] (59 y.o. [name] Primary Care Roderick Sweezy: [name], [place] Other Clinician: Referring Sharma Lawrance: Treating Bellamie Turney/Extender: Dayna Ramus, [place] Weeks in Treatment: 20 Wound Status Wound Number: 4 Primary Etiology: Venous Leg Ulcer Wound Location: Left, Proximal, Anterior Lower Leg Wound Status: Open Wounding Event: Gradually Appeared Comorbid History: Lymphedema, Raynauds, Neuropathy Date Acquired: 01/20/2020 Weeks Of Treatment: 3 Clustered Wound: No Photos Photo Uploaded By: Mikeal Hawthorne on 02/17/2020 16:00:41 Wound Measurements Length: (cm) 0.6 Width: (cm) 0.3 Depth: (cm) 0.2 Area: (cm) 0.141 Volume: (cm) 0.028 % Reduction in Area: 40.3% % Reduction in Volume: 60.6% Epithelialization: None Tunneling: No Undermining: No Wound Description Classification: Full Thickness Without Exposed Support Structures Wound Margin: Distinct, outline attached Exudate Amount: Medium Exudate Type: Serosanguineous Exudate Color: red, brown Foul Odor After Cleansing: No Slough/Fibrino Yes Wound Bed Granulation Amount: Small (1-33%) Exposed Structure Granulation Quality: Pink Fascia Exposed: No Necrotic Amount: Large (67-100%) Fat Layer (Subcutaneous Tissue) Exposed: Yes Necrotic Quality: Adherent Slough Tendon Exposed:  No Muscle Exposed: No Joint Exposed: No Bone Exposed: No Electronic Signature(s) Signed: 02/17/2020 9:54:21 AM By: Carlene Coria RN Entered By: Carlene Coria on 02/17/2020  08:15:56 -------------------------------------------------------------------------------- Wound Assessment Details Patient Name: Date of Service: CANDE, MASTROPIETRO 02/17/2020 8:00 A M Medical Record Number: [id number] Patient Account Number: [id number] Date of Birth/Sex: Treating RN: 1960/12/31 (59 y.o. [name] Primary Care Zeinab Rodwell: [name], [place] Other Clinician: Referring Haidy Kackley: Treating Angella Montas/Extender: Dayna Ramus, [place] Weeks in Treatment: 20 Wound Status Wound Number: 5 Primary Etiology: Venous Leg Ulcer Wound Location: Left, Medial Malleolus Wound Status: Open Wounding Event: Bump Comorbid History: Lymphedema, Raynauds, Neuropathy Date Acquired: 02/03/2020 Weeks Of Treatment: 2 Clustered Wound: No Photos Photo Uploaded By: Mikeal Hawthorne on 02/17/2020 16:00:19 Wound Measurements Length: (cm) 0.1 Width: (cm) 0.1 Depth: (cm) 0.1 Area: (cm) 0.008 Volume: (cm) 0.001 % Reduction in Area: 88.7% % Reduction in Volume: 85.7% Epithelialization: Small (1-33%) Wound Description Classification: Full Thickness Without Exposed Support Structures Wound Margin: Flat and Intact Exudate Amount: Small Exudate Type: Serosanguineous Exudate Color: red, brown Foul Odor After Cleansing: No Slough/Fibrino Yes Wound Bed Granulation Amount: Small (1-33%) Exposed Structure Granulation Quality: Red Fascia Exposed: No Necrotic Amount: Large (67-100%) Fat Layer (Subcutaneous Tissue) Exposed: Yes Necrotic Quality: Adherent Slough Tendon Exposed: No Muscle Exposed: No Joint Exposed: No Bone Exposed: No Electronic Signature(s) Signed: 02/17/2020 9:54:21 AM By: Carlene Coria RN Entered By: Carlene Coria on 02/17/2020 08:16:01 -------------------------------------------------------------------------------- Vitals Details Patient Name: Date of Service: [name] 02/17/2020 8:00 A M Medical Record Number: [id number] Patient Account Number: [id number] Date  of Birth/Sex: Treating RN: 07/22/1961 (59 y.o. [name] Primary Care Homer Miller: [name], [place] Other Clinician: Referring Zamauri Nez: Treating Jasmaine Rochel/Extender: Dayna Ramus, [place] Weeks in Treatment: 20 Vital Signs Time Taken: 08:07 Temperature (F): 98.0 Height (in): 66 Pulse (bpm): 79 Weight (lbs): 156 Respiratory Rate (breaths/min): 18 Body Mass Index (BMI): 25.2 Blood Pressure (mmHg): 110/79 Reference Range: 80 - 120 mg / dl Electronic Signature(s) Signed: 02/18/2020 12:45:44 PM By: Sandre Kitty Entered By: Sandre Kitty on 02/17/2020 08:07:42

## 2020-02-18 NOTE — Progress Notes (Signed)
Cheyenne, Richardson ([id number]) Visit Report for 02/18/2020 Arrival Information Details Patient Name: Date of Service: Cheyenne, Richardson 02/18/2020 3:15 PM Medical Record Number: [id number] Patient Account Number: [id number] Date of Birth/Sex: Treating RN: 1960/09/09 (59 y.o. [name] Primary Care Christiano Blandon: [name], [place] Other Clinician: Referring Keli Buehner: Treating Rockie Vawter/Extender: Otis Brace, [place] Weeks in Treatment: 20 Visit Information History Since Last Visit Added or deleted any medications: No Patient Arrived: Ambulatory Any new allergies or adverse reactions: No Arrival Time: 16:02 Had a fall or experienced change in No Accompanied By: self activities of daily living that may affect Transfer Assistance: None risk of falls: Patient Identification Verified: Yes Signs or symptoms of abuse/neglect since last visito No Secondary Verification Process Completed: Yes Hospitalized since last visit: No Patient Requires Transmission-Based Precautions: No Implantable device outside of the clinic excluding No Patient Has Alerts: Yes cellular tissue based products placed in the center since last visit: Has Dressing in Place as Prescribed: Yes Has Compression in Place as Prescribed: Yes Pain Present Now: Yes Electronic Signature(s) Signed: 02/18/2020 5:22:52 PM By: Baruch Gouty RN, BSN Entered By: Baruch Gouty on 02/18/2020 16:02:38 -------------------------------------------------------------------------------- Encounter Discharge Information Details Patient Name: Date of Service: [name] 02/18/2020 3:15 PM Medical Record Number: [id number] Patient Account Number: [id number] Date of Birth/Sex: Treating RN: May 23, 1961 (59 y.o. [name] Primary Care Jaimie Pippins: [name], [place] Other Clinician: Referring Gilbert Manolis: Treating Mckensi Redinger/Extender: Otis Brace, [place] Weeks in Treatment: 20 Encounter Discharge Information  Items Discharge Condition: Stable Ambulatory Status: Ambulatory Discharge Destination: Home Transportation: Private Auto Accompanied By: self Schedule Follow-up Appointment: Yes Clinical Summary of Care: Patient Declined Electronic Signature(s) Signed: 02/18/2020 5:22:52 PM By: Baruch Gouty RN, BSN Entered By: Baruch Gouty on 02/18/2020 16:25:25 -------------------------------------------------------------------------------- Negative Pressure Wound Therapy Maintenance (NPWT) Details Patient Name: Date of Service: [name], Richardson 02/18/2020 3:15 PM Medical Record Number: [id number] Patient Account Number: [id number] Date of Birth/Sex: Treating RN: 29-Aug-1961 (59 y.o. [name] Primary Care Shalen Petrak: [name], [place] Other Clinician: Referring Jamilla Galli: Treating Melondy Blanchard/Extender: Otis Brace, [place] Weeks in Treatment: 20 NPWT Maintenance Performed for: Wound #2 Left, Anterior Lower Leg Additional Injuries Covered: No Performed By: Baruch Gouty, RN Type: Other Coverage Size (sq cm): 18.87 Pressure Type: Constant Pressure Setting: 125 mmHG Drain Type: None Sponge/Dressing Type: Foam, Green Date Initiated: 02/03/2020 Dressing Removed: No Canister Changed: No Canister Exudate Volume: 60 Dressing Reapplied: No Quantity of Sponges/Gauze Inserted: 1 Respones T Treatment: o good Days On NPWT : 16 Electronic Signature(s) Signed: 02/18/2020 5:22:52 PM By: Baruch Gouty RN, BSN Entered By: Baruch Gouty on 02/18/2020 16:24:18 -------------------------------------------------------------------------------- Patient/Caregiver Education Details Patient Name: Date of Service: [name] 6/17/2021andnbsp3:15 PM Medical Record Number: [id number] Patient Account Number: [id number] Date of Birth/Gender: Treating RN: 07-15-1961 (59 y.o. [name] Primary Care Physician: [name], [place] Other Clinician: Referring Physician: Treating  Physician/Extender: Otis Brace, [place] Weeks in Treatment: 20 Education Assessment Education Provided To: Patient Education Topics Provided Venous: Methods: Explain/Verbal Responses: Reinforcements needed, State content correctly Electronic Signature(s) Signed: 02/18/2020 5:22:52 PM By: Baruch Gouty RN, BSN Entered By: Baruch Gouty on 02/18/2020 16:25:10 -------------------------------------------------------------------------------- Wound Assessment Details Patient Name: Date of Service: [name] 02/18/2020 3:15 PM Medical Record Number: [id number] Patient Account Number: [id number] Date of Birth/Sex: Treating RN: 18-Jan-1961 (59 y.o. [name] Primary Care Prudy Candy: [name], [place] Other Clinician: Referring Kemyra August: Treating Kassidie Hendriks/Extender: Otis Brace, [place] Weeks in Treatment: 20 Wound Status Wound Number: 2 Primary Etiology: Venous Leg Ulcer Wound Location: Left, Anterior  Lower Leg Wound Status: Open Wounding Event: Trauma Comorbid History: Lymphedema, Raynauds, Neuropathy Date Acquired: 08/06/2019 Weeks Of Treatment: 20 Clustered Wound: No Wound Measurements Length: (cm) 5.1 Width: (cm) 3.7 Depth: (cm) 0.4 Area: (cm) 14.82 Volume: (cm) 5.928 % Reduction in Area: 18.7% % Reduction in Volume: -62.5% Epithelialization: Small (1-33%) Wound Description Classification: Full Thickness With Exposed Support Structures Wound Margin: Epibole Exudate Amount: Medium Exudate Type: Serosanguineous Exudate Color: red, brown Foul Odor After Cleansing: No Slough/Fibrino Yes Wound Bed Granulation Amount: Medium (34-66%) Exposed Structure Granulation Quality: Red Fascia Exposed: No Necrotic Amount: Medium (34-66%) Fat Layer (Subcutaneous Tissue) Exposed: Yes Necrotic Quality: Adherent Slough Tendon Exposed: Yes Muscle Exposed: Yes Necrosis of Muscle: No Joint Exposed: No Bone Exposed: No Assessment Notes unable to  visualize wound bed due to adaptic in place Treatment Notes Wound #2 (Left, Anterior Lower Leg) 2. Periwound Care Moisturizing lotion TCA Cream 6. Support Layer Applied Kerlix/Coban Notes Holiday representative) Signed: 02/18/2020 5:22:52 PM By: Zenaida Deed RN, BSN Entered By: Zenaida Deed on 02/18/2020 16:20:47 -------------------------------------------------------------------------------- Wound Assessment Details Patient Name: Date of Service: [name] 02/18/2020 3:15 PM Medical Record Number: [id number] Patient Account Number: [id number] Date of Birth/Sex: Treating RN: 01/11/1961 (59 y.o. [name] Standard Primary Care Hila Bolding: Docia Chuck, [place] Other Clinician: Referring Serita Degroote: Treating Greco Gastelum/Extender: Carola Rhine, [place] Weeks in Treatment: 20 Wound Status Wound Number: 4 Primary Etiology: Venous Leg Ulcer Wound Location: Left, Proximal, Anterior Lower Leg Wound Status: Open Wounding Event: Gradually Appeared Comorbid History: Lymphedema, Raynauds, Neuropathy Date Acquired: 01/20/2020 Weeks Of Treatment: 3 Clustered Wound: No Wound Measurements Length: (cm) 0.6 Width: (cm) 0.3 Depth: (cm) 0.2 Area: (cm) 0.141 Volume: (cm) 0.028 % Reduction in Area: 40.3% % Reduction in Volume: 60.6% Epithelialization: None Wound Description Classification: Full Thickness Without Exposed Support Structures Wound Margin: Distinct, outline attached Exudate Amount: Medium Exudate Type: Serosanguineous Exudate Color: red, brown Foul Odor After Cleansing: No Slough/Fibrino Yes Wound Bed Granulation Amount: Small (1-33%) Exposed Structure Granulation Quality: Pink Fascia Exposed: No Necrotic Amount: Large (67-100%) Fat Layer (Subcutaneous Tissue) Exposed: Yes Necrotic Quality: Adherent Slough Tendon Exposed: No Muscle Exposed: No Joint Exposed: No Bone Exposed: No Assessment Notes unable to visualize wound bed due to steristrip in  place Electronic Signature(s) Signed: 02/18/2020 5:22:52 PM By: Zenaida Deed RN, BSN Entered By: Zenaida Deed on 02/18/2020 16:21:23 -------------------------------------------------------------------------------- Wound Assessment Details Patient Name: Date of Service: [name] 02/18/2020 3:15 PM Medical Record Number: [id number] Patient Account Number: [id number] Date of Birth/Sex: Treating RN: [birth year] (59 y.o. [name] Standard Primary Care Britlyn Martine: Docia Chuck, [place] Other Clinician: Referring Nazifa Trinka: Treating Jakin Pavao/Extender: Carola Rhine, [place] Weeks in Treatment: 20 Wound Status Wound Number: 5 Primary Etiology: Venous Leg Ulcer Wound Location: Left, Medial Malleolus Wound Status: Open Wounding Event: Bump Comorbid History: Lymphedema, Raynauds, Neuropathy Date Acquired: 02/03/2020 Weeks Of Treatment: 2 Clustered Wound: No Wound Measurements Length: (cm) 0.1 Width: (cm) 0.1 Depth: (cm) 0.1 Area: (cm) 0.008 Volume: (cm) 0.001 % Reduction in Area: 88.7% % Reduction in Volume: 85.7% Epithelialization: Large (67-100%) Tunneling: No Undermining: No Wound Description Classification: Full Thickness Without Exposed Support Structures Wound Margin: Flat and Intact Exudate Amount: None Present Foul Odor After Cleansing: No Slough/Fibrino Yes Wound Bed Granulation Amount: Small (1-33%) Exposed Structure Granulation Quality: Red Fascia Exposed: No Necrotic Amount: None Present (0%) Fat Layer (Subcutaneous Tissue) Exposed: No Tendon Exposed: No Muscle Exposed: No Joint Exposed: No Bone Exposed: No Electronic Signature(s) Signed: 02/18/2020 5:22:52 PM By: Zenaida Deed RN, BSN Entered  By: Zenaida Deed on 02/18/2020 16:21:44 -------------------------------------------------------------------------------- Vitals Details Patient Name: Date of Service: Cheyenne, Richardson 02/18/2020 3:15 PM Medical Record Number: [id number] Patient Account  Number: [id number] Date of Birth/Sex: Treating RN: 1961-05-03 (59 y.o. [name] Standard Primary Care Jevaughn Degollado: Docia Chuck, [place] Other Clinician: Referring Paulyne Mooty: Treating Brazil Voytko/Extender: Carola Rhine, [place] Weeks in Treatment: 20 Vital Signs Time Taken: 16:03 Temperature (F): 98.2 Height (in): 66 Pulse (bpm): 71 Source: Stated Respiratory Rate (breaths/min): 18 Weight (lbs): 156 Blood Pressure (mmHg): 136/83 Source: Stated Reference Range: 80 - 120 mg / dl Body Mass Index (BMI): 25.2 Electronic Signature(s) Signed: 02/18/2020 5:22:52 PM By: Zenaida Deed RN, BSN Entered By: Zenaida Deed on 02/18/2020 16:03:38

## 2020-02-19 NOTE — Progress Notes (Signed)
[name], [name] ([id number]) Visit Report for 02/18/2020 SuperBill Details Patient Name: Date of Service: [name], [name] 02/18/2020 Medical Record Number: [id number] Patient Account Number: [id number] Date of Birth/Sex: Treating RN: 1961/04/08 (59 y.o. 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

## 2020-02-24 ENCOUNTER — Encounter (HOSPITAL_BASED_OUTPATIENT_CLINIC_OR_DEPARTMENT_OTHER): Payer: Worker's Compensation | Admitting: Physician Assistant

## 2020-02-24 DIAGNOSIS — L97828 Non-pressure chronic ulcer of other part of left lower leg with other specified severity: Secondary | ICD-10-CM | POA: Diagnosis not present

## 2020-02-26 NOTE — Progress Notes (Signed)
KYNZI, LEVAY ([id number]) Visit Report for 02/24/2020 Arrival Information Details Patient Name: Date of Service: [name], [name] 02/24/2020 8:00 A M Medical Record Number: [id number] Patient Account Number: [id number] Date of Birth/Sex: Treating RN: [birth year] (59 y.o. Wynelle Link Primary Care Zahid Carneiro: Docia Chuck, Dibas Other Clinician: Referring Riana Tessmer: Treating Skylie Hiott/Extender: Richardo Priest, Dibas Weeks in Treatment: 21 Visit Information History Since Last Visit Added or deleted any medications: No Patient Arrived: Ambulatory Any new allergies or adverse reactions: No Arrival Time: 08:09 Had a fall or experienced change in No Accompanied By: alone activities of daily living that may affect Transfer Assistance: None risk of falls: Patient Identification Verified: Yes Signs or symptoms of abuse/neglect since last visito No Secondary Verification Process Completed: Yes Hospitalized since last visit: No Patient Requires Transmission-Based Precautions: No Implantable device outside of the clinic excluding No Patient Has Alerts: Yes cellular tissue based products placed in the center since last visit: Has Dressing in Place as Prescribed: Yes Has Compression in Place as Prescribed: Yes Pain Present Now: No Electronic Signature(s) Signed: 02/26/2020 5:45:26 PM By: Zandra Abts RN, BSN Entered By: Zandra Abts on 02/24/2020 08:10:05 -------------------------------------------------------------------------------- Encounter Discharge Information Details Patient Name: Date of Service: Cheyenne Aran B. 02/24/2020 8:00 A M Medical Record Number: [id number] Patient Account Number: [id number] Date of Birth/Sex: Treating RN: 1961/05/05 (59 y.o. Wynelle Link Primary Care Ryenn Howeth: Docia Chuck, Dibas Other Clinician: Referring Mykai Wendorf: Treating Lexington Krotz/Extender: Richardo Priest, Dibas Weeks in Treatment: 21 Encounter Discharge Information  Items Discharge Condition: Stable Ambulatory Status: Ambulatory Discharge Destination: Home Transportation: Private Auto Accompanied By: alone Schedule Follow-up Appointment: Yes Clinical Summary of Care: Patient Declined Electronic Signature(s) Signed: 02/26/2020 5:45:26 PM By: Zandra Abts RN, BSN Entered By: Zandra Abts on 02/24/2020 13:14:16 -------------------------------------------------------------------------------- Negative Pressure Wound Therapy Maintenance (NPWT) Details Patient Name: Date of Service: CHAUNTAE, HULTS 02/24/2020 8:00 A M Medical Record Number: [id number] Patient Account Number: [id number] Date of Birth/Sex: Treating RN: October 26, 1960 (59 y.o. Wynelle Link Primary Care Nayellie Sanseverino: Docia Chuck, Dibas Other Clinician: Referring Irmalee Riemenschneider: Treating Genever Hentges/Extender: Richardo Priest, Dibas Weeks in Treatment: 21 NPWT Maintenance Performed for: Wound #2 Left, Anterior Lower Leg Additional Injuries Covered: No Performed By: Zandra Abts, RN Type: Other Coverage Size (sq cm): 18.87 Pressure Type: Constant Pressure Setting: 125 mmHG Drain Type: None Primary Contact: Other : Apligraf, adaptic Sponge/Dressing Type: Foam, Green Date Initiated: 02/03/2020 Dressing Removed: Yes Quantity of Sponges/Gauze Removed: 1 piece blue foam removed Canister Changed: Yes Dressing Reapplied: Yes Quantity of Sponges/Gauze Inserted: 1 piece blue foam inserted Respones T Treatment: o pt tolerated well Days On NPWT : 22 Electronic Signature(s) Signed: 02/26/2020 5:45:26 PM By: Zandra Abts RN, BSN Entered By: Zandra Abts on 02/24/2020 08:17:34 -------------------------------------------------------------------------------- Wound Assessment Details Patient Name: Date of Service: [name] 02/24/2020 8:00 A M Medical Record Number: [id number] Patient Account Number: [id number] Date of Birth/Sex: Treating RN: [birth year] (59 y.o. Wynelle Link Primary Care Bret Stamour: Docia Chuck, Dibas Other Clinician: Referring Jalisa Sacco: Treating Arshawn Valdez/Extender: Richardo Priest, Dibas Weeks in Treatment: 21 Wound Status Wound Number: 2 Primary Etiology: Venous Leg Ulcer Wound Location: Left, Anterior Lower Leg Wound Status: Open Wounding Event: Trauma Comorbid History: Lymphedema, Raynauds, Neuropathy Date Acquired: 08/06/2019 Weeks Of Treatment: 21 Clustered Wound: No Wound Measurements Length: (cm) 5.1 Width: (cm) 3.7 Depth: (cm) 0.4 Area: (cm) 14.82 Volume: (cm) 5.928 % Reduction in Area: 18.7% % Reduction in Volume: -62.5% Epithelialization: Small (1-33%) Tunneling: No Undermining: No Wound Description Classification: Full Thickness With  Exposed Support Structures Wound Margin: Epibole Exudate Amount: Medium Exudate Type: Serosanguineous Exudate Color: red, brown Foul Odor After Cleansing: No Slough/Fibrino Yes Wound Bed Granulation Amount: Medium (34-66%) Exposed Structure Granulation Quality: Red Fascia Exposed: No Necrotic Amount: Medium (34-66%) Fat Layer (Subcutaneous Tissue) Exposed: Yes Necrotic Quality: Adherent Slough Tendon Exposed: Yes Muscle Exposed: Yes Necrosis of Muscle: No Joint Exposed: No Bone Exposed: No Treatment Notes Wound #2 (Left, Anterior Lower Leg) 3. Primary Dressing Applied Cellular Based Tissue Product Other primary dressing (specifiy in notes) 6. Support Layer Applied Kerlix/Coban Notes snap vac over [employer]) Signed: 02/26/2020 5:45:26 PM By: Levan Hurst RN, BSN Entered By: Levan Hurst on 02/24/2020 08:15:52 -------------------------------------------------------------------------------- Wound Assessment Details Patient Name: Date of Service: [name] 02/24/2020 8:00 A M Medical Record Number: [id number] Patient Account Number: [id number] Date of Birth/Sex: Treating RN: [birth year] (59 y.o. [name] Primary Care  Arbutus Nelligan: [name], Dibas Other Clinician: Referring Ta Fair: Treating Ayanna Gheen/Extender: Dayna Ramus, Dibas Weeks in Treatment: 21 Wound Status Wound Number: 4 Primary Etiology: Venous Leg Ulcer Wound Location: Left, Proximal, Anterior Lower Leg Wound Status: Open Wounding Event: Gradually Appeared Comorbid History: Lymphedema, Raynauds, Neuropathy Date Acquired: 01/20/2020 Weeks Of Treatment: 4 Clustered Wound: No Wound Measurements Length: (cm) 0.6 Width: (cm) 0.3 Depth: (cm) 0.2 Area: (cm) 0.141 Volume: (cm) 0.028 % Reduction in Area: 40.3% % Reduction in Volume: 60.6% Epithelialization: None Tunneling: No Undermining: No Wound Description Classification: Full Thickness Without Exposed Support Structures Wound Margin: Distinct, outline attached Exudate Amount: Medium Exudate Type: Serosanguineous Exudate Color: red, brown Foul Odor After Cleansing: No Slough/Fibrino Yes Wound Bed Granulation Amount: Small (1-33%) Exposed Structure Granulation Quality: Pink Fascia Exposed: No Necrotic Amount: Large (67-100%) Fat Layer (Subcutaneous Tissue) Exposed: Yes Necrotic Quality: Adherent Slough Tendon Exposed: No Muscle Exposed: No Joint Exposed: No Bone Exposed: No Treatment Notes Wound #4 (Left, Proximal, Anterior Lower Leg) 3. Primary Dressing Applied Cellular Based Tissue Product 4. Secondary Dressing Dry Gauze 6. Support Layer Applied Kerlix/Coban Notes Apligraf and Therapist, sports) Signed: 02/26/2020 5:45:26 PM By: Levan Hurst RN, BSN Entered By: Levan Hurst on 02/24/2020 08:16:02 -------------------------------------------------------------------------------- Wound Assessment Details Patient Name: Date of Service: [name] 02/24/2020 8:00 A M Medical Record Number: [id number] Patient Account Number: [id number] Date of Birth/Sex: Treating RN: 1961-07-12 (59 y.o. [name] Primary Care Dorcus Riga:  [name], Dibas Other Clinician: Referring Adelaide Pfefferkorn: Treating Rapheal Masso/Extender: Dayna Ramus, Dibas Weeks in Treatment: 21 Wound Status Wound Number: 5 Primary Etiology: Venous Leg Ulcer Wound Location: Left, Medial Malleolus Wound Status: Open Wounding Event: Bump Comorbid History: Lymphedema, Raynauds, Neuropathy Date Acquired: 02/03/2020 Weeks Of Treatment: 3 Clustered Wound: No Wound Measurements Length: (cm) 0.1 Width: (cm) 0.1 Depth: (cm) 0.1 Area: (cm) 0.008 Volume: (cm) 0.001 % Reduction in Area: 88.7% % Reduction in Volume: 85.7% Epithelialization: Large (67-100%) Tunneling: No Undermining: No Wound Description Classification: Full Thickness Without Exposed Support Structures Wound Margin: Flat and Intact Exudate Amount: None Present Foul Odor After Cleansing: No Slough/Fibrino Yes Wound Bed Granulation Amount: Small (1-33%) Exposed Structure Granulation Quality: Red Fascia Exposed: No Necrotic Amount: None Present (0%) Fat Layer (Subcutaneous Tissue) Exposed: No Tendon Exposed: No Muscle Exposed: No Joint Exposed: No Bone Exposed: No Treatment Notes Wound #5 (Left, Medial Malleolus) 1. Cleanse With Soap and water 3. Primary Dressing Applied Other primary dressing (specifiy in notes) 4. Secondary Dressing Dry Gauze 6. Support Layer Applied Kerlix/Coban Notes Actuary) Signed: 02/26/2020 5:45:26 PM By: Levan Hurst RN,  BSN Entered By: Zandra Abts on 02/24/2020 08:16:09 -------------------------------------------------------------------------------- Vitals Details Patient Name: Date of Service: MIKERIA, VALIN 02/24/2020 8:00 A M Medical Record Number: [id number] Patient Account Number: [id number] Date of Birth/Sex: Treating RN: 1961/04/14 (59 y.o. Wynelle Link Primary Care Aanchal Cope: Docia Chuck, Dibas Other Clinician: Referring Aswad Wandrey: Treating Haunani Dickard/Extender: Richardo Priest,  Dibas Weeks in Treatment: 21 Vital Signs Time Taken: 08:10 Temperature (F): 97.9 Height (in): 66 Pulse (bpm): 80 Weight (lbs): 156 Respiratory Rate (breaths/min): 18 Body Mass Index (BMI): 25.2 Blood Pressure (mmHg): 112/78 Reference Range: 80 - 120 mg / dl Electronic Signature(s) Signed: 02/26/2020 5:45:26 PM By: Zandra Abts RN, BSN Entered By: Zandra Abts on 02/24/2020 08:10:26

## 2020-02-26 NOTE — Progress Notes (Signed)
[name], [name] ([id number]) Visit Report for 02/24/2020 SuperBill Details Patient Name: Date of Service: [name], [name] 02/24/2020 Medical Record Number: [id number] Patient Account Number: [id number] Date of Birth/Sex: Treating RN: [birth year] (59 y.o. 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

## 2020-03-02 ENCOUNTER — Encounter (HOSPITAL_BASED_OUTPATIENT_CLINIC_OR_DEPARTMENT_OTHER): Payer: Worker's Compensation | Admitting: Physician Assistant

## 2020-03-02 DIAGNOSIS — L97828 Non-pressure chronic ulcer of other part of left lower leg with other specified severity: Secondary | ICD-10-CM | POA: Diagnosis not present

## 2020-03-02 NOTE — Progress Notes (Signed)
[name], [name] ([id number]) Visit Report for 03/02/2020 Chief Complaint Document Details Patient Name: Date of Service: [name], [name] 03/02/2020 8:00 A M Medical Record Number: [id number] Patient Account Number: [id number] Date of Birth/Sex: Treating RN: 09-14-1960 (59 y.o. [name] Standard Primary Care Provider: Docia Chuck, [place] Other Clinician: Referring Provider: Treating Provider/Extender: Richardo Priest, [place] Weeks in Treatment: 22 Information Obtained from: Patient Chief Complaint Left LE Ulcers Electronic Signature(s) Signed: 03/02/2020 5:59:30 PM By: Lenda Kelp PA-C Entered By: Lenda Kelp on 03/02/2020 08:18:30 -------------------------------------------------------------------------------- Cellular or Tissue Based Product Details Patient Name: Date of Service: [name], [name] 03/02/2020 8:00 A M Medical Record Number: [id number] Patient Account Number: [id number] Date of Birth/Sex: Treating RN: October 15, 1960 (59 y.o. [name] Standard Primary Care Provider: Docia Chuck, [place] Other Clinician: Referring Provider: Treating Provider/Extender: Richardo Priest, [place] Weeks in Treatment: 22 Cellular or Tissue Based Product Type Wound #2 Left,Anterior Lower Leg Applied to: Performed By: Physician Lenda Kelp, PA Cellular or Tissue Based Product Type: Apligraf Level of Consciousness (Pre-procedure): Awake and Alert Pre-procedure Verification/Time Out Yes - 09:20 Taken: Location: trunk / arms / legs Wound Size (sq cm): 15.64 Product Size (sq cm): 42 Waste Size (sq cm): 0 Amount of Product Applied (sq cm): 42 Instrument Used: Blade, Forceps, Scissors Lot #: [id number] Order #: 2 Expiration Date: 03/09/2020 Fenestrated: Yes Instrument: Blade Reconstituted: Yes Solution Type: normal saline Solution Amount: 10 ml Lot #: [id number] Solution Expiration Date: 05/03/2021 Secured: Yes Secured With: Steri-Strips Dressing Applied: Yes Primary  Dressing: Adaptic, SNAP VAC Procedural Pain: 0 Post Procedural Pain: 0 Response to Treatment: Procedure was tolerated well Level of Consciousness (Post- Awake and Alert procedure): Post Procedure Diagnosis Same as Pre-procedure Electronic Signature(s) Signed: 03/02/2020 5:49:12 PM By: Zenaida Deed RN, BSN Signed: 03/02/2020 5:59:30 PM By: Lenda Kelp PA-C Entered By: Zenaida Deed on 03/02/2020 09:23:30 -------------------------------------------------------------------------------- Cellular or Tissue Based Product Details Patient Name: Date of Service: [name] 03/02/2020 8:00 A M Medical Record Number: [id number] Patient Account Number: [id number] Date of Birth/Sex: Treating RN: [birth year] (59 y.o. [name] Standard Primary Care Provider: Docia Chuck, [place] Other Clinician: Referring Provider: Treating Provider/Extender: Richardo Priest, [place] Weeks in Treatment: 22 Cellular or Tissue Based Product Type Wound #4 Left,Proximal,Anterior Lower Leg Applied to: Performed By: Physician Lenda Kelp, PA Cellular or Tissue Based Product Type: Apligraf Level of Consciousness (Pre-procedure): Awake and Alert Pre-procedure Verification/Time Out Yes - 09:20 Taken: Location: trunk / arms / legs Wound Size (sq cm): 0.4 Product Size (sq cm): 2 Waste Size (sq cm): 0 Amount of Product Applied (sq cm): 2 Instrument Used: Blade, Forceps, Scissors Lot #: [id number] Order #: 2 Expiration Date: 03/09/2020 Fenestrated: Yes Instrument: Blade Reconstituted: Yes Solution Type: normal saline Solution Amount: 10 ml Lot #: [id number] Solution Expiration Date: 05/03/2021 Secured: Yes Secured With: Steri-Strips Dressing Applied: Yes Primary Dressing: gauze Procedural Pain: 0 Post Procedural Pain: 0 Response to Treatment: Procedure was tolerated well Level of Consciousness (Post- Awake and Alert procedure): Post Procedure Diagnosis Same as Pre-procedure Electronic  Signature(s) Signed: 03/02/2020 5:49:12 PM By: Zenaida Deed RN, BSN Signed: 03/02/2020 5:59:30 PM By: Lenda Kelp PA-C Entered By: Zenaida Deed on 03/02/2020 09:24:12 -------------------------------------------------------------------------------- HPI Details Patient Name: Date of Service: Cheyenne Aran B. 03/02/2020 8:00 A M Medical Record Number: [id number] Patient Account Number: [id number] Date of Birth/Sex: Treating RN: 29-Nov-1960 (59 y.o. [name] Standard Primary Care Provider: Docia Chuck, [place] Other Clinician: Referring Provider: Treating Provider/Extender: Richardo Priest,  [place] Weeks in Treatment: 22 History of Present Illness HPI Description: 09/30/2019 upon evaluation today patient presents for initial inspection here in our clinic concerning issues that she has been experiencing with a wound on her left anterior lower leg. She unfortunately had a significant laceration which required 19 sutures and she shows me the pictures both before as well as after sutured in a very good job was done in this regard. Unfortunately the skin did not take and necrosis. Subsequently this wound open and she has been having more significant issues with that since. Fortunately there is no evidence of active infection at this time which is good news. No fever chills noted. She has a history of chronic venous stasis but is no longer able to wear any compression in fact she cannot even wear regular socks due to her neuropathy and the pain that she experiences in her feet if she does. For that reason she tries to elevate her legs as much as she can at nighttime but again she really cannot wear any type of compression. With regard to antibiotic it does not appear that she has been on anything recently. 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 Fortunately there is no evidence of local or systemic infection. No fevers, chills, nausea, vomiting, or diarrhea. 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 10/07/2019 on evaluation today patient presents for follow-up concerning her wound on the left lower extremity. She has been tolerating the dressing changes without complication. With that being said she just got the Santyl 2 days ago. Obviously it has not had enough time to really do what we need to do she would like to obviously give this some time before proceeding with more aggressive debridement or otherwise. 10/21/2019 upon evaluation today patient appears to be doing better with regard to the overall appearance of her wound is not quite as dry I do believe the Santyl has helped her improve to some degree here. Fortunately there is no signs of active infection at this time. I think we may be able to clean off the wound with a little bit of additional debridement today and likely we can transition away from the Santyl to some kind of collagen type dressing. 10/28/2019 upon evaluation today patient actually appears to be doing fairly well with regard to her wound today. She has been tolerating the dressing changes we did switch to collagen and fortunately that seems to be doing well for her at this point. There does not seem to be any signs of active infection I do feel like the surface of the wound is looking better at this time as well. 11/04/2019 upon evaluation today patient appears to be doing well with regard to her wound. She is making some progress here and the wound does appear to be measuring smaller which is good news. Overall this is good to be somewhat slow but nonetheless I am encouraged by the fact that the collagen does seem to have helped her. 11/11/2019 on evaluation today patient's wound is showing some signs of improvement. The good news is  her overall trend does seem to be towards getting better compared to where she was previous. The smaller  wounds along the side actually might even be healed but we can watch this 1 more week before I heal this out. Overall I feel like there is improvement here. She is still using the silver collagen. 11/18/2019 upon evaluation today patient appears to be doing about the same with regard to her wound. This is not significantly smaller there still appears to be a lot of inflammation. For that reason I do want to see about actually applying triamcinolone to the actual wound bed to see if this can be of benefit as well. I thought about this for couple weeks but I think it may be a good thing to proceed with at this point. 11/25/2019 on evaluation today patient appears to be doing well with regard to her lower extremity ulcer. She has been tolerating the dressing changes without complication. Fortunately there is no signs of active infection at this time. No fevers, chills, nausea, vomiting, or diarrhea. 12/02/2019 on evaluation today patient appears to be doing okay with regard to her ulcer on the leg. Fortunately there is no signs of active infection at this time. No fever chills noted she has been tolerating the dressing changes without complication. With that being said overall I am pleased with the fact that there is no signs of infection but at the same time I really feel like she needs more to progress moving week to week and right now were really not seeing that. We have put in for approval for Apligraf which I think could help to move things along. With that being said we have not heard anything regarding approval at this point. 12/10/2019; patient comes in on my day although have not seen her previously. She has what was a traumatic wound on the left anterior mid tibia. Initially sutured however the area never really healed according to the patient. She has been using Santyl now using Prisma. There is  exposed tendon. We have made application for Apligraf apparently this is going through [employer] still has not been approved. She arrives in clinic with erythema around the wound and 4 small circular papules superior to the wound. These are nontender and she is not complaining of any more pain. The exact cause of this is not really clear. The patient has idiopathic peripheral neuropathy which for which she has been extensively evaluated in the past she is not a diabetic she does not have any rheumatologic problems she is aware of. She cannot wear compression because it aggravates her neuropathy 4/15; traumatic wound on the left anterior mid tibia. She arrives today with extensive erythema spreading from the wound medially towards her heel. This is tender swollen and warm compatible with cellulitis. We are using silver collagen to the wound 4/20; 5-day follow-up. Traumatic wound on the left anterior mid tibia. This is in follow-up for the cellulitis she had on her visit 5 days ago. I gave her empiric cephalexin I did not culture this. She arrives today with the erythema that we marked much improved. She has not been systemically unwell Upon evaluation today patient's wound unfortunately is not doing very well this in fact is somewhat deeper than even last week4/28/2021 which is unfortunate. I am very concerned about the fact that to be honest that the patient does not seem to be doing as good as we would have expected in the past 2 weeks since I last saw her. Fortunately there is no evidence of active infection systemically which is good news. No fevers, chills, nausea, vomiting,  or diarrhea. She has been on Keflex. 01/06/2020 upon evaluation today patient unfortunately appears to still be doing somewhat poorly overall in regard to her ulcer. There is definite signs of infection. She has been on Keflex in April but unfortunately this just does not seem to be doing as well as we would like. I  do believe that we will get a need to place her on Augmentin in order to try to get this better. She tested positive for Proteus and E. coli on the culture and is allergic to doxycycline. The patient's wound currently is not looking healthy at all to me. 01/27/2020 upon evaluation today patient appears to be doing maybe slightly better with regard to the wound bed at this point. Her infection is definitely improved which is great news. As far as the Apligraf I do not believe the wound surface is quite clean enough yet in order for [place] to apply the Apligraf. Another potential option would be for a snap VAC to be used under the Curlex and Coban wrap working to try the wrap today. If we can get rid of some of the fluid I think this will help her with healing also think the snap VAC could be beneficial in this regard. Nonetheless again I am not given up on the Apligraf is a possibility I just do not want a waste the applications by putting it on before the wound surface is ready. Potentially the snap VAC can get this wound to the point where it is ready much more quickly. 02/03/2020 upon evaluation today patient appears to be doing excellent with regard to her leg ulcer compared to where things have been. The overall quality of the wound bed is significantly improved and very pleased in this regard. We did get approval for the snap VAC which I am wanting to go ahead and see about initiating at this point. We also have approval for the Apligraf when and if the time is appropriate for this. With that being said I really think using the snap VAC to try to get this to fill-in would be appropriate to begin with. The patient is in agreement with that plan. I do believe the compression did help her and she states that that was uncomfortable she was able to manage with that for now. 02/10/2020 upon evaluation today patient appears to be doing better with regard to her original and largest wound. 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 I explained that pyoderma is incompletely understood and obviously very difficult sometimes to treat but often steroid therapy is the mainstay of treatment. I think topical steroid treatment is appropriate at this point. She does have mometasone which has been prescribed previously by her primary care provider and she has that with her so we can use that on some of the satellite areas not on the main wound. The patient is in agreement with that plan. 02/17/2020 upon evaluation today patient appears to be doing well with regard to her wound. She is making good progress with the snap VAC actually believe that she is in a good position for [place] to be able to continue the snap VAC but actually at the Apligraf to try to speed up the granulation and epithelial growth. The patient is actually in  agreement with that she wants to try to get this healed as quickly as possible I completely agree with her. She did fill up the canister again today. 03/02/2020 upon evaluation today patient appears to be doing well at this time with regard to her wound. In fact this is dramatically improved with the use of the snap VAC and the Apligraf even since the last time I saw her 2 weeks ago. Again week she had a nurse visit and changed out the snap VAC last week. Overall I think that she is definitely in a much better place and this wound is definitely making progress measuring smaller and overall I think progressing quite well. Electronic Signature(s) Signed: 03/02/2020 9:44:12 AM By: Lenda Kelp PA-C Entered By: Lenda Kelp on 03/02/2020 09:44:11 -------------------------------------------------------------------------------- Physical Exam Details Patient Name: Date of Service: [name], [name] 03/02/2020  8:00 A M Medical Record Number: [id number] Patient Account Number: [id number] Date of Birth/Sex: Treating RN: Nov 22, 1960 (59 y.o. [name] Standard Primary Care Provider: Docia Chuck, [place] Other Clinician: Referring Provider: Treating Provider/Extender: Richardo Priest, [place] Weeks in Treatment: 22 Constitutional Well-nourished and well-hydrated in no acute distress. Respiratory normal breathing without difficulty. Psychiatric this patient is able to make decisions and demonstrates good insight into disease process. Alert and Oriented x 3. pleasant and cooperative. Notes Upon inspection patient's wound bed actually showed signs of good granulation at this time there does not appear to be any evidence of active infection which is great news and overall I am very pleased with where things stand. The patient likewise is very happy to see the improvement. We did finally get approval to reinitiate the snap VAC today along with applying the Apligraf as well. Electronic Signature(s) Signed: 03/02/2020 9:44:37 AM By: Lenda Kelp PA-C Entered By: Lenda Kelp on 03/02/2020 09:44:37 -------------------------------------------------------------------------------- Physician Orders Details Patient Name: Date of Service: [name] 03/02/2020 8:00 A M Medical Record Number: [id number] Patient Account Number: [id number] Date of Birth/Sex: Treating RN: 11-09-1960 (59 y.o. 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 - Tues or Thurs for next apligraf Return A Nurse Visit: - 1 week post apligraf and SNAP change Dressing Change Frequency Wound #2 Left,Anterior Lower Leg Do not change entire dressing for one week. - DO NOT remove adaptic Wound #4 Left,Proximal,Anterior Lower Leg Do not change entire dressing for one week. Skin Barriers/Peri-Wound Care Moisturizing lotion Other: - mometasone cream to irritated areas on leg with dressing changes Wound Cleansing Wound #2 Left,Anterior Lower Leg May shower with protection. Primary Wound Dressing Wound #2 Left,Anterior Lower Leg pplication - apligraf #2 Skin Substitute A daptic - secured with steristrips Mepitel or A Wound #4 Left,Proximal,Anterior Lower Leg pplication - apligraf #1 Skin Substitute A daptic - adaptic secured with steristrips Mepitel or A Secondary Dressing Wound #4 Left,Proximal,Anterior Lower Leg Dry Gauze Negative Presssure Wound Therapy Wound #2 Left,Anterior Lower Leg SNAP Vac  to wound continuously at 176mm/hg pressure - cut drape size down to minimum to hold in place, skin prep under drape, Edema Control Kerlix and Coban - Left Lower Extremity Avoid standing for long periods of time Elevate legs to the level of the heart or above for 30 minutes daily and/or when sitting, a frequency of: - throughout the day Exercise regularly Electronic Signature(s) Signed: 03/02/2020 5:49:12 PM By: Zenaida Deed RN, BSN Signed: 03/02/2020 5:59:30 PM By: Lenda Kelp PA-C Entered By: Zenaida Deed on 03/02/2020 09:27:30 -------------------------------------------------------------------------------- Problem List Details Patient Name: Date of Service: Cheyenne Aran B. 03/02/2020 8:00 A M Medical Record Number: [id number] Patient Account Number: [id number] Date of Birth/Sex: Treating RN: 1961/07/22 (59 y.o. [name] Standard Primary Care Provider: Docia Chuck, [place] Other Clinician: Referring Provider: Treating Provider/Extender: Richardo Priest, [place] Weeks in Treatment: 22 Active Problems ICD-10 Encounter Code Description Active Date MDM Diagnosis I87.2 Venous insufficiency (chronic) (peripheral) 09/30/2019 No Yes L97.822 Non-pressure chronic ulcer of other part of left lower leg with fat layer exposed 09/30/2019 No Yes L03.116 Cellulitis of left lower limb 12/17/2019 No Yes L97.828 Non-pressure chronic ulcer of other part of left lower leg with other specified 09/30/2019 No Yes severity L97.322 Non-pressure chronic ulcer of left ankle with fat layer exposed 02/03/2020 No Yes 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 03/02/2020 8:00 A M Medical Record Number: [id number] Patient Account Number: [id number] Date of Birth/Sex: Treating RN: 07-21-1961 (59 y.o. [name] Standard Primary Care Provider: Docia Chuck, [place] Other Clinician: Referring Provider: Treating Provider/Extender: Richardo Priest, [place] Weeks in Treatment: 22 Subjective Chief Complaint Information obtained from Patient Left LE Ulcers History of Present Illness (HPI) 09/30/2019 upon evaluation today patient presents for initial inspection here in our clinic concerning issues that she has been  experiencing with a wound on her left anterior lower leg. She unfortunately had a significant laceration which required 19 sutures and she shows me the pictures both before as well as after sutured in a very good job was done in this regard. Unfortunately the skin did not take and necrosis. Subsequently this wound open and she has been having more significant issues with that since. Fortunately there is no evidence of active infection at this time which is good news. No fever chills noted. She has a history of chronic venous stasis but is no longer able to wear any compression in fact she cannot even wear regular socks due to her neuropathy and the pain that she experiences in her feet if she does. For that reason she tries to elevate her legs as much as she can at nighttime but again she really cannot wear any type of compression. With regard to antibiotic it does not appear that she has been on anything recently. 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 Fortunately there is no evidence of local or systemic infection. No fevers, chills, nausea, vomiting, or diarrhea. 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 10/07/2019 on evaluation today patient presents for follow-up concerning her wound on the left lower extremity. She has been tolerating the dressing changes without complication. With that being said she just got the Santyl 2 days ago. Obviously it has not had enough time to really do what we need to do she would like to obviously give this some time before proceeding with more aggressive debridement or otherwise. 10/21/2019 upon evaluation today patient appears to be doing better with regard to the overall appearance of her wound is not quite as dry I do  believe the Santyl has helped her improve to some degree here. Fortunately there is no signs of active infection at this time. I think we may be able to clean off the wound with a little bit of additional debridement today and likely we can transition away from the Santyl to some kind of collagen type dressing. 10/28/2019 upon evaluation today patient actually appears to be doing fairly well with regard to her wound today. She has been tolerating the dressing changes we did switch to collagen and fortunately that seems to be doing well for her at this point. There does not seem to be any signs of active infection I do feel like the surface of the wound is looking better at this time as well. 11/04/2019 upon evaluation today patient appears to be doing well with regard to her wound. She is making some progress here and the wound does appear to be measuring smaller which is good news. Overall this is good to be somewhat slow but nonetheless I am encouraged by the fact that the collagen does seem to have helped her. 11/11/2019 on evaluation today patient's wound is showing some signs of improvement. The good news is her overall trend does seem to be towards getting better compared to where she was previous. The smaller wounds along the side actually might even be healed but we can watch this 1 more week before I heal this out. Overall I feel like there is improvement here. She is still using the silver collagen. 11/18/2019 upon evaluation today patient appears to be doing about the same with regard to her wound. This is not significantly smaller there still appears to be a lot of inflammation. For that reason I do want to see about actually applying triamcinolone to the actual wound bed to see if this can be of benefit as well. I thought about this for couple weeks but I think it may be a good thing to proceed with at this point. 11/25/2019 on evaluation today patient appears to be doing well with regard to  her lower extremity ulcer. She has been tolerating the dressing changes without complication. Fortunately there is no signs of active infection at this time. No fevers, chills, nausea, vomiting, or diarrhea. 12/02/2019 on evaluation today patient appears to be doing okay with regard to her ulcer on the leg. Fortunately there is no signs of active infection at this time. No fever chills noted she has been tolerating the dressing changes without complication. With that being said overall I am pleased with the fact that there is no signs of infection but at the same time I really feel like she needs more to progress moving week to week and right now were really not seeing that. We have put in for approval for Apligraf which I think could help to move things along. With that being said we  have not heard anything regarding approval at this point. 12/10/2019; patient comes in on my day although have not seen her previously. She has what was a traumatic wound on the left anterior mid tibia. Initially sutured however the area never really healed according to the patient. She has been using Santyl now using Prisma. There is exposed tendon. We have made application for Apligraf apparently this is going through [employer] still has not been approved. She arrives in clinic with erythema around the wound and 4 small circular papules superior to the wound. These are nontender and she is not complaining of any more pain. The exact cause of this is not really clear. The patient has idiopathic peripheral neuropathy which for which she has been extensively evaluated in the past she is not a diabetic she does not have any rheumatologic problems she is aware of. She cannot wear compression because it aggravates her neuropathy 4/15; traumatic wound on the left anterior mid tibia. She arrives today with extensive erythema spreading from the wound medially towards her heel. This is tender swollen and warm compatible  with cellulitis. We are using silver collagen to the wound 4/20; 5-day follow-up. Traumatic wound on the left anterior mid tibia. This is in follow-up for the cellulitis she had on her visit 5 days ago. I gave her empiric cephalexin I did not culture this. She arrives today with the erythema that we marked much improved. She has not been systemically unwell Upon evaluation today patient's wound unfortunately is not doing very well this in fact is somewhat deeper than even last week4/28/2021 which is unfortunate. I am very concerned about the fact that to be honest that the patient does not seem to be doing as good as we would have expected in the past 2 weeks since I last saw her. Fortunately there is no evidence of active infection systemically which is good news. No fevers, chills, nausea, vomiting, or diarrhea. She has been on Keflex. 01/06/2020 upon evaluation today patient unfortunately appears to still be doing somewhat poorly overall in regard to her ulcer. There is definite signs of infection. She has been on Keflex in April but unfortunately this just does not seem to be doing as well as we would like. I do believe that we will get a need to place her on Augmentin in order to try to get this better. She tested positive for Proteus and E. coli on the culture and is allergic to doxycycline. The patient's wound currently is not looking healthy at all to me. 01/27/2020 upon evaluation today patient appears to be doing maybe slightly better with regard to the wound bed at this point. Her infection is definitely improved which is great news. As far as the Apligraf I do not believe the wound surface is quite clean enough yet in order for us to apply the Apligraf. Another potential option would be for a snap VAC to be used under the Curlex and Coban wrap working to try the wrap today. If we can get rid of some of the fluid I think this will help her with healing also think the snap VAC could be  beneficial in this regard. Nonetheless again I am not given up on the Apligraf is a possibility I just do not want a waste the applications by putting it on before the wound surface is ready. Potentially the snap VAC can get this wound to the point where it is ready much more quickly. 02/03/2020 upon evaluation today patient  appears to be doing excellent with regard to her leg ulcer compared to where things have been. The overall quality of the wound bed is significantly improved and very pleased in this regard. We did get approval for the snap VAC which I am wanting to go ahead and see about initiating at this point. We also have approval for the Apligraf when and if the time is appropriate for this. With that being said I really think using the snap VAC to try to get this to fill-in would be appropriate to begin with. The patient is in agreement with that plan. I do believe the compression did help her and she states that that was uncomfortable she was able to manage with that for now. 02/10/2020 upon evaluation today patient appears to be doing better with regard to her original and largest wound. 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 I explained that pyoderma is incompletely understood and obviously very difficult sometimes to treat but often steroid therapy is the mainstay of treatment. I think topical steroid treatment is appropriate at this point. She does have mometasone which has been prescribed previously by her primary care provider and she has that with her so we can use that on some of the satellite areas not on the main wound. The patient is in agreement with that plan. 02/17/2020 upon evaluation today patient appears to be doing well with regard to her wound. She is making good progress with the  snap VAC actually believe that she is in a good position for [place] to be able to continue the snap VAC but actually at the Apligraf to try to speed up the granulation and epithelial growth. The patient is actually in agreement with that she wants to try to get this healed as quickly as possible I completely agree with her. She did fill up the canister again today. 03/02/2020 upon evaluation today patient appears to be doing well at this time with regard to her wound. In fact this is dramatically improved with the use of the snap VAC and the Apligraf even since the last time I saw her 2 weeks ago. Again week she had a nurse visit and changed out the snap VAC last week. Overall I think that she is definitely in a much better place and this wound is definitely making progress measuring smaller and overall I think progressing quite well. Objective Constitutional Well-nourished and well-hydrated in no acute distress. Vitals Time Taken: 8:06 AM, Height: 66 in, Weight: 156 lbs, BMI: 25.2, Temperature: 98.2 F, Pulse: 80 bpm, Respiratory Rate: 16 breaths/min, Blood Pressure: 114/79 mmHg. Respiratory normal breathing without difficulty. Psychiatric this patient is able to make decisions and demonstrates good insight into disease process. Alert and Oriented x 3. pleasant and cooperative. General Notes: Upon inspection patient's wound bed actually showed signs of good granulation at this time there does not appear to be any evidence of active infection which is great news and overall I am very pleased with where things stand. The patient likewise is very happy to see the improvement. We did finally get approval to reinitiate the snap VAC today along with applying the Apligraf as well. Integumentary (Hair, Skin) Wound #2 status is Open. Original cause of wound was Trauma. The wound is located on the  Left,Anterior Lower Leg. The wound measures 4.6cm length x 3.4cm width x 0.3cm depth; 12.284cm^2 area and  3.685cm^3 volume. There is tendon and Fat Layer (Subcutaneous Tissue) Exposed exposed. There is no tunneling or undermining noted. There is a medium amount of serosanguineous drainage noted. The wound margin is epibole. There is large (67-100%) red granulation within the wound bed. There is a small (1-33%) amount of necrotic tissue within the wound bed including Adherent Slough. Wound #4 status is Open. Original cause of wound was Gradually Appeared. The wound is located on the Left,Proximal,Anterior Lower Leg. The wound measures 0.8cm length x 0.5cm width x 0.3cm depth; 0.314cm^2 area and 0.094cm^3 volume. There is Fat Layer (Subcutaneous Tissue) Exposed exposed. There is no tunneling or undermining noted. There is a medium amount of serosanguineous drainage noted. The wound margin is distinct with the outline attached to the wound base. There is small (1-33%) pink, pale granulation within the wound bed. There is a large (67-100%) amount of necrotic tissue within the wound bed including Adherent Slough. Wound #5 status is Open. Original cause of wound was Bump. The wound is located on the Left,Medial Malleolus. The wound measures 0cm length x 0cm width x 0cm depth; 0cm^2 area and 0cm^3 volume. There is no tunneling or undermining noted. There is a none present amount of drainage noted. The wound margin is flat and intact. There is no granulation within the wound bed. There is no necrotic tissue within the wound bed. 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 A skin graft procedure using a bioengineered skin substitute/cellular or tissue based product was performed by Lenda Kelp, PA with the following instrument(s): Blade, Forceps, and Scissors. Apligraf was applied and secured with Steri-Strips. 42 sq cm of product was utilized and 0 sq cm was wasted. Post Application, Adaptic, SNAP VAC was applied. A Time Out was conducted at 09:20, prior to the start of the procedure. The procedure was tolerated well with a pain level of 0 throughout and a pain level of 0 following the procedure. Post procedure Diagnosis Wound #2: Same as Pre-Procedure . Wound #4 Pre-procedure diagnosis of Wound #4 is a Venous Leg Ulcer located on the Left,Proximal,Anterior Lower Leg. A skin graft procedure using a bioengineered skin substitute/cellular or tissue based product was performed by Lenda Kelp, PA with the following instrument(s): Blade, Forceps, and Scissors. Apligraf was applied and secured with Steri-Strips. 2 sq cm of product was utilized and 0 sq cm was wasted. Post Application, gauze was applied. A Time Out was conducted at 09:20, prior to the start of the procedure. The procedure was tolerated well with a pain level of 0 throughout and a pain level of 0 following the procedure. Post procedure Diagnosis Wound #4: Same as Pre-Procedure . Plan Follow-up Appointments: Return Appointment in 2 weeks. - Tues or Thurs for next apligraf Nurse Visit: - 1 week post apligraf and SNAP change Dressing Change Frequency: Wound #2 Left,Anterior Lower Leg: Do not change entire dressing for one week. - DO NOT remove adaptic Wound #4 Left,Proximal,Anterior  Lower Leg: Do not change entire dressing for one week. Skin Barriers/Peri-Wound Care: Moisturizing lotion Other: - mometasone cream to irritated areas on leg with dressing  changes Wound Cleansing: Wound #2 Left,Anterior Lower Leg: May shower with protection. Primary Wound Dressing: Wound #2 Left,Anterior Lower Leg: Skin Substitute Application - apligraf #2 Mepitel or Adaptic - secured with steristrips Wound #4 Left,Proximal,Anterior Lower Leg: Skin Substitute Application - apligraf #1 Mepitel or Adaptic - adaptic secured with steristrips Secondary Dressing: Wound #4 Left,Proximal,Anterior Lower Leg: Dry Gauze Negative Presssure Wound Therapy: Wound #2 Left,Anterior Lower Leg: SNAP Vac to wound continuously at 123mm/hg pressure - cut drape size down to minimum to hold in place, skin prep under drape, Edema Control: Kerlix and Coban - Left Lower Extremity Avoid standing for long periods of time Elevate legs to the level of the heart or above for 30 minutes daily and/or when sitting, a frequency of: - throughout the day Exercise regularly 1. I would recommend currently that we go ahead and continue with the Apligraf applications as long as we are deriving benefit and right now I see excellent benefit at this time she has a lot of new granulation and epithelial growth the tendon is no longer even exposed. 2. I am also can recommend for the time being that we continue with the snap VAC which seems to also be beneficial for the patient. She seems to be doing extremely well in that regard. 3. I did reapply the Apligraf today no sharp debridement was even necessary and overall I feel like the patient is managing quite nicely here. We will see patient back for reevaluation in 1 week here in the clinic. If anything worsens or changes patient will contact our office for additional recommendations. Electronic Signature(s) Signed: 03/02/2020 9:45:31 AM By: Lenda Kelp PA-C Entered By: Lenda Kelp on 03/02/2020 09:45:31 -------------------------------------------------------------------------------- SuperBill Details Patient Name: Date of Service: [name], [name] 03/02/2020 Medical Record Number: [id number] Patient Account Number: [id number] Date of Birth/Sex: Treating RN: Jan 01, 1961 (59 y.o. [name] Standard Primary Care Provider: Docia Chuck, [place] Other Clinician: Referring Provider: Treating Provider/Extender: Richardo Priest, [place] Weeks in Treatment: 22 Diagnosis Coding ICD-10 Codes Code Description I87.2 Venous insufficiency (chronic) (peripheral) L97.822 Non-pressure chronic ulcer of other part of left lower leg with fat layer exposed L03.116 Cellulitis of left lower limb L97.828 Non-pressure chronic ulcer of other part of left lower leg with other specified severity L97.322 Non-pressure chronic ulcer of left ankle with fat layer exposed 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 I73.00 Raynaud's syndrome without gangrene 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, unspecified Facility Procedures CPT4 Code: 73710626 Description: (Facility Use Only) Apligraf 1 SQ CM Modifier: Quantity: 44 CPT4 Code: [id number] Description: 15271 - SKIN SUB GRAFT TRNK/ARM/LEG ICD-10 Diagnosis Description L97.822 Non-pressure chronic ulcer of other part of left lower leg with fat layer expos Modifier: ed Quantity: 1 Physician Procedures : CPT4 Code Description Modifier [id number] 15271 - WC PHYS SKIN SUB GRAFT TRNK/ARM/LEG ICD-10 Diagnosis Description L97.822 Non-pressure chronic ulcer of other part of left lower leg with fat layer exposed Quantity: 1 Electronic Signature(s) Signed: 03/02/2020 9:45:48 AM By: Lenda Kelp PA-C Entered By: Lenda Kelp on 03/02/2020 09:45:47

## 2020-03-02 NOTE — Progress Notes (Signed)
CHANIN, FRUMKIN ([id number]) Visit Report for 03/02/2020 Arrival Information Details Patient Name: Date of Service: LADA, FULBRIGHT 03/02/2020 8:00 A M Medical Record Number: [id number] Patient Account Number: [id number] Date of Birth/Sex: Treating RN: 23-Jul-1961 (59 y.o. [name] Primary Care Dedra Matsuo: [name], [place] Other Clinician: Referring Augustino Savastano: Treating Sohan Potvin/Extender: Dayna Ramus, [place] Weeks in Treatment: 22 Visit Information History Since Last Visit Added or deleted any medications: No Patient Arrived: Ambulatory Any new allergies or adverse reactions: No Arrival Time: 08:06 Had a fall or experienced change in No Accompanied By: alone activities of daily living that may affect Transfer Assistance: None risk of falls: Patient Identification Verified: Yes Signs or symptoms of abuse/neglect since last visito No Secondary Verification Process Completed: Yes Hospitalized since last visit: No Patient Requires Transmission-Based Precautions: No Implantable device outside of the clinic excluding No Patient Has Alerts: Yes cellular tissue based products placed in the center since last visit: Has Dressing in Place as Prescribed: Yes Has Compression in Place as Prescribed: Yes Pain Present Now: No Electronic Signature(s) Signed: 03/02/2020 5:49:00 PM By: Levan Hurst RN, BSN Entered By: Levan Hurst on 03/02/2020 08:06:21 -------------------------------------------------------------------------------- Encounter Discharge Information Details Patient Name: Date of Service: [name] 03/02/2020 8:00 A M Medical Record Number: [id number] Patient Account Number: [id number] Date of Birth/Sex: Treating RN: [birth year] (59 y.o. [name] Primary Care Miriam Kestler: [name], [place] Other Clinician: Referring Ajooni Karam: Treating Masin Shatto/Extender: Dayna Ramus, [place] Weeks in Treatment: 22 Encounter Discharge Information Items Post  Procedure Vitals Discharge Condition: Stable Temperature (F): 98.2 Ambulatory Status: Ambulatory Pulse (bpm): 80 Discharge Destination: Home Respiratory Rate (breaths/min): 16 Transportation: Private Auto Blood Pressure (mmHg): 114/79 Accompanied By: self Schedule Follow-up Appointment: Yes Clinical Summary of Care: Patient Declined Electronic Signature(s) Signed: 03/02/2020 5:31:10 PM By: Carlene Coria RN Entered By: Carlene Coria on 03/02/2020 09:54:15 -------------------------------------------------------------------------------- Lower Extremity Assessment Details Patient Name: Date of Service: ANTONELLA, UPSON 03/02/2020 8:00 A M Medical Record Number: [id number] Patient Account Number: [id number] Date of Birth/Sex: Treating RN: 1961-08-08 (59 y.o. [name] Primary Care Nirvan Laban: [place], [place] Other Clinician: Referring Oniyah Rohe: Treating Josiane Labine/Extender: Dayna Ramus, [place] Weeks in Treatment: 22 Edema Assessment Assessed: [Left: No] [Right: No] Edema: [Left: Ye] [Right: s] Calf Left: Right: Point of Measurement: 29 cm From Medial Instep 32.4 cm cm Ankle Left: Right: Point of Measurement: 8 cm From Medial Instep 20 cm cm Vascular Assessment Pulses: Dorsalis Pedis Palpable: [Left:Yes] Electronic Signature(s) Signed: 03/02/2020 5:49:00 PM By: Levan Hurst RN, BSN Entered By: Levan Hurst on 03/02/2020 08:17:44 -------------------------------------------------------------------------------- [place] Details Patient Name: Date of Service: [name] 03/02/2020 8:00 A M Medical Record Number: [id number] Patient Account Number: [id number] Date of Birth/Sex: Treating RN: 1960/11/12 (59 y.o. [name] Primary Care Tekelia Kareem: [name], [place] Other Clinician: Referring Friedrich Harriott: Treating Georga Stys/Extender: Dayna Ramus, [place] Weeks in Treatment: 22 Active Inactive Venous Leg Ulcer Nursing  Diagnoses: Knowledge deficit related to disease process and management Potential for venous Insuffiency (use before diagnosis confirmed) Goals: Patient will maintain optimal edema control Date Initiated: 09/30/2019 Target Resolution Date: 03/30/2020 Goal Status: Active Patient/caregiver will verbalize understanding of disease process and disease management Date Initiated: 09/30/2019 Date Inactivated: 11/25/2019 Target Resolution Date: 11/25/2019 Goal Status: Met Interventions: Assess peripheral edema status every visit. Compression as ordered Provide education on venous insufficiency Treatment Activities: Therapeutic compression applied : 09/30/2019 Notes: Wound/Skin Impairment Nursing Diagnoses: Impaired tissue integrity Knowledge deficit related to ulceration/compromised skin integrity Goals: Patient/caregiver will verbalize  understanding of skin care regimen Date Initiated: 09/30/2019 Target Resolution Date: 03/30/2020 Goal Status: Active Ulcer/skin breakdown will have a volume reduction of 30% by week 4 Date Initiated: 09/30/2019 Date Inactivated: 10/28/2019 Target Resolution Date: 10/28/2019 Goal Status: Unmet Unmet Reason: other comorbidities Interventions: Assess patient/caregiver ability to obtain necessary supplies Assess patient/caregiver ability to perform ulcer/skin care regimen upon admission and as needed Assess ulceration(s) every visit Provide education on ulcer and skin care Treatment Activities: Skin care regimen initiated : 09/30/2019 Topical wound management initiated : 09/30/2019 Notes: Electronic Signature(s) Signed: 03/02/2020 5:49:12 PM By: Baruch Gouty RN, BSN Entered By: Baruch Gouty on 03/02/2020 08:05:52 -------------------------------------------------------------------------------- Pain Assessment Details Patient Name: Date of Service: [name] 03/02/2020 8:00 A M Medical Record Number: [id number] Patient Account Number:  [id number] Date of Birth/Sex: Treating RN: [birth year] (59 y.o. [name] Primary Care Ayyan Sites: [name], [place] Other Clinician: Referring Yesenia Locurto: Treating Aradhana Gin/Extender: Dayna Ramus, [place] Weeks in Treatment: 22 Active Problems Location of Pain Severity and Description of Pain Patient Has Paino No Site Locations Pain Management and Medication Current Pain Management: Electronic Signature(s) Signed: 03/02/2020 5:49:00 PM By: Levan Hurst RN, BSN Entered By: Levan Hurst on 03/02/2020 08:06:41 -------------------------------------------------------------------------------- Patient/Caregiver Education Details Patient Name: Date of Service: Mccosh, Gregoria B. 6/30/2021andnbsp8:00 [place] Record Number: [id number] Patient Account Number: [id number] Date of Birth/Gender: Treating RN: 10-Feb-1961 (59 y.o. [name] Primary Care Physician: [name], [place] Other Clinician: Referring Physician: Treating Physician/Extender: Dayna Ramus, [place] Weeks in Treatment: 22 Education Assessment Education Provided To: Patient Education Topics Provided Venous: Methods: Explain/Verbal Responses: Reinforcements needed, State content correctly Wound/Skin Impairment: Methods: Explain/Verbal Responses: Reinforcements needed, State content correctly Electronic Signature(s) Signed: 03/02/2020 5:49:12 PM By: Baruch Gouty RN, BSN Entered By: Baruch Gouty on 03/02/2020 08:06:11 -------------------------------------------------------------------------------- Wound Assessment Details Patient Name: Date of Service: [name] 03/02/2020 8:00 A M Medical Record Number: [id number] Patient Account Number: [id number] Date of Birth/Sex: Treating RN: March 01, 1961 (59 y.o. [name] Primary Care Anahy Esh: [name], [place] Other Clinician: Referring Hennessy Bartel: Treating Dwight Adamczak/Extender: Dayna Ramus, [place] Weeks in Treatment:  22 Wound Status Wound Number: 2 Primary Etiology: Venous Leg Ulcer Wound Location: Left, Anterior Lower Leg Wound Status: Open Wounding Event: Trauma Comorbid History: Lymphedema, Raynauds, Neuropathy Date Acquired: 08/06/2019 Weeks Of Treatment: 22 Clustered Wound: No Wound Measurements Length: (cm) 4.6 Width: (cm) 3.4 Depth: (cm) 0.3 Area: (cm) 12.284 Volume: (cm) 3.685 % Reduction in Area: 32.6% % Reduction in Volume: -1% Epithelialization: Small (1-33%) Tunneling: No Undermining: No Wound Description Classification: Full Thickness With Exposed Support Structures Wound Margin: Epibole Exudate Amount: Medium Exudate Type: Serosanguineous Exudate Color: red, brown Foul Odor After Cleansing: No Slough/Fibrino Yes Wound Bed Granulation Amount: Large (67-100%) Exposed Structure Granulation Quality: Red Fascia Exposed: No Necrotic Amount: Small (1-33%) Fat Layer (Subcutaneous Tissue) Exposed: Yes Necrotic Quality: Adherent Slough Tendon Exposed: Yes Muscle Exposed: No Joint Exposed: No Bone Exposed: No Treatment Notes Wound #2 (Left, Anterior Lower Leg) 1. Cleanse With Wound Cleanser Soap and water 3. Primary Dressing Applied Dry Gauze 4. Secondary Dressing Dry Gauze Notes apli graft , adaptic, steri strips and snap vac Electronic Signature(s) Signed: 03/02/2020 5:49:00 PM By: Levan Hurst RN, BSN Entered By: Levan Hurst on 03/02/2020 08:18:41 -------------------------------------------------------------------------------- Wound Assessment Details Patient Name: Date of Service: [name] 03/02/2020 8:00 A M Medical Record Number: [id number] Patient Account Number: [id number] Date of Birth/Sex: Treating RN: Aug 01, 1961 (59 y.o. [name] Primary Care Alyia Lacerte: Lujean Amel Other Clinician: Referring  Khamia Stambaugh: Treating Sotero Brinkmeyer/Extender: Dayna Ramus, [place] Weeks in Treatment: 22 Wound Status Wound Number: 4 Primary  Etiology: Venous Leg Ulcer Wound Location: Left, Proximal, Anterior Lower Leg Wound Status: Open Wounding Event: Gradually Appeared Comorbid History: Lymphedema, Raynauds, Neuropathy Date Acquired: 01/20/2020 Weeks Of Treatment: 5 Clustered Wound: No Wound Measurements Length: (cm) 0.8 Width: (cm) 0.5 Depth: (cm) 0.3 Area: (cm) 0.314 Volume: (cm) 0.094 % Reduction in Area: -33.1% % Reduction in Volume: -32.4% Epithelialization: None Tunneling: No Undermining: No Wound Description Classification: Full Thickness Without Exposed Support Struc Wound Margin: Distinct, outline attached Exudate Amount: Medium Exudate Type: Serosanguineous Exudate Color: red, brown tures Foul Odor After Cleansing: No Slough/Fibrino Yes Wound Bed Granulation Amount: Small (1-33%) Exposed Structure Granulation Quality: Pink, Pale Fascia Exposed: No Necrotic Amount: Large (67-100%) Fat Layer (Subcutaneous Tissue) Exposed: Yes Necrotic Quality: Adherent Slough Tendon Exposed: No Muscle Exposed: No Joint Exposed: No Bone Exposed: No Treatment Notes Wound #4 (Left, Proximal, Anterior Lower Leg) 1. Cleanse With Wound Cleanser Soap and water 3. Primary Dressing Applied Dry Gauze 4. Secondary Dressing Dry Gauze Notes apli graft , adaptic, steri strips and snap vac Electronic Signature(s) Signed: 03/02/2020 5:49:00 PM By: Levan Hurst RN, BSN Entered By: Levan Hurst on 03/02/2020 08:19:09 -------------------------------------------------------------------------------- Wound Assessment Details Patient Name: Date of Service: [name] 03/02/2020 8:00 A M Medical Record Number: [id number] Patient Account Number: [id number] Date of Birth/Sex: Treating RN: 05/29/1961 (59 y.o. [name] Primary Care Jazlyn Tippens: [name], [place] Other Clinician: Referring Betsabe Iglesia: Treating Clova Morlock/Extender: Dayna Ramus, [place] Weeks in Treatment: 22 Wound Status Wound Number:  5 Primary Etiology: Venous Leg Ulcer Wound Location: Left, Medial Malleolus Wound Status: Open Wounding Event: Bump Comorbid History: Lymphedema, Raynauds, Neuropathy Date Acquired: 02/03/2020 Weeks Of Treatment: 4 Clustered Wound: No Wound Measurements Length: (cm) Width: (cm) Depth: (cm) Area: (cm) Volume: (cm) 0 % Reduction in Area: 100% 0 % Reduction in Volume: 100% 0 Epithelialization: Large (67-100%) 0 Tunneling: No 0 Undermining: No Wound Description Classification: Full Thickness Without Exposed Support Structures Wound Margin: Flat and Intact Exudate Amount: None Present Foul Odor After Cleansing: No Slough/Fibrino No Wound Bed Granulation Amount: None Present (0%) Exposed Structure Necrotic Amount: None Present (0%) Fascia Exposed: No Fat Layer (Subcutaneous Tissue) Exposed: No Tendon Exposed: No Muscle Exposed: No Joint Exposed: No Bone Exposed: No Electronic Signature(s) Signed: 03/02/2020 5:49:00 PM By: Levan Hurst RN, BSN Entered By: Levan Hurst on 03/02/2020 08:19:23 -------------------------------------------------------------------------------- [place] Details Patient Name: Date of Service: [name] 03/02/2020 8:00 A M Medical Record Number: [id number] Patient Account Number: [id number] Date of Birth/Sex: Treating RN: 11-09-1960 (59 y.o. [name] Primary Care Emrys Mckamie: [name], [place] Other Clinician: Referring Merrie Epler: Treating Akire Rennert/Extender: Dayna Ramus, [place] Weeks in Treatment: 22 Vital Signs Time Taken: 08:06 Temperature (F): 98.2 Height (in): 66 Pulse (bpm): 80 Weight (lbs): 156 Respiratory Rate (breaths/min): 16 Body Mass Index (BMI): 25.2 Blood Pressure (mmHg): 114/79 Reference Range: 80 - 120 mg / dl Electronic Signature(s) Signed: 03/02/2020 5:49:00 PM By: Levan Hurst RN, BSN Entered By: Levan Hurst on 03/02/2020 08:06:34

## 2020-03-09 ENCOUNTER — Encounter (HOSPITAL_BASED_OUTPATIENT_CLINIC_OR_DEPARTMENT_OTHER): Payer: Worker's Compensation | Attending: Physician Assistant | Admitting: Physician Assistant

## 2020-03-09 DIAGNOSIS — L03116 Cellulitis of left lower limb: Secondary | ICD-10-CM | POA: Diagnosis not present

## 2020-03-09 DIAGNOSIS — L97822 Non-pressure chronic ulcer of other part of left lower leg with fat layer exposed: Secondary | ICD-10-CM | POA: Diagnosis not present

## 2020-03-09 DIAGNOSIS — M3501 Sicca syndrome with keratoconjunctivitis: Secondary | ICD-10-CM | POA: Diagnosis not present

## 2020-03-09 DIAGNOSIS — I872 Venous insufficiency (chronic) (peripheral): Secondary | ICD-10-CM | POA: Diagnosis present

## 2020-03-09 DIAGNOSIS — G609 Hereditary and idiopathic neuropathy, unspecified: Secondary | ICD-10-CM | POA: Diagnosis not present

## 2020-03-09 DIAGNOSIS — L97828 Non-pressure chronic ulcer of other part of left lower leg with other specified severity: Secondary | ICD-10-CM | POA: Diagnosis not present

## 2020-03-09 DIAGNOSIS — I7300 Raynaud's syndrome without gangrene: Secondary | ICD-10-CM | POA: Diagnosis not present

## 2020-03-09 NOTE — Progress Notes (Signed)
LILLIAHNA, SCHUBRING ([id number]) Visit Report for 03/09/2020 [place] Details Patient Name: Date of Service: JAZZLIN, CLEMENTS 03/09/2020 Medical Record Number: [id number] Patient Account Number: [id number] Date of Birth/Sex: Treating RN: 11-02-1960 (59 y.o. 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

## 2020-03-09 NOTE — Progress Notes (Signed)
Cheyenne, Richardson ([id number]) Visit Report for 03/09/2020 Arrival Information Details Patient Name: Date of Service: [name], [name] 03/09/2020 8:00 A M Medical Record Number: [id number] Patient Account Number: [id number] Date of Birth/Sex: Treating RN: Aug 30, 1961 (59 y.o. Wynelle Link Primary Care Asencion Loveday: Docia Chuck, [place] Other Clinician: Referring Sheva Mcdougle: Treating Maryuri Warnke/Extender: Richardo Priest, [place] Weeks in Treatment: 23 Visit Information History Since Last Visit Added or deleted any medications: No Patient Arrived: Ambulatory Any new allergies or adverse reactions: No Arrival Time: 08:21 Had a fall or experienced change in No Accompanied By: self activities of daily living that may affect Transfer Assistance: None risk of falls: Patient Identification Verified: Yes Signs or symptoms of abuse/neglect since last visito No Secondary Verification Process Completed: Yes Hospitalized since last visit: No Patient Requires Transmission-Based Precautions: No Implantable device outside of the clinic excluding No Patient Has Alerts: Yes cellular tissue based products placed in the center since last visit: Has Dressing in Place as Prescribed: Yes Pain Present Now: No Electronic Signature(s) Signed: 03/09/2020 5:25:38 PM By: Zandra Abts RN, BSN Entered By: Zandra Abts on 03/09/2020 08:21:36 -------------------------------------------------------------------------------- Encounter Discharge Information Details Patient Name: Date of Service: Cheyenne Aran B. 03/09/2020 8:00 A M Medical Record Number: [id number] Patient Account Number: [id number] Date of Birth/Sex: Treating RN: Mar 02, 1961 (59 y.o. Wynelle Link Primary Care Darshay Deupree: Docia Chuck, [place] Other Clinician: Referring Ozie Lupe: Treating Brendt Dible/Extender: Richardo Priest, [place] Weeks in Treatment: 40 Encounter Discharge Information Items Discharge Condition: Stable Ambulatory Status:  Ambulatory Discharge Destination: Home Transportation: Private Auto Accompanied By: self Schedule Follow-up Appointment: Yes Clinical Summary of Care: Patient Declined Electronic Signature(s) Signed: 03/09/2020 5:25:38 PM By: Zandra Abts RN, BSN Entered By: Zandra Abts on 03/09/2020 08:26:44 -------------------------------------------------------------------------------- Negative Pressure Wound Therapy Maintenance (NPWT) Details Patient Name: Date of Service: Cheyenne, Richardson 03/09/2020 8:00 A M Medical Record Number: [id number] Patient Account Number: [id number] Date of Birth/Sex: Treating RN: 1961-04-13 (59 y.o. Wynelle Link Primary Care Rio Taber: Docia Chuck, [place] Other Clinician: Referring Jenaro Souder: Treating Kyair Ditommaso/Extender: Richardo Priest, [place] Weeks in Treatment: 23 NPWT Maintenance Performed for: Wound #2 Left, Anterior Lower Leg Additional Injuries Covered: No Performed By: Zandra Abts, RN Type: Other Coverage Size (sq cm): 15.64 Pressure Type: Constant Pressure Setting: 125 mmHG Drain Type: None Primary Contact: Other : Sponge/Dressing Type: Foam, Green Date Initiated: 02/03/2020 Dressing Removed: No Quantity of Sponges/Gauze Removed: 1 Canister Changed: No Canister Exudate Volume: 40 Dressing Reapplied: No Quantity of Sponges/Gauze Inserted: 1 Days On NPWT : 36 Electronic Signature(s) Signed: 03/09/2020 5:25:38 PM By: Zandra Abts RN, BSN Entered By: Zandra Abts on 03/09/2020 08:24:24 -------------------------------------------------------------------------------- Patient/Caregiver Education Details Patient Name: Date of Service: [name], Cheyenne B. 7/7/2021andnbsp8:00 A M Medical Record Number: [id number] Patient Account Number: [id number] Date of Birth/Gender: Treating RN: 09/10/1960 (59 y.o. Wynelle Link Primary Care Physician: Docia Chuck, [place] Other Clinician: Referring Physician: Treating Physician/Extender: Richardo Priest, [place] Weeks in Treatment: 37 Education Assessment Education Provided To: Patient Education Topics Provided Venous: Handouts: Controlling Swelling with Multilayered Compression Wraps Methods: Explain/Verbal Responses: State content correctly Wound/Skin Impairment: Handouts: Caring for Your Ulcer Methods: Explain/Verbal Responses: State content correctly Electronic Signature(s) Signed: 03/09/2020 5:25:38 PM By: Zandra Abts RN, BSN Signed: 03/09/2020 5:25:38 PM By: Zandra Abts RN, BSN Entered By: Zandra Abts on 03/09/2020 08:26:24 -------------------------------------------------------------------------------- Wound Assessment Details Patient Name: Date of Service: [name] 03/09/2020 8:00 A M Medical Record Number: [id number] Patient Account Number: [id number] Date of Birth/Sex: Treating RN: June 03, 1961 (59 y.o. Dorthula Perfect, Emeterio Reeve  Primary Care Marquan Vokes: Docia Chuck, [place] Other Clinician: Referring Candyce Gambino: Treating Khriz Liddy/Extender: Richardo Priest, [place] Weeks in Treatment: 23 Wound Status Wound Number: 2 Primary Etiology: Venous Leg Ulcer Wound Location: Left, Anterior Lower Leg Wound Status: Open Wounding Event: Trauma Comorbid History: Lymphedema, Raynauds, Neuropathy Date Acquired: 08/06/2019 Weeks Of Treatment: 23 Clustered Wound: No Wound Measurements Length: (cm) 4.6 Width: (cm) 3.4 Depth: (cm) 0.3 Area: (cm) 12.284 Volume: (cm) 3.685 % Reduction in Area: 32.6% % Reduction in Volume: -1% Epithelialization: Small (1-33%) Tunneling: No Undermining: No Wound Description Classification: Full Thickness With Exposed Support Structures Wound Margin: Epibole Exudate Amount: Medium Exudate Type: Serosanguineous Exudate Color: red, brown Foul Odor After Cleansing: No Slough/Fibrino Yes Wound Bed Granulation Amount: Large (67-100%) Exposed Structure Granulation Quality: Red Fascia Exposed: No Necrotic Amount: Small (1-33%) Fat  Layer (Subcutaneous Tissue) Exposed: Yes Necrotic Quality: Adherent Slough Tendon Exposed: Yes Muscle Exposed: No Joint Exposed: No Bone Exposed: No Treatment Notes Wound #2 (Left, Anterior Lower Leg) 1. Cleanse With Wound Cleanser 2. Periwound Care Skin Prep 3. Primary Dressing Applied Other primary dressing (specifiy in notes) 4. Secondary Dressing Other secondary dressing (specify in notes) 6. Support Layer Applied Kerlix/Coban Notes adaptic over apligraft to anterior lower leg site (steri-strips replaced and snap vac changed). steri-strip remains intact to anterior proximal site. Electronic Signature(s) Signed: 03/09/2020 5:25:38 PM By: Zandra Abts RN, BSN Entered By: Zandra Abts on 03/09/2020 08:22:01 -------------------------------------------------------------------------------- Wound Assessment Details Patient Name: Date of Service: [name] 03/09/2020 8:00 A M Medical Record Number: [id number] Patient Account Number: [id number] Date of Birth/Sex: Treating RN: June 18, 1961 (59 y.o. Wynelle Link Primary Care Miriam Liles: Docia Chuck, [place] Other Clinician: Referring Yaquelin Langelier: Treating Hamad Whyte/Extender: Richardo Priest, [place] Weeks in Treatment: 23 Wound Status Wound Number: 4 Primary Etiology: Venous Leg Ulcer Wound Location: Left, Proximal, Anterior Lower Leg Wound Status: Open Wounding Event: Gradually Appeared Comorbid History: Lymphedema, Raynauds, Neuropathy Date Acquired: 01/20/2020 Weeks Of Treatment: 6 Clustered Wound: No Wound Measurements Length: (cm) 0.8 Width: (cm) 0.5 Depth: (cm) 0.3 Area: (cm) 0.314 Volume: (cm) 0.094 % Reduction in Area: -33.1% % Reduction in Volume: -32.4% Epithelialization: None Tunneling: No Undermining: No Wound Description Classification: Full Thickness Without Exposed Support Structures Wound Margin: Distinct, outline attached Exudate Amount: Medium Exudate Type: Serosanguineous Exudate Color:  red, brown Foul Odor After Cleansing: No Slough/Fibrino Yes Wound Bed Granulation Amount: Small (1-33%) Exposed Structure Granulation Quality: Pink, Pale Fascia Exposed: No Necrotic Amount: Large (67-100%) Fat Layer (Subcutaneous Tissue) Exposed: Yes Necrotic Quality: Adherent Slough Tendon Exposed: No Muscle Exposed: No Joint Exposed: No Bone Exposed: No Treatment Notes Wound #4 (Left, Proximal, Anterior Lower Leg) 1. Cleanse With Wound Cleanser 2. Periwound Care Skin Prep 3. Primary Dressing Applied Other primary dressing (specifiy in notes) 4. Secondary Dressing Other secondary dressing (specify in notes) Notes adaptic over apligraft to anterior lower leg site (steri-strips replaced and snap vac changed). steri-strip remains intact to anterior proximal site. Electronic Signature(s) Signed: 03/09/2020 5:25:38 PM By: Zandra Abts RN, BSN Entered By: Zandra Abts on 03/09/2020 08:22:20 -------------------------------------------------------------------------------- Vitals Details Patient Name: Date of Service: Cheyenne Aran B. 03/09/2020 8:00 A M Medical Record Number: [id number] Patient Account Number: [id number] Date of Birth/Sex: Treating RN: August 28, 1961 (59 y.o. Wynelle Link Primary Care Estrellita Lasky: Docia Chuck, [place] Other Clinician: Referring Resean Brander: Treating Purvis Sidle/Extender: Richardo Priest, [place] Weeks in Treatment: 23 Vital Signs Time Taken: 08:14 Temperature (F): 98.4 Height (in): 66 Pulse (bpm): 84 Weight (lbs): 156 Respiratory Rate (breaths/min): 18 Body Mass Index (BMI):  25.2 Blood Pressure (mmHg): 121/86 Reference Range: 80 - 120 mg / dl Electronic Signature(s) Signed: 03/09/2020 5:25:38 PM By: Zandra Abts RN, BSN Entered By: Zandra Abts on 03/09/2020 08:21:40

## 2020-03-15 ENCOUNTER — Encounter (HOSPITAL_BASED_OUTPATIENT_CLINIC_OR_DEPARTMENT_OTHER): Payer: Worker's Compensation | Admitting: Internal Medicine

## 2020-03-15 DIAGNOSIS — I872 Venous insufficiency (chronic) (peripheral): Secondary | ICD-10-CM | POA: Diagnosis not present

## 2020-03-15 NOTE — Progress Notes (Addendum)
[name], [name] ([id number]) Visit Report for 03/15/2020 Cellular or Tissue Based Product Details Patient Name: Date of Service: [name], [name] 03/15/2020 8:30 A M Medical Record Number: [id number] Patient Account Number: [id number] Date of Birth/Sex: Treating RN: 1961/05/18 (59 y.o. Freddy Finner Primary Care Provider: Docia Chuck, Dibas Other Clinician: Referring Provider: Treating Provider/Extender: Carola Rhine, Dibas Weeks in Treatment: 23 Cellular or Tissue Based Product Type Wound #2 Left,Anterior Lower Leg Applied to: Performed By: Physician Maxwell Caul., MD Cellular or Tissue Based Product Type: Apligraf Level of Consciousness (Pre-procedure): Awake and Alert Pre-procedure Verification/Time Out Yes - 09:16 Taken: Location: trunk / arms / legs Wound Size (sq cm): 10.92 Product Size (sq cm): 42 Waste Size (sq cm): 0 Amount of Product Applied (sq cm): 42 Instrument Used: Blade, Forceps, Scissors Lot #: [id number] Order #: 3 Expiration Date: 03/19/2020 Fenestrated: Yes Instrument: Blade Reconstituted: Yes Solution Type: normal saline Solution Amount: 10ml Lot #: [id number] Solution Expiration Date: 10/03/2021 Secured: Yes Secured With: Steri-Strips Dressing Applied: Yes Primary Dressing: adaptic Procedural Pain: 0 Post Procedural Pain: 0 Response to Treatment: Procedure was tolerated well Level of Consciousness (Post- Awake and Alert procedure): Post Procedure Diagnosis Same as Pre-procedure Electronic Signature(s) Signed: 03/15/2020 5:16:39 PM By: Baltazar Najjar MD Signed: 03/15/2020 5:57:23 PM By: Yevonne Pax RN Entered By: Yevonne Pax on 03/15/2020 09:42:38 -------------------------------------------------------------------------------- Cellular or Tissue Based Product Details Patient Name: Date of Service: [name] 03/15/2020 8:30 A M Medical Record Number: [id number] Patient Account Number: [id number] Date of Birth/Sex:  Treating RN: 1961-07-05 (59 y.o. Freddy Finner Primary Care Provider: Docia Chuck, Dibas Other Clinician: Referring Provider: Treating Provider/Extender: Carola Rhine, Dibas Weeks in Treatment: 23 Cellular or Tissue Based Product Type Wound #4 Left,Proximal,Anterior Lower Leg Applied to: Performed By: Physician Maxwell Caul., MD Cellular or Tissue Based Product Type: Apligraf Level of Consciousness (Pre-procedure): Awake and Alert Pre-procedure Verification/Time Out Yes - 09:16 Taken: Location: trunk / arms / legs Wound Size (sq cm): 0.3 Product Size (sq cm): 2 Waste Size (sq cm): 0 Amount of Product Applied (sq cm): 2 Instrument Used: Blade, Forceps, Scissors Lot #: [id number] Order #: 3 Expiration Date: 03/19/2020 Fenestrated: Yes Instrument: Blade Reconstituted: Yes Solution Type: normal saline Solution Amount: 10ml Lot #: [id number] Solution Expiration Date: 10/03/2021 Secured: Yes Secured With: Steri-Strips Dressing Applied: Yes Primary Dressing: adaptic Procedural Pain: 0 Post Procedural Pain: 0 Response to Treatment: Procedure was tolerated well Level of Consciousness (Post- Awake and Alert procedure): Post Procedure Diagnosis Same as Pre-procedure Electronic Signature(s) Signed: 03/15/2020 5:16:39 PM By: Baltazar Najjar MD Signed: 03/15/2020 5:57:23 PM By: Yevonne Pax RN Entered By: Yevonne Pax on 03/15/2020 09:43:08 -------------------------------------------------------------------------------- HPI Details Patient Name: Date of Service: Cheyenne Aran B. 03/15/2020 8:30 A M Medical Record Number: [id number] Patient Account Number: [id number] Date of Birth/Sex: Treating RN: 03/09/1961 (59 y.o. F) Primary Care Provider: Docia Chuck, Dibas Other Clinician: Referring Provider: Treating Provider/Extender: Carola Rhine, Dibas Weeks in Treatment: 23 History of Present Illness HPI Description: 09/30/2019 upon evaluation today patient  presents for initial inspection here in our clinic concerning issues that she has been experiencing with a wound on her left anterior lower leg. She unfortunately had a significant laceration which required 19 sutures and she shows me the pictures both before as well as after sutured in a very good job was done in this regard. Unfortunately the skin did not take and necrosis. Subsequently this wound open and she has been having more significant issues with that since. Fortunately  there is no evidence of active infection at this time which is good news. No fever chills noted. She has a history of chronic venous stasis but is no longer able to wear any compression in fact she cannot even wear regular socks due to her neuropathy and the pain that she experiences in her feet if she does. For that reason she tries to elevate her legs as much as she can at nighttime but again she really cannot wear any type of compression. With regard to antibiotic it does not appear that she has been on anything recently. 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 Fortunately there is no evidence of local or systemic infection. No fevers, chills, nausea, vomiting, or diarrhea. 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 10/07/2019 on evaluation today patient presents for follow-up concerning her wound on the left lower extremity. She has been tolerating the dressing changes without complication. With that being said she just got the Santyl 2 days ago. Obviously it has not had enough time to really do what we need to do she would like to obviously give this some time before proceeding with more aggressive debridement or otherwise. 10/21/2019 upon evaluation today patient appears to be doing  better with regard to the overall appearance of her wound is not quite as dry I do believe the Santyl has helped her improve to some degree here. Fortunately there is no signs of active infection at this time. I think we may be able to clean off the wound with a little bit of additional debridement today and likely we can transition away from the Santyl to some kind of collagen type dressing. 10/28/2019 upon evaluation today patient actually appears to be doing fairly well with regard to her wound today. She has been tolerating the dressing changes we did switch to collagen and fortunately that seems to be doing well for her at this point. There does not seem to be any signs of active infection I do feel like the surface of the wound is looking better at this time as well. 11/04/2019 upon evaluation today patient appears to be doing well with regard to her wound. She is making some progress here and the wound does appear to be measuring smaller which is good news. Overall this is good to be somewhat slow but nonetheless I am encouraged by the fact that the collagen does seem to have helped her. 11/11/2019 on evaluation today patient's wound is showing some signs of improvement. The good news is her overall trend does seem to be towards getting better compared to where she was previous. The smaller wounds along the side actually might even be healed but we can watch this 1 more week before I heal this out. Overall I feel like there is improvement here. She is still using the silver collagen. 11/18/2019 upon evaluation today patient appears to be doing about the same with regard to her wound. This is not significantly smaller there still appears to be a lot of inflammation. For that reason I do want to see about actually applying triamcinolone to the actual wound bed to see if this can be of benefit as well. I thought about  this for couple weeks but I think it may be a good thing to proceed with at this  point. 11/25/2019 on evaluation today patient appears to be doing well with regard to her lower extremity ulcer. She has been tolerating the dressing changes without complication. Fortunately there is no signs of active infection at this time. No fevers, chills, nausea, vomiting, or diarrhea. 12/02/2019 on evaluation today patient appears to be doing okay with regard to her ulcer on the leg. Fortunately there is no signs of active infection at this time. No fever chills noted she has been tolerating the dressing changes without complication. With that being said overall I am pleased with the fact that there is no signs of infection but at the same time I really feel like she needs more to progress moving week to week and right now were really not seeing that. We have put in for approval for Apligraf which I think could help to move things along. With that being said we have not heard anything regarding approval at this point. 12/10/2019; patient comes in on my day although have not seen her previously. She has what was a traumatic wound on the left anterior mid tibia. Initially sutured however the area never really healed according to the patient. She has been using Santyl now using Prisma. There is exposed tendon. We have made application for Apligraf apparently this is going through [employer] still has not been approved. She arrives in clinic with erythema around the wound and 4 small circular papules superior to the wound. These are nontender and she is not complaining of any more pain. The exact cause of this is not really clear. The patient has idiopathic peripheral neuropathy which for which she has been extensively evaluated in the past she is not a diabetic she does not have any rheumatologic problems she is aware of. She cannot wear compression because it aggravates her neuropathy 4/15; traumatic wound on the left anterior mid tibia. She arrives today with extensive erythema spreading  from the wound medially towards her heel. This is tender swollen and warm compatible with cellulitis. We are using silver collagen to the wound 4/20; 5-day follow-up. Traumatic wound on the left anterior mid tibia. This is in follow-up for the cellulitis she had on her visit 5 days ago. I gave her empiric cephalexin I did not culture this. She arrives today with the erythema that we marked much improved. She has not been systemically unwell Upon evaluation today patient's wound unfortunately is not doing very well this in fact is somewhat deeper than even last week4/28/2021 which is unfortunate. I am very concerned about the fact that to be honest that the patient does not seem to be doing as good as we would have expected in the past 2 weeks since I last saw her. Fortunately there is no evidence of active infection systemically which is good news. No fevers, chills, nausea, vomiting, or diarrhea. She has been on Keflex. 01/06/2020 upon evaluation today patient unfortunately appears to still be doing somewhat poorly overall in regard to her ulcer. There is definite signs of infection. She has been on Keflex in April but unfortunately this just does not seem to be doing as well as we would like. I do believe that we will get a need to place her on Augmentin in order to try to get this better. She tested positive for Proteus and E. coli on the culture and is allergic to doxycycline. The patient's wound currently  is not looking healthy at all to me. 01/27/2020 upon evaluation today patient appears to be doing maybe slightly better with regard to the wound bed at this point. Her infection is definitely improved which is great news. As far as the Apligraf I do not believe the wound surface is quite clean enough yet in order for [place] to apply the Apligraf. Another potential option would be for a snap VAC to be used under the Curlex and Coban wrap working to try the wrap today. If we can get rid of some of the  fluid I think this will help her with healing also think the snap VAC could be beneficial in this regard. Nonetheless again I am not given up on the Apligraf is a possibility I just do not want a waste the applications by putting it on before the wound surface is ready. Potentially the snap VAC can get this wound to the point where it is ready much more quickly. 02/03/2020 upon evaluation today patient appears to be doing excellent with regard to her leg ulcer compared to where things have been. The overall quality of the wound bed is significantly improved and very pleased in this regard. We did get approval for the snap VAC which I am wanting to go ahead and see about initiating at this point. We also have approval for the Apligraf when and if the time is appropriate for this. With that being said I really think using the snap VAC to try to get this to fill-in would be appropriate to begin with. The patient is in agreement with that plan. I do believe the compression did help her and she states that that was uncomfortable she was able to manage with that for now. 02/10/2020 upon evaluation today patient appears to be doing better with regard to her original and largest wound. 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 I explained that pyoderma is incompletely understood and obviously very difficult sometimes to treat but often steroid therapy is the mainstay of treatment. I think topical steroid treatment is appropriate at this point. She does have mometasone which has been prescribed previously by her primary care provider and she has that with her so we can use that on some of the satellite areas not on the main wound. The patient is in agreement with that plan. 02/17/2020 upon evaluation today patient appears  to be doing well with regard to her wound. She is making good progress with the snap VAC actually believe that she is in a good position for [place] to be able to continue the snap VAC but actually at the Apligraf to try to speed up the granulation and epithelial growth. The patient is actually in agreement with that she wants to try to get this healed as quickly as possible I completely agree with her. She did fill up the canister again today. 03/02/2020 upon evaluation today patient appears to be doing well at this time with regard to her wound. In fact this is dramatically improved with the use of the snap VAC and the Apligraf even since the last time I saw her 2 weeks ago. Again week she had a nurse visit and changed out the snap VAC last week. Overall I think that she is definitely in a  much better place and this wound is definitely making progress measuring smaller and overall I think progressing quite well. 7/13; wound surface looks very healthy. She has a small superior satellite lesion as well. Apligraf applied with a snap VAC. Electronic Signature(s) Signed: 03/15/2020 5:16:39 PM By: Baltazar Najjar MD Entered By: Baltazar Najjar on 03/15/2020 09:42:52 -------------------------------------------------------------------------------- Physical Exam Details Patient Name: Date of Service: [name] 03/15/2020 8:30 A M Medical Record Number: [id number] Patient Account Number: [id number] Date of Birth/Sex: Treating RN: 12/04/1960 (59 y.o. F) Primary Care Provider: Docia Chuck, Dibas Other Clinician: Referring Provider: Treating Provider/Extender: Carola Rhine, Dibas Weeks in Treatment: 23 Constitutional Sitting or standing Blood Pressure is within target range for patient.. Pulse regular and within target range for patient.Marland Kitchen Respirations regular, non-labored and within target range.. Temperature is normal and within the target range for the patient.Marland Kitchen Appears in no  distress. Notes Wound exam; the major wound looks healthy. Still a fair amount of drainage. Not sure about the tiny satellite area in terms of improvement. Apligraf applied to both areas Electronic Signature(s) Signed: 03/15/2020 5:16:39 PM By: Baltazar Najjar MD Entered By: Baltazar Najjar on 03/15/2020 09:43:55 -------------------------------------------------------------------------------- Physician Orders Details Patient Name: Date of Service: Cheyenne Aran B. 03/15/2020 8:30 A M Medical Record Number: [id number] Patient Account Number: [id number] Date of Birth/Sex: Treating RN: 07/03/1961 (59 y.o. 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 - wed for next apligraf Return A Nurse Visit: - 1 week post apligraf and SNAP change Dressing Change Frequency Wound #2 Left,Anterior Lower Leg Do not change entire dressing for one week. - DO NOT remove adaptic Wound #4 Left,Proximal,Anterior Lower Leg Do not change entire dressing for one week. Skin Barriers/Peri-Wound Care Moisturizing lotion Other: - mometasone cream to irritated areas on leg with dressing changes Wound Cleansing Wound #2 Left,Anterior Lower Leg May shower with protection. Primary  Wound Dressing Wound #2 Left,Anterior Lower Leg pplication - apligraf #3 Skin Substitute A daptic - secured with steristrips Mepitel or A Wound #4 Left,Proximal,Anterior Lower Leg pplication - apligraf #1 Skin Substitute A daptic - adaptic secured with steristrips Mepitel or A Secondary Dressing Wound #4 Left,Proximal,Anterior Lower Leg Dry Gauze Negative Presssure Wound Therapy Wound #2 Left,Anterior Lower Leg SNAP Vac to wound continuously at 167mm/hg pressure - cut drape size down to minimum to hold in place, skin prep under drape, Edema Control Kerlix and Coban - Left Lower Extremity Avoid standing for long periods of time Elevate legs to the level of the heart or above for 30 minutes daily and/or when sitting, a frequency of: - throughout the day Exercise regularly Electronic Signature(s) Signed: 03/15/2020 5:16:39 PM By: Baltazar Najjar MD Signed: 03/15/2020 5:57:23 PM By: Yevonne Pax RN Entered By: Yevonne Pax on 03/15/2020 09:16:02 -------------------------------------------------------------------------------- Problem List Details Patient Name: Date of Service: Cheyenne Aran B. 03/15/2020 8:30 A M Medical Record Number: [id number] Patient Account Number: [id number] Date of Birth/Sex: Treating RN: Jul 22, 1961 (  59 y.o. Freddy Finner Primary Care Provider: Mancos, Dibas Other Clinician: Referring Provider: Treating Provider/Extender: Carola Rhine, Dibas Weeks in Treatment: 23 Active Problems ICD-10 Encounter Code Description Active Date MDM Diagnosis I87.2 Venous insufficiency (chronic) (peripheral) 09/30/2019 No Yes L97.822 Non-pressure chronic ulcer of other part of left lower leg with fat layer exposed 09/30/2019 No Yes L03.116 Cellulitis of left lower limb 12/17/2019 No Yes L97.828 Non-pressure chronic ulcer of other part of left lower leg with other specified 09/30/2019 No Yes severity L97.322 Non-pressure chronic ulcer of left ankle with fat layer  exposed 02/03/2020 No Yes 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 09/30/2019 No Yes I73.00 Raynaud's syndrome without gangrene 09/30/2019 No Yes 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 F) Primary Care Provider: Docia Chuck, Dibas Other Clinician: Referring Provider: Treating Provider/Extender: Carola Rhine, Dibas Weeks in Treatment: 23 Subjective History of Present Illness (HPI) 09/30/2019 upon evaluation today patient presents for initial inspection here in our clinic concerning issues that she has been experiencing with a wound on her left anterior lower leg. She unfortunately had a significant laceration which required 19 sutures and she shows me the pictures both before as well as after sutured in a very good job was done in this regard. Unfortunately the skin did not take and necrosis. Subsequently this wound open and she has been having more significant issues with that since. Fortunately there is no evidence of active infection at this time which is good news. No fever chills noted. She has a history of chronic venous stasis but is no longer able to wear any compression in fact she cannot even wear regular socks due to her neuropathy and the pain that she experiences in her feet if she does. For that reason she tries to elevate her legs as much as she can at nighttime but again she really cannot wear any type of compression. With regard to antibiotic it does not appear that she has been on  anything recently. 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 Fortunately there is no evidence of local or systemic infection. No fevers, chills, nausea, vomiting, or diarrhea. 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 10/07/2019 on evaluation today patient presents for follow-up concerning her wound on the left lower extremity. She has been tolerating the dressing changes without complication. With that being said she just got the Santyl 2 days ago. Obviously it has not had enough time to really do what we need to do she would like to obviously give this some time before proceeding with more aggressive debridement or otherwise. 10/21/2019 upon evaluation today patient appears to be doing better with regard to the overall appearance of her wound is not quite as dry I do believe the Santyl has helped her improve to some degree here. Fortunately there is no signs of active infection at this time. I think we may be  able to clean off the wound with a little bit of additional debridement today and likely we can transition away from the Santyl to some kind of collagen type dressing. 10/28/2019 upon evaluation today patient actually appears to be doing fairly well with regard to her wound today. She has been tolerating the dressing changes we did switch to collagen and fortunately that seems to be doing well for her at this point. There does not seem to be any signs of active infection I do feel like the surface of the wound is looking better at this time as well. 11/04/2019 upon evaluation today patient appears to be doing well with regard to her wound. She is making some progress here and the wound does appear to be measuring smaller which is good news. Overall this is good  to be somewhat slow but nonetheless I am encouraged by the fact that the collagen does seem to have helped her. 11/11/2019 on evaluation today patient's wound is showing some signs of improvement. The good news is her overall trend does seem to be towards getting better compared to where she was previous. The smaller wounds along the side actually might even be healed but we can watch this 1 more week before I heal this out. Overall I feel like there is improvement here. She is still using the silver collagen. 11/18/2019 upon evaluation today patient appears to be doing about the same with regard to her wound. This is not significantly smaller there still appears to be a lot of inflammation. For that reason I do want to see about actually applying triamcinolone to the actual wound bed to see if this can be of benefit as well. I thought about this for couple weeks but I think it may be a good thing to proceed with at this point. 11/25/2019 on evaluation today patient appears to be doing well with regard to her lower extremity ulcer. She has been tolerating the dressing changes without complication. Fortunately there is no signs of active infection at this time. No fevers, chills, nausea, vomiting, or diarrhea. 12/02/2019 on evaluation today patient appears to be doing okay with regard to her ulcer on the leg. Fortunately there is no signs of active infection at this time. No fever chills noted she has been tolerating the dressing changes without complication. With that being said overall I am pleased with the fact that there is no signs of infection but at the same time I really feel like she needs more to progress moving week to week and right now were really not seeing that. We have put in for approval for Apligraf which I think could help to move things along. With that being said we have not heard anything regarding approval at this point. 12/10/2019; patient comes in on my day although have not seen her  previously. She has what was a traumatic wound on the left anterior mid tibia. Initially sutured however the area never really healed according to the patient. She has been using Santyl now using Prisma. There is exposed tendon. We have made application for Apligraf apparently this is going through [employer] still has not been approved. She arrives in clinic with erythema around the wound and 4 small circular papules superior to the wound. These are nontender and she is not complaining of any more pain. The exact cause of this is not really clear. The patient has idiopathic peripheral neuropathy which for which she has been extensively evaluated in the past she is  not a diabetic she does not have any rheumatologic problems she is aware of. She cannot wear compression because it aggravates her neuropathy 4/15; traumatic wound on the left anterior mid tibia. She arrives today with extensive erythema spreading from the wound medially towards her heel. This is tender swollen and warm compatible with cellulitis. We are using silver collagen to the wound 4/20; 5-day follow-up. Traumatic wound on the left anterior mid tibia. This is in follow-up for the cellulitis she had on her visit 5 days ago. I gave her empiric cephalexin I did not culture this. She arrives today with the erythema that we marked much improved. She has not been systemically unwell Upon evaluation today patient's wound unfortunately is not doing very well this in fact is somewhat deeper than even last week4/28/2021 which is unfortunate. I am very concerned about the fact that to be honest that the patient does not seem to be doing as good as we would have expected in the past 2 weeks since I last saw her. Fortunately there is no evidence of active infection systemically which is good news. No fevers, chills, nausea, vomiting, or diarrhea. She has been on Keflex. 01/06/2020 upon evaluation today patient unfortunately appears to  still be doing somewhat poorly overall in regard to her ulcer. There is definite signs of infection. She has been on Keflex in April but unfortunately this just does not seem to be doing as well as we would like. I do believe that we will get a need to place her on Augmentin in order to try to get this better. She tested positive for Proteus and E. coli on the culture and is allergic to doxycycline. The patient's wound currently is not looking healthy at all to me. 01/27/2020 upon evaluation today patient appears to be doing maybe slightly better with regard to the wound bed at this point. Her infection is definitely improved which is great news. As far as the Apligraf I do not believe the wound surface is quite clean enough yet in order for [place] to apply the Apligraf. Another potential option would be for a snap VAC to be used under the Curlex and Coban wrap working to try the wrap today. If we can get rid of some of the fluid I think this will help her with healing also think the snap VAC could be beneficial in this regard. Nonetheless again I am not given up on the Apligraf is a possibility I just do not want a waste the applications by putting it on before the wound surface is ready. Potentially the snap VAC can get this wound to the point where it is ready much more quickly. 02/03/2020 upon evaluation today patient appears to be doing excellent with regard to her leg ulcer compared to where things have been. The overall quality of the wound bed is significantly improved and very pleased in this regard. We did get approval for the snap VAC which I am wanting to go ahead and see about initiating at this point. We also have approval for the Apligraf when and if the time is appropriate for this. With that being said I really think using the snap VAC to try to get this to fill-in would be appropriate to begin with. The patient is in agreement with that plan. I do believe the compression did help her and she  states that that was uncomfortable she was able to manage with that for now. 02/10/2020 upon evaluation today patient appears to be  doing better with regard to her original and largest wound. 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 I explained that pyoderma is incompletely understood and obviously very difficult sometimes to treat but often steroid therapy is the mainstay of treatment. I think topical steroid treatment is appropriate at this point. She does have mometasone which has been prescribed previously by her primary care provider and she has that with her so we can use that on some of the satellite areas not on the main wound. The patient is in agreement with that plan. 02/17/2020 upon evaluation today patient appears to be doing well with regard to her wound. She is making good progress with the snap VAC actually believe that she is in a good position for [place] to be able to continue the snap VAC but actually at the Apligraf to try to speed up the granulation and epithelial growth. The patient is actually in agreement with that she wants to try to get this healed as quickly as possible I completely agree with her. She did fill up the canister again today. 03/02/2020 upon evaluation today patient appears to be doing well at this time with regard to her wound. In fact this is dramatically improved with the use of the snap VAC and the Apligraf even since the last time I saw her 2 weeks ago. Again week she had a nurse visit and changed out the snap VAC last week. Overall I think that she is definitely in a much better place and this wound is definitely making progress measuring smaller and overall I think progressing quite well. 7/13; wound surface looks very healthy. She has a small superior satellite lesion  as well. Apligraf applied with a snap VAC. Objective Constitutional Sitting or standing Blood Pressure is within target range for patient.. Pulse regular and within target range for patient.Marland Kitchen Respirations regular, non-labored and within target range.. Temperature is normal and within the target range for the patient.Marland Kitchen Appears in no distress. Vitals Time Taken: 8:35 AM, Height: 66 in, Weight: 156 lbs, BMI: 25.2, Temperature: 97.7 F, Pulse: 81 bpm, Respiratory Rate: 18 breaths/min, Blood Pressure: 126/85 mmHg. General Notes: Wound exam; the major wound looks healthy. Still a fair amount of drainage. Not sure about the tiny satellite area in terms of improvement. Apligraf applied to both areas Integumentary (Hair, Skin) Wound #2 status is Open. Original cause of wound was Trauma. The wound is located on the Left,Anterior Lower Leg. The wound measures 4.2cm length x 2.6cm width x 0.3cm depth; 8.577cm^2 area and 2.573cm^3 volume. There is Fat Layer (Subcutaneous Tissue) Exposed exposed. There is no tunneling or undermining noted. There is a medium amount of serosanguineous drainage noted. The wound margin is epibole. There is large (67-100%) red granulation within the wound bed. There is a small (1-33%) amount of necrotic tissue within the wound bed including Adherent Slough. Wound #4 status is Open. Original cause of wound was Gradually Appeared. The wound is located on the Left,Proximal,Anterior Lower Leg. The wound measures 0.6cm length x 0.5cm width x 0.2cm depth; 0.236cm^2 area and 0.047cm^3 volume. There is Fat Layer (Subcutaneous Tissue) Exposed exposed. There is no tunneling or undermining noted. There is a medium amount of serosanguineous drainage noted. The wound margin is distinct with the outline attached to the  wound base. There is small (1-33%) pink, pale granulation within the wound bed. There is a large (67-100%) amount of necrotic tissue within the wound bed including Adherent  Slough. 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 A skin graft procedure using a bioengineered skin substitute/cellular or tissue based product was performed by Maxwell Caul., MD with the following instrument(s): Blade, Forceps, and Scissors. Apligraf was applied and secured with Steri-Strips. 42 sq cm of product was utilized and 0 sq cm was wasted. Post Application, adaptic was applied. A Time Out was conducted at 09:16, prior to the start of the procedure. The procedure was tolerated well with a pain level of 0 throughout and a pain level of 0 following the procedure. Post procedure Diagnosis Wound #2: Same as Pre-Procedure . Wound #4 Pre-procedure diagnosis of Wound #4 is a Venous Leg Ulcer located on the Left,Proximal,Anterior Lower Leg. A skin graft procedure using a bioengineered skin substitute/cellular or tissue based product was performed by Maxwell Caul., MD with the following instrument(s): Blade, Forceps, and Scissors. Apligraf was applied and secured with Steri-Strips. 2 sq cm of product was utilized and 0 sq cm was wasted. Post Application, adaptic was applied. A Time Out was conducted at 09:16, prior to the start of the procedure. The procedure was tolerated well with a pain level of 0 throughout and a pain level of 0 following the procedure. Post procedure Diagnosis Wound #4: Same as Pre-Procedure . Plan Follow-up  Appointments: Return Appointment in 2 weeks. - wed for next apligraf Nurse Visit: - 1 week post apligraf and SNAP change Dressing Change Frequency: Wound #2 Left,Anterior Lower Leg: Do not change entire dressing for one week. - DO NOT remove adaptic Wound #4 Left,Proximal,Anterior Lower Leg: Do not change entire dressing for one week. Skin Barriers/Peri-Wound Care: Moisturizing lotion Other: - mometasone cream to irritated areas on leg with dressing changes Wound Cleansing: Wound #2 Left,Anterior Lower Leg: May shower with protection. Primary Wound Dressing: Wound #2 Left,Anterior Lower Leg: Skin Substitute Application - apligraf #3 Mepitel or Adaptic - secured with steristrips Wound #4 Left,Proximal,Anterior Lower Leg: Skin Substitute Application - apligraf #1 Mepitel or Adaptic - adaptic secured with steristrips Secondary Dressing: Wound #4 Left,Proximal,Anterior Lower Leg: Dry Gauze Negative Presssure Wound Therapy: Wound #2 Left,Anterior Lower Leg: SNAP Vac to wound continuously at 184mm/hg pressure - cut drape size down to minimum to hold in place, skin prep under drape, Edema Control: Kerlix and Coban - Left Lower Extremity Avoid standing for long periods of time Elevate legs to the level of the heart or above for 30 minutes daily and/or when sitting, a frequency of: - throughout the day Exercise regularly 1. Apligraf applied to both areas 2. Snap VAC reapplied by staff Electronic Signature(s) Signed: 03/15/2020 5:16:39 PM By: Baltazar Najjar MD Entered By: Baltazar Najjar on 03/15/2020 09:44:34 -------------------------------------------------------------------------------- SuperBill Details Patient Name: Date of Service: [name] 03/15/2020 Medical Record Number: [id number] Patient Account Number: [id number] Date of Birth/Sex: Treating RN: September 06, 1960 (59 y.o. Freddy Finner Primary Care Provider: [place], [place] Other  Clinician: Referring  Provider: Treating Provider/Extender: Carola Rhine, [place] Weeks in Treatment: 23 Diagnosis Coding ICD-10 Codes Code Description I87.2 Venous insufficiency (chronic) (peripheral) L97.822 Non-pressure chronic ulcer of other part of left lower leg with fat layer exposed L03.116 Cellulitis of left lower limb L97.828 Non-pressure chronic ulcer of other part of left lower leg with other specified severity L97.322 Non-pressure chronic ulcer of left ankle with fat layer exposed 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 I73.00 Raynaud's syndrome without gangrene 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, unspecified Facility Procedures CPT4 Code: [id number] Description: (Facility Use Only) Apligraf 1 SQ CM Modifier: Quantity: 44 CPT4 Code: 64680321 Description: 15271 - SKIN SUB GRAFT TRNK/ARM/LEG ICD-10 Diagnosis Description L97.822 Non-pressure chronic ulcer of other part of left lower leg with fat layer expos Modifier: ed Quantity: 1 Physician Procedures : CPT4 Code Description Modifier [id number] 15271 - WC PHYS SKIN SUB GRAFT TRNK/ARM/LEG ICD-10 Diagnosis Description L97.822 Non-pressure chronic ulcer of other part of left lower leg with fat layer exposed Quantity: 1 Electronic Signature(s) Signed: 03/15/2020 5:16:39 PM By: Baltazar Najjar MD Signed: 03/15/2020 5:57:23 PM By: Yevonne Pax RN Entered By: Yevonne Pax on 03/15/2020 11:14:33

## 2020-03-21 NOTE — Progress Notes (Signed)
[name], [name] ([id number]) Visit Report for 03/15/2020 Arrival Information Details Patient Name: Date of Service: [name], [name] 03/15/2020 8:30 A M Medical Record Number: [id number] Patient Account Number: [id number] Date of Birth/Sex: Treating RN: 07/14/1961 (59 y.o. F) Primary Care Jerelle Virden: [name], [place] Other Clinician: Referring Helem Reesor: Treating Davanna He/Extender: Otis Brace, [place] Weeks in Treatment: 23 Visit Information History Since Last Visit Added or deleted any medications: No Patient Arrived: Ambulatory Any new allergies or adverse reactions: No Arrival Time: 08:34 Had a fall or experienced change in No Accompanied By: self activities of daily living that may affect Transfer Assistance: None risk of falls: Patient Identification Verified: Yes Signs or symptoms of abuse/neglect since last visito No Secondary Verification Process Completed: Yes Hospitalized since last visit: No Patient Requires Transmission-Based Precautions: No Implantable device outside of the clinic excluding No Patient Has Alerts: Yes cellular tissue based products placed in the center since last visit: Has Dressing in Place as Prescribed: Yes Pain Present Now: Yes Electronic Signature(s) Signed: 03/17/2020 10:46:54 AM By: Sandre Kitty Entered By: Sandre Kitty on 03/15/2020 08:35:18 -------------------------------------------------------------------------------- Encounter Discharge Information Details Patient Name: Date of Service: Cheyenne Mccallum B. 03/15/2020 8:30 A M Medical Record Number: [id number] Patient Account Number: [id number] Date of Birth/Sex: Treating RN: [birth year] (59 y.o. [name] Primary Care Tait Balistreri: [name], [place] Other Clinician: Referring Dorathea Faerber: Treating Rebekka Lobello/Extender: Otis Brace, [place] Weeks in Treatment: 23 Encounter Discharge Information Items Post Procedure Vitals Discharge Condition: Stable Temperature (F):  97.7 Ambulatory Status: Ambulatory Pulse (bpm): 81 Discharge Destination: Home Respiratory Rate (breaths/min): 18 Transportation: Private Auto Blood Pressure (mmHg): 126/85 Accompanied By: alone Schedule Follow-up Appointment: Yes Clinical Summary of Care: Patient Declined Electronic Signature(s) Signed: 03/21/2020 4:12:59 PM By: Levan Hurst RN, BSN Entered By: Levan Hurst on 03/15/2020 09:54:10 -------------------------------------------------------------------------------- Lower Extremity Assessment Details Patient Name: Date of Service: [name] 03/15/2020 8:30 A M Medical Record Number: [id number] Patient Account Number: [id number] Date of Birth/Sex: Treating RN: 1961-07-17 (59 y.o. [name] Primary Care Latausha Flamm: [name], [place] Other Clinician: Referring Selmer Adduci: Treating Kahil Agner/Extender: Otis Brace, [place] Weeks in Treatment: 23 Edema Assessment Assessed: [Left: No] [Right: No] Edema: [Left: Ye] [Right: s] Calf Left: Right: Point of Measurement: 29 cm From Medial Instep 32.4 cm cm Ankle Left: Right: Point of Measurement: 8 cm From Medial Instep 20 cm cm Vascular Assessment Pulses: Dorsalis Pedis Palpable: [Left:Yes] Electronic Signature(s) Signed: 03/21/2020 4:12:59 PM By: Levan Hurst RN, BSN Entered By: Levan Hurst on 03/15/2020 08:53:43 -------------------------------------------------------------------------------- Multi Wound Chart Details Patient Name: Date of Service: [name] 03/15/2020 8:30 A M Medical Record Number: [id number] Patient Account Number: [id number] Date of Birth/Sex: Treating RN: 03/09/1961 (59 y.o. F) Primary Care Norvil Martensen: [name], [place] Other Clinician: Referring Elvis Laufer: Treating Roshelle Traub/Extender: Otis Brace, [place] Weeks in Treatment: 23 Vital Signs Height(in): 66 Pulse(bpm): 81 Weight(lbs): 156 Blood Pressure(mmHg): 126/85 Body Mass Index(BMI):  25 Temperature(F): 97.7 Respiratory Rate(breaths/min): 18 Photos: [2:No Photos Left, Anterior Lower Leg] [4:No Photos Left, Proximal, Anterior Lower Leg] [N/A:N/A N/A] Wound Location: [2:Trauma] [4:Gradually Appeared] [N/A:N/A] Wounding Event: [2:Venous Leg Ulcer] [4:Venous Leg Ulcer] [N/A:N/A] Primary Etiology: [2:Lymphedema, Raynauds,] [4:Lymphedema, Raynauds,] [N/A:N/A] Comorbid History: [2:Neuropathy 08/06/2019] [4:Neuropathy 01/20/2020] [N/A:N/A] Date Acquired: [2:23] [4:6] [N/A:N/A] Weeks of Treatment: [2:Open] [4:Open] [N/A:N/A] Wound Status: [2:4.2x2.6x0.3] [4:0.6x0.5x0.2] [N/A:N/A] Measurements L x W x D (cm) [2:8.577] [4:0.236] [N/A:N/A] A (cm) : rea [2:2.573] [4:0.047] [N/A:N/A] Volume (cm) : [2:53.00%] [4:0.00%] [N/A:N/A] % Reduction in Area: [2:29.40%] [4:33.80%] [N/A:N/A] % Reduction in Volume: [2:Full Thickness With Exposed Support] [  4:Full Thickness Without Exposed] [N/A:N/A] Classification: [2:Structures Medium] [4:Support Structures Medium] [N/A:N/A] Exudate Amount: [2:Serosanguineous] [4:Serosanguineous] [N/A:N/A] Exudate Type: [2:red, brown] [4:red, brown] [N/A:N/A] Exudate Color: [2:Epibole] [4:Distinct, outline attached] [N/A:N/A] Wound Margin: [2:Large (67-100%)] [4:Small (1-33%)] [N/A:N/A] Granulation Amount: [2:Red] [4:Pink, Pale] [N/A:N/A] Granulation Quality: [2:Small (1-33%)] [4:Large (67-100%)] [N/A:N/A] Necrotic Amount: [2:Fat Layer (Subcutaneous Tissue)] [4:Fat Layer (Subcutaneous Tissue)] [N/A:N/A] Exposed Structures: [2:Exposed: Yes Fascia: No Tendon: No Muscle: No Joint: No Bone: No Small (1-33%)] [4:Exposed: Yes Fascia: No Tendon: No Muscle: No Joint: No Bone: No Small (1-33%)] [N/A:N/A] Epithelialization: [2:Cellular or Tissue Based Product] [4:N/A] [N/A:N/A] Treatment Notes Electronic Signature(s) Signed: 03/15/2020 5:16:39 PM By: Linton Ham MD Entered By: Linton Ham on 03/15/2020  09:39:54 -------------------------------------------------------------------------------- Multi-Disciplinary Care Plan Details Patient Name: Date of Service: Cheyenne Mccallum B. 03/15/2020 8:30 A M Medical Record Number: [id number] Patient Account Number: [id number] Date of Birth/Sex: Treating RN: 05-14-1961 (59 y.o. [name] Primary Care Suprena Travaglini: [name], [place] Other Clinician: Referring Pamila Mendibles: Treating Kelisha Dall/Extender: Otis Brace, [place] Weeks in Treatment: 23 Active Inactive Venous Leg Ulcer Nursing Diagnoses: Knowledge deficit related to disease process and management Potential for venous Insuffiency (use before diagnosis confirmed) Goals: Patient will maintain optimal edema control Date Initiated: 09/30/2019 Target Resolution Date: 03/30/2020 Goal Status: Active Patient/caregiver will verbalize understanding of disease process and disease management Date Initiated: 09/30/2019 Date Inactivated: 11/25/2019 Target Resolution Date: 11/25/2019 Goal Status: Met Interventions: Assess peripheral edema status every visit. Compression as ordered Provide education on venous insufficiency Treatment Activities: Therapeutic compression applied : 09/30/2019 Notes: Wound/Skin Impairment Nursing Diagnoses: Impaired tissue integrity Knowledge deficit related to ulceration/compromised skin integrity Goals: Patient/caregiver will verbalize understanding of skin care regimen Date Initiated: 09/30/2019 Target Resolution Date: 03/30/2020 Goal Status: Active Ulcer/skin breakdown will have a volume reduction of 30% by week 4 Date Initiated: 09/30/2019 Date Inactivated: 10/28/2019 Target Resolution Date: 10/28/2019 Goal Status: Unmet Unmet Reason: other comorbidities Interventions: Assess patient/caregiver ability to obtain necessary supplies Assess patient/caregiver ability to perform ulcer/skin care regimen upon admission and as needed Assess ulceration(s) every  visit Provide education on ulcer and skin care Treatment Activities: Skin care regimen initiated : 09/30/2019 Topical wound management initiated : 09/30/2019 Notes: Electronic Signature(s) Signed: 03/15/2020 5:57:23 PM By: Carlene Coria RN Entered By: Carlene Coria on 03/15/2020 09:07:38 -------------------------------------------------------------------------------- Negative Pressure Wound Therapy Maintenance (NPWT) Details Patient Name: Date of Service: [name], [name] 03/15/2020 8:30 A M Medical Record Number: [id number] Patient Account Number: [id number] Date of Birth/Sex: Treating RN: [birth year] (59 y.o. [name] Primary Care Oswald Pott: [name], Dibas Other Clinician: Referring Azalyn Sliwa: Treating Ermel Verne/Extender: Otis Brace, [place] Weeks in Treatment: 23 NPWT Maintenance Performed for: Wound #2 Left, Anterior Lower Leg Additional Injuries Covered: No Performed By: Carlene Coria, RN Type: Other Coverage Size (sq cm): 10.92 Pressure Type: Constant Pressure Setting: 125 mmHG Drain Type: None Primary Contact: Non-Adherent Sponge/Dressing Type: Foam, Green Date Initiated: 02/03/2020 Dressing Removed: No Quantity of Sponges/Gauze Removed: 1 Canister Changed: No Canister Exudate Volume: 60 Dressing Reapplied: No Quantity of Sponges/Gauze Inserted: 1 Respones T Treatment: o good Days On NPWT : 42 Post Procedure Diagnosis Same as Pre-procedure Electronic Signature(s) Signed: 03/15/2020 5:57:23 PM By: Carlene Coria RN Entered By: Carlene Coria on 03/15/2020 09:40:20 -------------------------------------------------------------------------------- Pain Assessment Details Patient Name: Date of Service: [name], [name] 03/15/2020 8:30 A M Medical Record Number: [id number] Patient Account Number: [id number] Date of Birth/Sex: Treating RN: 1960/09/30 (59 y.o. F) Primary Care Alvon Nygaard: [name], Dibas Other Clinician: Referring Orlena Garmon: Treating  Woodard Perrell/Extender: Otis Brace, [place]ib[place] Weeks in  Treatment: 23 Active Problems Location of Pain Severity and Description of Pain Patient Has Paino Yes Site Locations Rate the pain. Current Pain Level: 3 Pain Management and Medication Current Pain Management: Electronic Signature(s) Signed: 03/17/2020 10:46:54 AM By: Sandre Kitty Entered By: Sandre Kitty on 03/15/2020 08:37:02 -------------------------------------------------------------------------------- Patient/Caregiver Education Details Patient Name: Date of Service: [name], Cheyenne B. 7/13/2021andnbsp8:30 Wing Record Number: [id number] Patient Account Number: [id number] Date of Birth/Gender: Treating RN: Jan 30, 1961 (59 y.o. [name] Primary Care Physician: [name], [place] Other Clinician: Referring Physician: Treating Physician/Extender: Otis Brace, Dib[place] Weeks in Treatment: 23 Education Assessment Education Provided To: Patient Education Topics Provided Wound/Skin Impairment: Methods: Explain/Verbal Responses: State content correctly Electronic Signature(s) Signed: 03/15/2020 5:57:23 PM By: Carlene Coria RN Entered By: Carlene Coria on 03/15/2020 09:07:54 -------------------------------------------------------------------------------- Wound Assessment Details Patient Name: Date of Service: [name], [name] 03/15/2020 8:30 A M Medical Record Number: [id number] Patient Account Number: [id number] Date of Birth/Sex: Treating RN: 09-18-1960 (59 y.o. F) Primary Care Zeva Leber: [name], Dibas Other Clinician: Referring Helem Reesor: Treating Teka Chanda/Extender: Otis Brace, [place] Weeks in Treatment: 23 Wound Status Wound Number: 2 Primary Etiology: Venous Leg Ulcer Wound Location: Left, Anterior Lower Leg Wound Status: Open Wounding Event: Trauma Comorbid History: Lymphedema, Raynauds, Neuropathy Date Acquired: 08/06/2019 Weeks Of Treatment: 23 Clustered Wound:  No Wound Measurements Length: (cm) 4.2 Width: (cm) 2.6 Depth: (cm) 0.3 Area: (cm) 8.577 Volume: (cm) 2.573 % Reduction in Area: 53% % Reduction in Volume: 29.4% Epithelialization: Small (1-33%) Tunneling: No Undermining: No Wound Description Classification: Full Thickness With Exposed Support Structures Wound Margin: Epibole Exudate Amount: Medium Exudate Type: Serosanguineous Exudate Color: red, brown Foul Odor After Cleansing: No Slough/Fibrino Yes Wound Bed Granulation Amount: Large (67-100%) Exposed Structure Granulation Quality: Red Fascia Exposed: No Necrotic Amount: Small (1-33%) Fat Layer (Subcutaneous Tissue) Exposed: Yes Necrotic Quality: Adherent Slough Tendon Exposed: No Muscle Exposed: No Joint Exposed: No Bone Exposed: No Treatment Notes Wound #2 (Left, Anterior Lower Leg) 1. Cleanse With Soap and water 2. Periwound Care Moisturizing lotion 3. Primary Dressing Applied Cellular Based Tissue Product 4. Secondary Dressing Other secondary dressing (specify in notes) 6. Support Layer Applied Kerlix/Coban Notes adaptic over apligraf to anterior lower leg site, SNAP vac over apligraf and [employer]) Signed: 03/21/2020 4:12:59 PM By: Levan Hurst RN, BSN Entered By: Levan Hurst on 03/15/2020 08:54:25 -------------------------------------------------------------------------------- Wound Assessment Details Patient Name: Date of Service: [name] 03/15/2020 8:30 A M Medical Record Number: [id number] Patient Account Number: [id number] Date of Birth/Sex: Treating RN: Dec 03, 1960 (59 y.o. F) Primary Care Ricki Vanhandel: [name], Dibas Other Clinician: Referring Lorren Splawn: Treating Beacher Every/Extender: Otis Brace, [place] Weeks in Treatment: 23 Wound Status Wound Number: 4 Primary Etiology: Venous Leg Ulcer Wound Location: Left, Proximal, Anterior Lower Leg Wound Status: Open Wounding Event: Gradually  Appeared Comorbid History: Lymphedema, Raynauds, Neuropathy Date Acquired: 01/20/2020 Weeks Of Treatment: 6 Clustered Wound: No Photos Photo Uploaded By: Mikeal Hawthorne on 03/15/2020 15:40:05 Wound Measurements Length: (cm) 0.6 Width: (cm) 0.5 Depth: (cm) 0.2 Area: (cm) 0.236 Volume: (cm) 0.047 % Reduction in Area: 0% % Reduction in Volume: 33.8% Epithelialization: Small (1-33%) Tunneling: No Undermining: No Wound Description Classification: Full Thickness Without Exposed Support Structures Wound Margin: Distinct, outline attached Exudate Amount: Medium Exudate Type: Serosanguineous Exudate Color: red, brown Foul Odor After Cleansing: No Slough/Fibrino Yes Wound Bed Granulation Amount: Small (1-33%) Exposed Structure Granulation Quality: Pink, Pale Fascia Exposed: No Necrotic Amount: Large (67-100%) Fat Layer (Subcutaneous Tissue) Exposed: Yes Necrotic Quality: Adherent Slough Tendon Exposed: No  Muscle Exposed: No Joint Exposed: No Bone Exposed: No Treatment Notes Wound #4 (Left, Proximal, Anterior Lower Leg) 1. Cleanse With Soap and water 2. Periwound Care Moisturizing lotion 3. Primary Dressing Applied Cellular Based Tissue Product 4. Secondary Dressing Dry Gauze 6. Support Layer Applied Kerlix/Coban Notes apligraf applied by MD, secure with adaptic and Tax adviser) Signed: 03/21/2020 4:12:59 PM By: Levan Hurst RN, BSN Entered By: Levan Hurst on 03/15/2020 08:54:41 -------------------------------------------------------------------------------- [place] Details Patient Name: Date of Service: Cheyenne Mccallum B. 03/15/2020 8:30 A M Medical Record Number: [id number] Patient Account Number: [id number] Date of Birth/Sex: Treating RN: 03/27/1961 (59 y.o. F) Primary Care Kelise Kuch: [name], Dibas Other Clinician: Referring Malaisha Silliman: Treating Antone Summons/Extender: Otis Brace, Dibas Weeks in Treatment: 23 Vital Signs Time  Taken: 08:35 Temperature (F): 97.7 Height (in): 66 Pulse (bpm): 81 Weight (lbs): 156 Respiratory Rate (breaths/min): 18 Body Mass Index (BMI): 25.2 Blood Pressure (mmHg): 126/85 Reference Range: 80 - 120 mg / dl Electronic Signature(s) Signed: 03/17/2020 10:46:54 AM By: Sandre Kitty Entered By: Sandre Kitty on 03/15/2020 08:36:52

## 2020-03-23 ENCOUNTER — Other Ambulatory Visit: Payer: Self-pay

## 2020-03-23 ENCOUNTER — Encounter (HOSPITAL_BASED_OUTPATIENT_CLINIC_OR_DEPARTMENT_OTHER): Payer: Worker's Compensation | Admitting: Physician Assistant

## 2020-03-23 DIAGNOSIS — I872 Venous insufficiency (chronic) (peripheral): Secondary | ICD-10-CM | POA: Diagnosis not present

## 2020-03-25 NOTE — Progress Notes (Signed)
DEANN, MCLAINE ([id number]) Visit Report for 03/23/2020 Arrival Information Details Patient Name: Date of Service: ANGELEAH, LABRAKE 03/23/2020 8:00 A M Medical Record Number: [id number] Patient Account Number: [id number] Date of Birth/Sex: Treating RN: [birth year] (59 y.o. Freddy Finner Primary Care Varian Innes: Docia Chuck, [place] Other Clinician: Referring Timmi Devora: Treating Cale Decarolis/Extender: Richardo Priest, [place] Weeks in Treatment: 25 Visit Information History Since Last Visit All ordered tests and consults were completed: No Patient Arrived: Ambulatory Added or deleted any medications: No Arrival Time: 08:07 Any new allergies or adverse reactions: No Accompanied By: self Had a fall or experienced change in No Transfer Assistance: None activities of daily living that may affect Patient Identification Verified: Yes risk of falls: Secondary Verification Process Completed: Yes Signs or symptoms of abuse/neglect since last visito No Patient Requires Transmission-Based Precautions: No Hospitalized since last visit: No Patient Has Alerts: Yes Implantable device outside of the clinic excluding No cellular tissue based products placed in the center since last visit: Has Dressing in Place as Prescribed: Yes Pain Present Now: No Electronic Signature(s) Signed: 03/25/2020 5:42:58 PM By: Yevonne Pax RN Entered By: Yevonne Pax on 03/23/2020 08:11:39 -------------------------------------------------------------------------------- Encounter Discharge Information Details Patient Name: Date of Service: [name] 03/23/2020 8:00 A M Medical Record Number: [id number] Patient Account Number: [id number] Date of Birth/Sex: Treating RN: 1960-10-18 (59 y.o. Freddy Finner Primary Care Briany Aye: Docia Chuck, [place] Other Clinician: Referring Shoshanah Dapper: Treating Markham Dumlao/Extender: Richardo Priest, [place] Weeks in Treatment: 25 Encounter Discharge Information Items Discharge  Condition: Stable Ambulatory Status: Ambulatory Discharge Destination: Home Transportation: Private Auto Accompanied By: self Schedule Follow-up Appointment: Yes Clinical Summary of Care: Patient Declined Electronic Signature(s) Signed: 03/25/2020 5:42:58 PM By: Yevonne Pax RN Entered By: Yevonne Pax on 03/23/2020 08:48:53 -------------------------------------------------------------------------------- Negative Pressure Wound Therapy Maintenance (NPWT) Details Patient Name: Date of Service: JENILLE, LASZLO 03/23/2020 8:00 A M Medical Record Number: [id number] Patient Account Number: [id number] Date of Birth/Sex: Treating RN: [birth year] (59 y.o. Freddy Finner Primary Care Bary Limbach: Docia Chuck, [place] Other Clinician: Referring Ava Deguire: Treating Danny Zimny/Extender: Richardo Priest, [place] Weeks in Treatment: 25 NPWT Maintenance Performed for: Wound #2 Left, Anterior Lower Leg Additional Injuries Covered: No Performed By: Yevonne Pax, RN Type: Other Coverage Size (sq cm): 10.92 Pressure Type: Constant Pressure Setting: 125 mmHG Drain Type: None Primary Contact: Other : Sponge/Dressing Type: Foam, Green Date Initiated: 02/03/2020 Dressing Removed: No Quantity of Sponges/Gauze Removed: 1 Canister Changed: No Canister Exudate Volume: 40 Dressing Reapplied: No Quantity of Sponges/Gauze Inserted: 1 Days On NPWT : 50 Electronic Signature(s) Signed: 03/25/2020 5:42:58 PM By: Yevonne Pax RN Entered By: Yevonne Pax on 03/23/2020 08:45:55 -------------------------------------------------------------------------------- Patient/Caregiver Education Details Patient Name: Date of Service: Weaver, Samaria B. 7/21/2021andnbsp8:00 A M Medical Record Number: [id number] Patient Account Number: [id number] Date of Birth/Gender: Treating RN: 1961/05/10 (59 y.o. Freddy Finner Primary Care Physician: Docia Chuck, [place] Other Clinician: Referring Physician: Treating Physician/Extender:  Richardo Priest, [place] Weeks in Treatment: 25 Education Assessment Education Provided To: Patient Education Topics Provided Venous: Methods: Explain/Verbal Responses: State content correctly Electronic Signature(s) Signed: 03/25/2020 5:42:58 PM By: Yevonne Pax RN Entered By: Yevonne Pax on 03/23/2020 08:48:34 -------------------------------------------------------------------------------- Wound Assessment Details Patient Name: Date of Service: [name] 03/23/2020 8:00 A M Medical Record Number: [id number] Patient Account Number: [id number] Date of Birth/Sex: Treating RN: 09-Aug-1961 (59 y.o. Freddy Finner Primary Care Trestan Vahle: Docia Chuck, [place] Other Clinician: Referring Nyeem Stoke: Treating Roger Kettles/Extender: Richardo Priest, [place] Weeks in Treatment: 25 Wound Status Wound Number: 2  Primary Etiology: Venous Leg Ulcer Wound Location: Left, Anterior Lower Leg Wound Status: Open Wounding Event: Trauma Comorbid History: Lymphedema, Raynauds, Neuropathy Date Acquired: 08/06/2019 Weeks Of Treatment: 25 Clustered Wound: No Wound Measurements Length: (cm) 4.2 Width: (cm) 2.6 Depth: (cm) 0.3 Area: (cm) 8.577 Volume: (cm) 2.573 % Reduction in Area: 53% % Reduction in Volume: 29.4% Epithelialization: Small (1-33%) Tunneling: No Undermining: No Wound Description Classification: Full Thickness With Exposed Support Structures Wound Margin: Epibole Exudate Amount: Medium Exudate Type: Serosanguineous Exudate Color: red, brown Foul Odor After Cleansing: No Slough/Fibrino Yes Wound Bed Granulation Amount: Large (67-100%) Exposed Structure Granulation Quality: Red Fascia Exposed: No Necrotic Amount: Small (1-33%) Fat Layer (Subcutaneous Tissue) Exposed: Yes Necrotic Quality: Adherent Slough Tendon Exposed: No Muscle Exposed: No Joint Exposed: No Bone Exposed: No Treatment Notes Wound #2 (Left, Anterior Lower Leg) 1. Cleanse With Soap and  water 2. Periwound Care Skin Prep TCA Cream 3. Primary Dressing Applied Other primary dressing (specifiy in notes) 4. Secondary Dressing Other secondary dressing (specify in notes) Notes adaptic over apligraft to anterior lower leg site (steri-strips replaced and snap vac changed). steri-strip remains intact to anterior proximal site. Electronic Signature(s) Signed: 03/25/2020 5:42:58 PM By: Yevonne Pax RN Entered By: Yevonne Pax on 03/23/2020 08:12:49 -------------------------------------------------------------------------------- Wound Assessment Details Patient Name: Date of Service: DALLAS, SCORSONE 03/23/2020 8:00 A M Medical Record Number: [id number] Patient Account Number: [id number] Date of Birth/Sex: Treating RN: [birth year] (59 y.o. Freddy Finner Primary Care Cassanda Walmer: Docia Chuck, [place] Other Clinician: Referring Clydell Sposito: Treating Abdou Stocks/Extender: Richardo Priest, [place] Weeks in Treatment: 25 Wound Status Wound Number: 4 Primary Etiology: Venous Leg Ulcer Wound Location: Left, Proximal, Anterior Lower Leg Wound Status: Open Wounding Event: Gradually Appeared Comorbid History: Lymphedema, Raynauds, Neuropathy Date Acquired: 01/20/2020 Weeks Of Treatment: 8 Clustered Wound: No Wound Measurements Length: (cm) 0.6 Width: (cm) 0.5 Depth: (cm) 0.2 Area: (cm) 0.236 Volume: (cm) 0.047 % Reduction in Area: 0% % Reduction in Volume: 33.8% Epithelialization: Small (1-33%) Tunneling: No Undermining: No Wound Description Classification: Full Thickness Without Exposed Support Structures Wound Margin: Distinct, outline attached Exudate Amount: Medium Exudate Type: Serosanguineous Exudate Color: red, brown Foul Odor After Cleansing: No Slough/Fibrino Yes Wound Bed Granulation Amount: Small (1-33%) Exposed Structure Granulation Quality: Pink, Pale Fascia Exposed: No Necrotic Amount: Large (67-100%) Fat Layer (Subcutaneous Tissue) Exposed: Yes Necrotic  Quality: Adherent Slough Tendon Exposed: No Muscle Exposed: No Joint Exposed: No Bone Exposed: No Treatment Notes Wound #4 (Left, Proximal, Anterior Lower Leg) 1. Cleanse With Soap and water 2. Periwound Care Skin Prep TCA Cream 3. Primary Dressing Applied Other primary dressing (specifiy in notes) 4. Secondary Dressing Other secondary dressing (specify in notes) Notes adaptic over apligraft to anterior lower leg site (steri-strips replaced and snap vac changed). steri-strip remains intact to anterior proximal site. Electronic Signature(s) Signed: 03/25/2020 5:42:58 PM By: Yevonne Pax RN Entered By: Yevonne Pax on 03/23/2020 08:13:04 -------------------------------------------------------------------------------- Vitals Details Patient Name: Date of Service: [name] 03/23/2020 8:00 A M Medical Record Number: [id number] Patient Account Number: [id number] Date of Birth/Sex: Treating RN: 14-Dec-1960 (59 y.o. Freddy Finner Primary Care Cherry Wittwer: Docia Chuck, [place] Other Clinician: Referring Zelina Jimerson: Treating Thomasina Housley/Extender: Richardo Priest, [place] Weeks in Treatment: 25 Vital Signs Time Taken: 08:11 Temperature (F): 97.9 Height (in): 66 Pulse (bpm): 84 Weight (lbs): 156 Respiratory Rate (breaths/min): 18 Body Mass Index (BMI): 25.2 Blood Pressure (mmHg): 123/82 Reference Range: 80 - 120 mg / dl Electronic Signature(s) Signed: 03/25/2020 5:42:58 PM By: Yevonne Pax RN Entered By:  Yevonne Pax on 03/23/2020 08:12:06

## 2020-03-25 NOTE — Progress Notes (Signed)
[name], [name] ([id number]) Visit Report for 03/23/2020 SuperBill Details Patient Name: Date of Service: [name], [name] 03/23/2020 Medical Record Number: [id number] Patient Account Number: [id number] Date of Birth/Sex: Treating RN: 03/24/1961 (59 y.o. 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

## 2020-03-30 ENCOUNTER — Encounter (HOSPITAL_BASED_OUTPATIENT_CLINIC_OR_DEPARTMENT_OTHER): Payer: Worker's Compensation | Admitting: Physician Assistant

## 2020-03-30 ENCOUNTER — Other Ambulatory Visit: Payer: Self-pay

## 2020-03-30 DIAGNOSIS — I872 Venous insufficiency (chronic) (peripheral): Secondary | ICD-10-CM | POA: Diagnosis not present

## 2020-03-30 NOTE — Progress Notes (Addendum)
[name], [name] ([id number]) Visit Report for 03/30/2020 Chief Complaint Document Details Patient Name: Date of Service: [name], [name] 03/30/2020 8:00 A M Medical Record Number: [id number] Patient Account Number: [id number] Date of Birth/Sex: Treating RN: [birth year] (59 y.o. [name] Standard Primary Care Provider: Docia Chuck, [place] Other Clinician: Referring Provider: Treating Provider/Extender: Richardo Priest, [place] Weeks in Treatment: 26 Information Obtained from: Patient Chief Complaint Left LE Ulcers Electronic Signature(s) Signed: 03/30/2020 8:22:26 AM By: Lenda Kelp PA-C Entered By: Lenda Kelp on 03/30/2020 08:22:26 -------------------------------------------------------------------------------- Cellular or Tissue Based Product Details Patient Name: Date of Service: [name], [name] 03/30/2020 8:00 A M Medical Record Number: [id number] Patient Account Number: [id number] Date of Birth/Sex: Treating RN: December 03, 1960 (59 y.o. [name] Standard Primary Care Provider: Docia Chuck, [place] Other Clinician: Referring Provider: Treating Provider/Extender: Richardo Priest, [place] Weeks in Treatment: 26 Cellular or Tissue Based Product Type Wound #2 Left,Anterior Lower Leg Applied to: Performed By: Physician Lenda Kelp, PA Cellular or Tissue Based Product Type: Apligraf Level of Consciousness (Pre-procedure): Awake and Alert Pre-procedure Verification/Time Out Yes - 08:35 Taken: Location: trunk / arms / legs Wound Size (sq cm): 8.28 Product Size (sq cm): 42 Waste Size (sq cm): 0 Amount of Product Applied (sq cm): 42 Instrument Used: Forceps, Scissors Lot #: [id number] Order #: 4 Expiration Date: 04/06/2020 Fenestrated: Yes Instrument: Blade Reconstituted: Yes Solution Type: saline Solution Amount: 10 ml Lot #: [id number] Solution Expiration Date: 10/03/2021 Secured: Yes Secured With: Steri-Strips Dressing Applied: Yes Primary Dressing:  adaptic, SNAP Procedural Pain: 0 Post Procedural Pain: 0 Response to Treatment: Procedure was tolerated well Level of Consciousness (Post- Awake and Alert procedure): Post Procedure Diagnosis Same as Pre-procedure Electronic Signature(s) Signed: 03/30/2020 6:38:59 PM By: Zenaida Deed RN, BSN Signed: 03/30/2020 6:41:43 PM By: Lenda Kelp PA-C Entered By: Zenaida Deed on 03/30/2020 08:39:08 -------------------------------------------------------------------------------- Cellular or Tissue Based Product Details Patient Name: Date of Service: [name] 03/30/2020 8:00 A M Medical Record Number: [id number] Patient Account Number: [id number] Date of Birth/Sex: Treating RN: 08-28-1961 (59 y.o. [name] Standard Primary Care Provider: Docia Chuck, [place] Other Clinician: Referring Provider: Treating Provider/Extender: Richardo Priest, [place] Weeks in Treatment: 26 Cellular or Tissue Based Product Type Wound #4 Left,Proximal,Anterior Lower Leg Applied to: Performed By: Physician Lenda Kelp, PA Cellular or Tissue Based Product Type: Apligraf Level of Consciousness (Pre-procedure): Awake and Alert Pre-procedure Verification/Time Out Yes - 08:35 Taken: Location: trunk / arms / legs Wound Size (sq cm): 0.63 Product Size (sq cm): 2 Waste Size (sq cm): 0 Amount of Product Applied (sq cm): 2 Instrument Used: Forceps, Scissors Lot #: [id number] Order #: 4 Expiration Date: 04/06/2020 Fenestrated: Yes Instrument: Blade Reconstituted: Yes Solution Type: saline Solution Amount: 10 ml Lot #: [id number] Solution Expiration Date: 10/03/2021 Secured: Yes Secured With: Steri-Strips Dressing Applied: Yes Primary Dressing: adaptic, SNAP Procedural Pain: 0 Post Procedural Pain: 0 Response to Treatment: Procedure was tolerated well Level of Consciousness (Post- Awake and Alert procedure): Post Procedure Diagnosis Same as Pre-procedure Electronic  Signature(s) Signed: 03/30/2020 6:38:59 PM By: Zenaida Deed RN, BSN Signed: 03/30/2020 6:41:43 PM By: Lenda Kelp PA-C Entered By: Zenaida Deed on 03/30/2020 08:39:52 -------------------------------------------------------------------------------- Debridement Details Patient Name: Date of Service: [name] 03/30/2020 8:00 A M Medical Record Number: [id number] Patient Account Number: [id number] Date of Birth/Sex: Treating RN: 29-Dec-1960 (59 y.o. [name] Standard Primary Care Provider: Docia Chuck, [place] Other Clinician: Referring Provider: Treating Provider/Extender: Richardo Priest, [place] Weeks in Treatment:  26 Debridement Performed for Assessment: Wound #4 Left,Proximal,Anterior Lower Leg Performed By: Physician Lenda Kelp, PA Debridement Type: Debridement Severity of Tissue Pre Debridement: Fat layer exposed Level of Consciousness (Pre-procedure): Awake and Alert Pre-procedure Verification/Time Out Yes - 08:27 Taken: Start Time: 08:28 Pain Control: Lidocaine 4% T opical Solution T Area Debrided (L x W): otal 0.9 (cm) x 0.7 (cm) = 0.63 (cm) Tissue and other material debrided: Viable, Non-Viable, Slough, Subcutaneous, Slough Level: Skin/Subcutaneous Tissue Debridement Description: Excisional Instrument: Curette Bleeding: Minimum Hemostasis Achieved: Pressure End Time: 08:31 Procedural Pain: 6 Post Procedural Pain: 3 Response to Treatment: Procedure was tolerated well Level of Consciousness (Post- Awake and Alert procedure): Post Debridement Measurements of Total Wound Length: (cm) 0.9 Width: (cm) 0.7 Depth: (cm) 0.3 Volume: (cm) 0.148 Character of Wound/Ulcer Post Debridement: Improved Severity of Tissue Post Debridement: Fat layer exposed Post Procedure Diagnosis Same as Pre-procedure Electronic Signature(s) Signed: 03/30/2020 6:38:59 PM By: Zenaida Deed RN, BSN Signed: 03/30/2020 6:41:43 PM By: Lenda Kelp PA-C Entered By:  Zenaida Deed on 03/30/2020 08:36:38 -------------------------------------------------------------------------------- HPI Details Patient Name: Date of Service: Cheyenne Aran B. 03/30/2020 8:00 A M Medical Record Number: [id number] Patient Account Number: [id number] Date of Birth/Sex: Treating RN: 24-Dec-1960 (59 y.o. [name] Standard Primary Care Provider: Docia Chuck, [place] Other Clinician: Referring Provider: Treating Provider/Extender: Richardo Priest, [place] Weeks in Treatment: 26 History of Present Illness HPI Description: 09/30/2019 upon evaluation today patient presents for initial inspection here in our clinic concerning issues that she has been experiencing with a wound on her left anterior lower leg. She unfortunately had a significant laceration which required 19 sutures and she shows me the pictures both before as well as after sutured in a very good job was done in this regard. Unfortunately the skin did not take and necrosis. Subsequently this wound open and she has been having more significant issues with that since. Fortunately there is no evidence of active infection at this time which is good news. No fever chills noted. She has a history of chronic venous stasis but is no longer able to wear any compression in fact she cannot even wear regular socks due to her neuropathy and the pain that she experiences in her feet if she does. For that reason she tries to elevate her legs as much as she can at nighttime but again she really cannot wear any type of compression. With regard to antibiotic it does not appear that she has been on anything recently. 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 Fortunately there is no evidence of local or systemic infection. No fevers,  chills, nausea, vomiting, or diarrhea. 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 10/07/2019 on evaluation today patient presents for follow-up concerning her wound on the left lower extremity. She has been tolerating the dressing changes without complication. With that being said she just got the Santyl 2 days ago. Obviously it has not had enough time to really do what we need to do she would like to obviously give this some time before proceeding with more aggressive debridement or otherwise. 10/21/2019 upon evaluation today patient appears to be doing better with regard to the overall appearance of her  wound is not quite as dry I do believe the Santyl has helped her improve to some degree here. Fortunately there is no signs of active infection at this time. I think we may be able to clean off the wound with a little bit of additional debridement today and likely we can transition away from the Santyl to some kind of collagen type dressing. 10/28/2019 upon evaluation today patient actually appears to be doing fairly well with regard to her wound today. She has been tolerating the dressing changes we did switch to collagen and fortunately that seems to be doing well for her at this point. There does not seem to be any signs of active infection I do feel like the surface of the wound is looking better at this time as well. 11/04/2019 upon evaluation today patient appears to be doing well with regard to her wound. She is making some progress here and the wound does appear to be measuring smaller which is good news. Overall this is good to be somewhat slow but nonetheless I am encouraged by the fact that the collagen does seem to have helped her. 11/11/2019 on evaluation today patient's wound is showing some signs of improvement. The good news is her overall trend does seem to be towards getting better compared to where she was previous. The smaller wounds along the side  actually might even be healed but we can watch this 1 more week before I heal this out. Overall I feel like there is improvement here. She is still using the silver collagen. 11/18/2019 upon evaluation today patient appears to be doing about the same with regard to her wound. This is not significantly smaller there still appears to be a lot of inflammation. For that reason I do want to see about actually applying triamcinolone to the actual wound bed to see if this can be of benefit as well. I thought about this for couple weeks but I think it may be a good thing to proceed with at this point. 11/25/2019 on evaluation today patient appears to be doing well with regard to her lower extremity ulcer. She has been tolerating the dressing changes without complication. Fortunately there is no signs of active infection at this time. No fevers, chills, nausea, vomiting, or diarrhea. 12/02/2019 on evaluation today patient appears to be doing okay with regard to her ulcer on the leg. Fortunately there is no signs of active infection at this time. No fever chills noted she has been tolerating the dressing changes without complication. With that being said overall I am pleased with the fact that there is no signs of infection but at the same time I really feel like she needs more to progress moving week to week and right now were really not seeing that. We have put in for approval for Apligraf which I think could help to move things along. With that being said we have not heard anything regarding approval at this point. 12/10/2019; patient comes in on my day although have not seen her previously. She has what was a traumatic wound on the left anterior mid tibia. Initially sutured however the area never really healed according to the patient. She has been using Santyl now using Prisma. There is exposed tendon. We have made application for Apligraf apparently this is going through [employer] still has not been  approved. She arrives in clinic with erythema around the wound and 4 small circular papules superior to the wound. These are nontender and she  is not complaining of any more pain. The exact cause of this is not really clear. The patient has idiopathic peripheral neuropathy which for which she has been extensively evaluated in the past she is not a diabetic she does not have any rheumatologic problems she is aware of. She cannot wear compression because it aggravates her neuropathy 4/15; traumatic wound on the left anterior mid tibia. She arrives today with extensive erythema spreading from the wound medially towards her heel. This is tender swollen and warm compatible with cellulitis. We are using silver collagen to the wound 4/20; 5-day follow-up. Traumatic wound on the left anterior mid tibia. This is in follow-up for the cellulitis she had on her visit 5 days ago. I gave her empiric cephalexin I did not culture this. She arrives today with the erythema that we marked much improved. She has not been systemically unwell Upon evaluation today patient's wound unfortunately is not doing very well this in fact is somewhat deeper than even last week4/28/2021 which is unfortunate. I am very concerned about the fact that to be honest that the patient does not seem to be doing as good as we would have expected in the past 2 weeks since I last saw her. Fortunately there is no evidence of active infection systemically which is good news. No fevers, chills, nausea, vomiting, or diarrhea. She has been on Keflex. 01/06/2020 upon evaluation today patient unfortunately appears to still be doing somewhat poorly overall in regard to her ulcer. There is definite signs of infection. She has been on Keflex in April but unfortunately this just does not seem to be doing as well as we would like. I do believe that we will get a need to place her on Augmentin in order to try to get this better. She tested positive for Proteus  and E. coli on the culture and is allergic to doxycycline. The patient's wound currently is not looking healthy at all to me. 01/27/2020 upon evaluation today patient appears to be doing maybe slightly better with regard to the wound bed at this point. Her infection is definitely improved which is great news. As far as the Apligraf I do not believe the wound surface is quite clean enough yet in order for [place] to apply the Apligraf. Another potential option would be for a snap VAC to be used under the Curlex and Coban wrap working to try the wrap today. If we can get rid of some of the fluid I think this will help her with healing also think the snap VAC could be beneficial in this regard. Nonetheless again I am not given up on the Apligraf is a possibility I just do not want a waste the applications by putting it on before the wound surface is ready. Potentially the snap VAC can get this wound to the point where it is ready much more quickly. 02/03/2020 upon evaluation today patient appears to be doing excellent with regard to her leg ulcer compared to where things have been. The overall quality of the wound bed is significantly improved and very pleased in this regard. We did get approval for the snap VAC which I am wanting to go ahead and see about initiating at this point. We also have approval for the Apligraf when and if the time is appropriate for this. With that being said I really think using the snap VAC to try to get this to fill-in would be appropriate to begin with. The patient is in agreement with  that plan. I do believe the compression did help her and she states that that was uncomfortable she was able to manage with that for now. 02/10/2020 upon evaluation today patient appears to be doing better with regard to her original and largest wound. 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 I explained that pyoderma is incompletely understood and obviously very difficult sometimes to treat but often steroid therapy is the mainstay of treatment. I think topical steroid treatment is appropriate at this point. She does have mometasone which has been prescribed previously by her primary care provider and she has that with her so we can use that on some of the satellite areas not on the main wound. The patient is in agreement with that plan. 02/17/2020 upon evaluation today patient appears to be doing well with regard to her wound. She is making good progress with the snap VAC actually believe that she is in a good position for [place] to be able to continue the snap VAC but actually at the Apligraf to try to speed up the granulation and epithelial growth. The patient is actually in agreement with that she wants to try to get this healed as quickly as possible I completely agree with her. She did fill up the canister again today. 03/02/2020 upon evaluation today patient appears to be doing well at this time with regard to her wound. In fact this is dramatically improved with the use of the snap VAC and the Apligraf even since the last time I saw her 2 weeks ago. Again week she had a nurse visit and changed out the snap VAC last week. Overall I think that she is definitely in a much better place and this wound is definitely making progress measuring smaller and overall I think progressing quite well. 7/13; wound surface looks very healthy. She has a small superior satellite lesion as well. Apligraf applied with a snap VAC. 03/30/2020 on evaluation today patient presents for follow-up concerning her lower extremity ulcers. Fortunately there is no signs of active infection at this time which is great news she has been tolerating the dressing changes without complication. Fortunately there is no evidence of anything  really worsening the smaller wound more proximal does have some slough noted that is can require sharp debridement today however the main wound for which we have been applying Apligraf and the wound VAC seems to be doing quite well. I am extremely pleased with where things stand today. Electronic Signature(s) Signed: 03/30/2020 9:55:01 AM By: Lenda Kelp PA-C Entered By: Lenda Kelp on 03/30/2020 09:55:01 -------------------------------------------------------------------------------- Physical Exam Details Patient Name: Date of Service: [name], [name] 03/30/2020 8:00 A M Medical Record Number: [id number] Patient Account Number: [id number] Date of Birth/Sex: Treating RN: 07-Nov-1960 (59 y.o. [name] Standard Primary Care Provider: Docia Chuck, [place] Other Clinician: Referring Provider: Treating Provider/Extender: Richardo Priest, [place] Weeks in Treatment: 72 Constitutional Well-nourished and well-hydrated in no acute distress. Respiratory normal breathing without difficulty. Psychiatric this patient is able to make decisions and demonstrates good insight into disease process. Alert and Oriented x 3. pleasant and cooperative. Notes Upon inspection patient's wound bed actually showed signs of good granulation at this time in regard to the main wound. I do believe the wound VAC is still  beneficial due to the fact that she has such significant swelling at this point and edema. This is causing a lot of drainage and subsequently she is collecting quite a bit of fluid draining out of the wound bed itself. Nonetheless I do feel like she is tolerating the wound VAC without complication. It would be nice to try to bridge between the lower and upper portion although I am not sure for to be able to accomplish this or not to be honest. Electronic Signature(s) Signed: 03/30/2020 9:55:38 AM By: Lenda Kelp PA-C Entered By: Lenda Kelp on 03/30/2020  09:55:38 -------------------------------------------------------------------------------- Physician Orders Details Patient Name: Date of Service: Cheyenne Aran B. 03/30/2020 8:00 A M Medical Record Number: [id number] Patient Account Number: [id number] Date of Birth/Sex: Treating RN: 1961-08-26 (59 y.o. 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 - wed for next apligraf Return A Nurse Visit: - 1 week post apligraf and SNAP change Dressing Change Frequency Wound #2 Left,Anterior Lower Leg Do not change entire dressing for one week. - DO NOT remove adaptic Wound #4 Left,Proximal,Anterior Lower Leg Do not change entire dressing for one week. Skin Barriers/Peri-Wound Care Moisturizing lotion Other: - mometasone cream to irritated areas on leg with dressing changes Wound Cleansing Wound #2 Left,Anterior Lower Leg May shower with protection. Primary Wound Dressing Wound #2 Left,Anterior Lower Leg pplication - apligraf #4 Skin Substitute A daptic - secured with steristrips Mepitel or A Wound #4 Left,Proximal,Anterior Lower Leg pplication - apligraf #4 Skin Substitute A daptic - adaptic secured with steristrips Mepitel or A Negative Presssure  Wound Therapy Wound #2 Left,Anterior Lower Leg SNAP Vac to wound continuously at 144mm/hg pressure - cut drape size down to minimum to hold in place, skin prep under drape, Bridge both wounds together Wound #4 Left,Proximal,Anterior Lower Leg SNAP Vac to wound continuously at 166mm/hg pressure - cut drape size down to minimum to hold in place, skin prep under drape, Bridge both wounds together Edema Control Kerlix and Coban - Left Lower Extremity Avoid standing for long periods of time Elevate legs to the level of the heart or above for 30 minutes daily and/or when sitting, a frequency of: - throughout the day Exercise regularly Electronic Signature(s) Signed: 03/30/2020 6:38:59 PM By: Zenaida Deed RN, BSN Signed: 03/30/2020 6:41:43 PM By: Lenda Kelp PA-C Entered By: Zenaida Deed on 03/30/2020 08:42:18 -------------------------------------------------------------------------------- Problem List Details Patient Name: Date of Service: Cheyenne Aran B. 03/30/2020 8:00 A M Medical Record Number: [id number] Patient Account Number: [id number] Date of Birth/Sex: Treating RN: Oct 22, 1960 (59 y.o. [name] Standard Primary Care Provider: Docia Chuck, [place] Other Clinician: Referring Provider: Treating Provider/Extender: Richardo Priest, [place] Weeks in Treatment: 26 Active Problems ICD-10 Encounter Code Description Active Date MDM Diagnosis I87.2 Venous insufficiency (chronic) (peripheral) 09/30/2019 No Yes L97.822 Non-pressure chronic ulcer of other part of left lower leg with fat layer exposed 09/30/2019 No Yes L03.116 Cellulitis of left  lower limb 12/17/2019 No Yes L97.828 Non-pressure chronic ulcer of other part of left lower leg with other specified 09/30/2019 No Yes severity L97.322 Non-pressure chronic ulcer of left ankle with fat layer exposed 02/03/2020 No Yes 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 03/30/2020 8:00 A M Medical Record Number: [id number] Patient Account Number: [id number] Date of Birth/Sex: Treating RN: [birth year] (59 y.o. [name] Standard Primary Care Provider: Docia Chuck, [place] Other Clinician: Referring Provider: Treating Provider/Extender: Richardo Priest, [place] Weeks in Treatment: 26 Subjective Chief Complaint Information obtained from Patient Left LE Ulcers History of Present Illness (HPI) 09/30/2019 upon evaluation today patient presents for initial inspection here in our clinic concerning issues that she has been experiencing with a wound on her left anterior lower leg. She unfortunately had a significant laceration which required 19 sutures and she shows me the pictures both before as well as after sutured in a very good job was done in this regard. Unfortunately the skin did not take and necrosis. Subsequently this wound open and she has been having more significant issues with that since. Fortunately there is no evidence of active infection at this time which is good news. No fever chills noted. She has a history of chronic venous stasis but is no longer able to wear any compression in fact she cannot even wear regular socks due to her neuropathy and the pain that she experiences in her feet if she does. For that reason she tries to elevate her legs as much as she can at nighttime but again she really cannot wear any type of compression. With regard to antibiotic it does not appear that she has been on anything recently. 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 Fortunately there is no evidence of local or systemic infection. No fevers, chills, nausea, vomiting, or diarrhea. 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 10/07/2019 on evaluation today patient presents for follow-up concerning her wound on the left lower extremity. She has been tolerating the dressing changes without complication. With that being said she just got the Santyl 2 days ago. Obviously it has not had enough time to really do what we need to do she would like to obviously give this some time before proceeding with more aggressive debridement or otherwise. 10/21/2019 upon evaluation today patient appears to be doing better with regard to the overall appearance of her wound is not quite as dry I do believe the Santyl has helped her improve to some degree here. Fortunately there is no signs of active infection at this time. I think we may be able to clean off the wound with a little bit of additional debridement today and likely we can transition away from the Santyl to some kind of collagen type dressing. 10/28/2019 upon evaluation today patient actually appears to be doing fairly well with regard to her wound today. She has  been tolerating the dressing changes we did switch to collagen and fortunately that seems to be doing well for her at this point. There does not seem to be any signs of active infection I do feel like the surface of the wound is looking better at this time as well. 11/04/2019 upon evaluation today patient appears to be doing well with regard to her wound. She is making some progress here and the wound does appear to be measuring smaller which is good news. Overall this is good to be somewhat slow but nonetheless I am  encouraged by the fact that the collagen does seem to have helped her. 11/11/2019 on evaluation today patient's wound is showing some signs of improvement. The good news is her overall trend does seem to be towards getting better compared to where she was previous. The smaller wounds along the side actually might even be healed but we can watch this 1 more week before I heal this out. Overall I feel like there is improvement here. She is still using the silver collagen. 11/18/2019 upon evaluation today patient appears to be doing about the same with regard to her wound. This is not significantly smaller there still appears to be a lot of inflammation. For that reason I do want to see about actually applying triamcinolone to the actual wound bed to see if this can be of benefit as well. I thought about this for couple weeks but I think it may be a good thing to proceed with at this point. 11/25/2019 on evaluation today patient appears to be doing well with regard to her lower extremity ulcer. She has been tolerating the dressing changes without complication. Fortunately there is no signs of active infection at this time. No fevers, chills, nausea, vomiting, or diarrhea. 12/02/2019 on evaluation today patient appears to be doing okay with regard to her ulcer on the leg. Fortunately there is no signs of active infection at this time. No fever chills noted she has been tolerating the dressing changes without complication. With that being said overall I am pleased with the fact that there is no signs of infection but at the same time I really feel like she needs more to progress moving week to week and right now were really not seeing that. We have put in for approval for Apligraf which I think could help to move things along. With that being said we have not heard anything regarding approval at this point. 12/10/2019; patient comes in on my day although have not seen her previously. She has what was a traumatic  wound on the left anterior mid tibia. Initially sutured however the area never really healed according to the patient. She has been using Santyl now using Prisma. There is exposed tendon. We have made application for Apligraf apparently this is going through [employer] still has not been approved. She arrives in clinic with erythema around the wound and 4 small circular papules superior to the wound. These are nontender and she is not complaining of any more pain. The exact cause of this is not really clear. The patient has idiopathic peripheral neuropathy which for which she has been extensively evaluated in the past she is not a diabetic she does not have any rheumatologic problems she is aware of. She cannot wear compression because it aggravates her neuropathy 4/15; traumatic wound on the left anterior mid tibia. She arrives today with extensive erythema spreading from the wound medially towards her heel. This is tender swollen  and warm compatible with cellulitis. We are using silver collagen to the wound 4/20; 5-day follow-up. Traumatic wound on the left anterior mid tibia. This is in follow-up for the cellulitis she had on her visit 5 days ago. I gave her empiric cephalexin I did not culture this. She arrives today with the erythema that we marked much improved. She has not been systemically unwell Upon evaluation today patient's wound unfortunately is not doing very well this in fact is somewhat deeper than even last week4/28/2021 which is unfortunate. I am very concerned about the fact that to be honest that the patient does not seem to be doing as good as we would have expected in the past 2 weeks since I last saw her. Fortunately there is no evidence of active infection systemically which is good news. No fevers, chills, nausea, vomiting, or diarrhea. She has been on Keflex. 01/06/2020 upon evaluation today patient unfortunately appears to still be doing somewhat poorly overall in  regard to her ulcer. There is definite signs of infection. She has been on Keflex in April but unfortunately this just does not seem to be doing as well as we would like. I do believe that we will get a need to place her on Augmentin in order to try to get this better. She tested positive for Proteus and E. coli on the culture and is allergic to doxycycline. The patient's wound currently is not looking healthy at all to me. 01/27/2020 upon evaluation today patient appears to be doing maybe slightly better with regard to the wound bed at this point. Her infection is definitely improved which is great news. As far as the Apligraf I do not believe the wound surface is quite clean enough yet in order for [place] to apply the Apligraf. Another potential option would be for a snap VAC to be used under the Curlex and Coban wrap working to try the wrap today. If we can get rid of some of the fluid I think this will help her with healing also think the snap VAC could be beneficial in this regard. Nonetheless again I am not given up on the Apligraf is a possibility I just do not want a waste the applications by putting it on before the wound surface is ready. Potentially the snap VAC can get this wound to the point where it is ready much more quickly. 02/03/2020 upon evaluation today patient appears to be doing excellent with regard to her leg ulcer compared to where things have been. The overall quality of the wound bed is significantly improved and very pleased in this regard. We did get approval for the snap VAC which I am wanting to go ahead and see about initiating at this point. We also have approval for the Apligraf when and if the time is appropriate for this. With that being said I really think using the snap VAC to try to get this to fill-in would be appropriate to begin with. The patient is in agreement with that plan. I do believe the compression did help her and she states that that was uncomfortable she  was able to manage with that for now. 02/10/2020 upon evaluation today patient appears to be doing better with regard to her original and largest wound. 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 I explained that pyoderma is incompletely understood and obviously very difficult sometimes to treat but often steroid therapy is the mainstay of treatment. I think topical steroid treatment is appropriate at this point. She does have mometasone which has been prescribed previously by her primary care provider and she has that with her so we can use that on some of the satellite areas not on the main wound. The patient is in agreement with that plan. 02/17/2020 upon evaluation today patient appears to be doing well with regard to her wound. She is making good progress with the snap VAC actually believe that she is in a good position for [place] to be able to continue the snap VAC but actually at the Apligraf to try to speed up the granulation and epithelial growth. The patient is actually in agreement with that she wants to try to get this healed as quickly as possible I completely agree with her. She did fill up the canister again today. 03/02/2020 upon evaluation today patient appears to be doing well at this time with regard to her wound. In fact this is dramatically improved with the use of the snap VAC and the Apligraf even since the last time I saw her 2 weeks ago. Again week she had a nurse visit and changed out the snap VAC last week. Overall I think that she is definitely in a much better place and this wound is definitely making progress measuring smaller and overall I think progressing quite well. 7/13; wound surface looks very healthy. She has a small superior satellite lesion as well. Apligraf applied with a snap  VAC. 03/30/2020 on evaluation today patient presents for follow-up concerning her lower extremity ulcers. Fortunately there is no signs of active infection at this time which is great news she has been tolerating the dressing changes without complication. Fortunately there is no evidence of anything really worsening the smaller wound more proximal does have some slough noted that is can require sharp debridement today however the main wound for which we have been applying Apligraf and the wound VAC seems to be doing quite well. I am extremely pleased with where things stand today. Objective Constitutional Well-nourished and well-hydrated in no acute distress. Vitals Time Taken: 8:07 AM, Height: 66 in, Weight: 156 lbs, BMI: 25.2, Temperature: 97.8 F, Pulse: 86 bpm, Respiratory Rate: 18 breaths/min, Blood Pressure: 129/85 mmHg. Respiratory normal breathing without difficulty. Psychiatric this patient is able to make decisions and demonstrates good insight into disease process. Alert and Oriented x 3. pleasant and cooperative. General Notes: Upon inspection patient's wound bed actually showed signs of good granulation at this time in regard to the main wound. I do believe the wound VAC is still beneficial due to the fact that she has such significant swelling at this point and edema. This is causing a lot of drainage and subsequently she is collecting quite a bit of fluid draining out of the wound bed itself. Nonetheless I do feel like she is tolerating the wound VAC without complication. It would be nice to try to bridge between the lower and upper portion although I am not sure for to be able to accomplish this or not to be honest. Integumentary (Hair, Skin) Wound #2 status is Open. Original cause of wound was Trauma. The wound is located on the Left,Anterior Lower Leg. The wound measures 3.6cm length x 2.3cm width x 0.1cm depth; 6.503cm^2 area and 0.65cm^3 volume. There is Fat Layer  (Subcutaneous Tissue)  Exposed exposed. There is no tunneling or undermining noted. There is a medium amount of serosanguineous drainage noted. The wound margin is epibole. There is large (67-100%) red, hyper - granulation within the wound bed. There is a small (1-33%) amount of necrotic tissue within the wound bed including Adherent Slough. Wound #4 status is Open. Original cause of wound was Gradually Appeared. The wound is located on the Left,Proximal,Anterior Lower Leg. The wound measures 0.9cm length x 0.7cm width x 0.3cm depth; 0.495cm^2 area and 0.148cm^3 volume. There is Fat Layer (Subcutaneous Tissue) Exposed exposed. There is no tunneling noted, however, there is undermining starting at 1:00 and ending at 4:00 with a maximum distance of 0.4cm. There is a medium amount of serous drainage noted. The wound margin is distinct with the outline attached to the wound base. There is no granulation within the wound bed. There is a large (67-100%) amount of necrotic tissue within the wound bed including Adherent Slough. 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 There was a Excisional Skin/Subcutaneous Tissue Debridement with a total area of 0.63 sq cm performed by Lenda Kelp, PA. With the following instrument(s): Curette to remove Viable and Non-Viable tissue/material. Material removed  includes Subcutaneous Tissue and Slough and after achieving pain control using Lidocaine 4% T opical Solution. No specimens were taken. A time out was conducted at 08:27, prior to the start of the procedure. A Minimum amount of bleeding was controlled with Pressure. The procedure was tolerated well with a pain level of 6 throughout and a pain level of 3 following the procedure. Post Debridement Measurements: 0.9cm length x 0.7cm width x 0.3cm depth; 0.148cm^3 volume. Character of Wound/Ulcer Post Debridement is improved. Severity of Tissue Post Debridement is: Fat layer exposed. Post procedure Diagnosis Wound #4: Same as Pre-Procedure Pre-procedure diagnosis of Wound #4 is a Venous Leg Ulcer located on the Left,Proximal,Anterior Lower Leg. A skin graft procedure using a bioengineered skin substitute/cellular or tissue based product was performed by Lenda Kelp, PA with the following instrument(s): Forceps and Scissors. Apligraf was applied and secured with Steri-Strips. 2 sq cm of product was utilized and 0 sq cm was wasted. Post Application, adaptic, SNAP was applied. A Time Out was conducted at 08:35, prior to the start of the procedure. The procedure was tolerated well with a pain level of 0 throughout and a pain level of 0 following the procedure. Post procedure Diagnosis Wound #4: Same as Pre-Procedure . Wound #2 Pre-procedure diagnosis of Wound #2 is a Venous Leg Ulcer located on the Left,Anterior Lower Leg. A skin graft procedure using a bioengineered skin substitute/cellular or tissue based product was performed by Lenda Kelp, PA with the following instrument(s): Forceps and Scissors. Apligraf was applied and secured with Steri-Strips. 42 sq cm of product was utilized and 0 sq cm was wasted. Post Application, adaptic, SNAP was applied. A Time Out was conducted at 08:35, prior to the start of the procedure. The procedure was tolerated well with a pain level of  0 throughout and a pain  level of 0 following the procedure. Post procedure Diagnosis Wound #2: Same as Pre-Procedure . Plan Follow-up Appointments: Return Appointment in 2 weeks. - wed for next apligraf Nurse Visit: - 1 week post apligraf and SNAP change Dressing Change Frequency: Wound #2 Left,Anterior Lower Leg: Do not change entire dressing for one week. - DO NOT remove adaptic Wound #4 Left,Proximal,Anterior Lower Leg: Do not change entire dressing for one week. Skin Barriers/Peri-Wound Care: Moisturizing lotion Other: - mometasone cream to irritated areas on leg with dressing changes Wound Cleansing: Wound #2 Left,Anterior Lower Leg: May shower with protection. Primary Wound Dressing: Wound #2 Left,Anterior Lower Leg: Skin Substitute Application - apligraf #4 Mepitel or Adaptic - secured with steristrips Wound #4 Left,Proximal,Anterior Lower Leg: Skin Substitute Application - apligraf #4 Mepitel or Adaptic - adaptic secured with steristrips Negative Presssure Wound Therapy: Wound #2 Left,Anterior Lower Leg: SNAP Vac to wound continuously at 144mm/hg pressure - cut drape size down to minimum to hold in place, skin prep under drape, Bridge both wounds together Wound #4 Left,Proximal,Anterior Lower Leg: SNAP Vac to wound continuously at 121mm/hg pressure - cut drape size down to minimum to hold in place, skin prep under drape, Bridge both wounds together Edema Control: Kerlix and Coban - Left Lower Extremity Avoid standing for long periods of time Elevate legs to the level of the heart or above for 30 minutes daily and/or when sitting, a frequency of: - throughout the day Exercise regularly 1. I am going to suggest that we continue with the Apligraf for the main wound which is more distal and the patient today tolerated the application of Apligraf without complication. In regard to the upper wound I did perform sharp debridement and we are going to try to bridge the negative pressure between the 2.  We are using a snap VAC at this point. Unfortunately in the end we were unable to accomplish this we had just maintain with the snap VAC over the lower wound in the upper wound we are just going to continue with a small piece of Apligraf in this area that was left over from the main wound application. We will see how things do over the next week. 2. I am going to suggest as well that we continue with the Curlex and Coban wrap to the left lower extremity which is helping with some of the edema control as well. 3. I am also going to suggest that we continue to monitor for any signs of worsening as far as infection is concerned everything appears to be doing quite well at this point. We will see patient back for reevaluation in 1 week here in the clinic. If anything worsens or changes patient will contact our office for additional recommendations. Electronic Signature(s) Signed: 03/30/2020 9:56:58 AM By: Lenda Kelp PA-C Entered By: Lenda Kelp on 03/30/2020 09:56:58 -------------------------------------------------------------------------------- SuperBill Details Patient Name: Date of Service: [name] 03/30/2020 Medical Record Number: [id number] Patient Account Number: [id number] Date of Birth/Sex: Treating RN: 1961/03/12 (59 y.o. [name] Standard Primary Care Provider: Docia Chuck, [place] Other Clinician: Referring Provider: Treating Provider/Extender: Richardo Priest, [place] Weeks in Treatment: 26 Diagnosis Coding ICD-10 Codes Code Description I87.2 Venous insufficiency (chronic) (peripheral) L97.822 Non-pressure chronic ulcer of other part of left lower leg with fat layer exposed L03.116 Cellulitis of left lower limb L97.828 Non-pressure chronic ulcer of other part of left lower leg with other specified severity L97.322 Non-pressure chronic ulcer of left ankle with  fat layer exposed 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 I73.00 Raynaud's syndrome without  gangrene 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, unspecified Facility Procedures CPT4 Code: [id number] ( Description: Facility Use Only) Apligraf 1 SQ CM Modifier: Quantity: 44 CPT4 Code: [id number] 1 Description: 5271 - SKIN SUB GRAFT TRNK/ARM/LEG ICD-10 Diagnosis Description L97.822 Non-pressure chronic ulcer of other part of left lower leg with fat layer expos Modifier: ed Quantity: 1 Physician Procedures : CPT4 Code Description Modifier [id number] 15271 - WC PHYS SKIN SUB GRAFT TRNK/ARM/LEG ICD-10 Diagnosis Description L97.822 Non-pressure chronic ulcer of other part of left lower leg with fat layer exposed Quantity: 1 Electronic Signature(s) Signed: 03/30/2020 10:05:32 AM By: Lenda KelpStone III, Goku Harb PA-C Entered By: Lenda KelpStone III, Kealy Lewter on 03/30/2020 10:05:32

## 2020-03-30 NOTE — Progress Notes (Signed)
[name], [name] ([id number]) Visit Report for 03/30/2020 Arrival Information Details Patient Name: Date of Service: PUALANI, BORAH 03/30/2020 8:00 A M Medical Record Number: [id number] Patient Account Number: [id number] Date of Birth/Sex: Treating RN: June 13, 1961 (59 y.o. [name] Primary Care Niccolas Loeper: [name], [place] Other Clinician: Referring Brees Hounshell: Treating Amato Sevillano/Extender: Dayna Ramus, [place] Weeks in Treatment: 26 Visit Information History Since Last Visit Added or deleted any medications: No Patient Arrived: Ambulatory Any new allergies or adverse reactions: No Arrival Time: 08:07 Had a fall or experienced change in No Accompanied By: alone activities of daily living that may affect Transfer Assistance: None risk of falls: Patient Identification Verified: Yes Signs or symptoms of abuse/neglect since last visito No Secondary Verification Process Completed: Yes Hospitalized since last visit: No Patient Requires Transmission-Based Precautions: No Implantable device outside of the clinic excluding No Patient Has Alerts: Yes cellular tissue based products placed in the center since last visit: Has Dressing in Place as Prescribed: No Has Compression in Place as Prescribed: No Pain Present Now: No Electronic Signature(s) Signed: 03/30/2020 6:13:34 PM By: Levan Hurst RN, BSN Entered By: Levan Hurst on 03/30/2020 08:07:44 -------------------------------------------------------------------------------- Lower Extremity Assessment Details Patient Name: Date of Service: [name] 03/30/2020 8:00 A M Medical Record Number: [id number] Patient Account Number: [id number] Date of Birth/Sex: Treating RN: 1961/06/09 (59 y.o. [name] Primary Care Teagyn Fishel: [place], [place] Other Clinician: Referring Lutie Pickler: Treating Netanya Yazdani/Extender: Dayna Ramus, [place] Weeks in Treatment: 26 Edema Assessment Assessed: [Left: No] [Right:  No] Edema: [Left: Ye] [Right: s] Calf Left: Right: Point of Measurement: 29 cm From Medial Instep 33 cm cm Ankle Left: Right: Point of Measurement: 8 cm From Medial Instep 22.8 cm cm Vascular Assessment Pulses: Dorsalis Pedis Palpable: [Left:Yes] Electronic Signature(s) Signed: 03/30/2020 6:13:34 PM By: Levan Hurst RN, BSN Entered By: Levan Hurst on 03/30/2020 08:13:51 -------------------------------------------------------------------------------- Multi-Disciplinary Care Plan Details Patient Name: Date of Service: Cheyenne Mccallum B. 03/30/2020 8:00 A M Medical Record Number: [id number] Patient Account Number: [id number] Date of Birth/Sex: Treating RN: 10/11/1960 (59 y.o. [name] Primary Care Evora Schechter: [name], [place] Other Clinician: Referring Lindzy Rupert: Treating Valente Fosberg/Extender: Dayna Ramus, [place] Weeks in Treatment: 26 Active Inactive Venous Leg Ulcer Nursing Diagnoses: Knowledge deficit related to disease process and management Potential for venous Insuffiency (use before diagnosis confirmed) Goals: Patient will maintain optimal edema control Date Initiated: 09/30/2019 Target Resolution Date: 04/27/2020 Goal Status: Active Patient/caregiver will verbalize understanding of disease process and disease management Date Initiated: 09/30/2019 Date Inactivated: 11/25/2019 Target Resolution Date: 11/25/2019 Goal Status: Met Interventions: Assess peripheral edema status every visit. Compression as ordered Provide education on venous insufficiency Treatment Activities: Therapeutic compression applied : 09/30/2019 Notes: Wound/Skin Impairment Nursing Diagnoses: Impaired tissue integrity Knowledge deficit related to ulceration/compromised skin integrity Goals: Patient/caregiver will verbalize understanding of skin care regimen Date Initiated: 09/30/2019 Target Resolution Date: 04/27/2020 Goal Status: Active Ulcer/skin breakdown will have a volume  reduction of 30% by week 4 Date Initiated: 09/30/2019 Date Inactivated: 10/28/2019 Target Resolution Date: 10/28/2019 Goal Status: Unmet Unmet Reason: other comorbidities Interventions: Assess patient/caregiver ability to obtain necessary supplies Assess patient/caregiver ability to perform ulcer/skin care regimen upon admission and as needed Assess ulceration(s) every visit Provide education on ulcer and skin care Treatment Activities: Skin care regimen initiated : 09/30/2019 Topical wound management initiated : 09/30/2019 Notes: Electronic Signature(s) Signed: 03/30/2020 6:38:59 PM By: Baruch Gouty RN, BSN Entered By: Baruch Gouty on 03/30/2020 08:31:07 -------------------------------------------------------------------------------- Negative Pressure Wound Therapy Maintenance (NPWT) Details Patient Name:  Date of Service: [name], [name] 03/30/2020 8:00 A M Medical Record Number: [id number] Patient Account Number: [id number] Date of Birth/Sex: Treating RN: 1961/01/12 (59 y.o. [name] Primary Care Parys Elenbaas: [name], [place] Other Clinician: Referring Tylisha Danis: Treating Deetta Siegmann/Extender: Dayna Ramus, [place] Weeks in Treatment: 26 NPWT Maintenance Performed for: Wound #2 Left, Anterior Lower Leg Additional Injuries Covered: Yes Additional Injuries: Wound #4 Left, Proximal, Anterior Lower Leg Performed By: Baruch Gouty, RN Type: Other Coverage Size (sq cm): 8.91 Pressure Type: Constant Pressure Setting: 125 mmHG Drain Type: None Primary Contact: Non-Adherent Sponge/Dressing Type: Foam, Green Date Initiated: 02/03/2020 Dressing Removed: No Quantity of Sponges/Gauze Removed: 1 Canister Changed: No Canister Exudate Volume: 60 Dressing Reapplied: No Quantity of Sponges/Gauze Inserted: 2 Respones T Treatment: o good Days On NPWT : 52 Post Procedure Diagnosis Same as Pre-procedure Electronic Signature(s) Signed: 03/30/2020 6:38:59 PM By: Baruch Gouty RN, BSN Entered By: Baruch Gouty on 03/30/2020 08:49:36 -------------------------------------------------------------------------------- Pain Assessment Details Patient Name: Date of Service: [name] 03/30/2020 8:00 A M Medical Record Number: [id number] Patient Account Number: [id number] Date of Birth/Sex: Treating RN: [birth year] (59 y.o. [name] Primary Care Cheryel Kyte: [name], [place] Other Clinician: Referring Ulus Hazen: Treating December Hedtke/Extender: Dayna Ramus, [place] Weeks in Treatment: 26 Active Problems Location of Pain Severity and Description of Pain Patient Has Paino No Site Locations Pain Management and Medication Current Pain Management: Electronic Signature(s) Signed: 03/30/2020 6:13:34 PM By: Levan Hurst RN, BSN Entered By: Levan Hurst on 03/30/2020 08:08:06 -------------------------------------------------------------------------------- Patient/Caregiver Education Details Patient Name: Date of Service: [name], Cheyenne B. 7/28/2021andnbsp8:00 [place] Record Number: [id number] Patient Account Number: [id number] Date of Birth/Gender: Treating RN: February 10, 1961 (60 y.o. [name] Primary Care Physician: [name], [place] Other Clinician: Referring Physician: Treating Physician/Extender: Dayna Ramus, [place] Weeks in Treatment: 34 Education Assessment Education Provided To: Patient Education Topics Provided Venous: Methods: Explain/Verbal Responses: Reinforcements needed, State content correctly Wound/Skin Impairment: Methods: Explain/Verbal Responses: Reinforcements needed, State content correctly Electronic Signature(s) Signed: 03/30/2020 6:38:59 PM By: Baruch Gouty RN, BSN Entered By: Baruch Gouty on 03/30/2020 08:31:28 -------------------------------------------------------------------------------- Wound Assessment Details Patient Name: Date of Service: [name] 03/30/2020 8:00 A  M Medical Record Number: [id number] Patient Account Number: [id number] Date of Birth/Sex: Treating RN: 10-20-1960 (59 y.o. [name] Primary Care Aynsley Fleet: [name], [place] Other Clinician: Referring Aili Casillas: Treating Mitchell Epling/Extender: Dayna Ramus, [place] Weeks in Treatment: 26 Wound Status Wound Number: 2 Primary Etiology: Venous Leg Ulcer Wound Location: Left, Anterior Lower Leg Wound Status: Open Wounding Event: Trauma Comorbid History: Lymphedema, Raynauds, Neuropathy Date Acquired: 08/06/2019 Weeks Of Treatment: 26 Clustered Wound: No Wound Measurements Length: (cm) 3.6 Width: (cm) 2.3 Depth: (cm) 0.1 Area: (cm) 6.503 Volume: (cm) 0.65 % Reduction in Area: 64.3% % Reduction in Volume: 82.2% Epithelialization: Small (1-33%) Tunneling: No Undermining: No Wound Description Classification: Full Thickness With Exposed Support Structures Wound Margin: Epibole Exudate Amount: Medium Exudate Type: Serosanguineous Exudate Color: red, brown Foul Odor After Cleansing: No Slough/Fibrino Yes Wound Bed Granulation Amount: Large (67-100%) Exposed Structure Granulation Quality: Red, Hyper-granulation Fascia Exposed: No Necrotic Amount: Small (1-33%) Fat Layer (Subcutaneous Tissue) Exposed: Yes Necrotic Quality: Adherent Slough Tendon Exposed: No Muscle Exposed: No Joint Exposed: No Bone Exposed: No Electronic Signature(s) Signed: 03/30/2020 6:13:34 PM By: Levan Hurst RN, BSN Entered By: Levan Hurst on 03/30/2020 08:20:59 -------------------------------------------------------------------------------- Wound Assessment Details Patient Name: Date of Service: [name] 03/30/2020 8:00 A M Medical Record Number: [id number] Patient Account Number: [id number] Date of Birth/Sex:  Treating RN: 1961/07/09 (59 y.o. [name] Primary Care Husam Hohn: [name], [place] Other Clinician: Referring Riese Hellard: Treating Timara Loma/Extender: Dayna Ramus, [place] Weeks in Treatment: 26 Wound Status Wound Number: 4 Primary Etiology: Venous Leg Ulcer Wound Location: Left, Proximal, Anterior Lower Leg Wound Status: Open Wounding Event: Gradually Appeared Comorbid History: Lymphedema, Raynauds, Neuropathy Date Acquired: 01/20/2020 Weeks Of Treatment: 9 Clustered Wound: No Wound Measurements Length: (cm) 0.9 Width: (cm) 0.7 Depth: (cm) 0.3 Area: (cm) 0.495 Volume: (cm) 0.148 % Reduction in Area: -109.7% % Reduction in Volume: -108.5% Epithelialization: Small (1-33%) Tunneling: No Undermining: Yes Starting Position (o'clock): 1 Ending Position (o'clock): 4 Maximum Distance: (cm) 0.4 Wound Description Classification: Full Thickness Without Exposed Support Structures Wound Margin: Distinct, outline attached Exudate Amount: Medium Exudate Type: Serous Exudate Color: amber Foul Odor After Cleansing: No Slough/Fibrino Yes Wound Bed Granulation Amount: None Present (0%) Exposed Structure Necrotic Amount: Large (67-100%) Fascia Exposed: No Necrotic Quality: Adherent Slough Fat Layer (Subcutaneous Tissue) Exposed: Yes Tendon Exposed: No Muscle Exposed: No Joint Exposed: No Bone Exposed: No Electronic Signature(s) Signed: 03/30/2020 6:13:34 PM By: Levan Hurst RN, BSN Entered By: Levan Hurst on 03/30/2020 08:14:52 -------------------------------------------------------------------------------- [place] Details Patient Name: Date of Service: Cheyenne Mccallum B. 03/30/2020 8:00 A M Medical Record Number: [id number] Patient Account Number: [id number] Date of Birth/Sex: Treating RN: [birth year] (59 y.o. [name] Primary Care Ash Mcelwain: [name], [place] Other Clinician: Referring Penina Reisner: Treating Zeeshan Korte/Extender: Dayna Ramus, [place] Weeks in Treatment: 26 Vital Signs Time Taken: 08:07 Temperature (F): 97.8 Height (in): 66 Pulse (bpm): 86 Weight (lbs): 156 Respiratory Rate (breaths/min):  18 Body Mass Index (BMI): 25.2 Blood Pressure (mmHg): 129/85 Reference Range: 80 - 120 mg / dl Electronic Signature(s) Signed: 03/30/2020 6:13:34 PM By: Levan Hurst RN, BSN Entered By: Levan Hurst on 03/30/2020 08:08:01

## 2020-04-06 ENCOUNTER — Encounter (HOSPITAL_BASED_OUTPATIENT_CLINIC_OR_DEPARTMENT_OTHER): Payer: Worker's Compensation | Attending: Physician Assistant | Admitting: Physician Assistant

## 2020-04-06 DIAGNOSIS — I7300 Raynaud's syndrome without gangrene: Secondary | ICD-10-CM | POA: Insufficient documentation

## 2020-04-06 DIAGNOSIS — L03116 Cellulitis of left lower limb: Secondary | ICD-10-CM | POA: Diagnosis not present

## 2020-04-06 DIAGNOSIS — G609 Hereditary and idiopathic neuropathy, unspecified: Secondary | ICD-10-CM | POA: Insufficient documentation

## 2020-04-06 DIAGNOSIS — I872 Venous insufficiency (chronic) (peripheral): Secondary | ICD-10-CM | POA: Diagnosis not present

## 2020-04-06 DIAGNOSIS — L97822 Non-pressure chronic ulcer of other part of left lower leg with fat layer exposed: Secondary | ICD-10-CM | POA: Diagnosis not present

## 2020-04-06 DIAGNOSIS — L97828 Non-pressure chronic ulcer of other part of left lower leg with other specified severity: Secondary | ICD-10-CM | POA: Diagnosis not present

## 2020-04-06 DIAGNOSIS — L97322 Non-pressure chronic ulcer of left ankle with fat layer exposed: Secondary | ICD-10-CM | POA: Diagnosis not present

## 2020-04-06 DIAGNOSIS — M3501 Sicca syndrome with keratoconjunctivitis: Secondary | ICD-10-CM | POA: Insufficient documentation

## 2020-04-07 NOTE — Progress Notes (Signed)
[name], [name] ([id number]) Visit Report for 04/06/2020 [place] Details Patient Name: Date of Service: [name], [name] 04/06/2020 Medical Record Number: [id number] Patient Account Number: [id number] Date of Birth/Sex: Treating RN: [birth year] (59 y.o. 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

## 2020-04-07 NOTE — Progress Notes (Signed)
CHRISTIAN, TREADWAY ([id number]) Visit Report for 04/06/2020 Arrival Information Details Patient Name: Date of Service: [name], [name] 04/06/2020 8:00 A M Medical Record Number: [id number] Patient Account Number: [id number] Date of Birth/Sex: Treating RN: 1961/08/17 (59 y.o. [name] Standard Primary Care Loralei Radcliffe: Docia Chuck, [place] Other Clinician: Referring Genell Thede: Treating Catherina Pates/Extender: Richardo Priest, [place] Weeks in Treatment: 46 Visit Information History Since Last Visit Added or deleted any medications: No Patient Arrived: Ambulatory Any new allergies or adverse reactions: No Arrival Time: 08:03 Had a fall or experienced change in No Accompanied By: self activities of daily living that may affect Transfer Assistance: None risk of falls: Patient Identification Verified: Yes Signs or symptoms of abuse/neglect since last visito No Secondary Verification Process Completed: Yes Hospitalized since last visit: No Patient Requires Transmission-Based Precautions: No Implantable device outside of the clinic excluding No Patient Has Alerts: Yes cellular tissue based products placed in the center since last visit: Has Dressing in Place as Prescribed: Yes Pain Present Now: No Electronic Signature(s) Signed: 04/06/2020 10:37:40 AM By: Karl Ito Entered By: Karl Ito on 04/06/2020 08:03:56 -------------------------------------------------------------------------------- Encounter Discharge Information Details Patient Name: Date of Service: Cheyenne Aran B. 04/06/2020 8:00 A M Medical Record Number: [id number] Patient Account Number: [id number] Date of Birth/Sex: Treating RN: 18-Mar-1961 (59 y.o. Wynelle Link Primary Care Zoran Yankee: Docia Chuck, [place] Other Clinician: Referring Lorrine Killilea: Treating Ronda Kazmi/Extender: Richardo Priest, [place] Weeks in Treatment: 19 Encounter Discharge Information Items Discharge Condition: Stable Ambulatory Status:  Ambulatory Discharge Destination: Home Transportation: Private Auto Accompanied By: alone Schedule Follow-up Appointment: Yes Clinical Summary of Care: Patient Declined Electronic Signature(s) Signed: 04/07/2020 5:21:32 PM By: Zandra Abts RN, BSN Entered By: Zandra Abts on 04/06/2020 08:34:38 -------------------------------------------------------------------------------- Negative Pressure Wound Therapy Maintenance (NPWT) Details Patient Name: Date of Service: [name], [name] 04/06/2020 8:00 A M Medical Record Number: [id number] Patient Account Number: [id number] Date of Birth/Sex: Treating RN: 12-Nov-1960 (59 y.o. Wynelle Link Primary Care Sergio Hobart: Docia Chuck, [place] Other Clinician: Referring Michaelah Credeur: Treating Tanieka Pownall/Extender: Richardo Priest, [place] Weeks in Treatment: 27 NPWT Maintenance Performed for: Wound #2 Left, Anterior Lower Leg Additional Injuries Covered: No Performed By: Zandra Abts, RN Type: Other Coverage Size (sq cm): 8.91 Pressure Type: Constant Pressure Setting: 125 mmHG Drain Type: None Sponge/Dressing Type: Foam, Green Date Initiated: 02/03/2020 Dressing Removed: Yes Quantity of Sponges/Gauze Removed: 1 piece blue foam removed Canister Changed: Yes Canister Exudate Volume: 50 Dressing Reapplied: Yes Quantity of Sponges/Gauze Inserted: 1 piece blue foam applied Respones T Treatment: o pt tolerated well Days On NPWT : 64 Electronic Signature(s) Signed: 04/07/2020 5:21:32 PM By: Zandra Abts RN, BSN Entered By: Zandra Abts on 04/06/2020 08:33:25 -------------------------------------------------------------------------------- Wound Assessment Details Patient Name: Date of Service: [name] 04/06/2020 8:00 A M Medical Record Number: [id number] Patient Account Number: [id number] Date of Birth/Sex: Treating RN: 1961/01/09 (59 y.o. [name] Standard Primary Care Carneshia Raker: Docia Chuck, [place] Other Clinician: Referring  Marcanthony Sleight: Treating Jodee Wagenaar/Extender: Richardo Priest, [place] Weeks in Treatment: 27 Wound Status Wound Number: 2 Primary Etiology: Venous Leg Ulcer Wound Location: Left, Anterior Lower Leg Wound Status: Open Wounding Event: Trauma Date Acquired: 08/06/2019 Weeks Of Treatment: 27 Clustered Wound: No Wound Measurements Length: (cm) 3.6 Width: (cm) 2.3 Depth: (cm) 0.1 Area: (cm) 6.503 Volume: (cm) 0.65 % Reduction in Area: 64.3% % Reduction in Volume: 82.2% Wound Description Classification: Full Thickness With Exposed Support Structures Treatment Notes Wound #2 (Left, Anterior Lower Leg) 3. Primary Dressing Applied Other primary dressing (specifiy in notes) 6. Support  Layer Applied Kerlix/Coban Notes adaptic over apligraft to anterior lower leg site (steri-strips replaced and snap vac changed). steri-strip remains intact to anterior proximal site. Electronic Signature(s) Signed: 04/06/2020 10:37:40 AM By: Karl Ito Signed: 04/06/2020 4:54:27 PM By: Zenaida Deed RN, BSN Entered By: Karl Ito on 04/06/2020 08:04:47 -------------------------------------------------------------------------------- Wound Assessment Details Patient Name: Date of Service: [name] 04/06/2020 8:00 A M Medical Record Number: [id number] Patient Account Number: [id number] Date of Birth/Sex: Treating RN: [birth year] (59 y.o. [name] Standard Primary Care Lyon Dumont: Docia Chuck, [place] Other Clinician: Referring Riven Mabile: Treating Mariena Meares/Extender: Richardo Priest, [place] Weeks in Treatment: 27 Wound Status Wound Number: 4 Primary Etiology: Venous Leg Ulcer Wound Location: Left, Proximal, Anterior Lower Leg Wound Status: Open Wounding Event: Gradually Appeared Date Acquired: 01/20/2020 Weeks Of Treatment: 10 Clustered Wound: No Wound Measurements Length: (cm) 0.9 Width: (cm) 0.7 Depth: (cm) 0.3 Area: (cm) 0.495 Volume: (cm) 0.148 % Reduction in Area:  -109.7% % Reduction in Volume: -108.5% Wound Description Classification: Full Thickness Without Exposed Support Structu res Treatment Notes Wound #4 (Left, Proximal, Anterior Lower Leg) 3. Primary Dressing Applied Other primary dressing (specifiy in notes) 6. Support Layer Applied Kerlix/Coban Notes adaptic over apligraft to anterior lower leg site (steri-strips replaced and snap vac changed). steri-strip remains intact to anterior proximal site. Electronic Signature(s) Signed: 04/06/2020 10:37:40 AM By: Karl Ito Signed: 04/06/2020 4:54:27 PM By: Zenaida Deed RN, BSN Entered By: Karl Ito on 04/06/2020 08:04:47 -------------------------------------------------------------------------------- Vitals Details Patient Name: Date of Service: Cheyenne Aran B. 04/06/2020 8:00 A M Medical Record Number: [id number] Patient Account Number: [id number] Date of Birth/Sex: Treating RN: November 29, 1960 (58 y.o. [name] Standard Primary Care Jenaro Souder: Docia Chuck, [place] Other Clinician: Referring Andrew Soria: Treating Kissa Campoy/Extender: Richardo Priest, [place] Weeks in Treatment: 27 Vital Signs Time Taken: 08:04 Temperature (F): 97.6 Height (in): 66 Pulse (bpm): 71 Weight (lbs): 156 Respiratory Rate (breaths/min): 18 Body Mass Index (BMI): 25.2 Blood Pressure (mmHg): 131/86 Reference Range: 80 - 120 mg / dl Electronic Signature(s) Signed: 04/06/2020 10:37:40 AM By: Karl Ito Entered By: Karl Ito on 04/06/2020 08:04:32

## 2020-04-13 ENCOUNTER — Encounter (HOSPITAL_BASED_OUTPATIENT_CLINIC_OR_DEPARTMENT_OTHER): Payer: Worker's Compensation | Admitting: Physician Assistant

## 2020-04-13 DIAGNOSIS — L97822 Non-pressure chronic ulcer of other part of left lower leg with fat layer exposed: Secondary | ICD-10-CM | POA: Diagnosis not present

## 2020-04-13 NOTE — Progress Notes (Signed)
[name], [name] ([id number]) Visit Report for 04/13/2020 Arrival Information Details Patient Name: Date of Service: [name], [name] 04/13/2020 3:45 PM Medical Record Number: [id number] Patient Account Number: [id number] Date of Birth/Sex: Treating RN: 05/14/1961 (59 y.o. [name] Primary Care Claud Gowan: [name], [place] Other Clinician: Referring Latonja Bobeck: Treating Blondine Hottel/Extender: Dayna Ramus, [place] Weeks in Treatment: 28 Visit Information History Since Last Visit Added or deleted any medications: No Patient Arrived: Ambulatory Any new allergies or adverse reactions: No Arrival Time: 16:12 Had a fall or experienced change in No Accompanied By: self activities of daily living that may affect Transfer Assistance: None risk of falls: Patient Identification Verified: Yes Signs or symptoms of abuse/neglect since last visito No Secondary Verification Process Completed: Yes Hospitalized since last visit: No Patient Requires Transmission-Based Precautions: No Implantable device outside of the clinic excluding No Patient Has Alerts: Yes cellular tissue based products placed in the center since last visit: Has Dressing in Place as Prescribed: Yes Has Compression in Place as Prescribed: Yes Pain Present Now: No Electronic Signature(s) Signed: 04/13/2020 5:47:32 PM By: Kela Millin Entered By: Kela Millin on 04/13/2020 16:12:32 -------------------------------------------------------------------------------- Encounter Discharge Information Details Patient Name: Date of Service: [name] 04/13/2020 3:45 PM Medical Record Number: [id number] Patient Account Number: [id number] Date of Birth/Sex: Treating RN: 1961/04/22 (59 y.o. [name] Primary Care Elmo Shumard: [name], [place] Other Clinician: Referring Wilmot Quevedo: Treating Ngina Royer/Extender: Dayna Ramus, [place] Weeks in Treatment: 64 Encounter Discharge Information Items Post  Procedure Vitals Discharge Condition: Stable Temperature (F): 98.2 Ambulatory Status: Ambulatory Pulse (bpm): 69 Discharge Destination: Home Respiratory Rate (breaths/min): 17 Transportation: Private Auto Blood Pressure (mmHg): 137/83 Accompanied By: self Schedule Follow-up Appointment: Yes Clinical Summary of Care: Patient Declined Electronic Signature(s) Signed: 04/13/2020 5:47:32 PM By: Kela Millin Entered By: Kela Millin on 04/13/2020 17:38:27 -------------------------------------------------------------------------------- Lower Extremity Assessment Details Patient Name: Date of Service: [name], [name] 04/13/2020 3:45 PM Medical Record Number: [id number] Patient Account Number: [id number] Date of Birth/Sex: Treating RN: June 24, 1961 (59 y.o. [name] Primary Care Audria Takeshita: [name], [place] Other Clinician: Referring Shigeo Baugh: Treating Lorre Opdahl/Extender: Dayna Ramus, [place] Weeks in Treatment: 28 Edema Assessment Assessed: [Left: No] [Right: No] Edema: [Left: Ye] [Right: s] Calf Left: Right: Point of Measurement: 29 cm From Medial Instep 33.5 cm cm Ankle Left: Right: Point of Measurement: 8 cm From Medial Instep 22 cm cm Vascular Assessment Pulses: Dorsalis Pedis Palpable: [Left:Yes] Electronic Signature(s) Signed: 04/13/2020 5:47:32 PM By: Kela Millin Entered By: Kela Millin on 04/13/2020 16:13:49 -------------------------------------------------------------------------------- Multi-Disciplinary Care Plan Details Patient Name: Date of Service: [name] 04/13/2020 3:45 PM Medical Record Number: [id number] Patient Account Number: [id number] Date of Birth/Sex: Treating RN: [birth year] (59 y.o. [name] Primary Care Dvaughn Fickle: [name], [place] Other Clinician: Referring Lester Platas: Treating Lounell Schumacher/Extender: Dayna Ramus, [place] Weeks in Treatment: 28 Active Inactive Venous Leg Ulcer Nursing  Diagnoses: Knowledge deficit related to disease process and management Potential for venous Insuffiency (use before diagnosis confirmed) Goals: Patient will maintain optimal edema control Date Initiated: 09/30/2019 Target Resolution Date: 04/27/2020 Goal Status: Active Patient/caregiver will verbalize understanding of disease process and disease management Date Initiated: 09/30/2019 Date Inactivated: 11/25/2019 Target Resolution Date: 11/25/2019 Goal Status: Met Interventions: Assess peripheral edema status every visit. Compression as ordered Provide education on venous insufficiency Treatment Activities: Therapeutic compression applied : 09/30/2019 Notes: Wound/Skin Impairment Nursing Diagnoses: Impaired tissue integrity Knowledge deficit related to ulceration/compromised skin integrity Goals: Patient/caregiver will verbalize understanding of skin care regimen Date Initiated: 09/30/2019 Target  Resolution Date: 04/27/2020 Goal Status: Active Ulcer/skin breakdown will have a volume reduction of 30% by week 4 Date Initiated: 09/30/2019 Date Inactivated: 10/28/2019 Target Resolution Date: 10/28/2019 Goal Status: Unmet Unmet Reason: other comorbidities Interventions: Assess patient/caregiver ability to obtain necessary supplies Assess patient/caregiver ability to perform ulcer/skin care regimen upon admission and as needed Assess ulceration(s) every visit Provide education on ulcer and skin care Treatment Activities: Skin care regimen initiated : 09/30/2019 Topical wound management initiated : 09/30/2019 Notes: Electronic Signature(s) Signed: 04/13/2020 6:14:05 PM By: Baruch Gouty RN, BSN Entered By: Baruch Gouty on 04/13/2020 17:43:21 -------------------------------------------------------------------------------- Negative Pressure Wound Therapy Application (NPWT) Details Patient Name: Date of Service: [name], [name] 04/13/2020 3:45 PM Medical Record Number:  [id number] Patient Account Number: [id number] Date of Birth/Sex: Treating RN: 1961-08-10 (59 y.o. [name] Primary Care Jomel Whittlesey: [name], [place] Other Clinician: Referring Harvin Konicek: Treating Zyniah Ferraiolo/Extender: Dayna Ramus, [place] Weeks in Treatment: 28 NPWT Application Performed for: Wound #4 Left, Proximal, Anterior Lower Leg Performed By: Baruch Gouty, RN Type: Other Coverage Size (sq cm): 0.35 Pressure Type: Constant Pressure Setting: 125 mmHG Drain Type: None Primary Contact: Other Other: apligraf Quantity of Sponges/Gauze Inserted: 1 Sponge/Dressing Type: Foam, Green Date Initiated: 04/13/2020 Response to Treatment: good Post Procedure Diagnosis Same as Pre-procedure Notes SNAP Electronic Signature(s) Signed: 04/13/2020 6:14:05 PM By: Baruch Gouty RN, BSN Entered By: Baruch Gouty on 04/13/2020 17:39:23 -------------------------------------------------------------------------------- Pain Assessment Details Patient Name: Date of Service: [name] 04/13/2020 3:45 PM Medical Record Number: [id number] Patient Account Number: [id number] Date of Birth/Sex: Treating RN: 1961/02/25 (59 y.o. [name] Primary Care Kaydee Magel: [name], [place] Other Clinician: Referring Quetzally Callas: Treating Armeda Plumb/Extender: Dayna Ramus, [place] Weeks in Treatment: 28 Active Problems Location of Pain Severity and Description of Pain Patient Has Paino No Site Locations Pain Management and Medication Current Pain Management: Electronic Signature(s) Signed: 04/13/2020 5:47:32 PM By: Kela Millin Entered By: Kela Millin on 04/13/2020 16:13:16 -------------------------------------------------------------------------------- Patient/Caregiver Education Details Patient Name: Date of Service: [name] 8/11/2021andnbsp3:45 PM Medical Record Number: [id number] Patient Account Number: [id number] Date of Birth/Gender: Treating  RN: Apr 11, 1961 (59 y.o. [name] Primary Care Physician: [name], [place] Other Clinician: Referring Physician: Treating Physician/Extender: Dayna Ramus, [place] Weeks in Treatment: 67 Education Assessment Education Provided To: Patient Education Topics Provided Venous: Methods: Explain/Verbal Responses: Reinforcements needed, State content correctly Wound/Skin Impairment: Methods: Explain/Verbal Responses: Reinforcements needed, State content correctly Electronic Signature(s) Signed: 04/13/2020 6:14:05 PM By: Baruch Gouty RN, BSN Entered By: Baruch Gouty on 04/13/2020 17:43:41 -------------------------------------------------------------------------------- Wound Assessment Details Patient Name: Date of Service: [name] 04/13/2020 3:45 PM Medical Record Number: [id number] Patient Account Number: [id number] Date of Birth/Sex: Treating RN: 10-17-1960 (59 y.o. [name] Primary Care Achille Xiang: [name], [place] Other Clinician: Referring Devarius Nelles: Treating Takuma Cifelli/Extender: Dayna Ramus, [place] Weeks in Treatment: 28 Wound Status Wound Number: 2 Primary Etiology: Venous Leg Ulcer Wound Location: Left, Anterior Lower Leg Wound Status: Open Wounding Event: Trauma Comorbid History: Lymphedema, Raynauds, Neuropathy Date Acquired: 08/06/2019 Weeks Of Treatment: 28 Clustered Wound: No Wound Measurements Length: (cm) 3.2 Width: (cm) 2.1 Depth: (cm) 0.2 Area: (cm) 5.278 Volume: (cm) 1.056 % Reduction in Area: 71.1% % Reduction in Volume: 71% Epithelialization: Small (1-33%) Tunneling: No Undermining: No Wound Description Classification: Full Thickness With Exposed Support Structures Wound Margin: Distinct, outline attached Exudate Amount: Medium Exudate Type: Serosanguineous Exudate Color: red, brown Foul Odor After Cleansing: No Slough/Fibrino No Wound Bed Granulation Amount: Large (67-100%) Exposed  Structure Granulation Quality: Red,  Pink Fascia Exposed: No Necrotic Amount: None Present (0%) Fat Layer (Subcutaneous Tissue) Exposed: Yes Tendon Exposed: No Muscle Exposed: No Joint Exposed: No Bone Exposed: No Treatment Notes Wound #2 (Left, Anterior Lower Leg) 1. Cleanse With Wound Cleanser Soap and water 2. Periwound Care Skin Prep 3. Primary Dressing Applied Cellular Based Tissue Product 4. Secondary Dressing Other secondary dressing (specify in notes) 6. Support Layer Applied Kerlix/Coban Other support layer (specify in notes) Notes apligraft/adaptic/steri-strips over anterior site with drawtex. proximal wound is apligraft/adaptic and snap vac. kerlix/coban to leg Electronic Signature(s) Signed: 04/13/2020 5:47:32 PM By: Kela Millin Entered By: Kela Millin on 04/13/2020 16:14:30 -------------------------------------------------------------------------------- Wound Assessment Details Patient Name: Date of Service: [name], [name] 04/13/2020 3:45 PM Medical Record Number: [id number] Patient Account Number: [id number] Date of Birth/Sex: Treating RN: 03/07/1961 (59 y.o. [name] Primary Care Earlena Werst: [name], [place] Other Clinician: Referring Viviana Trimble: Treating Blessed Girdner/Extender: Dayna Ramus, [place] Weeks in Treatment: 28 Wound Status Wound Number: 4 Primary Etiology: Venous Leg Ulcer Wound Location: Left, Proximal, Anterior Lower Leg Wound Status: Open Wounding Event: Gradually Appeared Comorbid History: Lymphedema, Raynauds, Neuropathy Date Acquired: 01/20/2020 Weeks Of Treatment: 11 Clustered Wound: No Wound Measurements Length: (cm) 0.7 Width: (cm) 0.5 Depth: (cm) 0.5 Area: (cm) 0.275 Volume: (cm) 0.137 % Reduction in Area: -16.5% % Reduction in Volume: -93% Epithelialization: None Tunneling: No Undermining: Yes Starting Position (o'clock): 11 Ending Position (o'clock): 7 Maximum Distance: (cm) 0.4 Wound  Description Classification: Full Thickness Without Exposed Support Structures Wound Margin: Well defined, not attached Exudate Amount: Medium Exudate Type: Serosanguineous Exudate Color: red, brown Foul Odor After Cleansing: No Slough/Fibrino Yes Wound Bed Granulation Amount: Medium (34-66%) Exposed Structure Granulation Quality: Pink Fascia Exposed: No Necrotic Amount: Medium (34-66%) Fat Layer (Subcutaneous Tissue) Exposed: Yes Necrotic Quality: Adherent Slough Tendon Exposed: No Muscle Exposed: No Joint Exposed: No Bone Exposed: No Treatment Notes Wound #4 (Left, Proximal, Anterior Lower Leg) 1. Cleanse With Wound Cleanser Soap and water 2. Periwound Care Skin Prep 3. Primary Dressing Applied Cellular Based Tissue Product 4. Secondary Dressing Other secondary dressing (specify in notes) 6. Support Layer Applied Kerlix/Coban Other support layer (specify in notes) Notes apligraft/adaptic/steri-strips over anterior site with drawtex. proximal wound is apligraft/adaptic and snap vac. kerlix/coban to leg Electronic Signature(s) Signed: 04/13/2020 5:47:32 PM By: Kela Millin Entered By: Kela Millin on 04/13/2020 16:15:15 -------------------------------------------------------------------------------- Vitals Details Patient Name: Date of Service: [name] 04/13/2020 3:45 PM Medical Record Number: [id number] Patient Account Number: [id number] Date of Birth/Sex: Treating RN: 1961/01/24 (59 y.o. [name] Primary Care Ahmani Daoud: [name], [place] Other Clinician: Referring Angi Goodell: Treating Janelli Welling/Extender: Dayna Ramus, [place] Weeks in Treatment: 28 Vital Signs Time Taken: 16:10 Temperature (F): 98.2 Height (in): 66 Pulse (bpm): 69 Weight (lbs): 156 Respiratory Rate (breaths/min): 17 Body Mass Index (BMI): 25.2 Blood Pressure (mmHg): 137/83 Reference Range: 80 - 120 mg / dl Electronic Signature(s) Signed: 04/13/2020 5:47:32  PM By: Kela Millin Entered By: Kela Millin on 04/13/2020 16:13:09

## 2020-04-13 NOTE — Progress Notes (Addendum)
[name], [name] ([id number]) Visit Report for 04/13/2020 Chief Complaint Document Details Patient Name: Date of Service: [name], [name] 04/13/2020 3:45 PM Medical Record Number: [id number] Patient Account Number: [id number] Date of Birth/Sex: Treating RN: Sep 26, 1960 (59 y.o. [name] Standard Primary Care Provider: Docia Chuck, [place] Other Clinician: Referring Provider: Treating Provider/Extender: Richardo Priest, [place] Weeks in Treatment: 28 Information Obtained from: Patient Chief Complaint Left LE Ulcers Electronic Signature(s) Signed: 04/13/2020 4:10:43 PM By: Lenda Kelp PA-C Entered By: Lenda Kelp on 04/13/2020 16:10:42 -------------------------------------------------------------------------------- Cellular or Tissue Based Product Details Patient Name: Date of Service: [name], [name] 04/13/2020 3:45 PM Medical Record Number: [id number] Patient Account Number: [id number] Date of Birth/Sex: Treating RN: 1961-03-01 (59 y.o. [name] Standard Primary Care Provider: Docia Chuck, [place] Other Clinician: Referring Provider: Treating Provider/Extender: Richardo Priest, [place] Weeks in Treatment: 28 Cellular or Tissue Based Product Type Wound #2 Left,Anterior Lower Leg Applied to: Performed By: Physician Lenda Kelp, PA Cellular or Tissue Based Product Type: Apligraf Level of Consciousness (Pre-procedure): Awake and Alert Pre-procedure Verification/Time Out Yes - 17:20 Taken: Location: trunk / arms / legs Wound Size (sq cm): 6.72 Product Size (sq cm): 42 Waste Size (sq cm): 22 Waste Reason: wound size Amount of Product Applied (sq cm): 20 Instrument Used: Scissors Lot #: [id number] Order #: 5 Expiration Date: 04/21/2020 Fenestrated: Yes Instrument: Blade Reconstituted: Yes Solution Type: saline Solution Amount: 10 ml Lot #: [id number] Solution Expiration Date: 05/03/2021 Secured: Yes Secured With: Steri-Strips Dressing Applied: Yes Primary  Dressing: adaptic, drawtex Procedural Pain: 0 Post Procedural Pain: 0 Response to Treatment: Procedure was tolerated well Level of Consciousness (Post- Awake and Alert procedure): Post Procedure Diagnosis Same as Pre-procedure Electronic Signature(s) Signed: 04/13/2020 6:14:05 PM By: Zenaida Deed RN, BSN Signed: 04/14/2020 10:06:45 AM By: Lenda Kelp PA-C Entered By: Zenaida Deed on 04/13/2020 17:26:24 -------------------------------------------------------------------------------- Cellular or Tissue Based Product Details Patient Name: Date of Service: [name] 04/13/2020 3:45 PM Medical Record Number: [id number] Patient Account Number: [id number] Date of Birth/Sex: Treating RN: 1961/03/13 (59 y.o. [name] Standard Primary Care Provider: Docia Chuck, [place] Other Clinician: Referring Provider: Treating Provider/Extender: Richardo Priest, [place] Weeks in Treatment: 28 Cellular or Tissue Based Product Type Wound #4 Left,Proximal,Anterior Lower Leg Applied to: Performed By: Physician Lenda Kelp, PA Cellular or Tissue Based Product Type: Apligraf Level of Consciousness (Pre-procedure): Awake and Alert Pre-procedure Verification/Time Out Yes - 17:20 Taken: Location: trunk / arms / legs Wound Size (sq cm): 0.35 Product Size (sq cm): 2 Waste Size (sq cm): 0 Amount of Product Applied (sq cm): 2 Instrument Used: Scissors Lot #: [id number] Order #: 5 Expiration Date: 04/21/2020 Fenestrated: Yes Instrument: Blade Reconstituted: Yes Solution Type: saline Solution Amount: 10 ml Lot #: [id number] Solution Expiration Date: 05/03/2021 Secured: Yes Secured With: Steri-Strips Dressing Applied: Yes Primary Dressing: adaptic, drawtex Procedural Pain: 0 Post Procedural Pain: 0 Response to Treatment: Procedure was tolerated well Level of Consciousness (Post- Awake and Alert procedure): Post Procedure Diagnosis Same as Pre-procedure Electronic  Signature(s) Signed: 04/13/2020 6:14:05 PM By: Zenaida Deed RN, BSN Signed: 04/14/2020 10:06:45 AM By: Lenda Kelp PA-C Entered By: Zenaida Deed on 04/13/2020 17:40:36 -------------------------------------------------------------------------------- Debridement Details Patient Name: Date of Service: [name] 04/13/2020 3:45 PM Medical Record Number: [id number] Patient Account Number: [id number] Date of Birth/Sex: Treating RN: [birth year] (59 y.o. [name] Standard Primary Care Provider: Docia Chuck, [place] Other Clinician: Referring Provider: Treating Provider/Extender: Richardo Priest, [place] Weeks in Treatment: 28 Debridement  Performed for Assessment: Wound #4 Left,Proximal,Anterior Lower Leg Performed By: Physician Lenda Kelp, PA Debridement Type: Debridement Severity of Tissue Pre Debridement: Fat layer exposed Level of Consciousness (Pre-procedure): Awake and Alert Pre-procedure Verification/Time Out Yes - 17:15 Taken: Start Time: 17:15 Pain Control: Other : benzocaine 20% spray T Area Debrided (L x W): otal 0.7 (cm) x 0.5 (cm) = 0.35 (cm) Tissue and other material debrided: Viable, Non-Viable, Slough, Subcutaneous, Slough Level: Skin/Subcutaneous Tissue Debridement Description: Excisional Instrument: Curette Bleeding: Minimum Hemostasis Achieved: Pressure End Time: 17:18 Procedural Pain: 3 Post Procedural Pain: 1 Response to Treatment: Procedure was tolerated well Level of Consciousness (Post- Awake and Alert procedure): Post Debridement Measurements of Total Wound Length: (cm) 0.7 Width: (cm) 0.5 Depth: (cm) 0.5 Volume: (cm) 0.137 Character of Wound/Ulcer Post Debridement: Improved Severity of Tissue Post Debridement: Fat layer exposed Post Procedure Diagnosis Same as Pre-procedure Electronic Signature(s) Signed: 04/13/2020 6:14:05 PM By: Zenaida Deed RN, BSN Signed: 04/14/2020 10:06:45 AM By: Lenda Kelp PA-C Entered By:  Zenaida Deed on 04/13/2020 17:22:19 -------------------------------------------------------------------------------- HPI Details Patient Name: Date of Service: [name] 04/13/2020 3:45 PM Medical Record Number: [id number] Patient Account Number: [id number] Date of Birth/Sex: Treating RN: August 13, 1961 (59 y.o. [name] Standard Primary Care Provider: Docia Chuck, [place] Other Clinician: Referring Provider: Treating Provider/Extender: Richardo Priest, [place] Weeks in Treatment: 28 History of Present Illness HPI Description: 09/30/2019 upon evaluation today patient presents for initial inspection here in our clinic concerning issues that she has been experiencing with a wound on her left anterior lower leg. She unfortunately had a significant laceration which required 19 sutures and she shows me the pictures both before as well as after sutured in a very good job was done in this regard. Unfortunately the skin did not take and necrosis. Subsequently this wound open and she has been having more significant issues with that since. Fortunately there is no evidence of active infection at this time which is good news. No fever chills noted. She has a history of chronic venous stasis but is no longer able to wear any compression in fact she cannot even wear regular socks due to her neuropathy and the pain that she experiences in her feet if she does. For that reason she tries to elevate her legs as much as she can at nighttime but again she really cannot wear any type of compression. With regard to antibiotic it does not appear that she has been on anything recently. 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 Fortunately there is no evidence of local or systemic infection. No fevers,  chills, nausea, vomiting, or diarrhea. 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 10/07/2019 on evaluation today patient presents for follow-up concerning her wound on the left lower extremity. She has been tolerating the dressing changes without complication. With that being said she just got the Santyl 2 days ago. Obviously it has not had enough time to really do what we need to do she would like to obviously give this some time before proceeding with more aggressive debridement or otherwise. 10/21/2019 upon evaluation today patient appears to be doing better with regard to the overall appearance of her wound is not  quite as dry I do believe the Santyl has helped her improve to some degree here. Fortunately there is no signs of active infection at this time. I think we may be able to clean off the wound with a little bit of additional debridement today and likely we can transition away from the Santyl to some kind of collagen type dressing. 10/28/2019 upon evaluation today patient actually appears to be doing fairly well with regard to her wound today. She has been tolerating the dressing changes we did switch to collagen and fortunately that seems to be doing well for her at this point. There does not seem to be any signs of active infection I do feel like the surface of the wound is looking better at this time as well. 11/04/2019 upon evaluation today patient appears to be doing well with regard to her wound. She is making some progress here and the wound does appear to be measuring smaller which is good news. Overall this is good to be somewhat slow but nonetheless I am encouraged by the fact that the collagen does seem to have helped her. 11/11/2019 on evaluation today patient's wound is showing some signs of improvement. The good news is her overall trend does seem to be towards getting better compared to where she was previous. The smaller wounds along the side  actually might even be healed but we can watch this 1 more week before I heal this out. Overall I feel like there is improvement here. She is still using the silver collagen. 11/18/2019 upon evaluation today patient appears to be doing about the same with regard to her wound. This is not significantly smaller there still appears to be a lot of inflammation. For that reason I do want to see about actually applying triamcinolone to the actual wound bed to see if this can be of benefit as well. I thought about this for couple weeks but I think it may be a good thing to proceed with at this point. 11/25/2019 on evaluation today patient appears to be doing well with regard to her lower extremity ulcer. She has been tolerating the dressing changes without complication. Fortunately there is no signs of active infection at this time. No fevers, chills, nausea, vomiting, or diarrhea. 12/02/2019 on evaluation today patient appears to be doing okay with regard to her ulcer on the leg. Fortunately there is no signs of active infection at this time. No fever chills noted she has been tolerating the dressing changes without complication. With that being said overall I am pleased with the fact that there is no signs of infection but at the same time I really feel like she needs more to progress moving week to week and right now were really not seeing that. We have put in for approval for Apligraf which I think could help to move things along. With that being said we have not heard anything regarding approval at this point. 12/10/2019; patient comes in on my day although have not seen her previously. She has what was a traumatic wound on the left anterior mid tibia. Initially sutured however the area never really healed according to the patient. She has been using Santyl now using Prisma. There is exposed tendon. We have made application for Apligraf apparently this is going through [employer] still has not been  approved. She arrives in clinic with erythema around the wound and 4 small circular papules superior to the wound. These are nontender and she is not complaining  of any more pain. The exact cause of this is not really clear. The patient has idiopathic peripheral neuropathy which for which she has been extensively evaluated in the past she is not a diabetic she does not have any rheumatologic problems she is aware of. She cannot wear compression because it aggravates her neuropathy 4/15; traumatic wound on the left anterior mid tibia. She arrives today with extensive erythema spreading from the wound medially towards her heel. This is tender swollen and warm compatible with cellulitis. We are using silver collagen to the wound 4/20; 5-day follow-up. Traumatic wound on the left anterior mid tibia. This is in follow-up for the cellulitis she had on her visit 5 days ago. I gave her empiric cephalexin I did not culture this. She arrives today with the erythema that we marked much improved. She has not been systemically unwell Upon evaluation today patient's wound unfortunately is not doing very well this in fact is somewhat deeper than even last week4/28/2021 which is unfortunate. I am very concerned about the fact that to be honest that the patient does not seem to be doing as good as we would have expected in the past 2 weeks since I last saw her. Fortunately there is no evidence of active infection systemically which is good news. No fevers, chills, nausea, vomiting, or diarrhea. She has been on Keflex. 01/06/2020 upon evaluation today patient unfortunately appears to still be doing somewhat poorly overall in regard to her ulcer. There is definite signs of infection. She has been on Keflex in April but unfortunately this just does not seem to be doing as well as we would like. I do believe that we will get a need to place her on Augmentin in order to try to get this better. She tested positive for Proteus  and E. coli on the culture and is allergic to doxycycline. The patient's wound currently is not looking healthy at all to me. 01/27/2020 upon evaluation today patient appears to be doing maybe slightly better with regard to the wound bed at this point. Her infection is definitely improved which is great news. As far as the Apligraf I do not believe the wound surface is quite clean enough yet in order for [place] to apply the Apligraf. Another potential option would be for a snap VAC to be used under the Curlex and Coban wrap working to try the wrap today. If we can get rid of some of the fluid I think this will help her with healing also think the snap VAC could be beneficial in this regard. Nonetheless again I am not given up on the Apligraf is a possibility I just do not want a waste the applications by putting it on before the wound surface is ready. Potentially the snap VAC can get this wound to the point where it is ready much more quickly. 02/03/2020 upon evaluation today patient appears to be doing excellent with regard to her leg ulcer compared to where things have been. The overall quality of the wound bed is significantly improved and very pleased in this regard. We did get approval for the snap VAC which I am wanting to go ahead and see about initiating at this point. We also have approval for the Apligraf when and if the time is appropriate for this. With that being said I really think using the snap VAC to try to get this to fill-in would be appropriate to begin with. The patient is in agreement with that plan. I  do believe the compression did help her and she states that that was uncomfortable she was able to manage with that for now. 02/10/2020 upon evaluation today patient appears to be doing better with regard to her original and largest wound. 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 I explained that pyoderma is incompletely understood and obviously very difficult sometimes to treat but often steroid therapy is the mainstay of treatment. I think topical steroid treatment is appropriate at this point. She does have mometasone which has been prescribed previously by her primary care provider and she has that with her so we can use that on some of the satellite areas not on the main wound. The patient is in agreement with that plan. 02/17/2020 upon evaluation today patient appears to be doing well with regard to her wound. She is making good progress with the snap VAC actually believe that she is in a good position for [place] to be able to continue the snap VAC but actually at the Apligraf to try to speed up the granulation and epithelial growth. The patient is actually in agreement with that she wants to try to get this healed as quickly as possible I completely agree with her. She did fill up the canister again today. 03/02/2020 upon evaluation today patient appears to be doing well at this time with regard to her wound. In fact this is dramatically improved with the use of the snap VAC and the Apligraf even since the last time I saw her 2 weeks ago. Again week she had a nurse visit and changed out the snap VAC last week. Overall I think that she is definitely in a much better place and this wound is definitely making progress measuring smaller and overall I think progressing quite well. 7/13; wound surface looks very healthy. She has a small superior satellite lesion as well. Apligraf applied with a snap VAC. 03/30/2020 on evaluation today patient presents for follow-up concerning her lower extremity ulcers. Fortunately there is no signs of active infection at this time which is great news she has been tolerating the dressing changes without complication. Fortunately there is no evidence of anything  really worsening the smaller wound more proximal does have some slough noted that is can require sharp debridement today however the main wound for which we have been applying Apligraf and the wound VAC seems to be doing quite well. I am extremely pleased with where things stand today. 04/13/2020 upon evaluation today patient appears to be doing much better in regard to the larger of her 2 wounds. I think we do not even really need the snap VAC any longer at this point. With regard to the smaller wound I think that this is good to be something that will continue to be beneficial as far as using the snap VAC on is concerned. We are going to reapply the Apligraf today. Electronic Signature(s) Signed: 04/13/2020 6:21:52 PM By: Lenda Kelp PA-C Entered By: Lenda Kelp on 04/13/2020 18:21:52 -------------------------------------------------------------------------------- Physical Exam Details Patient Name: Date of Service: [name], [name] 04/13/2020 3:45 PM Medical Record Number: [id number] Patient Account Number: [id number] Date of Birth/Sex: Treating RN: Oct 16, 1960 (59 y.o. [name] Standard Primary Care Provider: Docia Chuck, [place] Other Clinician: Referring Provider: Treating Provider/Extender: Lenda Kelp  [place], [place] Weeks in Treatment: 28 Constitutional Well-nourished and well-hydrated in no acute distress. Respiratory normal breathing without difficulty. Psychiatric this patient is able to make decisions and demonstrates good insight into disease process. Alert and Oriented x 3. pleasant and cooperative. Notes Upon evaluation today I did perform some debridement in regard to the smaller/proximal wound and the patient tolerated that without any pain. Post debridement I did apply the Apligraf to this location and then subsequently the snap VAC no contact layer used at this point. In regard to the larger distal wound at the Apligraf was applied only no snap VAC applied.  Hopefully this will continue to help the wound improve as it has up to this point I think she has done extremely well. Electronic Signature(s) Signed: 04/13/2020 6:22:32 PM By: Lenda Kelp PA-C Entered By: Lenda Kelp on 04/13/2020 18:22:32 -------------------------------------------------------------------------------- Physician Orders Details Patient Name: Date of Service: [name], [name] 04/13/2020 3:45 PM Medical Record Number: [id number] Patient Account Number: [id number] Date of Birth/Sex: Treating RN: Jan 31, 1961 (59 y.o. 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 - Wed with Leonard Schwartz Return A Nurse Visit: - 1 week post apligraf and SNAP change Dressing Change Frequency Wound #2 Left,Anterior Lower Leg Do not change entire dressing for one week. - DO NOT remove adaptic Wound #4 Left,Proximal,Anterior Lower Leg Do not change entire dressing for one week. Skin Barriers/Peri-Wound Care Moisturizing lotion Wound Cleansing Wound #2 Left,Anterior Lower Leg May shower with protection. Primary Wound Dressing Wound #2 Left,Anterior Lower Leg pplication - apligraf #5 Skin Substitute A daptic - secured with steristrips Mepitel or A Wound  #4 Left,Proximal,Anterior Lower Leg pplication - apligraf #5 Skin Substitute A Secondary Dressing Wound #2 Left,Anterior Lower Leg Dry Gauze ABD pad Drawtex Negative Presssure Wound Therapy Wound #4 Left,Proximal,Anterior Lower Leg SNAP Vac to wound continuously at 141mm/hg pressure - cut drape size down to minimum to hold in place, skin prep under drape, Edema Control Kerlix and Coban - Left Lower Extremity Avoid standing for long periods of time Elevate legs to the level of the heart or above for 30 minutes daily and/or when sitting, a frequency of: - throughout the day Exercise regularly Electronic Signature(s) Signed: 04/13/2020 6:14:05 PM By: Zenaida Deed RN, BSN Signed: 04/14/2020 10:06:45 AM By: Lenda Kelp PA-C Entered By: Zenaida Deed on 04/13/2020 17:29:02 -------------------------------------------------------------------------------- Problem List Details Patient Name: Date of Service: [name] 04/13/2020 3:45 PM Medical Record Number: [id number] Patient Account Number: [id number] Date of Birth/Sex: Treating RN: 01-15-1961 (59 y.o. [name] Standard Primary Care Provider: Docia Chuck, [place] Other Clinician: Referring Provider: Treating Provider/Extender: Richardo Priest, [place] Weeks in Treatment: 28 Active Problems ICD-10 Encounter Code Description Active Date MDM Diagnosis I87.2 Venous insufficiency (chronic) (peripheral) 09/30/2019 No Yes L97.822 Non-pressure chronic ulcer of other part of left lower leg with fat layer exposed 09/30/2019 No Yes L03.116 Cellulitis of left lower limb 12/17/2019 No  Yes L97.828 Non-pressure chronic ulcer of other part of left lower leg with other specified 09/30/2019 No Yes severity L97.322 Non-pressure chronic ulcer of left ankle with fat layer exposed 02/03/2020 No Yes 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 09/30/2019 No Yes I73.00 Raynaud's syndrome without gangrene 09/30/2019 No Yes G60.9 Hereditary and  idiopathic neuropathy, unspecified 09/30/2019 No Yes Inactive Problems Resolved Problems Electronic Signature(s) Signed: 04/13/2020 4:10:36 PM By: Lenda Kelp PA-C Entered By: Lenda Kelp on 04/13/2020 16:10:35 -------------------------------------------------------------------------------- Progress Note Details Patient Name: Date of Service: [name] 04/13/2020 3:45 PM Medical Record Number: [id number] Patient Account Number: [id number] Date of Birth/Sex: Treating RN: [birth year] (59 y.o. [name] Standard Primary Care Provider: Docia Chuck, [place] Other Clinician: Referring Provider: Treating Provider/Extender: Richardo Priest, [place] Weeks in Treatment: 28 Subjective Chief Complaint Information obtained from Patient Left LE Ulcers History of Present Illness (HPI) 09/30/2019 upon evaluation today patient presents for initial inspection here in our clinic concerning issues that she has been experiencing with a wound on her left anterior lower leg. She unfortunately had a significant laceration which required 19 sutures and she shows me the pictures both before as well as after sutured in a very good job was done in this regard. Unfortunately the skin did not take and necrosis. Subsequently this wound open and she has been having more significant issues with that since. Fortunately there is no evidence of active infection at this time which is good news. No fever chills noted. She has a history of chronic venous stasis but is no longer able to wear any compression in fact she cannot even wear regular socks due to her neuropathy and the pain that she experiences in her feet if she does. For that reason she tries to elevate her legs as much as she can at nighttime but again she really cannot wear any type of compression. With regard to antibiotic it does not appear that she has been on anything recently. 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 Fortunately there is no evidence of local or systemic infection. No fevers, chills, nausea, vomiting, or diarrhea. 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 10/07/2019 on evaluation today patient presents for follow-up concerning her wound on the left lower extremity. She has been tolerating the dressing changes without complication. With that being said she just got the Santyl 2 days ago. Obviously it has not had enough time to really do what we need to do she would like to obviously give this some time before proceeding with more aggressive debridement or otherwise. 10/21/2019 upon evaluation today patient appears to be doing better with regard to the overall appearance of her wound is not quite as dry I do believe the Santyl has helped her improve to some degree here. Fortunately there is no signs of active infection at this time. I think we may be able to clean off the wound with a little bit of additional debridement today and likely we can transition away from the Santyl to some kind of collagen type dressing. 10/28/2019 upon evaluation today patient actually appears to be doing fairly well with regard to her wound today. She has been tolerating the dressing changes  we did switch to collagen and fortunately that seems to be doing well for her at this point. There does not seem to be any signs of active infection I do feel like the surface of the wound is looking better at this time as well. 11/04/2019 upon evaluation today patient appears to be doing well with regard to her wound. She is making some progress here and the wound does appear to be measuring smaller which is good news. Overall this is good to be somewhat slow but nonetheless I am encouraged by the fact that the collagen does  seem to have helped her. 11/11/2019 on evaluation today patient's wound is showing some signs of improvement. The good news is her overall trend does seem to be towards getting better compared to where she was previous. The smaller wounds along the side actually might even be healed but we can watch this 1 more week before I heal this out. Overall I feel like there is improvement here. She is still using the silver collagen. 11/18/2019 upon evaluation today patient appears to be doing about the same with regard to her wound. This is not significantly smaller there still appears to be a lot of inflammation. For that reason I do want to see about actually applying triamcinolone to the actual wound bed to see if this can be of benefit as well. I thought about this for couple weeks but I think it may be a good thing to proceed with at this point. 11/25/2019 on evaluation today patient appears to be doing well with regard to her lower extremity ulcer. She has been tolerating the dressing changes without complication. Fortunately there is no signs of active infection at this time. No fevers, chills, nausea, vomiting, or diarrhea. 12/02/2019 on evaluation today patient appears to be doing okay with regard to her ulcer on the leg. Fortunately there is no signs of active infection at this time. No fever chills noted she has been tolerating the dressing changes without complication. With that being said overall I am pleased with the fact that there is no signs of infection but at the same time I really feel like she needs more to progress moving week to week and right now were really not seeing that. We have put in for approval for Apligraf which I think could help to move things along. With that being said we have not heard anything regarding approval at this point. 12/10/2019; patient comes in on my day although have not seen her previously. She has what was a traumatic wound on the left anterior mid tibia.  Initially sutured however the area never really healed according to the patient. She has been using Santyl now using Prisma. There is exposed tendon. We have made application for Apligraf apparently this is going through [employer] still has not been approved. She arrives in clinic with erythema around the wound and 4 small circular papules superior to the wound. These are nontender and she is not complaining of any more pain. The exact cause of this is not really clear. The patient has idiopathic peripheral neuropathy which for which she has been extensively evaluated in the past she is not a diabetic she does not have any rheumatologic problems she is aware of. She cannot wear compression because it aggravates her neuropathy 4/15; traumatic wound on the left anterior mid tibia. She arrives today with extensive erythema spreading from the wound medially towards her heel. This is tender swollen and warm compatible with cellulitis.  We are using silver collagen to the wound 4/20; 5-day follow-up. Traumatic wound on the left anterior mid tibia. This is in follow-up for the cellulitis she had on her visit 5 days ago. I gave her empiric cephalexin I did not culture this. She arrives today with the erythema that we marked much improved. She has not been systemically unwell Upon evaluation today patient's wound unfortunately is not doing very well this in fact is somewhat deeper than even last week4/28/2021 which is unfortunate. I am very concerned about the fact that to be honest that the patient does not seem to be doing as good as we would have expected in the past 2 weeks since I last saw her. Fortunately there is no evidence of active infection systemically which is good news. No fevers, chills, nausea, vomiting, or diarrhea. She has been on Keflex. 01/06/2020 upon evaluation today patient unfortunately appears to still be doing somewhat poorly overall in regard to her ulcer. There is definite  signs of infection. She has been on Keflex in April but unfortunately this just does not seem to be doing as well as we would like. I do believe that we will get a need to place her on Augmentin in order to try to get this better. She tested positive for Proteus and E. coli on the culture and is allergic to doxycycline. The patient's wound currently is not looking healthy at all to me. 01/27/2020 upon evaluation today patient appears to be doing maybe slightly better with regard to the wound bed at this point. Her infection is definitely improved which is great news. As far as the Apligraf I do not believe the wound surface is quite clean enough yet in order for us to apply the Apligraf. Another potential option would be for a snap VAC to be used under the Curlex and Coban wrap working to try the wrap today. If we can get rid of some of the fluid I think this will help her with healing also think the snap VAC could be beneficial in this regard. Nonetheless again I am not given up on the Apligraf is a possibility I just do not want a waste the applications by putting it on before the wound surface is ready. Potentially the snap VAC can get this wound to the point where it is ready much more quickly. 02/03/2020 upon evaluation today patient appears to be doing excellent with regard to her leg ulcer compared to where things have been. The overall quality of the wound bed is significantly improved and very pleased in this regard. We did get approval for the snap VAC which I am wanting to go ahead and see about initiating at this point. We also have approval for the Apligraf when and if the time is appropriate for this. With that being said I really think using the snap VAC to try to get this to fill-in would be appropriate to begin with. The patient is in agreement with that plan. I do believe the compression did help her and she states that that was uncomfortable she was able to manage with that for  now. 02/10/2020 upon evaluation today patient appears to be doing better with regard to her original and largest wound. 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  I explained that pyoderma is incompletely understood and obviously very difficult sometimes to treat but often steroid therapy is the mainstay of treatment. I think topical steroid treatment is appropriate at this point. She does have mometasone which has been prescribed previously by her primary care provider and she has that with her so we can use that on some of the satellite areas not on the main wound. The patient is in agreement with that plan. 02/17/2020 upon evaluation today patient appears to be doing well with regard to her wound. She is making good progress with the snap VAC actually believe that she is in a good position for [place] to be able to continue the snap VAC but actually at the Apligraf to try to speed up the granulation and epithelial growth. The patient is actually in agreement with that she wants to try to get this healed as quickly as possible I completely agree with her. She did fill up the canister again today. 03/02/2020 upon evaluation today patient appears to be doing well at this time with regard to her wound. In fact this is dramatically improved with the use of the snap VAC and the Apligraf even since the last time I saw her 2 weeks ago. Again week she had a nurse visit and changed out the snap VAC last week. Overall I think that she is definitely in a much better place and this wound is definitely making progress measuring smaller and overall I think progressing quite well. 7/13; wound surface looks very healthy. She has a small superior satellite lesion as well. Apligraf applied with a snap VAC. 03/30/2020 on evaluation today  patient presents for follow-up concerning her lower extremity ulcers. Fortunately there is no signs of active infection at this time which is great news she has been tolerating the dressing changes without complication. Fortunately there is no evidence of anything really worsening the smaller wound more proximal does have some slough noted that is can require sharp debridement today however the main wound for which we have been applying Apligraf and the wound VAC seems to be doing quite well. I am extremely pleased with where things stand today. 04/13/2020 upon evaluation today patient appears to be doing much better in regard to the larger of her 2 wounds. I think we do not even really need the snap VAC any longer at this point. With regard to the smaller wound I think that this is good to be something that will continue to be beneficial as far as using the snap VAC on is concerned. We are going to reapply the Apligraf today. Objective Constitutional Well-nourished and well-hydrated in no acute distress. Vitals Time Taken: 4:10 PM, Height: 66 in, Weight: 156 lbs, BMI: 25.2, Temperature: 98.2 F, Pulse: 69 bpm, Respiratory Rate: 17 breaths/min, Blood Pressure: 137/83 mmHg. Respiratory normal breathing without difficulty. Psychiatric this patient is able to make decisions and demonstrates good insight into disease process. Alert and Oriented x 3. pleasant and cooperative. General Notes: Upon evaluation today I did perform some debridement in regard to the smaller/proximal wound and the patient tolerated that without any pain. Post debridement I did apply the Apligraf to this location and then subsequently the snap VAC no contact layer used at this point. In regard to the larger distal wound at the Apligraf was applied only no snap VAC applied. Hopefully this will continue to help the wound improve as it has up to this point I think she has done extremely well. Integumentary (Hair, Skin)  Wound #2  status is Open. Original cause of wound was Trauma. The wound is located on the Left,Anterior Lower Leg. The wound measures 3.2cm length x 2.1cm width x 0.2cm depth; 5.278cm^2 area and 1.056cm^3 volume. There is Fat Layer (Subcutaneous Tissue) Exposed exposed. There is no tunneling or undermining noted. There is a medium amount of serosanguineous drainage noted. The wound margin is distinct with the outline attached to the wound base. There is large (67-100%) red, pink granulation within the wound bed. There is no necrotic tissue within the wound bed. Wound #4 status is Open. Original cause of wound was Gradually Appeared. The wound is located on the Left,Proximal,Anterior Lower Leg. The wound measures 0.7cm length x 0.5cm width x 0.5cm depth; 0.275cm^2 area and 0.137cm^3 volume. There is Fat Layer (Subcutaneous Tissue) Exposed exposed. There is no tunneling noted, however, there is undermining starting at 11:00 and ending at 7:00 with a maximum distance of 0.4cm. There is a medium amount of serosanguineous drainage noted. The wound margin is well defined and not attached to the wound base. There is medium (34-66%) pink granulation within the wound bed. There is a medium (34-66%) amount of necrotic tissue within the wound bed including Adherent Slough. 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 There was a Excisional Skin/Subcutaneous Tissue Debridement with a total area of 0.35 sq cm performed by Lenda Kelp, PA. With the following instrument(s): Curette to remove Viable and Non-Viable tissue/material. Material removed includes Subcutaneous Tissue and Slough and after achieving pain control using Other (benzocaine 20% spray). No specimens were taken. A time out was conducted at 17:15, prior to the start of the procedure. A Minimum amount of bleeding was controlled with Pressure. The procedure was tolerated well with a pain level of 3 throughout and a pain level of 1 following the procedure. Post Debridement Measurements: 0.7cm length x 0.5cm width x 0.5cm depth; 0.137cm^3 volume. Character of Wound/Ulcer Post Debridement is improved. Severity of Tissue Post Debridement is: Fat layer exposed. Post procedure Diagnosis Wound #4: Same as Pre-Procedure Pre-procedure diagnosis of Wound #4 is a Venous Leg Ulcer located on the Left,Proximal,Anterior Lower Leg. A skin graft procedure using a bioengineered skin substitute/cellular or tissue based product was performed by Lenda Kelp, PA with the following instrument(s): Scissors. Apligraf was applied and secured with Steri-Strips. 2 sq cm of product was utilized and 0 sq cm was wasted. Post Application, adaptic, drawtex was applied. A Time Out was conducted at 17:20, prior to the start of the procedure. The procedure was tolerated well with a pain level of 0 throughout and a pain level of 0 following the procedure. Post procedure Diagnosis Wound #4: Same as Pre-Procedure . Wound #2 Pre-procedure diagnosis of Wound #2 is a Venous Leg Ulcer located on the Left,Anterior Lower Leg. A skin graft procedure using a bioengineered skin substitute/cellular or tissue based product was performed by Lenda Kelp, PA with the following instrument(s): Scissors. Apligraf was applied and secured with Steri-Strips. 20 sq cm  of product was  utilized and 22 sq cm was wasted due to wound size. Post Application, adaptic, drawtex was applied. A Time Out was conducted at 17:20, prior to the start of the procedure. The procedure was tolerated well with a pain level of 0 throughout and a pain level of 0 following the procedure. Post procedure Diagnosis Wound #2: Same as Pre-Procedure . Plan Follow-up Appointments: Return Appointment in 2 weeks. - Wed with Leonard Schwartz Nurse Visit: - 1 week post apligraf and SNAP change Dressing Change Frequency: Wound #2 Left,Anterior Lower Leg: Do not change entire dressing for one week. - DO NOT remove adaptic Wound #4 Left,Proximal,Anterior Lower Leg: Do not change entire dressing for one week. Skin Barriers/Peri-Wound Care: Moisturizing lotion Wound Cleansing: Wound #2 Left,Anterior Lower Leg: May shower with protection. Primary Wound Dressing: Wound #2 Left,Anterior Lower Leg: Skin Substitute Application - apligraf #5 Mepitel or Adaptic - secured with steristrips Wound #4 Left,Proximal,Anterior Lower Leg: Skin Substitute Application - apligraf #5 Secondary Dressing: Wound #2 Left,Anterior Lower Leg: Dry Gauze ABD pad Drawtex Negative Presssure Wound Therapy: Wound #4 Left,Proximal,Anterior Lower Leg: SNAP Vac to wound continuously at 110mm/hg pressure - cut drape size down to minimum to hold in place, skin prep under drape, Edema Control: Kerlix and Coban - Left Lower Extremity Avoid standing for long periods of time Elevate legs to the level of the heart or above for 30 minutes daily and/or when sitting, a frequency of: - throughout the day Exercise regularly 1. I would recommend currently that we go ahead and continue with the Apligraf to both wounds as that has done extremely well that was applied today. 2. I am also can recommend we discontinue the snap VAC to the larger inferior wound that we will use this on the proximal wound to try to get this to feeling that this is a very punched  out lesion. 3. I am also can recommend the patient continue with elevation is much as possible for her to be using a Curlex and Coban wrap as before. We will see patient back for reevaluation in 1 week here in the clinic. If anything worsens or changes patient will contact our office for additional recommendations. This will be a nurse visit and then subsequently I will see her in 2 weeks for a provider visit. Electronic Signature(s) Signed: 04/13/2020 6:23:24 PM By: Lenda Kelp PA-C Entered By: Lenda Kelp on 04/13/2020 18:23:24 -------------------------------------------------------------------------------- SuperBill Details Patient Name: Date of Service: [name] 04/13/2020 Medical Record Number: [id number] Patient Account Number: [id number] Date of Birth/Sex: Treating RN: Mar 26, 1961 (59 y.o. [name] Standard Primary Care Provider: Docia Chuck, [place] Other Clinician: Referring Provider: Treating Provider/Extender: Richardo Priest, [place] Weeks in Treatment: 28 Diagnosis Coding ICD-10 Codes Code Description I87.2 Venous insufficiency (chronic) (peripheral) L97.822 Non-pressure chronic ulcer of other part of left lower leg with fat layer exposed L03.116 Cellulitis of left lower limb L97.828 Non-pressure chronic ulcer of other part of left lower leg with other specified severity L97.322 Non-pressure chronic ulcer of left ankle with fat layer exposed 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 I73.00 Raynaud's syndrome without gangrene 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, unspecified Facility Procedures CPT4 Code: 40981191 Description: (Facility Use Only) Apligraf 1 SQ CM Modifier: Quantity: 44 CPT4 Code: 47829562 Description: 15271 - SKIN SUB GRAFT TRNK/ARM/LEG ICD-10 Diagnosis Description L97.822 Non-pressure chronic ulcer of other part of left lower leg with fat layer expos Modifier: ed Quantity: 1 Physician Procedures : CPT4 Code Description  Modifier [id number] -  WC PHYS SKIN SUB GRAFT TRNK/ARM/LEG ICD-10 Diagnosis Description L97.822 Non-pressure chronic ulcer of other part of left lower leg with fat layer exposed Quantity: 1 Electronic Signature(s) Signed: 04/13/2020 6:14:05 PM By: Zenaida Deed RN, BSN Signed: 04/14/2020 10:06:45 AM By: Lenda Kelp PA-C Entered By: Zenaida Deed on 04/13/2020 17:43:04

## 2020-04-20 ENCOUNTER — Encounter (HOSPITAL_BASED_OUTPATIENT_CLINIC_OR_DEPARTMENT_OTHER): Payer: Worker's Compensation | Admitting: Physician Assistant

## 2020-04-20 DIAGNOSIS — L97822 Non-pressure chronic ulcer of other part of left lower leg with fat layer exposed: Secondary | ICD-10-CM | POA: Diagnosis not present

## 2020-04-27 ENCOUNTER — Encounter (HOSPITAL_BASED_OUTPATIENT_CLINIC_OR_DEPARTMENT_OTHER): Payer: Worker's Compensation | Admitting: Physician Assistant

## 2020-04-27 DIAGNOSIS — L97822 Non-pressure chronic ulcer of other part of left lower leg with fat layer exposed: Secondary | ICD-10-CM | POA: Diagnosis not present

## 2020-04-27 NOTE — Progress Notes (Addendum)
[name], [name] ([id number]) Visit Report for 04/27/2020 Chief Complaint Document Details Patient Name: Date of Service: [name], [name] 04/27/2020 8:00 A M Medical Record Number: [id number] Patient Account Number: [id number] Date of Birth/Sex: Treating RN: [birth year] (59 y.o. [name] Primary Care Provider: Docia Chuck, [place] Other Clinician: Referring Provider: Treating Provider/Extender: Richardo Priest, [place] Weeks in Treatment: 30 Information Obtained from: Patient Chief Complaint Left LE Ulcers Electronic Signature(s) Signed: 04/27/2020 8:57:00 AM By: Lenda Kelp PA-C Entered By: Lenda Kelp on 04/27/2020 08:56:59 -------------------------------------------------------------------------------- HPI Details Patient Name: Date of Service: Cheyenne Aran B. 04/27/2020 8:00 A M Medical Record Number: [id number] Patient Account Number: [id number] Date of Birth/Sex: Treating RN: [birth year] (59 y.o. [name] Primary Care Provider: Docia Chuck, [place] Other Clinician: Referring Provider: Treating Provider/Extender: Richardo Priest, [place] Weeks in Treatment: 30 History of Present Illness HPI Description: 09/30/2019 upon evaluation today patient presents for initial inspection here in our clinic concerning issues that she has been experiencing with a wound on her left anterior lower leg. She unfortunately had a significant laceration which required 19 sutures and she shows me the pictures both before as well as after sutured in a very good job was done in this regard. Unfortunately the skin did not take and necrosis. Subsequently this wound open and she has been having more significant issues with that since. Fortunately there is no evidence of active infection at this time which is good news. No fever chills noted. She has a history of chronic venous stasis but is no longer able to wear any compression in fact she cannot even wear regular socks due to  her neuropathy and the pain that she experiences in her feet if she does. For that reason she tries to elevate her legs as much as she can at nighttime but again she really cannot wear any type of compression. With regard to antibiotic it does not appear that she has been on anything recently. 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 Fortunately there is no evidence of local or systemic infection. No fevers, chills, nausea, vomiting, or diarrhea. 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 10/07/2019 on evaluation today patient presents for follow-up concerning her wound on the left lower extremity. She has been tolerating the dressing changes without complication. With that being said she just got the Santyl 2 days ago. Obviously it has not had enough time to really do what we need to do she would like to obviously give this some time before proceeding with more aggressive debridement or otherwise. 10/21/2019 upon evaluation today patient appears to be doing better with regard to the overall appearance of her wound is not quite as dry I do believe the Santyl has helped her improve to some degree here. Fortunately there is no signs of active infection at this time. I think we may be able to clean off the wound with a little bit of additional debridement today and likely we can transition away from the Santyl to some kind of collagen type dressing. 10/28/2019 upon evaluation today patient actually appears to be doing fairly well with regard to her wound today. She has been tolerating the dressing changes  we did switch to collagen and fortunately that seems to be doing well for her at this point. There does not seem to be any signs of active infection I do feel like the  surface of the wound is looking better at this time as well. 11/04/2019 upon evaluation today patient appears to be doing well with regard to her wound. She is making some progress here and the wound does appear to be measuring smaller which is good news. Overall this is good to be somewhat slow but nonetheless I am encouraged by the fact that the collagen does seem to have helped her. 11/11/2019 on evaluation today patient's wound is showing some signs of improvement. The good news is her overall trend does seem to be towards getting better compared to where she was previous. The smaller wounds along the side actually might even be healed but we can watch this 1 more week before I heal this out. Overall I feel like there is improvement here. She is still using the silver collagen. 11/18/2019 upon evaluation today patient appears to be doing about the same with regard to her wound. This is not significantly smaller there still appears to be a lot of inflammation. For that reason I do want to see about actually applying triamcinolone to the actual wound bed to see if this can be of benefit as well. I thought about this for couple weeks but I think it may be a good thing to proceed with at this point. 11/25/2019 on evaluation today patient appears to be doing well with regard to her lower extremity ulcer. She has been tolerating the dressing changes without complication. Fortunately there is no signs of active infection at this time. No fevers, chills, nausea, vomiting, or diarrhea. 12/02/2019 on evaluation today patient appears to be doing okay with regard to her ulcer on the leg. Fortunately there is no signs of active infection at this time. No fever chills noted she has been tolerating the dressing changes without complication. With that being said overall I am pleased with the fact that there is no signs of infection but at the same time I really feel like she needs more to progress moving week to week  and right now were really not seeing that. We have put in for approval for Apligraf which I think could help to move things along. With that being said we have not heard anything regarding approval at this point. 12/10/2019; patient comes in on my day although have not seen her previously. She has what was a traumatic wound on the left anterior mid tibia. Initially sutured however the area never really healed according to the patient. She has been using Santyl now using Prisma. There is exposed tendon. We have made application for Apligraf apparently this is going through [employer] still has not been approved. She arrives in clinic with erythema around the wound and 4 small circular papules superior to the wound. These are nontender and she is not complaining of any more pain. The exact cause of this is not really clear. The patient has idiopathic peripheral neuropathy which for which she has been extensively evaluated in the past she is not a diabetic she does not have any rheumatologic problems she is aware of. She cannot wear compression because it aggravates her neuropathy 4/15; traumatic wound on the left anterior mid tibia. She arrives today with extensive erythema spreading from the wound medially towards her heel. This is tender swollen and warm compatible with cellulitis.  We are using silver collagen to the wound 4/20; 5-day follow-up. Traumatic wound on the left anterior mid tibia. This is in follow-up for the cellulitis she had on her visit 5 days ago. I gave her empiric cephalexin I did not culture this. She arrives today with the erythema that we marked much improved. She has not been systemically unwell Upon evaluation today patient's wound unfortunately is not doing very well this in fact is somewhat deeper than even last week4/28/2021 which is unfortunate. I am very concerned about the fact that to be honest that the patient does not seem to be doing as good as we would have  expected in the past 2 weeks since I last saw her. Fortunately there is no evidence of active infection systemically which is good news. No fevers, chills, nausea, vomiting, or diarrhea. She has been on Keflex. 01/06/2020 upon evaluation today patient unfortunately appears to still be doing somewhat poorly overall in regard to her ulcer. There is definite signs of infection. She has been on Keflex in April but unfortunately this just does not seem to be doing as well as we would like. I do believe that we will get a need to place her on Augmentin in order to try to get this better. She tested positive for Proteus and E. coli on the culture and is allergic to doxycycline. The patient's wound currently is not looking healthy at all to me. 01/27/2020 upon evaluation today patient appears to be doing maybe slightly better with regard to the wound bed at this point. Her infection is definitely improved which is great news. As far as the Apligraf I do not believe the wound surface is quite clean enough yet in order for [place] to apply the Apligraf. Another potential option would be for a snap VAC to be used under the Curlex and Coban wrap working to try the wrap today. If we can get rid of some of the fluid I think this will help her with healing also think the snap VAC could be beneficial in this regard. Nonetheless again I am not given up on the Apligraf is a possibility I just do not want a waste the applications by putting it on before the wound surface is ready. Potentially the snap VAC can get this wound to the point where it is ready much more quickly. 02/03/2020 upon evaluation today patient appears to be doing excellent with regard to her leg ulcer compared to where things have been. The overall quality of the wound bed is significantly improved and very pleased in this regard. We did get approval for the snap VAC which I am wanting to go ahead and see about initiating at this point. We also have approval  for the Apligraf when and if the time is appropriate for this. With that being said I really think using the snap VAC to try to get this to fill-in would be appropriate to begin with. The patient is in agreement with that plan. I do believe the compression did help her and she states that that was uncomfortable she was able to manage with that for now. 02/10/2020 upon evaluation today patient appears to be doing better with regard to her original and largest wound. 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  I explained that pyoderma is incompletely understood and obviously very difficult sometimes to treat but often steroid therapy is the mainstay of treatment. I think topical steroid treatment is appropriate at this point. She does have mometasone which has been prescribed previously by her primary care provider and she has that with her so we can use that on some of the satellite areas not on the main wound. The patient is in agreement with that plan. 02/17/2020 upon evaluation today patient appears to be doing well with regard to her wound. She is making good progress with the snap VAC actually believe that she is in a good position for [place] to be able to continue the snap VAC but actually at the Apligraf to try to speed up the granulation and epithelial growth. The patient is actually in agreement with that she wants to try to get this healed as quickly as possible I completely agree with her. She did fill up the canister again today. 03/02/2020 upon evaluation today patient appears to be doing well at this time with regard to her wound. In fact this is dramatically improved with the use of the snap VAC and the Apligraf even since the last time I saw her 2 weeks ago. Again week she had a nurse visit and changed  out the snap VAC last week. Overall I think that she is definitely in a much better place and this wound is definitely making progress measuring smaller and overall I think progressing quite well. 7/13; wound surface looks very healthy. She has a small superior satellite lesion as well. Apligraf applied with a snap VAC. 03/30/2020 on evaluation today patient presents for follow-up concerning her lower extremity ulcers. Fortunately there is no signs of active infection at this time which is great news she has been tolerating the dressing changes without complication. Fortunately there is no evidence of anything really worsening the smaller wound more proximal does have some slough noted that is can require sharp debridement today however the main wound for which we have been applying Apligraf and the wound VAC seems to be doing quite well. I am extremely pleased with where things stand today. 04/13/2020 upon evaluation today patient appears to be doing much better in regard to the larger of her 2 wounds. I think we do not even really need the snap VAC any longer at this point. With regard to the smaller wound I think that this is good to be something that will continue to be beneficial as far as using the snap VAC on is concerned. We are going to reapply the Apligraf today. 04/27/2020 on evaluation today patient appears to be doing excellent in regard to both of her wounds. The snap VAC help the smaller area to feeling quite well and overall I think were very close to getting both to close. I believe collagen may be a good option at this point. Electronic Signature(s) Signed: 04/27/2020 9:23:52 AM By: Lenda Kelp PA-C Entered By: Lenda Kelp on 04/27/2020 09:23:52 -------------------------------------------------------------------------------- Physical Exam Details Patient Name: Date of Service: JOURDEN, [name] 04/27/2020 8:00 A M Medical Record Number: [id number] Patient Account Number:  [id number] Date of Birth/Sex: Treating RN: 03-Sep-1961 (59 y.o. [name] Primary Care Provider: Docia Chuck, [place] Other Clinician: Referring Provider: Treating Provider/Extender: Richardo Priest, [place] Weeks in Treatment: 30 Constitutional Well-nourished and well-hydrated in no acute distress. Respiratory normal breathing without difficulty. Psychiatric this patient is able to make decisions and demonstrates good insight into disease process.  Alert and Oriented x 3. pleasant and cooperative. Notes Wound bed currently showed signs of good granulation at this time there does not appear to be any evidence of active infection which is great news and overall very pleased with where things stand. Electronic Signature(s) Signed: 04/27/2020 9:24:34 AM By: Lenda Kelp PA-C Entered By: Lenda Kelp on 04/27/2020 09:24:34 -------------------------------------------------------------------------------- Physician Orders Details Patient Name: Date of Service: [name] 04/27/2020 8:00 A M Medical Record Number: [id number] Patient Account Number: [id number] Date of Birth/Sex: Treating RN: 25-Jul-1961 (59 y.o. 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 Dressing Change Frequency Wound #2 Left,Anterior Lower Leg Do not change entire dressing for one week. Wound #4 Left,Proximal,Anterior Lower Leg Do not change entire dressing for one week. Skin Barriers/Peri-Wound Care Moisturizing lotion TCA Cream or Ointment - to irritated periwound skin Wound Cleansing Wound #2 Left,Anterior Lower Leg May shower with protection. Primary Wound Dressing Wound #2 Left,Anterior Lower Leg Silver Collagen Wound #4 Left,Proximal,Anterior Lower Leg Silver Collagen Secondary Dressing Wound #2 Left,Anterior Lower Leg ABD pad Drawtex Wound #4 Left,Proximal,Anterior Lower Leg ABD pad Drawtex Edema Control Kerlix and Coban - Left Lower Extremity Avoid standing for long periods of time Elevate legs to the level of the heart or above for 30 minutes daily and/or when sitting, a frequency of: - throughout the day Exercise regularly Electronic Signature(s) Signed: 04/27/2020 6:40:41 PM By: Zenaida Deed RN, BSN Signed: 04/27/2020 7:06:00 PM By: Lenda Kelp PA-C Entered By: Zenaida Deed on 04/27/2020 09:03:55 -------------------------------------------------------------------------------- Problem List Details Patient Name: Date of Service: Cheyenne Aran B. 04/27/2020 8:00 A M Medical Record Number: [id number] Patient Account Number: [id number] Date of Birth/Sex: Treating RN: 1961-04-30 (59 y.o. [name] Primary Care Provider: Docia Chuck, [place] Other Clinician: Referring Provider: Treating Provider/Extender: Richardo Priest, [place] Weeks in Treatment: 30 Active Problems ICD-10 Encounter Code Description Active Date MDM Diagnosis I87.2 Venous insufficiency (chronic) (peripheral) 09/30/2019 No Yes L97.822 Non-pressure chronic ulcer of other part of left lower leg with fat layer exposed 09/30/2019 No Yes L03.116 Cellulitis of left lower limb 12/17/2019 No Yes L97.828 Non-pressure chronic  ulcer of other part of left lower leg with other specified 09/30/2019 No Yes severity L97.322 Non-pressure chronic ulcer of left ankle with fat layer exposed 02/03/2020 No Yes 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 04/27/2020 8:00 A M Medical Record Number: [id number] Patient Account Number: [id number] Date of Birth/Sex: Treating RN: 1961-07-20 (59 y.o. [name] Primary Care Provider: Docia Chuck, [place] Other Clinician: Referring Provider: Treating Provider/Extender: Richardo Priest, [place] Weeks in Treatment: 30 Subjective Chief  Complaint Information obtained from Patient Left LE Ulcers History of Present Illness (HPI) 09/30/2019 upon evaluation today patient presents for initial inspection here in our clinic concerning issues that she has been experiencing with a wound on her left anterior lower leg. She unfortunately had a significant laceration which required 19 sutures and she shows me the pictures both before as well as after sutured in a very good job was done in this regard. Unfortunately the skin did not take and necrosis. Subsequently this wound open and she has been having more significant issues with that since. Fortunately there is no evidence of active infection at this time which is good news. No fever chills noted. She has a history of chronic venous stasis but is no longer able to wear any compression in fact she cannot even wear regular socks due to her neuropathy and the pain that she  experiences in her feet if she does. For that reason she tries to elevate her legs as much as she can at nighttime but again she really cannot wear any type of compression. With regard to antibiotic it does not appear that she has been on anything recently. 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 Fortunately there is no evidence of local or systemic infection. No fevers, chills, nausea, vomiting, or diarrhea. 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 10/07/2019 on evaluation today patient presents for follow-up concerning her wound on the left lower extremity. She has been tolerating the dressing changes without complication. With that being said she just got the Santyl 2 days ago. Obviously it has not had enough time to really do what we need to do she would like to obviously give this some time before proceeding with more aggressive debridement or otherwise. 10/21/2019 upon evaluation today patient appears to be doing better with regard to the overall appearance of her wound is not quite as dry I do believe the Santyl has helped her improve to some degree here. Fortunately there is no signs of active infection at this time. I think we may be able to clean off the wound with a little bit of additional debridement today and likely we can transition away from the Santyl to some kind of collagen type dressing. 10/28/2019 upon evaluation today patient actually appears to be doing fairly well with regard to her wound today. She has been tolerating the dressing changes we did switch to collagen and fortunately that seems to be doing well for her at this point. There does not seem to be any signs of active infection I do feel like the surface of the wound is looking better  at this time as well. 11/04/2019 upon evaluation today patient appears to be doing well with regard to her wound. She is making some progress here and the wound does appear to be measuring smaller which is good news. Overall this is good to be somewhat slow but nonetheless I am encouraged by the fact that the collagen does seem to have helped her. 11/11/2019 on evaluation today patient's wound is showing some signs of improvement. The good news is her overall trend does seem to be towards getting better compared to where she was previous.  The smaller wounds along the side actually might even be healed but we can watch this 1 more week before I heal this out. Overall I feel like there is improvement here. She is still using the silver collagen. 11/18/2019 upon evaluation today patient appears to be doing about the same with regard to her wound. This is not significantly smaller there still appears to be a lot of inflammation. For that reason I do want to see about actually applying triamcinolone to the actual wound bed to see if this can be of benefit as well. I thought about this for couple weeks but I think it may be a good thing to proceed with at this point. 11/25/2019 on evaluation today patient appears to be doing well with regard to her lower extremity ulcer. She has been tolerating the dressing changes without complication. Fortunately there is no signs of active infection at this time. No fevers, chills, nausea, vomiting, or diarrhea. 12/02/2019 on evaluation today patient appears to be doing okay with regard to her ulcer on the leg. Fortunately there is no signs of active infection at this time. No fever chills noted she has been tolerating the dressing changes without complication. With that being said overall I am pleased with the fact that there is no signs of infection but at the same time I really feel like she needs more to progress moving week to week and right now were really not seeing  that. We have put in for approval for Apligraf which I think could help to move things along. With that being said we have not heard anything regarding approval at this point. 12/10/2019; patient comes in on my day although have not seen her previously. She has what was a traumatic wound on the left anterior mid tibia. Initially sutured however the area never really healed according to the patient. She has been using Santyl now using Prisma. There is exposed tendon. We have made application for Apligraf apparently this is going through [employer] still has not been approved. She arrives in clinic with erythema around the wound and 4 small circular papules superior to the wound. These are nontender and she is not complaining of any more pain. The exact cause of this is not really clear. The patient has idiopathic peripheral neuropathy which for which she has been extensively evaluated in the past she is not a diabetic she does not have any rheumatologic problems she is aware of. She cannot wear compression because it aggravates her neuropathy 4/15; traumatic wound on the left anterior mid tibia. She arrives today with extensive erythema spreading from the wound medially towards her heel. This is tender swollen and warm compatible with cellulitis. We are using silver collagen to the wound 4/20; 5-day follow-up. Traumatic wound on the left anterior mid tibia. This is in follow-up for the cellulitis she had on her visit 5 days ago. I gave her empiric cephalexin I did not culture this. She arrives today with the erythema that we marked much improved. She has not been systemically unwell Upon evaluation today patient's wound unfortunately is not doing very well this in fact is somewhat deeper than even last week4/28/2021 which is unfortunate. I am very concerned about the fact that to be honest that the patient does not seem to be doing as good as we would have expected in the past 2 weeks since I  last saw her. Fortunately there is no evidence of active infection systemically which is good news. No fevers,  chills, nausea, vomiting, or diarrhea. She has been on Keflex. 01/06/2020 upon evaluation today patient unfortunately appears to still be doing somewhat poorly overall in regard to her ulcer. There is definite signs of infection. She has been on Keflex in April but unfortunately this just does not seem to be doing as well as we would like. I do believe that we will get a need to place her on Augmentin in order to try to get this better. She tested positive for Proteus and E. coli on the culture and is allergic to doxycycline. The patient's wound currently is not looking healthy at all to me. 01/27/2020 upon evaluation today patient appears to be doing maybe slightly better with regard to the wound bed at this point. Her infection is definitely improved which is great news. As far as the Apligraf I do not believe the wound surface is quite clean enough yet in order for [place] to apply the Apligraf. Another potential option would be for a snap VAC to be used under the Curlex and Coban wrap working to try the wrap today. If we can get rid of some of the fluid I think this will help her with healing also think the snap VAC could be beneficial in this regard. Nonetheless again I am not given up on the Apligraf is a possibility I just do not want a waste the applications by putting it on before the wound surface is ready. Potentially the snap VAC can get this wound to the point where it is ready much more quickly. 02/03/2020 upon evaluation today patient appears to be doing excellent with regard to her leg ulcer compared to where things have been. The overall quality of the wound bed is significantly improved and very pleased in this regard. We did get approval for the snap VAC which I am wanting to go ahead and see about initiating at this point. We also have approval for the Apligraf when and if the time  is appropriate for this. With that being said I really think using the snap VAC to try to get this to fill-in would be appropriate to begin with. The patient is in agreement with that plan. I do believe the compression did help her and she states that that was uncomfortable she was able to manage with that for now. 02/10/2020 upon evaluation today patient appears to be doing better with regard to her original and largest wound. 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 I explained that pyoderma is incompletely understood and obviously very difficult sometimes to treat but often steroid therapy is the mainstay of treatment. I think topical steroid treatment is appropriate at this point. She does have mometasone which has been prescribed previously by her primary care provider and she has that with her so we can use that on some of the satellite areas not on the main wound. The patient is in agreement with that plan. 02/17/2020 upon evaluation today patient appears to be doing well with regard to her wound. She is making good progress with the snap VAC actually believe that she is in a good position for [place] to be able to continue the snap VAC but actually at the Apligraf to try to speed up the granulation and epithelial growth. The patient is  actually in agreement with that she wants to try to get this healed as quickly as possible I completely agree with her. She did fill up the canister again today. 03/02/2020 upon evaluation today patient appears to be doing well at this time with regard to her wound. In fact this is dramatically improved with the use of the snap VAC and the Apligraf even since the last time I saw her 2 weeks ago. Again week she had a nurse visit and changed out the snap VAC last week. Overall I  think that she is definitely in a much better place and this wound is definitely making progress measuring smaller and overall I think progressing quite well. 7/13; wound surface looks very healthy. She has a small superior satellite lesion as well. Apligraf applied with a snap VAC. 03/30/2020 on evaluation today patient presents for follow-up concerning her lower extremity ulcers. Fortunately there is no signs of active infection at this time which is great news she has been tolerating the dressing changes without complication. Fortunately there is no evidence of anything really worsening the smaller wound more proximal does have some slough noted that is can require sharp debridement today however the main wound for which we have been applying Apligraf and the wound VAC seems to be doing quite well. I am extremely pleased with where things stand today. 04/13/2020 upon evaluation today patient appears to be doing much better in regard to the larger of her 2 wounds. I think we do not even really need the snap VAC any longer at this point. With regard to the smaller wound I think that this is good to be something that will continue to be beneficial as far as using the snap VAC on is concerned. We are going to reapply the Apligraf today. 04/27/2020 on evaluation today patient appears to be doing excellent in regard to both of her wounds. The snap VAC help the smaller area to feeling quite well and overall I think were very close to getting both to close. I believe collagen may be a good option at this point. Objective Constitutional Well-nourished and well-hydrated in no acute distress. Vitals Time Taken: 7:58 AM, Height: 66 in, Weight: 156 lbs, BMI: 25.2, Temperature: 97.6 F, Pulse: 76 bpm, Respiratory Rate: 18 breaths/min, Blood Pressure: 119/79 mmHg. Respiratory normal breathing without difficulty. Psychiatric this patient is able to make decisions and demonstrates good insight into disease  process. Alert and Oriented x 3. pleasant and cooperative. General Notes: Wound bed currently showed signs of good granulation at this time there does not appear to be any evidence of active infection which is great news and overall very pleased with where things stand. Integumentary (Hair, Skin) Wound #2 status is Open. Original cause of wound was Trauma. The wound is located on the Left,Anterior Lower Leg. The wound measures 2.9cm length x 1.5cm width x 0.1cm depth; 3.416cm^2 area and 0.342cm^3 volume. There is Fat Layer (Subcutaneous Tissue) exposed. There is no tunneling or undermining noted. There is a medium amount of serosanguineous drainage noted. The wound margin is distinct with the outline attached to the wound base. There is large (67-100%) pink granulation within the wound bed. There is a small (1-33%) amount of necrotic tissue within the wound bed including Adherent Slough. Wound #4 status is Open. Original cause of wound was Gradually Appeared. The wound is located on the Left,Proximal,Anterior Lower Leg. The wound measures 0.8cm length x 0.4cm width x 0.1cm depth; 0.251cm^2 area and 0.025cm^3 volume. There is  Fat Layer (Subcutaneous Tissue) exposed. There is no tunneling or undermining noted. There is a medium amount of serosanguineous drainage noted. The wound margin is well defined and not attached to the wound base. There is large (67-100%) pink, pale granulation within the wound bed. There is a small (1-33%) amount of necrotic tissue within the wound bed including Adherent Slough. 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 Dressing Change Frequency: Wound #2 Left,Anterior Lower Leg: Do not change entire dressing for one week. Wound #4 Left,Proximal,Anterior Lower Leg: Do not change entire dressing for one week. Skin Barriers/Peri-Wound Care: Moisturizing lotion TCA Cream or Ointment - to irritated periwound skin Wound Cleansing: Wound #2 Left,Anterior Lower Leg: May shower with protection. Primary Wound Dressing: Wound #2 Left,Anterior Lower Leg: Silver Collagen Wound #4 Left,Proximal,Anterior Lower Leg: Silver Collagen Secondary Dressing: Wound #2 Left,Anterior Lower Leg: ABD pad Drawtex Wound #4 Left,Proximal,Anterior Lower Leg: ABD pad Drawtex Edema Control: Kerlix and Coban - Left Lower Extremity Avoid standing for long periods of time Elevate legs to the level of the heart or above for 30 minutes daily and/or when sitting, a frequency of: - throughout the day Exercise regularly 1. I am going to suggest currently that we continue with the wound care measures as before specifically with regard to the Curlex and Coban wrap. I think that is helpful for her. 2. I am also can recommend we switch to a silver collagen dressing I think that will be best at this point. 3. She should still continue to elevate her legs when she is at home and resting I think that would also be beneficial for her. We will see patient back for reevaluation in 1 week here in the clinic. If anything worsens or changes patient will contact our office for additional recommendations. Electronic Signature(s) Signed: 04/27/2020 9:24:45 AM By: Lenda Kelp PA-C Entered By: Lenda Kelp on 04/27/2020 09:24:44 -------------------------------------------------------------------------------- SuperBill Details Patient Name: Date of Service: [name] 04/27/2020 Medical Record  Number: [id number] Patient Account Number: [id number] Date of Birth/Sex: Treating RN: 1960/12/12 (59 y.o. [name] Primary Care Provider: Docia Chuck, [place] Other Clinician: Referring Provider: Treating Provider/Extender: Richardo Priest, [place] Weeks in Treatment: 30 Diagnosis Coding ICD-10 Codes Code Description I87.2 Venous insufficiency (chronic) (peripheral) L97.822 Non-pressure chronic ulcer of other part of left lower leg with fat layer exposed L03.116 Cellulitis of left lower limb L97.828 Non-pressure chronic ulcer of other part of left lower leg with other specified severity L97.322 Non-pressure chronic ulcer of left ankle with fat layer exposed 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 I73.00 Raynaud's syndrome without gangrene 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, unspecified Facility Procedures CPT4 Code: 65784696 Description: 99213 - WOUND CARE VISIT-LEV 3 EST PT Modifier: Quantity: 1 Physician Procedures : CPT4 Code Description Modifier [id number] 99213 - WC PHYS LEVEL 3 - EST PT ICD-10 Diagnosis Description I87.2 Venous insufficiency (chronic) (peripheral) L97.822 Non-pressure chronic ulcer of other part of left lower leg with fat layer exposed L03.116  Cellulitis of left lower limb L97.828 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

## 2020-04-30 NOTE — Progress Notes (Signed)
[name], [name] ([id number]) Visit Report for 04/20/2020 Arrival Information Details Patient Name: Date of Service: [name], [name] 04/20/2020 8:00 A M Medical Record Number: [id number] Patient Account Number: [id number] Date of Birth/Sex: Treating RN: 1961-04-02 (59 y.o. Freddy Finner Primary Care Maisa Bedingfield: Docia Chuck, [place] Other Clinician: Referring Risa Auman: Treating Deronte Solis/Extender: Richardo Priest, [place] Weeks in Treatment: 29 Visit Information History Since Last Visit All ordered tests and consults were completed: No Patient Arrived: Ambulatory Added or deleted any medications: No Arrival Time: 08:08 Any new allergies or adverse reactions: No Accompanied By: self Had a fall or experienced change in No Transfer Assistance: None activities of daily living that may affect Patient Identification Verified: Yes risk of falls: Secondary Verification Process Completed: Yes Signs or symptoms of abuse/neglect since last visito No Patient Requires Transmission-Based Precautions: No Hospitalized since last visit: No Patient Has Alerts: Yes Implantable device outside of the clinic excluding No cellular tissue based products placed in the center since last visit: Has Dressing in Place as Prescribed: Yes Pain Present Now: No Electronic Signature(s) Signed: 04/29/2020 5:50:16 PM By: Yevonne Pax RN Entered By: Yevonne Pax on 04/20/2020 08:09:28 -------------------------------------------------------------------------------- Encounter Discharge Information Details Patient Name: Date of Service: Cheyenne Aran B. 04/20/2020 8:00 A M Medical Record Number: [id number] Patient Account Number: [id number] Date of Birth/Sex: Treating RN: [birth year] (59 y.o. Freddy Finner Primary Care Grissel Tyrell: Docia Chuck, [place] Other Clinician: Referring Stanlee Roehrig: Treating Ayauna Mcnay/Extender: Richardo Priest, [place] Weeks in Treatment: 96 Encounter Discharge Information Items Discharge  Condition: Stable Ambulatory Status: Ambulatory Discharge Destination: Home Transportation: Private Auto Accompanied By: self Schedule Follow-up Appointment: Yes Clinical Summary of Care: Patient Declined Electronic Signature(s) Signed: 04/29/2020 5:50:16 PM By: Yevonne Pax RN Entered By: Yevonne Pax on 04/20/2020 08:19:32 -------------------------------------------------------------------------------- Negative Pressure Wound Therapy Maintenance (NPWT) Details Patient Name: Date of Service: [name], [name] 04/20/2020 8:00 A M Medical Record Number: [id number] Patient Account Number: [id number] Date of Birth/Sex: Treating RN: 12-28-1960 (59 y.o. Freddy Finner Primary Care Paraskevi Funez: Docia Chuck, [place] Other Clinician: Referring Gladie Gravette: Treating Jorryn Casagrande/Extender: Richardo Priest, [place] Weeks in Treatment: 29 NPWT Maintenance Performed for: Wound #4 Left, Proximal, Anterior Lower Leg Performed By: Yevonne Pax, RN Type: Other Coverage Size (sq cm): 0.35 Pressure Type: Constant Pressure Setting: 125 mmHG Drain Type: None Sponge/Dressing Type: Foam, Green Date Initiated: 04/13/2020 Dressing Removed: No Quantity of Sponges/Gauze Removed: 1 Canister Changed: No Dressing Reapplied: No Quantity of Sponges/Gauze Inserted: 1 Days On NPWT : 8 Electronic Signature(s) Signed: 04/29/2020 5:50:16 PM By: Yevonne Pax RN Entered By: Yevonne Pax on 04/20/2020 08:17:18 -------------------------------------------------------------------------------- Patient/Caregiver Education Details Patient Name: Date of Service: [name], Cheyenne B. 8/18/2021andnbsp8:00 A M Medical Record Number: [id number] Patient Account Number: [id number] Date of Birth/Gender: Treating RN: 02-Apr-1961 (59 y.o. Freddy Finner Primary Care Physician: Docia Chuck, [place] Other Clinician: Referring Physician: Treating Physician/Extender: Richardo Priest, [place] Weeks in Treatment: 11 Education  Assessment Education Provided To: Patient Education Topics Provided Wound/Skin Impairment: Methods: Explain/Verbal Responses: State content correctly Electronic Signature(s) Signed: 04/29/2020 5:50:16 PM By: Yevonne Pax RN Entered By: Yevonne Pax on 04/20/2020 08:19:10 -------------------------------------------------------------------------------- Wound Assessment Details Patient Name: Date of Service: [name], [name] 04/20/2020 8:00 A M Medical Record Number: [id number] Patient Account Number: [id number] Date of Birth/Sex: Treating RN: [birth year] (59 y.o. Freddy Finner Primary Care Voncile Schwarz: Docia Chuck, [place] Other Clinician: Referring Blakely Gluth: Treating Jasemine Nawaz/Extender: Richardo Priest, [place] Weeks in Treatment: 29 Wound Status Wound Number: 2 Primary Etiology: Venous Leg Ulcer Wound Location: Left, Anterior Lower  Leg Wound Status: Open Wounding Event: Trauma Comorbid History: Lymphedema, Raynauds, Neuropathy Date Acquired: 08/06/2019 Weeks Of Treatment: 29 Clustered Wound: No Wound Measurements Length: (cm) 3.2 Width: (cm) 2.1 Depth: (cm) 0.2 Area: (cm) 5.278 Volume: (cm) 1.056 % Reduction in Area: 71.1% % Reduction in Volume: 71% Epithelialization: Small (1-33%) Tunneling: No Undermining: No Wound Description Classification: Full Thickness With Exposed Support Structures Wound Margin: Distinct, outline attached Exudate Amount: Medium Exudate Type: Serosanguineous Exudate Color: red, brown Foul Odor After Cleansing: No Slough/Fibrino No Wound Bed Granulation Amount: Large (67-100%) Exposed Structure Granulation Quality: Red, Pink Fascia Exposed: No Necrotic Amount: None Present (0%) Fat Layer (Subcutaneous Tissue) Exposed: Yes Tendon Exposed: No Muscle Exposed: No Joint Exposed: No Bone Exposed: No Electronic Signature(s) Signed: 04/29/2020 5:50:16 PM By: Yevonne Pax RN Entered By: Yevonne Pax on 04/20/2020  08:10:44 -------------------------------------------------------------------------------- Wound Assessment Details Patient Name: Date of Service: [name], [name] 04/20/2020 8:00 A M Medical Record Number: [id number] Patient Account Number: [id number] Date of Birth/Sex: Treating RN: Mar 04, 1961 (59 y.o. Freddy Finner Primary Care Nasiya Pascual: Docia Chuck, [place] Other Clinician: Referring Sharniece Gibbon: Treating Kurt Hoffmeier/Extender: Richardo Priest, [place] Weeks in Treatment: 29 Wound Status Wound Number: 4 Primary Etiology: Venous Leg Ulcer Wound Location: Left, Proximal, Anterior Lower Leg Wound Status: Open Wounding Event: Gradually Appeared Comorbid History: Lymphedema, Raynauds, Neuropathy Date Acquired: 01/20/2020 Weeks Of Treatment: 12 Clustered Wound: No Wound Measurements Length: (cm) 0.7 Width: (cm) 0.5 Depth: (cm) 0.5 Area: (cm) 0.275 Volume: (cm) 0.137 % Reduction in Area: -16.5% % Reduction in Volume: -93% Epithelialization: None Tunneling: No Undermining: No Wound Description Classification: Full Thickness Without Exposed Support Structures Wound Margin: Well defined, not attached Exudate Amount: Medium Exudate Type: Serosanguineous Exudate Color: red, brown Foul Odor After Cleansing: No Slough/Fibrino Yes Wound Bed Granulation Amount: Medium (34-66%) Exposed Structure Granulation Quality: Pink Fascia Exposed: No Necrotic Amount: Medium (34-66%) Fat Layer (Subcutaneous Tissue) Exposed: Yes Necrotic Quality: Adherent Slough Tendon Exposed: No Muscle Exposed: No Joint Exposed: No Bone Exposed: No Electronic Signature(s) Signed: 04/29/2020 5:50:16 PM By: Yevonne Pax RN Entered By: Yevonne Pax on 04/20/2020 08:10:54 -------------------------------------------------------------------------------- Vitals Details Patient Name: Date of Service: Cheyenne Aran B. 04/20/2020 8:00 A M Medical Record Number: [id number] Patient Account Number: [id number] Date  of Birth/Sex: Treating RN: 1961/04/28 (59 y.o. Freddy Finner Primary Care Danaye Sobh: Docia Chuck, [place] Other Clinician: Referring Rhyen Mazariego: Treating Casimira Sutphin/Extender: Richardo Priest, [place] Weeks in Treatment: 29 Vital Signs Time Taken: 08:09 Temperature (F): 97.6 Height (in): 66 Pulse (bpm): 82 Weight (lbs): 156 Respiratory Rate (breaths/min): 18 Body Mass Index (BMI): 25.2 Blood Pressure (mmHg): 108/71 Reference Range: 80 - 120 mg / dl Electronic Signature(s) Signed: 04/29/2020 5:50:16 PM By: Yevonne Pax RN Entered By: Yevonne Pax on 04/20/2020 08:10:06

## 2020-04-30 NOTE — Progress Notes (Signed)
[name], [name] ([id number]) Visit Report for 04/20/2020 SuperBill Details Patient Name: Date of Service: [name], [name] 04/20/2020 Medical Record Number: [id number] Patient Account Number: [id number] Date of Birth/Sex: Treating RN: 07-20-1961 (59 y.o. Freddy Finner Primary Care Provider: Docia Chuck, [place] Other Clinician: Referring Provider: Treating Provider/Extender: Richardo Priest, [place] Weeks in Treatment: 29 Diagnosis Coding ICD-10 Codes Code Description I87.2 Venous insufficiency (chronic) (peripheral) L97.822 Non-pressure chronic ulcer of other part of left lower leg with fat layer exposed L03.116 Cellulitis of left lower limb L97.828 Non-pressure chronic ulcer of other part of left lower leg with other specified severity L97.322 Non-pressure chronic ulcer of left ankle with fat layer exposed 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 I73.00 Raynaud's syndrome without gangrene 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, unspecified Facility Procedures CPT4 Code Description Modifier Quantity [id number] NEG PRESS WND TX <=50 SQ CM 1 Electronic Signature(s) Signed: 04/20/2020 5:00:27 PM By: Lenda Kelp PA-C Signed: 04/29/2020 5:50:16 PM By: Yevonne Pax RN Entered By: Yevonne Pax on 04/20/2020 08:21:30

## 2020-05-04 ENCOUNTER — Encounter (HOSPITAL_BASED_OUTPATIENT_CLINIC_OR_DEPARTMENT_OTHER): Payer: Worker's Compensation | Attending: Physician Assistant | Admitting: Physician Assistant

## 2020-05-04 ENCOUNTER — Other Ambulatory Visit: Payer: Self-pay

## 2020-05-04 DIAGNOSIS — L97828 Non-pressure chronic ulcer of other part of left lower leg with other specified severity: Secondary | ICD-10-CM | POA: Insufficient documentation

## 2020-05-04 DIAGNOSIS — L97822 Non-pressure chronic ulcer of other part of left lower leg with fat layer exposed: Secondary | ICD-10-CM | POA: Diagnosis not present

## 2020-05-04 DIAGNOSIS — L03116 Cellulitis of left lower limb: Secondary | ICD-10-CM | POA: Insufficient documentation

## 2020-05-04 DIAGNOSIS — G609 Hereditary and idiopathic neuropathy, unspecified: Secondary | ICD-10-CM | POA: Diagnosis not present

## 2020-05-04 DIAGNOSIS — I7300 Raynaud's syndrome without gangrene: Secondary | ICD-10-CM | POA: Insufficient documentation

## 2020-05-04 DIAGNOSIS — L97322 Non-pressure chronic ulcer of left ankle with fat layer exposed: Secondary | ICD-10-CM | POA: Insufficient documentation

## 2020-05-04 DIAGNOSIS — I872 Venous insufficiency (chronic) (peripheral): Secondary | ICD-10-CM | POA: Insufficient documentation

## 2020-05-04 DIAGNOSIS — M3501 Sicca syndrome with keratoconjunctivitis: Secondary | ICD-10-CM | POA: Diagnosis not present

## 2020-05-04 NOTE — Progress Notes (Signed)
[name], [name] ([id number]) Visit Report for 05/04/2020 Arrival Information Details Patient Name: Date of Service: [name], [name] 05/04/2020 8:00 A M Medical Record Number: [id number] Patient Account Number: [id number] Date of Birth/Sex: Treating RN: 1960-10-01 (59 y.o. [name] Primary Care [name]: [name], [name] Other Clinician: Referring [name]: Treating [name]/Extender: [name], [name] Weeks in Richardson: 56 Visit Information History Since Last Visit Added or deleted any medications: No Patient Arrived: Ambulatory Any new allergies or adverse reactions: No Arrival Time: 08:01 Had a fall or experienced change in No Accompanied By: self activities of daily living that may affect Transfer Assistance: None risk of falls: Patient Identification Verified: Yes Signs or symptoms of abuse/neglect since last visito No Secondary Verification Process Completed: Yes Hospitalized since last visit: No Patient Requires Transmission-Based Precautions: No Implantable device outside of the clinic excluding No Patient Has Alerts: Yes cellular tissue based products placed in the center since last visit: Has Dressing in Place as Prescribed: Yes Pain Present Now: No Electronic Signature(s) Signed: 05/04/2020 11:00:36 AM By: Sandre Kitty Entered By: Sandre Kitty on 05/04/2020 08:03:38 -------------------------------------------------------------------------------- Clinic Level of Care Assessment Details Patient Name: Date of Service: [name], [name] 05/04/2020 8:00 A M Medical Record Number: [id number] Patient Account Number: [id number] Date of Birth/Sex: Treating RN: [birth year] (59 y.o. [name] Primary Care [name]: [name], [name] Other Clinician: Referring [name]: Treating [name]/Extender: [name], [name] Weeks in Richardson: 31 Clinic Level of Care Assessment Items TOOL 4 Quantity Score []  - 0 Use when only an EandM is  performed on FOLLOW-UP visit ASSESSMENTS - Nursing Assessment / Reassessment X- 1 10 Reassessment of Co-morbidities (includes updates in patient status) X- 1 5 Reassessment of Adherence to Richardson Plan ASSESSMENTS - Wound and Skin A ssessment / Reassessment []  - 0 Simple Wound Assessment / Reassessment - one wound X- 2 5 Complex Wound Assessment / Reassessment - multiple wounds []  - 0 Dermatologic / Skin Assessment (not related to wound area) ASSESSMENTS - Focused Assessment X- 1 5 Circumferential Edema Measurements - multi extremities []  - 0 Nutritional Assessment / Counseling / Intervention X- 1 5 Lower Extremity Assessment (monofilament, tuning fork, pulses) []  - 0 Peripheral Arterial Disease Assessment (using hand held doppler) ASSESSMENTS - Ostomy and/or Continence Assessment and Care []  - 0 Incontinence Assessment and Management []  - 0 Ostomy Care Assessment and Management (repouching, etc.) PROCESS - Coordination of Care X - Simple Patient / Family Education for ongoing care 1 15 []  - 0 Complex (extensive) Patient / Family Education for ongoing care X- 1 10 Staff obtains Programmer, systems, Records, T Results / Process Orders est []  - 0 Staff telephones HHA, Nursing Homes / Clarify orders / etc []  - 0 Routine Transfer to another Facility (non-emergent condition) []  - 0 Routine Hospital Admission (non-emergent condition) []  - 0 New Admissions / Biomedical engineer / Ordering NPWT Apligraf, etc. , []  - 0 Emergency Hospital Admission (emergent condition) X- 1 10 Simple Discharge Coordination []  - 0 Complex (extensive) Discharge Coordination PROCESS - Special Needs []  - 0 Pediatric / Minor Patient Management []  - 0 Isolation Patient Management []  - 0 Hearing / Language / Visual special needs []  - 0 Assessment of Community assistance (transportation, D/C planning, etc.) []  - 0 Additional assistance / Altered mentation []  - 0 Support Surface(s) Assessment  (bed, cushion, seat, etc.) INTERVENTIONS - Wound Cleansing / Measurement []  - 0 Simple Wound Cleansing - one wound X- 2 5 Complex Wound Cleansing - multiple wounds X- 1  5 Wound Imaging (photographs - any number of wounds) []  - 0 Wound Tracing (instead of photographs) []  - 0 Simple Wound Measurement - one wound X- 2 5 Complex Wound Measurement - multiple wounds INTERVENTIONS - Wound Dressings X - Small Wound Dressing one or multiple wounds 2 10 []  - 0 Medium Wound Dressing one or multiple wounds []  - 0 Large Wound Dressing one or multiple wounds X- 1 5 Application of Medications - topical []  - 0 Application of Medications - injection INTERVENTIONS - Miscellaneous []  - 0 External ear exam []  - 0 Specimen Collection (cultures, biopsies, blood, body fluids, etc.) []  - 0 Specimen(s) / Culture(s) sent or taken to Lab for analysis []  - 0 Patient Transfer (multiple staff / Civil Service fast streamer / Similar devices) []  - 0 Simple Staple / Suture removal (25 or less) []  - 0 Complex Staple / Suture removal (26 or more) []  - 0 Hypo / Hyperglycemic Management (close monitor of Blood Glucose) []  - 0 Ankle / Brachial Index (ABI) - do not check if billed separately X- 1 5 Vital Signs Has the patient been seen at the hospital within the last three years: Yes Total Score: 125 Level Of Care: New/Established - Level 4 Electronic Signature(s) Signed: 05/04/2020 6:44:49 PM By: Baruch Gouty RN, BSN Entered By: Baruch Gouty on 05/04/2020 08:56:43 -------------------------------------------------------------------------------- Encounter Discharge Information Details Patient Name: Date of Service: Cheyenne Mccallum B. 05/04/2020 8:00 A M Medical Record Number: [id number] Patient Account Number: [id number] Date of Birth/Sex: Treating RN: [birth year] (59 y.o. [name] Primary Care [name]: [name], [name] Other Clinician: Referring [name]: Treating Rome Echavarria/Extender: [name],  [name] Weeks in Richardson: 16 Encounter Discharge Information Items Discharge Condition: Stable Ambulatory Status: Ambulatory Discharge Destination: Home Transportation: Private Auto Accompanied By: self Schedule Follow-up Appointment: Yes Clinical Summary of Care: Patient Declined Electronic Signature(s) Signed: 05/04/2020 5:45:10 PM By: Carlene Coria RN Entered By: Carlene Coria on 05/04/2020 09:18:29 -------------------------------------------------------------------------------- Lower Extremity Assessment Details Patient Name: Date of Service: [name], [name] 05/04/2020 8:00 A M Medical Record Number: [id number] Patient Account Number: [id number] Date of Birth/Sex: Treating RN: 1960-10-02 (59 y.o. [name] Primary Care Mileydi Milsap: [place], [name] Other Clinician: Referring Careen Mauch: Treating Rafe Mackowski/Extender: [name], [name] Weeks in Richardson: 31 Edema Assessment Assessed: [Left: No] [Right: No] Edema: [Left: Ye] [Right: s] Calf Left: Right: Point of Measurement: 29 cm From Medial Instep 32 cm cm Ankle Left: Right: Point of Measurement: 8 cm From Medial Instep 22 cm cm Vascular Assessment Pulses: Dorsalis Pedis Palpable: [Left:Yes] Electronic Signature(s) Signed: 05/04/2020 11:03:23 AM By: Levan Hurst RN, BSN Entered By: Levan Hurst on 05/04/2020 08:17:15 -------------------------------------------------------------------------------- [place] Details Patient Name: Date of Service: Cheyenne Mccallum B. 05/04/2020 8:00 A M Medical Record Number: [id number] Patient Account Number: [id number] Date of Birth/Sex: Treating RN: 08-18-1961 (59 y.o. [name] Primary Care Kabrea Seeney: [name], [name] Other Clinician: Referring Jamonte Curfman: Treating Keeleigh Terris/Extender: [name], [name] Weeks in Richardson: 23 Active Inactive Venous Leg Ulcer Nursing Diagnoses: Knowledge deficit related to disease process and  management Potential for venous Insuffiency (use before diagnosis confirmed) Goals: Patient will maintain optimal edema control Date Initiated: 09/30/2019 Target Resolution Date: 05/25/2020 Goal Status: Active Patient/caregiver will verbalize understanding of disease process and disease management Date Initiated: 09/30/2019 Date Inactivated: 11/25/2019 Target Resolution Date: 11/25/2019 Goal Status: Met Interventions: Assess peripheral edema status every visit. Compression as ordered Provide education on venous insufficiency Richardson Activities: Therapeutic compression applied : 09/30/2019 Notes: Wound/Skin Impairment Nursing Diagnoses: Impaired  tissue integrity Knowledge deficit related to ulceration/compromised skin integrity Goals: Patient/caregiver will verbalize understanding of skin care regimen Date Initiated: 09/30/2019 Target Resolution Date: 05/25/2020 Goal Status: Active Ulcer/skin breakdown will have a volume reduction of 30% by week 4 Date Initiated: 09/30/2019 Date Inactivated: 10/28/2019 Target Resolution Date: 10/28/2019 Goal Status: Unmet Unmet Reason: other comorbidities Interventions: Assess patient/caregiver ability to obtain necessary supplies Assess patient/caregiver ability to perform ulcer/skin care regimen upon admission and as needed Assess ulceration(s) every visit Provide education on ulcer and skin care Richardson Activities: Skin care regimen initiated : 09/30/2019 Topical wound management initiated : 09/30/2019 Notes: Electronic Signature(s) Signed: 05/04/2020 6:44:49 PM By: Baruch Gouty RN, BSN Entered By: Baruch Gouty on 05/04/2020 08:55:20 -------------------------------------------------------------------------------- Pain Assessment Details Patient Name: Date of Service: [name] 05/04/2020 8:00 A M Medical Record Number: [id number] Patient Account Number: [id number] Date of Birth/Sex: Treating RN: [birth year] (59 y.o. [name] Primary Care Saya Mccoll: [name], [name] Other Clinician: Referring Darnesha Diloreto: Treating Mckenze Slone/Extender: [name], [name] Weeks in Richardson: 15 Active Problems Location of Pain Severity and Description of Pain Patient Has Paino No Site Locations Pain Management and Medication Current Pain Management: Electronic Signature(s) Signed: 05/04/2020 11:00:36 AM By: Sandre Kitty Signed: 05/04/2020 6:44:49 PM By: Baruch Gouty RN, BSN Entered By: Sandre Kitty on 05/04/2020 08:03:59 -------------------------------------------------------------------------------- Patient/Caregiver Education Details Patient Name: Date of Service: [name], Cheyenne B. 9/1/2021andnbsp8:00 A M Medical Record Number: [id number] Patient Account Number: [id number] Date of Birth/Gender: Treating RN: 01-Nov-1960 (59 y.o. [name] Primary Care Physician: [name], [place] Other Clinician: Referring Physician: Treating Physician/Extender: [name], [name] Weeks in Richardson: 60 Education Assessment Education Provided To: Patient Education Topics Provided Venous: Methods: Explain/Verbal Responses: Reinforcements needed, State content correctly Wound/Skin Impairment: Methods: Explain/Verbal Responses: Reinforcements needed, State content correctly Electronic Signature(s) Signed: 05/04/2020 6:44:49 PM By: Baruch Gouty RN, BSN Entered By: Baruch Gouty on 05/04/2020 08:55:41 -------------------------------------------------------------------------------- Wound Assessment Details Patient Name: Date of Service: Cheyenne Mccallum B. 05/04/2020 8:00 A M Medical Record Number: [id number] Patient Account Number: [id number] Date of Birth/Sex: Treating RN: [birth year] (59 y.o. [name] Primary Care Gared Gillie: [name], [name] Other Clinician: Referring Norlan Rann: Treating [employer]/Extender: [name], [name] Weeks in Richardson: 31 Wound Status Wound  Number: 2 Primary Etiology: Venous Leg Ulcer Wound Location: Left, Anterior Lower Leg Wound Status: Open Wounding Event: Trauma Comorbid History: Lymphedema, Raynauds, Neuropathy Date Acquired: 08/06/2019 Weeks Of Richardson: 31 Clustered Wound: No Wound Measurements Length: (cm) 2.1 Width: (cm) 0.9 Depth: (cm) 0.1 Area: (cm) 1.484 Volume: (cm) 0.148 % Reduction in Area: 91.9% % Reduction in Volume: 95.9% Epithelialization: Medium (34-66%) Tunneling: No Undermining: No Wound Description Classification: Full Thickness With Exposed Support Structures Wound Margin: Distinct, outline attached Exudate Amount: Medium Exudate Type: Serosanguineous Exudate Color: red, brown Foul Odor After Cleansing: No Slough/Fibrino No Wound Bed Granulation Amount: Large (67-100%) Exposed Structure Granulation Quality: Red, Pink Fascia Exposed: No Necrotic Amount: None Present (0%) Fat Layer (Subcutaneous Tissue) Exposed: Yes Tendon Exposed: No Muscle Exposed: No Joint Exposed: No Bone Exposed: No Richardson Notes Wound #2 (Left, Anterior Lower Leg) 1. Cleanse With Wound Cleanser Soap and water 2. Periwound Care TCA Cream 3. Primary Dressing Applied Collegen AG 4. Secondary Dressing Dry Gauze 6. Support Layer Holiday representative) Signed: 05/04/2020 11:03:23 AM By: Levan Hurst RN, BSN Signed: 05/04/2020 6:44:49 PM By: Baruch Gouty RN, BSN Entered By: Levan Hurst on 05/04/2020 08:19:17 -------------------------------------------------------------------------------- Wound Assessment Details Patient Name: Date of Service: Cheyenne Mccallum B. 05/04/2020 8:00  A M Medical Record Number: [id number] Patient Account Number: [id number] Date of Birth/Sex: Treating RN: 1960-09-25 (59 y.o. [name] Primary Care Amany Rando: [name], [name] Other Clinician: Referring Sudais Banghart: Treating Darell Saputo/Extender: [name], [name] Weeks in Richardson:  31 Wound Status Wound Number: 4 Primary Etiology: Venous Leg Ulcer Wound Location: Left, Proximal, Anterior Lower Leg Wound Status: Open Wounding Event: Gradually Appeared Comorbid History: Lymphedema, Raynauds, Neuropathy Date Acquired: 01/20/2020 Weeks Of Richardson: 14 Clustered Wound: No Wound Measurements Length: (cm) 0.5 Width: (cm) 0.3 Depth: (cm) 0.1 Area: (cm) 0.118 Volume: (cm) 0.012 % Reduction in Area: 50% % Reduction in Volume: 83.1% Epithelialization: Large (67-100%) Tunneling: No Undermining: No Wound Description Classification: Full Thickness Without Exposed Support Structures Wound Margin: Well defined, not attached Exudate Amount: Small Exudate Type: Serosanguineous Exudate Color: red, brown Foul Odor After Cleansing: No Slough/Fibrino Yes Wound Bed Granulation Amount: Large (67-100%) Exposed Structure Granulation Quality: Pink Fascia Exposed: No Necrotic Amount: None Present (0%) Fat Layer (Subcutaneous Tissue) Exposed: Yes Tendon Exposed: No Muscle Exposed: No Joint Exposed: No Bone Exposed: No Richardson Notes Wound #4 (Left, Proximal, Anterior Lower Leg) 1. Cleanse With Wound Cleanser Soap and water 2. Periwound Care TCA Cream 3. Primary Dressing Applied Collegen AG 4. Secondary Dressing Dry Gauze 6. Support Layer Holiday representative) Signed: 05/04/2020 11:03:23 AM By: Levan Hurst RN, BSN Signed: 05/04/2020 6:44:49 PM By: Baruch Gouty RN, BSN Entered By: Levan Hurst on 05/04/2020 08:19:46 -------------------------------------------------------------------------------- [place] Details Patient Name: Date of Service: Cheyenne Mccallum B. 05/04/2020 8:00 A M Medical Record Number: [id number] Patient Account Number: [id number] Date of Birth/Sex: Treating RN: 10/06/1960 (59 y.o. [name] Primary Care Shyasia Funches: [name], [name] Other Clinician: Referring Gil Ingwersen: Treating Chares Slaymaker/Extender: [name], [name] Weeks in Richardson: 31 Vital Signs Time Taken: 08:03 Temperature (F): 98.1 Height (in): 66 Pulse (bpm): 92 Weight (lbs): 156 Respiratory Rate (breaths/min): 18 Body Mass Index (BMI): 25.2 Blood Pressure (mmHg): 128/85 Reference Range: 80 - 120 mg / dl Electronic Signature(s) Signed: 05/04/2020 11:00:36 AM By: Sandre Kitty Entered By: Sandre Kitty on 05/04/2020 08:03:53

## 2020-05-04 NOTE — Progress Notes (Addendum)
VELITA, QUIRK ([id number]) Visit Report for 05/04/2020 Chief Complaint Document Details Patient Name: Date of Service: [name], [name] 05/04/2020 8:00 A M Medical Record Number: [id number] Patient Account Number: [id number] Date of Birth/Sex: Treating RN: December 22, 1960 (59 y.o. [name] Standard Primary Care Provider: Docia Chuck, [place] Other Clinician: Referring Provider: Treating Provider/Extender: Richardo Priest, [place] Weeks in Treatment: 31 Information Obtained from: Patient Chief Complaint Left LE Ulcers Electronic Signature(s) Signed: 05/04/2020 8:21:25 AM By: Lenda Kelp PA-C Entered By: Lenda Kelp on 05/04/2020 56:25:63 -------------------------------------------------------------------------------- HPI Details Patient Name: Date of Service: Cheyenne Aran B. 05/04/2020 8:00 A M Medical Record Number: [id number] Patient Account Number: [id number] Date of Birth/Sex: Treating RN: 08/10/1961 (59 y.o. [name] Standard Primary Care Provider: Docia Chuck, [place] Other Clinician: Referring Provider: Treating Provider/Extender: Richardo Priest, [place] Weeks in Treatment: 31 History of Present Illness HPI Description: 09/30/2019 upon evaluation today patient presents for initial inspection here in our clinic concerning issues that she has been experiencing with a wound on her left anterior lower leg. She unfortunately had a significant laceration which required 19 sutures and she shows me the pictures both before as well as after sutured in a very good job was done in this regard. Unfortunately the skin did not take and necrosis. Subsequently this wound open and she has been having more significant issues with that since. Fortunately there is no evidence of active infection at this time which is good news. No fever chills noted. She has a history of chronic venous stasis but is no longer able to wear any compression in fact she cannot even wear regular socks due to  her neuropathy and the pain that she experiences in her feet if she does. For that reason she tries to elevate her legs as much as she can at nighttime but again she really cannot wear any type of compression. With regard to antibiotic it does not appear that she has been on anything recently. 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 Fortunately there is no evidence of local or systemic infection. No fevers, chills, nausea, vomiting, or diarrhea. 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 10/07/2019 on evaluation today patient presents for follow-up concerning her wound on the left lower extremity. She has been tolerating the dressing changes without complication. With that being said she just got the Santyl 2 days ago. Obviously it has not had enough time to really do what we need to do she would like to obviously give this some time before proceeding with more aggressive debridement or otherwise. 10/21/2019 upon evaluation today patient appears to be doing better with regard to the overall appearance of her wound is not quite as dry I do believe the Santyl has helped her improve to some degree here. Fortunately there is no signs of active infection at this time. I think we may be able to clean off the wound with a little bit of additional debridement today and likely we can transition away from the Santyl to some kind of collagen type dressing. 10/28/2019 upon evaluation today patient actually appears to be doing fairly well with regard to her wound today. She has been tolerating the dressing changes  we did switch to collagen and fortunately that seems to be doing well for her at this point. There does not seem to be any signs of active infection I do feel like the  surface of the wound is looking better at this time as well. 11/04/2019 upon evaluation today patient appears to be doing well with regard to her wound. She is making some progress here and the wound does appear to be measuring smaller which is good news. Overall this is good to be somewhat slow but nonetheless I am encouraged by the fact that the collagen does seem to have helped her. 11/11/2019 on evaluation today patient's wound is showing some signs of improvement. The good news is her overall trend does seem to be towards getting better compared to where she was previous. The smaller wounds along the side actually might even be healed but we can watch this 1 more week before I heal this out. Overall I feel like there is improvement here. She is still using the silver collagen. 11/18/2019 upon evaluation today patient appears to be doing about the same with regard to her wound. This is not significantly smaller there still appears to be a lot of inflammation. For that reason I do want to see about actually applying triamcinolone to the actual wound bed to see if this can be of benefit as well. I thought about this for couple weeks but I think it may be a good thing to proceed with at this point. 11/25/2019 on evaluation today patient appears to be doing well with regard to her lower extremity ulcer. She has been tolerating the dressing changes without complication. Fortunately there is no signs of active infection at this time. No fevers, chills, nausea, vomiting, or diarrhea. 12/02/2019 on evaluation today patient appears to be doing okay with regard to her ulcer on the leg. Fortunately there is no signs of active infection at this time. No fever chills noted she has been tolerating the dressing changes without complication. With that being said overall I am pleased with the fact that there is no signs of infection but at the same time I really feel like she needs more to progress moving week to week  and right now were really not seeing that. We have put in for approval for Apligraf which I think could help to move things along. With that being said we have not heard anything regarding approval at this point. 12/10/2019; patient comes in on my day although have not seen her previously. She has what was a traumatic wound on the left anterior mid tibia. Initially sutured however the area never really healed according to the patient. She has been using Santyl now using Prisma. There is exposed tendon. We have made application for Apligraf apparently this is going through [employer] still has not been approved. She arrives in clinic with erythema around the wound and 4 small circular papules superior to the wound. These are nontender and she is not complaining of any more pain. The exact cause of this is not really clear. The patient has idiopathic peripheral neuropathy which for which she has been extensively evaluated in the past she is not a diabetic she does not have any rheumatologic problems she is aware of. She cannot wear compression because it aggravates her neuropathy 4/15; traumatic wound on the left anterior mid tibia. She arrives today with extensive erythema spreading from the wound medially towards her heel. This is tender swollen and warm compatible with cellulitis.  We are using silver collagen to the wound 4/20; 5-day follow-up. Traumatic wound on the left anterior mid tibia. This is in follow-up for the cellulitis she had on her visit 5 days ago. I gave her empiric cephalexin I did not culture this. She arrives today with the erythema that we marked much improved. She has not been systemically unwell Upon evaluation today patient's wound unfortunately is not doing very well this in fact is somewhat deeper than even last week4/28/2021 which is unfortunate. I am very concerned about the fact that to be honest that the patient does not seem to be doing as good as we would have  expected in the past 2 weeks since I last saw her. Fortunately there is no evidence of active infection systemically which is good news. No fevers, chills, nausea, vomiting, or diarrhea. She has been on Keflex. 01/06/2020 upon evaluation today patient unfortunately appears to still be doing somewhat poorly overall in regard to her ulcer. There is definite signs of infection. She has been on Keflex in April but unfortunately this just does not seem to be doing as well as we would like. I do believe that we will get a need to place her on Augmentin in order to try to get this better. She tested positive for Proteus and E. coli on the culture and is allergic to doxycycline. The patient's wound currently is not looking healthy at all to me. 01/27/2020 upon evaluation today patient appears to be doing maybe slightly better with regard to the wound bed at this point. Her infection is definitely improved which is great news. As far as the Apligraf I do not believe the wound surface is quite clean enough yet in order for [place] to apply the Apligraf. Another potential option would be for a snap VAC to be used under the Curlex and Coban wrap working to try the wrap today. If we can get rid of some of the fluid I think this will help her with healing also think the snap VAC could be beneficial in this regard. Nonetheless again I am not given up on the Apligraf is a possibility I just do not want a waste the applications by putting it on before the wound surface is ready. Potentially the snap VAC can get this wound to the point where it is ready much more quickly. 02/03/2020 upon evaluation today patient appears to be doing excellent with regard to her leg ulcer compared to where things have been. The overall quality of the wound bed is significantly improved and very pleased in this regard. We did get approval for the snap VAC which I am wanting to go ahead and see about initiating at this point. We also have approval  for the Apligraf when and if the time is appropriate for this. With that being said I really think using the snap VAC to try to get this to fill-in would be appropriate to begin with. The patient is in agreement with that plan. I do believe the compression did help her and she states that that was uncomfortable she was able to manage with that for now. 02/10/2020 upon evaluation today patient appears to be doing better with regard to her original and largest wound. 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  I explained that pyoderma is incompletely understood and obviously very difficult sometimes to treat but often steroid therapy is the mainstay of treatment. I think topical steroid treatment is appropriate at this point. She does have mometasone which has been prescribed previously by her primary care provider and she has that with her so we can use that on some of the satellite areas not on the main wound. The patient is in agreement with that plan. 02/17/2020 upon evaluation today patient appears to be doing well with regard to her wound. She is making good progress with the snap VAC actually believe that she is in a good position for [place] to be able to continue the snap VAC but actually at the Apligraf to try to speed up the granulation and epithelial growth. The patient is actually in agreement with that she wants to try to get this healed as quickly as possible I completely agree with her. She did fill up the canister again today. 03/02/2020 upon evaluation today patient appears to be doing well at this time with regard to her wound. In fact this is dramatically improved with the use of the snap VAC and the Apligraf even since the last time I saw her 2 weeks ago. Again week she had a nurse visit and changed  out the snap VAC last week. Overall I think that she is definitely in a much better place and this wound is definitely making progress measuring smaller and overall I think progressing quite well. 7/13; wound surface looks very healthy. She has a small superior satellite lesion as well. Apligraf applied with a snap VAC. 03/30/2020 on evaluation today patient presents for follow-up concerning her lower extremity ulcers. Fortunately there is no signs of active infection at this time which is great news she has been tolerating the dressing changes without complication. Fortunately there is no evidence of anything really worsening the smaller wound more proximal does have some slough noted that is can require sharp debridement today however the main wound for which we have been applying Apligraf and the wound VAC seems to be doing quite well. I am extremely pleased with where things stand today. 04/13/2020 upon evaluation today patient appears to be doing much better in regard to the larger of her 2 wounds. I think we do not even really need the snap VAC any longer at this point. With regard to the smaller wound I think that this is good to be something that will continue to be beneficial as far as using the snap VAC on is concerned. We are going to reapply the Apligraf today. 04/27/2020 on evaluation today patient appears to be doing excellent in regard to both of her wounds. The snap VAC help the smaller area to feeling quite well and overall I think were very close to getting both to close. I believe collagen may be a good option at this point. 05/04/2020 upon evaluation today patient appears to be doing very well in regard to her wounds. Everything is measuring smaller and seems to be doing quite well. Overall very pleased with where things stand and the patient is extremely happy. Both wounds are getting closer and closer to complete closure and the smaller of the 2 is almost completely healed. Overall  very excited for her. Electronic Signature(s) Signed: 05/04/2020 9:52:37 AM By: Lenda Kelp PA-C Previous Signature: 05/04/2020 8:45:18 AM Version By: Lenda Kelp PA-C Previous Signature: 05/04/2020 8:43:39 AM Version By: Lenda Kelp PA-C Entered By: Linwood Dibbles,  Leonard Schwartz on 05/04/2020 09:52:36 -------------------------------------------------------------------------------- Physical Exam Details Patient Name: Date of Service: [name], [name] 05/04/2020 8:00 A M Medical Record Number: [id number] Patient Account Number: [id number] Date of Birth/Sex: Treating RN: [birth year] (59 y.o. [name] Standard Primary Care Provider: Docia Chuck, [place] Other Clinician: Referring Provider: Treating Provider/Extender: Richardo Priest, [place] Weeks in Treatment: 46 Constitutional Well-nourished and well-hydrated in no acute distress. Respiratory normal breathing without difficulty. Psychiatric this patient is able to make decisions and demonstrates good insight into disease process. Alert and Oriented x 3. pleasant and cooperative. Notes Patient's wound currently showed signs of some good granulation at this time. Fortunately there does not appear to be any evidence of significant slough buildup I do not think she needs to drawtex at this point Electronic Signature(s) Signed: 05/04/2020 9:52:55 AM By: Lenda Kelp PA-C Previous Signature: 05/04/2020 8:45:38 AM Version By: Lenda Kelp PA-C Previous Signature: 05/04/2020 8:43:57 AM Version By: Lenda Kelp PA-C Entered By: Lenda Kelp on 05/04/2020 09:52:54 -------------------------------------------------------------------------------- Physician Orders Details Patient Name: Date of Service: Cheyenne Aran B. 05/04/2020 8:00 A M Medical Record Number: [id number] Patient Account Number: [id number] Date of Birth/Sex: Treating RN: 03/13/1961 (60 y.o. 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 Dressing Change Frequency Wound #2 Left,Anterior Lower Leg Other: - 2 times per week Wound #4 Left,Proximal,Anterior Lower Leg Other: - 2 times per week Skin Barriers/Peri-Wound Care Moisturizing lotion TCA Cream or Ointment - or menometasone cream to irritated periwound skin Wound Cleansing Wound #2 Left,Anterior Lower Leg May shower with protection. Primary Wound Dressing Wound #2 Left,Anterior Lower Leg Silver Collagen Wound #4 Left,Proximal,Anterior Lower Leg Silver Collagen Secondary Dressing Wound #2 Left,Anterior Lower Leg Dry Gauze Wound #4 Left,Proximal,Anterior Lower Leg Dry Gauze Edema Control Kerlix and Coban - Left Lower Extremity Avoid standing for long periods of time Elevate legs to the level of the heart or above for 30 minutes daily and/or when sitting, a frequency of: - throughout the day Exercise regularly Electronic Signature(s) Signed: 05/04/2020 6:44:49 PM By: Zenaida Deed RN, BSN Signed: 05/07/2020 11:53:24 AM By: Lenda Kelp PA-C Entered By: Zenaida Deed on 05/04/2020 08:58:26 -------------------------------------------------------------------------------- Problem List Details Patient Name: Date of Service: Cheyenne Aran B. 05/04/2020  8:00 A M Medical Record Number: [id number] Patient Account Number: [id number] Date of Birth/Sex: Treating RN: 09/22/1960 (59 y.o. [name] Standard Primary Care Provider: Docia Chuck, [place] Other Clinician: Referring Provider: Treating Provider/Extender: Richardo Priest, [place] Weeks in Treatment: 31 Active Problems ICD-10 Encounter Code Description Active Date MDM Diagnosis I87.2 Venous insufficiency (chronic) (peripheral) 09/30/2019 No Yes L97.822 Non-pressure chronic ulcer of other part of left lower leg with fat layer exposed 09/30/2019 No Yes L03.116 Cellulitis of left lower limb 12/17/2019 No Yes L97.828 Non-pressure chronic ulcer of other part of left lower leg with other specified 09/30/2019 No Yes severity L97.322 Non-pressure chronic ulcer of left ankle with fat layer exposed  02/03/2020 No Yes 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 05/04/2020 8:00 A M Medical Record Number: [id number] Patient Account Number: [id number] Date of Birth/Sex: Treating RN: [birth year] (59 y.o. [name] Standard Primary Care Provider: Docia Chuck, [place] Other Clinician: Referring Provider: Treating Provider/Extender: Richardo Priest, [place] Weeks in Treatment: 31 Subjective Chief Complaint Information obtained from Patient Left LE Ulcers History of Present Illness (HPI) 09/30/2019 upon evaluation today patient presents for initial inspection here in our clinic concerning issues that she has been experiencing with a wound on  her left anterior lower leg. She unfortunately had a significant laceration which required 19 sutures and she shows me the pictures both before as well as after sutured in a very good job was done in this regard. Unfortunately the skin did not take and necrosis. Subsequently this wound open and she has been having more significant issues with that since. Fortunately there is no evidence of active infection at this time which is good news. No fever chills noted. She has a history of chronic venous stasis but is no longer able to wear any compression in fact she cannot even wear regular socks due to her neuropathy and the pain that she experiences in her feet if she does. For that reason she tries to elevate her legs as much as she can at nighttime but again she really cannot wear any type of compression. With regard to antibiotic it does not appear that she has been on anything recently. 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 Fortunately there is no evidence of local or systemic infection. No fevers, chills, nausea, vomiting, or diarrhea. 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 10/07/2019 on evaluation today patient presents for follow-up concerning her wound on the left lower extremity. She has been tolerating the dressing changes without complication. With that being said she just got the Santyl 2 days ago. Obviously it has not had enough time to really do what we need to do she would like to obviously give this some time before proceeding with more aggressive debridement or otherwise. 10/21/2019 upon evaluation today patient appears to be doing better with regard to the overall appearance of her wound is not quite as dry I do believe the Santyl has helped her  improve to some degree here. Fortunately there is no signs of active infection at this time. I think we may be able to clean off the wound with a little bit of additional debridement today and likely we can transition away from the Santyl to some kind of collagen type dressing. 10/28/2019 upon evaluation today patient actually appears to be doing fairly well with regard to her wound today. She has been tolerating the dressing changes we did switch to collagen and fortunately that seems to be doing well for her at this point. There does not seem to be any signs of active infection  I do feel like the surface of the wound is looking better at this time as well. 11/04/2019 upon evaluation today patient appears to be doing well with regard to her wound. She is making some progress here and the wound does appear to be measuring smaller which is good news. Overall this is good to be somewhat slow but nonetheless I am encouraged by the fact that the collagen does seem to have helped her. 11/11/2019 on evaluation today patient's wound is showing some signs of improvement. The good news is her overall trend does seem to be towards getting better compared to where she was previous. The smaller wounds along the side actually might even be healed but we can watch this 1 more week before I heal this out. Overall I feel like there is improvement here. She is still using the silver collagen. 11/18/2019 upon evaluation today patient appears to be doing about the same with regard to her wound. This is not significantly smaller there still appears to be a lot of inflammation. For that reason I do want to see about actually applying triamcinolone to the actual wound bed to see if this can be of benefit as well. I thought about this for couple weeks but I think it may be a good thing to proceed with at this point. 11/25/2019 on evaluation today patient appears to be doing well with regard to her lower extremity ulcer. She has  been tolerating the dressing changes without complication. Fortunately there is no signs of active infection at this time. No fevers, chills, nausea, vomiting, or diarrhea. 12/02/2019 on evaluation today patient appears to be doing okay with regard to her ulcer on the leg. Fortunately there is no signs of active infection at this time. No fever chills noted she has been tolerating the dressing changes without complication. With that being said overall I am pleased with the fact that there is no signs of infection but at the same time I really feel like she needs more to progress moving week to week and right now were really not seeing that. We have put in for approval for Apligraf which I think could help to move things along. With that being said we have not heard anything regarding approval at this point. 12/10/2019; patient comes in on my day although have not seen her previously. She has what was a traumatic wound on the left anterior mid tibia. Initially sutured however the area never really healed according to the patient. She has been using Santyl now using Prisma. There is exposed tendon. We have made application for Apligraf apparently this is going through [employer] still has not been approved. She arrives in clinic with erythema around the wound and 4 small circular papules superior to the wound. These are nontender and she is not complaining of any more pain. The exact cause of this is not really clear. The patient has idiopathic peripheral neuropathy which for which she has been extensively evaluated in the past she is not a diabetic she does not have any rheumatologic problems she is aware of. She cannot wear compression because it aggravates her neuropathy 4/15; traumatic wound on the left anterior mid tibia. She arrives today with extensive erythema spreading from the wound medially towards her heel. This is tender swollen and warm compatible with cellulitis. We are using  silver collagen to the wound 4/20; 5-day follow-up. Traumatic wound on the left anterior mid tibia. This is in follow-up for the cellulitis she had on  her visit 5 days ago. I gave her empiric cephalexin I did not culture this. She arrives today with the erythema that we marked much improved. She has not been systemically unwell Upon evaluation today patient's wound unfortunately is not doing very well this in fact is somewhat deeper than even last week4/28/2021 which is unfortunate. I am very concerned about the fact that to be honest that the patient does not seem to be doing as good as we would have expected in the past 2 weeks since I last saw her. Fortunately there is no evidence of active infection systemically which is good news. No fevers, chills, nausea, vomiting, or diarrhea. She has been on Keflex. 01/06/2020 upon evaluation today patient unfortunately appears to still be doing somewhat poorly overall in regard to her ulcer. There is definite signs of infection. She has been on Keflex in April but unfortunately this just does not seem to be doing as well as we would like. I do believe that we will get a need to place her on Augmentin in order to try to get this better. She tested positive for Proteus and E. coli on the culture and is allergic to doxycycline. The patient's wound currently is not looking healthy at all to me. 01/27/2020 upon evaluation today patient appears to be doing maybe slightly better with regard to the wound bed at this point. Her infection is definitely improved which is great news. As far as the Apligraf I do not believe the wound surface is quite clean enough yet in order for [place] to apply the Apligraf. Another potential option would be for a snap VAC to be used under the Curlex and Coban wrap working to try the wrap today. If we can get rid of some of the fluid I think this will help her with healing also think the snap VAC could be beneficial in this regard. Nonetheless  again I am not given up on the Apligraf is a possibility I just do not want a waste the applications by putting it on before the wound surface is ready. Potentially the snap VAC can get this wound to the point where it is ready much more quickly. 02/03/2020 upon evaluation today patient appears to be doing excellent with regard to her leg ulcer compared to where things have been. The overall quality of the wound bed is significantly improved and very pleased in this regard. We did get approval for the snap VAC which I am wanting to go ahead and see about initiating at this point. We also have approval for the Apligraf when and if the time is appropriate for this. With that being said I really think using the snap VAC to try to get this to fill-in would be appropriate to begin with. The patient is in agreement with that plan. I do believe the compression did help her and she states that that was uncomfortable she was able to manage with that for now. 02/10/2020 upon evaluation today patient appears to be doing better with regard to her original and largest wound. 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 I explained that pyoderma is incompletely understood and obviously very difficult sometimes to treat but often steroid therapy is the mainstay of treatment. I think topical steroid treatment is appropriate  at this point. She does have mometasone which has been prescribed previously by her primary care provider and she has that with her so we can use that on some of the satellite areas not on the main wound. The patient is in agreement with that plan. 02/17/2020 upon evaluation today patient appears to be doing well with regard to her wound. She is making good progress with the snap VAC actually believe that she is  in a good position for [place] to be able to continue the snap VAC but actually at the Apligraf to try to speed up the granulation and epithelial growth. The patient is actually in agreement with that she wants to try to get this healed as quickly as possible I completely agree with her. She did fill up the canister again today. 03/02/2020 upon evaluation today patient appears to be doing well at this time with regard to her wound. In fact this is dramatically improved with the use of the snap VAC and the Apligraf even since the last time I saw her 2 weeks ago. Again week she had a nurse visit and changed out the snap VAC last week. Overall I think that she is definitely in a much better place and this wound is definitely making progress measuring smaller and overall I think progressing quite well. 7/13; wound surface looks very healthy. She has a small superior satellite lesion as well. Apligraf applied with a snap VAC. 03/30/2020 on evaluation today patient presents for follow-up concerning her lower extremity ulcers. Fortunately there is no signs of active infection at this time which is great news she has been tolerating the dressing changes without complication. Fortunately there is no evidence of anything really worsening the smaller wound more proximal does have some slough noted that is can require sharp debridement today however the main wound for which we have been applying Apligraf and the wound VAC seems to be doing quite well. I am extremely pleased with where things stand today. 04/13/2020 upon evaluation today patient appears to be doing much better in regard to the larger of her 2 wounds. I think we do not even really need the snap VAC any longer at this point. With regard to the smaller wound I think that this is good to be something that will continue to be beneficial as far as using the snap VAC on is concerned. We are going to reapply the Apligraf today. 04/27/2020 on evaluation today  patient appears to be doing excellent in regard to both of her wounds. The snap VAC help the smaller area to feeling quite well and overall I think were very close to getting both to close. I believe collagen may be a good option at this point. 05/04/2020 upon evaluation today patient appears to be doing very well in regard to her wounds. Everything is measuring smaller and seems to be doing quite well. Overall very pleased with where things stand and the patient is extremely happy. Both wounds are getting closer and closer to complete closure and the smaller of the 2 is almost completely healed. Overall very excited for her. Objective Constitutional Well-nourished and well-hydrated in no acute distress. Vitals Time Taken: 8:03 AM, Height: 66 in, Weight: 156 lbs, BMI: 25.2, Temperature: 98.1 F, Pulse: 92 bpm, Respiratory Rate: 18 breaths/min, Blood Pressure: 128/85 mmHg. Respiratory normal breathing without difficulty. Psychiatric this patient is able to make decisions and demonstrates good insight into disease process. Alert and Oriented x 3. pleasant and cooperative. General Notes: Patient's  wound currently showed signs of some good granulation at this time. Fortunately there does not appear to be any evidence of significant slough buildup I do not think she needs to drawtex at this point Integumentary (Hair, Skin) Wound #2 status is Open. Original cause of wound was Trauma. The wound is located on the Left,Anterior Lower Leg. The wound measures 2.1cm length x 0.9cm width x 0.1cm depth; 1.484cm^2 area and 0.148cm^3 volume. There is Fat Layer (Subcutaneous Tissue) exposed. There is no tunneling or undermining noted. There is a medium amount of serosanguineous drainage noted. The wound margin is distinct with the outline attached to the wound base. There is large (67-100%) red, pink granulation within the wound bed. There is no necrotic tissue within the wound bed. Wound #4 status is Open.  Original cause of wound was Gradually Appeared. The wound is located on the Left,Proximal,Anterior Lower Leg. The wound measures 0.5cm length x 0.3cm width x 0.1cm depth; 0.118cm^2 area and 0.012cm^3 volume. There is Fat Layer (Subcutaneous Tissue) exposed. There is no tunneling or undermining noted. There is a small amount of serosanguineous drainage noted. The wound margin is well defined and not attached to the wound base. There is large (67-100%) pink granulation within the wound bed. There is no necrotic tissue within the wound bed. 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 Dressing Change Frequency: Wound #2 Left,Anterior Lower Leg: Other: - 2 times per week Wound #4 Left,Proximal,Anterior Lower Leg: Other: - 2 times per week Skin Barriers/Peri-Wound Care: Moisturizing lotion TCA Cream or Ointment - or menometasone cream to irritated periwound skin Wound Cleansing: Wound #2 Left,Anterior Lower Leg: May shower with protection. Primary Wound Dressing: Wound #2 Left,Anterior Lower Leg: Silver Collagen Wound #4 Left,Proximal,Anterior Lower Leg: Silver Collagen Secondary Dressing: Wound #2 Left,Anterior Lower Leg: Dry Gauze Wound #4 Left,Proximal,Anterior Lower Leg: Dry Gauze Edema Control: Kerlix and Coban - Left Lower Extremity Avoid standing for long periods of time Elevate legs to the level of the heart or above for 30 minutes daily and/or when sitting, a frequency of: - throughout the  day Exercise regularly 1. I would recommend that we continue with silver collagen I do not believe we need to drawtex any longer however there really is not that much drainage. 2. I am also can recommend that the patient continue with compression I think that is doing a great job for her and is keeping the edema under good control which is allowing the wounds to heal as well. 3. I am also can recommend she elevate her legs much as possible when she is able that is not working. She can also be as active as she needs to still stand long periods of time. We will see patient back for reevaluation in 1 week here in the clinic. If anything worsens or changes patient will contact our office for additional recommendations. Electronic Signature(s) Signed: 05/04/2020 9:53:28 AM By: Lenda Kelp PA-C Previous Signature: 05/04/2020 8:46:16 AM Version By: Lenda Kelp PA-C Previous Signature: 05/04/2020 8:44:49 AM Version By: Lenda Kelp PA-C Entered By: Lenda Kelp on 05/04/2020 09:53:27 -------------------------------------------------------------------------------- SuperBill Details Patient  Name: Date of Service: [name], Cheyenne B. 05/04/2020 Medical Record Number: [id number] Patient Account Number: [id number] Date of Birth/Sex: Treating RN: Nov 24, 1960 (59 y.o. [name] StandardF) Boehlein, Linda Primary Care Provider: Docia ChuckKoirala, Dibas Other Clinician: Referring Provider: Treating Provider/Extender: Richardo PriestStone III, Donny Heffern Koirala, Dibas Weeks in Treatment: 31 Diagnosis Coding ICD-10 Codes Code Description I87.2 Venous insufficiency (chronic) (peripheral) L97.822 Non-pressure chronic ulcer of other part of left lower leg with fat layer exposed L03.116 Cellulitis of left lower limb L97.828 Non-pressure chronic ulcer of other part of left lower leg with other specified severity L97.322 Non-pressure chronic ulcer of left ankle with fat layer exposed 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 I73.00 Raynaud's  syndrome without gangrene 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, unspecified Facility Procedures CPT4 Code: [id number] Description: 99214 - WOUND CARE VISIT-LEV 4 EST PT Modifier: Quantity: 1 Physician Procedures : CPT4 Code Description Modifier [id number] 99213 - WC PHYS LEVEL 3 - EST PT ICD-10 Diagnosis Description I87.2 Venous insufficiency (chronic) (peripheral) L97.822 Non-pressure chronic ulcer of other part of left lower leg with fat layer exposed L03.116  Cellulitis of left lower limb L97.828 Non-pressure chronic ulcer of other part of left lower leg with other specified severity Quantity: 1 Electronic Signature(s) Signed: 05/04/2020 9:53:48 AM By: Lenda KelpStone III, Makaley Storts PA-C Entered By: Lenda KelpStone III, Jarious Lyon on 05/04/2020 09:53:47

## 2020-05-11 ENCOUNTER — Encounter (HOSPITAL_BASED_OUTPATIENT_CLINIC_OR_DEPARTMENT_OTHER): Payer: Worker's Compensation | Admitting: Physician Assistant

## 2020-05-11 DIAGNOSIS — L97828 Non-pressure chronic ulcer of other part of left lower leg with other specified severity: Secondary | ICD-10-CM | POA: Diagnosis not present

## 2020-05-11 NOTE — Progress Notes (Signed)
RYA, RAUSCH ([id number]) Visit Report for 05/11/2020 Chief Complaint Document Details Patient Name: Date of Service: [name], [name] 05/11/2020 8:00 A M Medical Record Number: [id number] Patient Account Number: [id number] Date of Birth/Sex: Treating RN: 12-Jan-1961 (59 y.o. [name] Standard Primary Care Provider: Docia Chuck, [place] Other Clinician: Referring Provider: Treating Provider/Extender: Richardo Priest, [place] Weeks in Treatment: 41 Information Obtained from: Patient Chief Complaint Left LE Ulcers Electronic Signature(s) Signed: 05/11/2020 8:34:12 AM By: Lenda Kelp PA-C Entered By: Lenda Kelp on 05/11/2020 08:34:12 -------------------------------------------------------------------------------- HPI Details Patient Name: Date of Service: Cheyenne Aran B. 05/11/2020 8:00 A M Medical Record Number: [id number] Patient Account Number: [id number] Date of Birth/Sex: Treating RN: 24-Jun-1961 (59 y.o. [name] Standard Primary Care Provider: Docia Chuck, [place] Other Clinician: Referring Provider: Treating Provider/Extender: Richardo Priest, [place] Weeks in Treatment: 32 History of Present Illness HPI Description: 09/30/2019 upon evaluation today patient presents for initial inspection here in our clinic concerning issues that she has been experiencing with a wound on her left anterior lower leg. She unfortunately had a significant laceration which required 19 sutures and she shows me the pictures both before as well as after sutured in a very good job was done in this regard. Unfortunately the skin did not take and necrosis. Subsequently this wound open and she has been having more significant issues with that since. Fortunately there is no evidence of active infection at this time which is good news. No fever chills noted. She has a history of chronic venous stasis but is no longer able to wear any compression in fact she cannot even wear regular socks due to  her neuropathy and the pain that she experiences in her feet if she does. For that reason she tries to elevate her legs as much as she can at nighttime but again she really cannot wear any type of compression. With regard to antibiotic it does not appear that she has been on anything recently. 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 Fortunately there is no evidence of local or systemic infection. No fevers, chills, nausea, vomiting, or diarrhea. 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 10/07/2019 on evaluation today patient presents for follow-up concerning her wound on the left lower extremity. She has been tolerating the dressing changes without complication. With that being said she just got the Santyl 2 days ago. Obviously it has not had enough time to really do what we need to do she would like to obviously give this some time before proceeding with more aggressive debridement or otherwise. 10/21/2019 upon evaluation today patient appears to be doing better with regard to the overall appearance of her wound is not quite as dry I do believe the Santyl has helped her improve to some degree here. Fortunately there is no signs of active infection at this time. I think we may be able to clean off the wound with a little bit of additional debridement today and likely we can transition away from the Santyl to some kind of collagen type dressing. 10/28/2019 upon evaluation today patient actually appears to be doing fairly well with regard to her wound today. She has been tolerating the dressing changes  we did switch to collagen and fortunately that seems to be doing well for her at this point. There does not seem to be any signs of active infection I do feel like the  surface of the wound is looking better at this time as well. 11/04/2019 upon evaluation today patient appears to be doing well with regard to her wound. She is making some progress here and the wound does appear to be measuring smaller which is good news. Overall this is good to be somewhat slow but nonetheless I am encouraged by the fact that the collagen does seem to have helped her. 11/11/2019 on evaluation today patient's wound is showing some signs of improvement. The good news is her overall trend does seem to be towards getting better compared to where she was previous. The smaller wounds along the side actually might even be healed but we can watch this 1 more week before I heal this out. Overall I feel like there is improvement here. She is still using the silver collagen. 11/18/2019 upon evaluation today patient appears to be doing about the same with regard to her wound. This is not significantly smaller there still appears to be a lot of inflammation. For that reason I do want to see about actually applying triamcinolone to the actual wound bed to see if this can be of benefit as well. I thought about this for couple weeks but I think it may be a good thing to proceed with at this point. 11/25/2019 on evaluation today patient appears to be doing well with regard to her lower extremity ulcer. She has been tolerating the dressing changes without complication. Fortunately there is no signs of active infection at this time. No fevers, chills, nausea, vomiting, or diarrhea. 12/02/2019 on evaluation today patient appears to be doing okay with regard to her ulcer on the leg. Fortunately there is no signs of active infection at this time. No fever chills noted she has been tolerating the dressing changes without complication. With that being said overall I am pleased with the fact that there is no signs of infection but at the same time I really feel like she needs more to progress moving week to week  and right now were really not seeing that. We have put in for approval for Apligraf which I think could help to move things along. With that being said we have not heard anything regarding approval at this point. 12/10/2019; patient comes in on my day although have not seen her previously. She has what was a traumatic wound on the left anterior mid tibia. Initially sutured however the area never really healed according to the patient. She has been using Santyl now using Prisma. There is exposed tendon. We have made application for Apligraf apparently this is going through [employer] still has not been approved. She arrives in clinic with erythema around the wound and 4 small circular papules superior to the wound. These are nontender and she is not complaining of any more pain. The exact cause of this is not really clear. The patient has idiopathic peripheral neuropathy which for which she has been extensively evaluated in the past she is not a diabetic she does not have any rheumatologic problems she is aware of. She cannot wear compression because it aggravates her neuropathy 4/15; traumatic wound on the left anterior mid tibia. She arrives today with extensive erythema spreading from the wound medially towards her heel. This is tender swollen and warm compatible with cellulitis.  We are using silver collagen to the wound 4/20; 5-day follow-up. Traumatic wound on the left anterior mid tibia. This is in follow-up for the cellulitis she had on her visit 5 days ago. I gave her empiric cephalexin I did not culture this. She arrives today with the erythema that we marked much improved. She has not been systemically unwell Upon evaluation today patient's wound unfortunately is not doing very well this in fact is somewhat deeper than even last week4/28/2021 which is unfortunate. I am very concerned about the fact that to be honest that the patient does not seem to be doing as good as we would have  expected in the past 2 weeks since I last saw her. Fortunately there is no evidence of active infection systemically which is good news. No fevers, chills, nausea, vomiting, or diarrhea. She has been on Keflex. 01/06/2020 upon evaluation today patient unfortunately appears to still be doing somewhat poorly overall in regard to her ulcer. There is definite signs of infection. She has been on Keflex in April but unfortunately this just does not seem to be doing as well as we would like. I do believe that we will get a need to place her on Augmentin in order to try to get this better. She tested positive for Proteus and E. coli on the culture and is allergic to doxycycline. The patient's wound currently is not looking healthy at all to me. 01/27/2020 upon evaluation today patient appears to be doing maybe slightly better with regard to the wound bed at this point. Her infection is definitely improved which is great news. As far as the Apligraf I do not believe the wound surface is quite clean enough yet in order for [place] to apply the Apligraf. Another potential option would be for a snap VAC to be used under the Curlex and Coban wrap working to try the wrap today. If we can get rid of some of the fluid I think this will help her with healing also think the snap VAC could be beneficial in this regard. Nonetheless again I am not given up on the Apligraf is a possibility I just do not want a waste the applications by putting it on before the wound surface is ready. Potentially the snap VAC can get this wound to the point where it is ready much more quickly. 02/03/2020 upon evaluation today patient appears to be doing excellent with regard to her leg ulcer compared to where things have been. The overall quality of the wound bed is significantly improved and very pleased in this regard. We did get approval for the snap VAC which I am wanting to go ahead and see about initiating at this point. We also have approval  for the Apligraf when and if the time is appropriate for this. With that being said I really think using the snap VAC to try to get this to fill-in would be appropriate to begin with. The patient is in agreement with that plan. I do believe the compression did help her and she states that that was uncomfortable she was able to manage with that for now. 02/10/2020 upon evaluation today patient appears to be doing better with regard to her original and largest wound. 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  I explained that pyoderma is incompletely understood and obviously very difficult sometimes to treat but often steroid therapy is the mainstay of treatment. I think topical steroid treatment is appropriate at this point. She does have mometasone which has been prescribed previously by her primary care provider and she has that with her so we can use that on some of the satellite areas not on the main wound. The patient is in agreement with that plan. 02/17/2020 upon evaluation today patient appears to be doing well with regard to her wound. She is making good progress with the snap VAC actually believe that she is in a good position for [place] to be able to continue the snap VAC but actually at the Apligraf to try to speed up the granulation and epithelial growth. The patient is actually in agreement with that she wants to try to get this healed as quickly as possible I completely agree with her. She did fill up the canister again today. 03/02/2020 upon evaluation today patient appears to be doing well at this time with regard to her wound. In fact this is dramatically improved with the use of the snap VAC and the Apligraf even since the last time I saw her 2 weeks ago. Again week she had a nurse visit and changed  out the snap VAC last week. Overall I think that she is definitely in a much better place and this wound is definitely making progress measuring smaller and overall I think progressing quite well. 7/13; wound surface looks very healthy. She has a small superior satellite lesion as well. Apligraf applied with a snap VAC. 03/30/2020 on evaluation today patient presents for follow-up concerning her lower extremity ulcers. Fortunately there is no signs of active infection at this time which is great news she has been tolerating the dressing changes without complication. Fortunately there is no evidence of anything really worsening the smaller wound more proximal does have some slough noted that is can require sharp debridement today however the main wound for which we have been applying Apligraf and the wound VAC seems to be doing quite well. I am extremely pleased with where things stand today. 04/13/2020 upon evaluation today patient appears to be doing much better in regard to the larger of her 2 wounds. I think we do not even really need the snap VAC any longer at this point. With regard to the smaller wound I think that this is good to be something that will continue to be beneficial as far as using the snap VAC on is concerned. We are going to reapply the Apligraf today. 04/27/2020 on evaluation today patient appears to be doing excellent in regard to both of her wounds. The snap VAC help the smaller area to feeling quite well and overall I think were very close to getting both to close. I believe collagen may be a good option at this point. 05/04/2020 upon evaluation today patient appears to be doing very well in regard to her wounds. Everything is measuring smaller and seems to be doing quite well. Overall very pleased with where things stand and the patient is extremely happy. Both wounds are getting closer and closer to complete closure and the smaller of the 2 is almost completely healed. Overall  very excited for her. 05/11/2020 on evaluation today patient appears to be doing well with regard to her wound. She has been tolerating the dressing changes without complication the Prisma has done excellent for her. The smaller of the 2 does  appear to be completely healed the larger is significantly improved and overall I am very pleased with where things stand today. There is no evidence of active infection at this time. Electronic Signature(s) Signed: 05/11/2020 8:34:22 AM By: Lenda Kelp PA-C Entered By: Lenda Kelp on 05/11/2020 08:34:21 -------------------------------------------------------------------------------- Physical Exam Details Patient Name: Date of Service: [name], [name] 05/11/2020 8:00 A M Medical Record Number: [id number] Patient Account Number: [id number] Date of Birth/Sex: Treating RN: 09-12-1960 (59 y.o. [name] Standard Primary Care Provider: Docia Chuck, [place] Other Clinician: Referring Provider: Treating Provider/Extender: Richardo Priest, [place] Weeks in Treatment: 81 Constitutional Well-nourished and well-hydrated in no acute distress. Respiratory normal breathing without difficulty. Psychiatric this patient is able to make decisions and demonstrates good insight into disease process. Alert and Oriented x 3. pleasant and cooperative. Notes His wound bed currently showed signs of good granulation at this point there does not appear to be any evidence of active infection and overall I am extremely happy with the progress in healing as far as epithelization and granulation that she is experiencing at this point. Electronic Signature(s) Signed: 05/11/2020 8:34:45 AM By: Lenda Kelp PA-C Entered By: Lenda Kelp on 05/11/2020 08:34:45 -------------------------------------------------------------------------------- Physician Orders Details Patient Name: Date of Service: Cheyenne Aran B. 05/11/2020 8:00 A M Medical Record Number: [id number] Patient  Account Number: [id number] Date of Birth/Sex: Treating RN: [birth year] (59 y.o. 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 Dressing Change Frequency Wound #2 Left,Anterior Lower Leg Other: - 2 times per week Skin Barriers/Peri-Wound Care Moisturizing lotion TCA Cream or Ointment - or menometasone cream to irritated periwound skin Wound Cleansing Wound #2 Left,Anterior Lower Leg May shower with protection. Primary Wound Dressing Wound #2 Left,Anterior Lower Leg Silver Collagen Secondary Dressing Wound #2 Left,Anterior Lower Leg Dry Gauze Edema Control Kerlix and Coban - Left Lower Extremity Avoid standing for long periods of time Elevate legs to the level of the heart or above for 30 minutes daily and/or when sitting, a frequency of: - throughout the day Exercise regularly Electronic Signature(s) Signed: 05/11/2020 5:28:47 PM By: Zenaida Deed RN, BSN Signed: 05/11/2020 5:46:37 PM By: Lenda Kelp PA-C Entered By: Zenaida Deed on 05/11/2020 08:31:19 -------------------------------------------------------------------------------- Problem List Details Patient Name: Date of Service: Cheyenne Aran B. 05/11/2020 8:00 A M Medical  Record Number: [id number] Patient Account Number: [id number] Date of Birth/Sex: Treating RN: 03-Jan-1961 (59 y.o. [name] Standard Primary Care Provider: [place], [place] Other Clinician: Referring Provider: Treating Provider/Extender: Richardo Priest, [place] Weeks in Treatment: 37 Active Problems ICD-10 Encounter Code Description Active Date MDM Diagnosis I87.2 Venous insufficiency (chronic) (peripheral) 09/30/2019 No Yes L97.822 Non-pressure chronic ulcer of other part of left lower leg with fat layer exposed 09/30/2019 No Yes L03.116 Cellulitis of left lower limb 12/17/2019 No Yes L97.828 Non-pressure chronic ulcer of other part of left lower leg with other specified 09/30/2019 No Yes severity L97.322  Non-pressure chronic ulcer of left ankle with fat layer exposed 02/03/2020 No Yes 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 05/11/2020 8:00 A M Medical Record Number: [id number] Patient Account Number: [id number] Date of Birth/Sex: Treating RN: 1961-01-11 (59 y.o. [name] StandardF) Boeh[place], Linda Primary Care Provider: Docia ChuckKoir[place], [place] Other Clinician: Referring Provider: Treating Provider/Extender: Richardo PriestStone III, Reeve Turnley [place], [place] Weeks in Treatment: 1832 Subjective Chief Complaint Information obtained from Patient Left LE Ulcers History of Present Illness (HPI) 09/30/2019 upon evaluation today patient presents for initial inspection here in our clinic concerning issues that she has been experiencing with a wound on her left  anterior lower leg. She unfortunately had a significant laceration which required 19 sutures and she shows me the pictures both before as well as after sutured in a very good job was done in this regard. Unfortunately the skin did not take and necrosis. Subsequently this wound open and she has been having more significant issues with that since. Fortunately there is no evidence of active infection at this time which is good news. No fever chills noted. She has a history of chronic venous stasis but is no longer able to wear any compression in fact she cannot even wear regular socks due to her neuropathy and the pain that she experiences in her feet if she does. For that reason she tries to elevate her legs as much as she can at nighttime but again she really cannot wear any type of compression. With regard to antibiotic it does not appear that she has been on anything recently. 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 Fortunately there is no evidence of local or systemic infection. No fevers, chills, nausea, vomiting, or diarrhea. 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 10/07/2019 on evaluation today patient presents for follow-up concerning her wound on the left lower extremity. She has been tolerating the dressing changes without complication. With that being said she just got the Santyl 2 days ago. Obviously it has not had enough time to really do what we need to do she would like to obviously give this some time before proceeding with more aggressive debridement or otherwise. 10/21/2019 upon evaluation today patient appears to be doing better with regard to the overall appearance of her wound is not quite as dry I do believe the Santyl has helped her improve to  some degree here. Fortunately there is no signs of active infection at this time. I think we may be able to clean off the wound with a little bit of additional debridement today and likely we can transition away from the Santyl to some kind of collagen type dressing. 10/28/2019 upon evaluation today patient actually appears to be doing fairly well with regard to her wound today. She has been tolerating the dressing changes we did switch to collagen and fortunately that seems to be doing well for her at this point. There  does not seem to be any signs of active infection I do feel like the surface of the wound is looking better at this time as well. 11/04/2019 upon evaluation today patient appears to be doing well with regard to her wound. She is making some progress here and the wound does appear to be measuring smaller which is good news. Overall this is good to be somewhat slow but nonetheless I am encouraged by the fact that the collagen does seem to have helped her. 11/11/2019 on evaluation today patient's wound is showing some signs of improvement. The good news is her overall trend does seem to be towards getting better compared to where she was previous. The smaller wounds along the side actually might even be healed but we can watch this 1 more week before I heal this out. Overall I feel like there is improvement here. She is still using the silver collagen. 11/18/2019 upon evaluation today patient appears to be doing about the same with regard to her wound. This is not significantly smaller there still appears to be a lot of inflammation. For that reason I do want to see about actually applying triamcinolone to the actual wound bed to see if this can be of benefit as well. I thought about this for couple weeks but I think it may be a good thing to proceed with at this point. 11/25/2019 on evaluation today patient appears to be doing well with regard to her lower extremity ulcer. She has been  tolerating the dressing changes without complication. Fortunately there is no signs of active infection at this time. No fevers, chills, nausea, vomiting, or diarrhea. 12/02/2019 on evaluation today patient appears to be doing okay with regard to her ulcer on the leg. Fortunately there is no signs of active infection at this time. No fever chills noted she has been tolerating the dressing changes without complication. With that being said overall I am pleased with the fact that there is no signs of infection but at the same time I really feel like she needs more to progress moving week to week and right now were really not seeing that. We have put in for approval for Apligraf which I think could help to move things along. With that being said we have not heard anything regarding approval at this point. 12/10/2019; patient comes in on my day although have not seen her previously. She has what was a traumatic wound on the left anterior mid tibia. Initially sutured however the area never really healed according to the patient. She has been using Santyl now using Prisma. There is exposed tendon. We have made application for Apligraf apparently this is going through [employer] still has not been approved. She arrives in clinic with erythema around the wound and 4 small circular papules superior to the wound. These are nontender and she is not complaining of any more pain. The exact cause of this is not really clear. The patient has idiopathic peripheral neuropathy which for which she has been extensively evaluated in the past she is not a diabetic she does not have any rheumatologic problems she is aware of. She cannot wear compression because it aggravates her neuropathy 4/15; traumatic wound on the left anterior mid tibia. She arrives today with extensive erythema spreading from the wound medially towards her heel. This is tender swollen and warm compatible with cellulitis. We are using silver  collagen to the wound 4/20; 5-day follow-up. Traumatic wound on the left anterior mid tibia.  This is in follow-up for the cellulitis she had on her visit 5 days ago. I gave her empiric cephalexin I did not culture this. She arrives today with the erythema that we marked much improved. She has not been systemically unwell Upon evaluation today patient's wound unfortunately is not doing very well this in fact is somewhat deeper than even last week4/28/2021 which is unfortunate. I am very concerned about the fact that to be honest that the patient does not seem to be doing as good as we would have expected in the past 2 weeks since I last saw her. Fortunately there is no evidence of active infection systemically which is good news. No fevers, chills, nausea, vomiting, or diarrhea. She has been on Keflex. 01/06/2020 upon evaluation today patient unfortunately appears to still be doing somewhat poorly overall in regard to her ulcer. There is definite signs of infection. She has been on Keflex in April but unfortunately this just does not seem to be doing as well as we would like. I do believe that we will get a need to place her on Augmentin in order to try to get this better. She tested positive for Proteus and E. coli on the culture and is allergic to doxycycline. The patient's wound currently is not looking healthy at all to me. 01/27/2020 upon evaluation today patient appears to be doing maybe slightly better with regard to the wound bed at this point. Her infection is definitely improved which is great news. As far as the Apligraf I do not believe the wound surface is quite clean enough yet in order for [place] to apply the Apligraf. Another potential option would be for a snap VAC to be used under the Curlex and Coban wrap working to try the wrap today. If we can get rid of some of the fluid I think this will help her with healing also think the snap VAC could be beneficial in this regard. Nonetheless again I  am not given up on the Apligraf is a possibility I just do not want a waste the applications by putting it on before the wound surface is ready. Potentially the snap VAC can get this wound to the point where it is ready much more quickly. 02/03/2020 upon evaluation today patient appears to be doing excellent with regard to her leg ulcer compared to where things have been. The overall quality of the wound bed is significantly improved and very pleased in this regard. We did get approval for the snap VAC which I am wanting to go ahead and see about initiating at this point. We also have approval for the Apligraf when and if the time is appropriate for this. With that being said I really think using the snap VAC to try to get this to fill-in would be appropriate to begin with. The patient is in agreement with that plan. I do believe the compression did help her and she states that that was uncomfortable she was able to manage with that for now. 02/10/2020 upon evaluation today patient appears to be doing better with regard to her original and largest wound. 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 I explained that pyoderma is incompletely understood and obviously very difficult sometimes to treat but often steroid therapy is the  mainstay of treatment. I think topical steroid treatment is appropriate at this point. She does have mometasone which has been prescribed previously by her primary care provider and she has that with her so we can use that on some of the satellite areas not on the main wound. The patient is in agreement with that plan. 02/17/2020 upon evaluation today patient appears to be doing well with regard to her wound. She is making good progress with the snap VAC actually believe that she is in a good  position for [place] to be able to continue the snap VAC but actually at the Apligraf to try to speed up the granulation and epithelial growth. The patient is actually in agreement with that she wants to try to get this healed as quickly as possible I completely agree with her. She did fill up the canister again today. 03/02/2020 upon evaluation today patient appears to be doing well at this time with regard to her wound. In fact this is dramatically improved with the use of the snap VAC and the Apligraf even since the last time I saw her 2 weeks ago. Again week she had a nurse visit and changed out the snap VAC last week. Overall I think that she is definitely in a much better place and this wound is definitely making progress measuring smaller and overall I think progressing quite well. 7/13; wound surface looks very healthy. She has a small superior satellite lesion as well. Apligraf applied with a snap VAC. 03/30/2020 on evaluation today patient presents for follow-up concerning her lower extremity ulcers. Fortunately there is no signs of active infection at this time which is great news she has been tolerating the dressing changes without complication. Fortunately there is no evidence of anything really worsening the smaller wound more proximal does have some slough noted that is can require sharp debridement today however the main wound for which we have been applying Apligraf and the wound VAC seems to be doing quite well. I am extremely pleased with where things stand today. 04/13/2020 upon evaluation today patient appears to be doing much better in regard to the larger of her 2 wounds. I think we do not even really need the snap VAC any longer at this point. With regard to the smaller wound I think that this is good to be something that will continue to be beneficial as far as using the snap VAC on is concerned. We are going to reapply the Apligraf today. 04/27/2020 on evaluation today patient appears  to be doing excellent in regard to both of her wounds. The snap VAC help the smaller area to feeling quite well and overall I think were very close to getting both to close. I believe collagen may be a good option at this point. 05/04/2020 upon evaluation today patient appears to be doing very well in regard to her wounds. Everything is measuring smaller and seems to be doing quite well. Overall very pleased with where things stand and the patient is extremely happy. Both wounds are getting closer and closer to complete closure and the smaller of the 2 is almost completely healed. Overall very excited for her. 05/11/2020 on evaluation today patient appears to be doing well with regard to her wound. She has been tolerating the dressing changes without complication the Prisma has done excellent for her. The smaller of the 2 does appear to be completely healed the larger is significantly improved and overall I am very pleased with where things stand  today. There is no evidence of active infection at this time. Objective Constitutional Well-nourished and well-hydrated in no acute distress. Vitals Time Taken: 8:05 AM, Height: 66 in, Weight: 156 lbs, BMI: 25.2, Temperature: 97.9 F, Pulse: 76 bpm, Respiratory Rate: 16 breaths/min, Blood Pressure: 114/86 mmHg. Respiratory normal breathing without difficulty. Psychiatric this patient is able to make decisions and demonstrates good insight into disease process. Alert and Oriented x 3. pleasant and cooperative. General Notes: His wound bed currently showed signs of good granulation at this point there does not appear to be any evidence of active infection and overall I am extremely happy with the progress in healing as far as epithelization and granulation that she is experiencing at this point. Integumentary (Hair, Skin) Wound #2 status is Open. Original cause of wound was Trauma. The wound is located on the Left,Anterior Lower Leg. The wound measures 2cm  length x 0.7cm width x 0.2cm depth; 1.1cm^2 area and 0.22cm^3 volume. There is Fat Layer (Subcutaneous Tissue) exposed. There is no tunneling or undermining noted. There is a medium amount of serosanguineous drainage noted. The wound margin is distinct with the outline attached to the wound base. There is large (67- 100%) red, pink granulation within the wound bed. There is no necrotic tissue within the wound bed. Wound #4 status is Healed - Epithelialized. Original cause of wound was Gradually Appeared. The wound is located on the Left,Proximal,Anterior Lower Leg. The wound measures 0cm length x 0cm width x 0cm depth; 0cm^2 area and 0cm^3 volume. There is Fat Layer (Subcutaneous Tissue) exposed. There is no tunneling or undermining noted. There is a small amount of serosanguineous drainage noted. The wound margin is well defined and not attached to the wound base. There is large (67-100%) pink granulation within the wound bed. There is no necrotic tissue within the wound bed. 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 Dressing Change Frequency: Wound #2 Left,Anterior Lower Leg: Other: - 2 times per week Skin Barriers/Peri-Wound Care: Moisturizing lotion TCA Cream or Ointment - or menometasone cream to irritated periwound skin Wound Cleansing: Wound #2 Left,Anterior Lower Leg: May shower with protection. Primary Wound Dressing: Wound #2 Left,Anterior Lower Leg: Silver Collagen Secondary Dressing: Wound #2  Left,Anterior Lower Leg: Dry Gauze Edema Control: Kerlix and Coban - Left Lower Extremity Avoid standing for long periods of time Elevate legs to the level of the heart or above for 30 minutes daily and/or when sitting, a frequency of: - throughout the day Exercise regularly 1. I would recommend that we going to continue with the current wound care measures using the silver collagen for the one remaining wound this is doing a great job and is measuring significantly smaller. 2. I am also can recommend that we continue with the compression wrap. We have been using currently a Kerlix and Coban wrap to left lower extremity that has been helpful. 3. She will continue to elevate her legs as well and exercise regularly I think walking is fine however she should not stand in a single place long periods of time. We will see patient back for reevaluation in 1 week here in  the clinic. If anything worsens or changes patient will contact our office for additional recommendations. Electronic Signature(s) Signed: 05/11/2020 8:35:16 AM By: Lenda Kelp PA-C Entered By: Lenda Kelp on 05/11/2020 08:35:15 -------------------------------------------------------------------------------- SuperBill Details Patient Name: Date of Service: [name] 05/11/2020 Medical Record Number: [id number] Patient Account Number: [id number] Date of Birth/Sex: Treating RN: May 31, 1961 (59 y.o. [name] Standard Primary Care Provider: Docia Chuck, [place] Other Clinician: Referring Provider: Treating Provider/Extender: Richardo Priest, [place] Weeks in Treatment: 32 Diagnosis Coding ICD-10 Codes Code Description I87.2 Venous insufficiency (chronic) (peripheral) L97.822 Non-pressure chronic ulcer of other part of left lower leg with fat layer exposed L03.116 Cellulitis of left lower limb L97.828 Non-pressure chronic ulcer of other part of left lower leg with other specified severity L97.322 Non-pressure  chronic ulcer of left ankle with fat layer exposed 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 I73.00 Raynaud's syndrome without gangrene 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, unspecified Facility Procedures CPT4 Code: [id number] Description: 99213 - WOUND CARE VISIT-LEV 3 EST PT Modifier: Quantity: 1 Physician Procedures : CPT4 Code Description Modifier [id number] 99213 - WC PHYS LEVEL 3 - EST PT ICD-10 Diagnosis Description I87.2 Venous insufficiency (chronic) (peripheral) L03.116 Cellulitis of left lower limb L97.822 Non-pressure chronic ulcer of other part of left lower  leg with fat layer exposed L97.828 Non-pressure chronic ulcer of other part of left lower leg with other specified severity Quantity: 1 Electronic Signature(s) Signed: 05/11/2020 8:35:28 AM By: Lenda Kelp PA-C Entered By: Lenda Kelp on 05/11/2020 08:35:28

## 2020-05-11 NOTE — Progress Notes (Signed)
[name], [name] ([id number]) Visit Report for 05/11/2020 Arrival Information Details Patient Name: Date of Service: [name], [name] 05/11/2020 8:00 A M Medical Record Number: [id number] Patient Account Number: [id number] Date of Birth/Sex: Treating RN: 07/07/1961 (59 y.o. [name] Primary Care Dartanyan Deasis: [name], [place] Other Clinician: Referring Dayna Geurts: Treating Jayion Schneck/Extender: Dayna Ramus, [place] Weeks in Treatment: 39 Visit Information History Since Last Visit Added or deleted any medications: No Patient Arrived: Ambulatory Any new allergies or adverse reactions: No Arrival Time: 08:04 Had a fall or experienced change in No Accompanied By: self activities of daily living that may affect Transfer Assistance: None risk of falls: Patient Identification Verified: Yes Signs or symptoms of abuse/neglect since last visito No Secondary Verification Process Completed: Yes Hospitalized since last visit: No Patient Requires Transmission-Based Precautions: No Implantable device outside of the clinic excluding No Patient Has Alerts: Yes cellular tissue based products placed in the center since last visit: Has Dressing in Place as Prescribed: No Pain Present Now: No Electronic Signature(s) Signed: 05/11/2020 5:39:04 PM By: Deon Pilling Entered By: Deon Pilling on 05/11/2020 08:06:21 -------------------------------------------------------------------------------- Clinic Level of Care Assessment Details Patient Name: Date of Service: [name] 05/11/2020 8:00 A M Medical Record Number: [id number] Patient Account Number: [id number] Date of Birth/Sex: Treating RN: 26-Aug-1961 (59 y.o. [name] Primary Care Eula Jaster: [name], [place] Other Clinician: Referring Yasir Kitner: Treating Shondell Poulson/Extender: Dayna Ramus, [place] Weeks in Treatment: 32 Clinic Level of Care Assessment Items TOOL 4 Quantity Score []  - 0 Use when only an EandM is performed  on FOLLOW-UP visit ASSESSMENTS - Nursing Assessment / Reassessment X- 1 10 Reassessment of Co-morbidities (includes updates in patient status) X- 1 5 Reassessment of Adherence to Treatment Plan ASSESSMENTS - Wound and Skin A ssessment / Reassessment []  - 0 Simple Wound Assessment / Reassessment - one wound X- 2 5 Complex Wound Assessment / Reassessment - multiple wounds []  - 0 Dermatologic / Skin Assessment (not related to wound area) ASSESSMENTS - Focused Assessment X- 1 5 Circumferential Edema Measurements - multi extremities []  - 0 Nutritional Assessment / Counseling / Intervention X- 1 5 Lower Extremity Assessment (monofilament, tuning fork, pulses) []  - 0 Peripheral Arterial Disease Assessment (using hand held doppler) ASSESSMENTS - Ostomy and/or Continence Assessment and Care []  - 0 Incontinence Assessment and Management []  - 0 Ostomy Care Assessment and Management (repouching, etc.) PROCESS - Coordination of Care X - Simple Patient / Family Education for ongoing care 1 15 []  - 0 Complex (extensive) Patient / Family Education for ongoing care X- 1 10 Staff obtains Programmer, systems, Records, T Results / Process Orders est []  - 0 Staff telephones HHA, Nursing Homes / Clarify orders / etc []  - 0 Routine Transfer to another Facility (non-emergent condition) []  - 0 Routine Hospital Admission (non-emergent condition) []  - 0 New Admissions / Biomedical engineer / Ordering NPWT Apligraf, etc. , []  - 0 Emergency Hospital Admission (emergent condition) X- 1 10 Simple Discharge Coordination []  - 0 Complex (extensive) Discharge Coordination PROCESS - Special Needs []  - 0 Pediatric / Minor Patient Management []  - 0 Isolation Patient Management []  - 0 Hearing / Language / Visual special needs []  - 0 Assessment of Community assistance (transportation, D/C planning, etc.) []  - 0 Additional assistance / Altered mentation []  - 0 Support Surface(s) Assessment (bed,  cushion, seat, etc.) INTERVENTIONS - Wound Cleansing / Measurement []  - 0 Simple Wound Cleansing - one wound X- 2 5 Complex Wound Cleansing - multiple wounds X- 1  5 Wound Imaging (photographs - any number of wounds) []  - 0 Wound Tracing (instead of photographs) []  - 0 Simple Wound Measurement - one wound X- 2 5 Complex Wound Measurement - multiple wounds INTERVENTIONS - Wound Dressings X - Small Wound Dressing one or multiple wounds 1 10 []  - 0 Medium Wound Dressing one or multiple wounds []  - 0 Large Wound Dressing one or multiple wounds X- 1 5 Application of Medications - topical []  - 0 Application of Medications - injection INTERVENTIONS - Miscellaneous []  - 0 External ear exam []  - 0 Specimen Collection (cultures, biopsies, blood, body fluids, etc.) []  - 0 Specimen(s) / Culture(s) sent or taken to Lab for analysis []  - 0 Patient Transfer (multiple staff / Civil Service fast streamer / Similar devices) []  - 0 Simple Staple / Suture removal (25 or less) []  - 0 Complex Staple / Suture removal (26 or more) []  - 0 Hypo / Hyperglycemic Management (close monitor of Blood Glucose) []  - 0 Ankle / Brachial Index (ABI) - do not check if billed separately X- 1 5 Vital Signs Has the patient been seen at the hospital within the last three years: Yes Total Score: 115 Level Of Care: New/Established - Level 3 Electronic Signature(s) Signed: 05/11/2020 5:28:47 PM By: Baruch Gouty RN, BSN Entered By: Baruch Gouty on 05/11/2020 08:32:26 -------------------------------------------------------------------------------- Encounter Discharge Information Details Patient Name: Date of Service: Cheyenne Mccallum B. 05/11/2020 8:00 A M Medical Record Number: [id number] Patient Account Number: [id number] Date of Birth/Sex: Treating RN: [birth year] (59 y.o. [name] Primary Care Ladanian Kelter: [name], [place] Other Clinician: Referring Mearl Harewood: Treating Eloyse Causey/Extender: Dayna Ramus,  [place] Weeks in Treatment: 57 Encounter Discharge Information Items Discharge Condition: Stable Ambulatory Status: Ambulatory Discharge Destination: Home Transportation: Private Auto Accompanied By: self Schedule Follow-up Appointment: Yes Clinical Summary of Care: Electronic Signature(s) Signed: 05/11/2020 5:39:04 PM By: Deon Pilling Entered By: Deon Pilling on 05/11/2020 08:47:30 -------------------------------------------------------------------------------- Lower Extremity Assessment Details Patient Name: Date of Service: [name], [name] 05/11/2020 8:00 A M Medical Record Number: [id number] Patient Account Number: [id number] Date of Birth/Sex: Treating RN: 1961-03-14 (59 y.o. [name] Primary Care Doreatha Offer: McDowell, [place] Other Clinician: Referring Nehemiah Mcfarren: Treating Lucill Mauck/Extender: Dayna Ramus, [place] Weeks in Treatment: 32 Edema Assessment Assessed: [Left: Yes] [Right: No] Edema: [Left: N] [Right: o] Calf Left: Right: Point of Measurement: 29 cm From Medial Instep 31.5 cm cm Ankle Left: Right: Point of Measurement: 8 cm From Medial Instep 21 cm cm Vascular Assessment Pulses: Dorsalis Pedis Palpable: [Left:Yes] Electronic Signature(s) Signed: 05/11/2020 5:39:04 PM By: Deon Pilling Entered By: Deon Pilling on 05/11/2020 08:07:06 -------------------------------------------------------------------------------- [place] Details Patient Name: Date of Service: Cheyenne Mccallum B. 05/11/2020 8:00 A M Medical Record Number: [id number] Patient Account Number: [id number] Date of Birth/Sex: Treating RN: 09/19/1960 (59 y.o. [name] Primary Care Makiah Clauson: [name], [place] Other Clinician: Referring Aleysia Oltmann: Treating Daveigh Batty/Extender: Dayna Ramus, [place] Weeks in Treatment: 77 Active Inactive Venous Leg Ulcer Nursing Diagnoses: Knowledge deficit related to disease process and management Potential for venous  Insuffiency (use before diagnosis confirmed) Goals: Patient will maintain optimal edema control Date Initiated: 09/30/2019 Target Resolution Date: 05/25/2020 Goal Status: Active Patient/caregiver will verbalize understanding of disease process and disease management Date Initiated: 09/30/2019 Date Inactivated: 11/25/2019 Target Resolution Date: 11/25/2019 Goal Status: Met Interventions: Assess peripheral edema status every visit. Compression as ordered Provide education on venous insufficiency Treatment Activities: Therapeutic compression applied : 09/30/2019 Notes: Wound/Skin Impairment Nursing Diagnoses: Impaired tissue integrity Knowledge deficit related  to ulceration/compromised skin integrity Goals: Patient/caregiver will verbalize understanding of skin care regimen Date Initiated: 09/30/2019 Target Resolution Date: 05/25/2020 Goal Status: Active Ulcer/skin breakdown will have a volume reduction of 30% by week 4 Date Initiated: 09/30/2019 Date Inactivated: 10/28/2019 Target Resolution Date: 10/28/2019 Goal Status: Unmet Unmet Reason: other comorbidities Interventions: Assess patient/caregiver ability to obtain necessary supplies Assess patient/caregiver ability to perform ulcer/skin care regimen upon admission and as needed Assess ulceration(s) every visit Provide education on ulcer and skin care Treatment Activities: Skin care regimen initiated : 09/30/2019 Topical wound management initiated : 09/30/2019 Notes: Electronic Signature(s) Signed: 05/11/2020 5:28:47 PM By: Baruch Gouty RN, BSN Entered By: Baruch Gouty on 05/11/2020 08:07:40 -------------------------------------------------------------------------------- Pain Assessment Details Patient Name: Date of Service: [name] 05/11/2020 8:00 A M Medical Record Number: [id number] Patient Account Number: [id number] Date of Birth/Sex: Treating RN: 07-18-1961 (59 y.o. [name] Primary Care Kayley Zeiders:  [name], [place] Other Clinician: Referring Jigar Zielke: Treating Elvert Cumpton/Extender: Dayna Ramus, [place] Weeks in Treatment: 13 Active Problems Location of Pain Severity and Description of Pain Patient Has Paino No Site Locations Rate the pain. Current Pain Level: 0 Pain Management and Medication Current Pain Management: Medication: No Cold Application: No Rest: No Massage: No Activity: No T.E.N.S.: No Heat Application: No Leg drop or elevation: No Is the Current Pain Management Adequate: Adequate How does your wound impact your activities of daily livingo Sleep: No Bathing: No Appetite: No Relationship With Others: No Bladder Continence: No Emotions: No Bowel Continence: No Work: No Toileting: No Drive: No Dressing: No Hobbies: No Electronic Signature(s) Signed: 05/11/2020 5:39:04 PM By: Deon Pilling Entered By: Deon Pilling on 05/11/2020 08:06:54 -------------------------------------------------------------------------------- Patient/Caregiver Education Details Patient Name: Date of Service: Jeanpaul, Naydelin B. 9/8/2021andnbsp8:00 A M Medical Record Number: [id number] Patient Account Number: [id number] Date of Birth/Gender: Treating RN: 1960-10-08 (59 y.o. [name] Primary Care Physician: [name], [place] Other Clinician: Referring Physician: Treating Physician/Extender: Dayna Ramus, [place] Weeks in Treatment: 87 Education Assessment Education Provided To: Patient Education Topics Provided Venous: Methods: Explain/Verbal Responses: Reinforcements needed, State content correctly Wound/Skin Impairment: Methods: Explain/Verbal Responses: Reinforcements needed, State content correctly Electronic Signature(s) Signed: 05/11/2020 5:28:47 PM By: Baruch Gouty RN, BSN Entered By: Baruch Gouty on 05/11/2020 08:07:59 -------------------------------------------------------------------------------- Wound Assessment Details Patient  Name: Date of Service: [name] 05/11/2020 8:00 A M Medical Record Number: [id number] Patient Account Number: [id number] Date of Birth/Sex: Treating RN: [birth year] (59 y.o. [name] Primary Care Kyng Matlock: [name], [place] Other Clinician: Referring Janea Schwenn: Treating Lavinia Mcneely/Extender: Dayna Ramus, [place] Weeks in Treatment: 32 Wound Status Wound Number: 2 Primary Etiology: Venous Leg Ulcer Wound Location: Left, Anterior Lower Leg Wound Status: Open Wounding Event: Trauma Comorbid History: Lymphedema, Raynauds, Neuropathy Date Acquired: 08/06/2019 Weeks Of Treatment: 32 Clustered Wound: No Wound Measurements Length: (cm) 2 Width: (cm) 0.7 Depth: (cm) 0.2 Area: (cm) 1.1 Volume: (cm) 0.22 % Reduction in Area: 94% % Reduction in Volume: 94% Epithelialization: Large (67-100%) Tunneling: No Undermining: No Wound Description Classification: Full Thickness With Exposed Support Structures Wound Margin: Distinct, outline attached Exudate Amount: Medium Exudate Type: Serosanguineous Exudate Color: red, brown Wound Bed Granulation Amount: Large (67-100%) Granulation Quality: Red, Pink Necrotic Amount: None Present (0%) Foul Odor After Cleansing: No Slough/Fibrino No Exposed Structure Fascia Exposed: No Fat Layer (Subcutaneous Tissue) Exposed: Yes Tendon Exposed: No Muscle Exposed: No Joint Exposed: No Bone Exposed: No Treatment Notes Wound #2 (Left, Anterior Lower Leg) 1. Cleanse With Saline 2. Periwound Care TCA Cream 3. Primary  Dressing Applied Collegen AG 4. Secondary Dressing Dry Gauze 6. Support Layer Holiday representative) Signed: 05/11/2020 5:39:04 PM By: Deon Pilling Entered By: Deon Pilling on 05/11/2020 08:07:43 -------------------------------------------------------------------------------- Wound Assessment Details Patient Name: Date of Service: [name], [name] 05/11/2020 8:00 A M Medical Record  Number: [id number] Patient Account Number: [id number] Date of Birth/Sex: Treating RN: 05-27-1961 (59 y.o. [name] Primary Care Dawsyn Ramsaran: [name], [place] Other Clinician: Referring Kelley Polinsky: Treating Gorje Iyer/Extender: Dayna Ramus, [place] Weeks in Treatment: 32 Wound Status Wound Number: 4 Primary Etiology: Venous Leg Ulcer Wound Location: Left, Proximal, Anterior Lower Leg Wound Status: Healed - Epithelialized Wounding Event: Gradually Appeared Comorbid History: Lymphedema, Raynauds, Neuropathy Date Acquired: 01/20/2020 Weeks Of Treatment: 15 Clustered Wound: No Wound Measurements Length: (cm) Width: (cm) Depth: (cm) Area: (cm) Volume: (cm) 0 % Reduction in Area: 100% 0 % Reduction in Volume: 100% 0 Epithelialization: Large (67-100%) 0 Tunneling: No 0 Undermining: No Wound Description Classification: Full Thickness Without Exposed Support Structures Wound Margin: Well defined, not attached Exudate Amount: Small Exudate Type: Serosanguineous Exudate Color: red, brown Foul Odor After Cleansing: No Slough/Fibrino No Wound Bed Granulation Amount: Large (67-100%) Exposed Structure Granulation Quality: Pink Fascia Exposed: No Necrotic Amount: None Present (0%) Fat Layer (Subcutaneous Tissue) Exposed: Yes Tendon Exposed: No Muscle Exposed: No Joint Exposed: No Bone Exposed: No Electronic Signature(s) Signed: 05/11/2020 5:28:47 PM By: Baruch Gouty RN, BSN Entered By: Baruch Gouty on 05/11/2020 08:29:04 -------------------------------------------------------------------------------- [place] Details Patient Name: Date of Service: Cheyenne Mccallum B. 05/11/2020 8:00 A M Medical Record Number: [id number] Patient Account Number: [id number] Date of Birth/Sex: Treating RN: 1960/10/25 (59 y.o. [name] Primary Care Eshawn Coor: [name], [place] Other Clinician: Referring Leverett Camplin: Treating Markia Kyer/Extender: Dayna Ramus, [place] Weeks in  Treatment: 32 Vital Signs Time Taken: 08:05 Temperature (F): 97.9 Height (in): 66 Pulse (bpm): 76 Weight (lbs): 156 Respiratory Rate (breaths/min): 16 Body Mass Index (BMI): 25.2 Blood Pressure (mmHg): 114/86 Reference Range: 80 - 120 mg / dl Electronic Signature(s) Signed: 05/11/2020 5:39:04 PM By: Deon Pilling Entered By: Deon Pilling on 05/11/2020 08:06:43

## 2020-05-18 ENCOUNTER — Encounter (HOSPITAL_BASED_OUTPATIENT_CLINIC_OR_DEPARTMENT_OTHER): Payer: Worker's Compensation | Admitting: Physician Assistant

## 2020-05-18 DIAGNOSIS — L97828 Non-pressure chronic ulcer of other part of left lower leg with other specified severity: Secondary | ICD-10-CM | POA: Diagnosis not present

## 2020-05-18 NOTE — Progress Notes (Signed)
EMMERIE, BATTAGLIA ([id number]) Visit Report for 05/18/2020 Chief Complaint Document Details Patient Name: Date of Service: [name], [name] 05/18/2020 8:00 A M Medical Record Number: [id number] Patient Account Number: [id number] Date of Birth/Sex: Treating RN: 09/22/1960 (59 y.o. [name] Standard Primary Care Provider: Docia Chuck, [place] Other Clinician: Referring Provider: Treating Provider/Extender: Richardo Priest, [place] Weeks in Treatment: 51 Information Obtained from: Patient Chief Complaint Left LE Ulcers Electronic Signature(s) Signed: 05/18/2020 6:26:36 PM By: Lenda Kelp PA-C Entered By: Lenda Kelp on 05/18/2020 08:23:03 -------------------------------------------------------------------------------- HPI Details Patient Name: Date of Service: Cheyenne Aran B. 05/18/2020 8:00 A M Medical Record Number: [id number] Patient Account Number: [id number] Date of Birth/Sex: Treating RN: 06-12-1961 (59 y.o. [name] Standard Primary Care Provider: Docia Chuck, [place] Other Clinician: Referring Provider: Treating Provider/Extender: Richardo Priest, [place] Weeks in Treatment: 87 History of Present Illness HPI Description: 09/30/2019 upon evaluation today patient presents for initial inspection here in our clinic concerning issues that she has been experiencing with a wound on her left anterior lower leg. She unfortunately had a significant laceration which required 19 sutures and she shows me the pictures both before as well as after sutured in a very good job was done in this regard. Unfortunately the skin did not take and necrosis. Subsequently this wound open and she has been having more significant issues with that since. Fortunately there is no evidence of active infection at this time which is good news. No fever chills noted. She has a history of chronic venous stasis but is no longer able to wear any compression in fact she cannot even wear regular socks due to  her neuropathy and the pain that she experiences in her feet if she does. For that reason she tries to elevate her legs as much as she can at nighttime but again she really cannot wear any type of compression. With regard to antibiotic it does not appear that she has been on anything recently. 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 Fortunately there is no evidence of local or systemic infection. No fevers, chills, nausea, vomiting, or diarrhea. 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 10/07/2019 on evaluation today patient presents for follow-up concerning her wound on the left lower extremity. She has been tolerating the dressing changes without complication. With that being said she just got the Santyl 2 days ago. Obviously it has not had enough time to really do what we need to do she would like to obviously give this some time before proceeding with more aggressive debridement or otherwise. 10/21/2019 upon evaluation today patient appears to be doing better with regard to the overall appearance of her wound is not quite as dry I do believe the Santyl has helped her improve to some degree here. Fortunately there is no signs of active infection at this time. I think we may be able to clean off the wound with a little bit of additional debridement today and likely we can transition away from the Santyl to some kind of collagen type dressing. 10/28/2019 upon evaluation today patient actually appears to be doing fairly well with regard to her wound today. She has been tolerating the dressing changes  we did switch to collagen and fortunately that seems to be doing well for her at this point. There does not seem to be any signs of active infection I do feel like the  surface of the wound is looking better at this time as well. 11/04/2019 upon evaluation today patient appears to be doing well with regard to her wound. She is making some progress here and the wound does appear to be measuring smaller which is good news. Overall this is good to be somewhat slow but nonetheless I am encouraged by the fact that the collagen does seem to have helped her. 11/11/2019 on evaluation today patient's wound is showing some signs of improvement. The good news is her overall trend does seem to be towards getting better compared to where she was previous. The smaller wounds along the side actually might even be healed but we can watch this 1 more week before I heal this out. Overall I feel like there is improvement here. She is still using the silver collagen. 11/18/2019 upon evaluation today patient appears to be doing about the same with regard to her wound. This is not significantly smaller there still appears to be a lot of inflammation. For that reason I do want to see about actually applying triamcinolone to the actual wound bed to see if this can be of benefit as well. I thought about this for couple weeks but I think it may be a good thing to proceed with at this point. 11/25/2019 on evaluation today patient appears to be doing well with regard to her lower extremity ulcer. She has been tolerating the dressing changes without complication. Fortunately there is no signs of active infection at this time. No fevers, chills, nausea, vomiting, or diarrhea. 12/02/2019 on evaluation today patient appears to be doing okay with regard to her ulcer on the leg. Fortunately there is no signs of active infection at this time. No fever chills noted she has been tolerating the dressing changes without complication. With that being said overall I am pleased with the fact that there is no signs of infection but at the same time I really feel like she needs more to progress moving week to week  and right now were really not seeing that. We have put in for approval for Apligraf which I think could help to move things along. With that being said we have not heard anything regarding approval at this point. 12/10/2019; patient comes in on my day although have not seen her previously. She has what was a traumatic wound on the left anterior mid tibia. Initially sutured however the area never really healed according to the patient. She has been using Santyl now using Prisma. There is exposed tendon. We have made application for Apligraf apparently this is going through [employer] still has not been approved. She arrives in clinic with erythema around the wound and 4 small circular papules superior to the wound. These are nontender and she is not complaining of any more pain. The exact cause of this is not really clear. The patient has idiopathic peripheral neuropathy which for which she has been extensively evaluated in the past she is not a diabetic she does not have any rheumatologic problems she is aware of. She cannot wear compression because it aggravates her neuropathy 4/15; traumatic wound on the left anterior mid tibia. She arrives today with extensive erythema spreading from the wound medially towards her heel. This is tender swollen and warm compatible with cellulitis.  We are using silver collagen to the wound 4/20; 5-day follow-up. Traumatic wound on the left anterior mid tibia. This is in follow-up for the cellulitis she had on her visit 5 days ago. I gave her empiric cephalexin I did not culture this. She arrives today with the erythema that we marked much improved. She has not been systemically unwell Upon evaluation today patient's wound unfortunately is not doing very well this in fact is somewhat deeper than even last week4/28/2021 which is unfortunate. I am very concerned about the fact that to be honest that the patient does not seem to be doing as good as we would have  expected in the past 2 weeks since I last saw her. Fortunately there is no evidence of active infection systemically which is good news. No fevers, chills, nausea, vomiting, or diarrhea. She has been on Keflex. 01/06/2020 upon evaluation today patient unfortunately appears to still be doing somewhat poorly overall in regard to her ulcer. There is definite signs of infection. She has been on Keflex in April but unfortunately this just does not seem to be doing as well as we would like. I do believe that we will get a need to place her on Augmentin in order to try to get this better. She tested positive for Proteus and E. coli on the culture and is allergic to doxycycline. The patient's wound currently is not looking healthy at all to me. 01/27/2020 upon evaluation today patient appears to be doing maybe slightly better with regard to the wound bed at this point. Her infection is definitely improved which is great news. As far as the Apligraf I do not believe the wound surface is quite clean enough yet in order for [place] to apply the Apligraf. Another potential option would be for a snap VAC to be used under the Curlex and Coban wrap working to try the wrap today. If we can get rid of some of the fluid I think this will help her with healing also think the snap VAC could be beneficial in this regard. Nonetheless again I am not given up on the Apligraf is a possibility I just do not want a waste the applications by putting it on before the wound surface is ready. Potentially the snap VAC can get this wound to the point where it is ready much more quickly. 02/03/2020 upon evaluation today patient appears to be doing excellent with regard to her leg ulcer compared to where things have been. The overall quality of the wound bed is significantly improved and very pleased in this regard. We did get approval for the snap VAC which I am wanting to go ahead and see about initiating at this point. We also have approval  for the Apligraf when and if the time is appropriate for this. With that being said I really think using the snap VAC to try to get this to fill-in would be appropriate to begin with. The patient is in agreement with that plan. I do believe the compression did help her and she states that that was uncomfortable she was able to manage with that for now. 02/10/2020 upon evaluation today patient appears to be doing better with regard to her original and largest wound. 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  I explained that pyoderma is incompletely understood and obviously very difficult sometimes to treat but often steroid therapy is the mainstay of treatment. I think topical steroid treatment is appropriate at this point. She does have mometasone which has been prescribed previously by her primary care provider and she has that with her so we can use that on some of the satellite areas not on the main wound. The patient is in agreement with that plan. 02/17/2020 upon evaluation today patient appears to be doing well with regard to her wound. She is making good progress with the snap VAC actually believe that she is in a good position for [place] to be able to continue the snap VAC but actually at the Apligraf to try to speed up the granulation and epithelial growth. The patient is actually in agreement with that she wants to try to get this healed as quickly as possible I completely agree with her. She did fill up the canister again today. 03/02/2020 upon evaluation today patient appears to be doing well at this time with regard to her wound. In fact this is dramatically improved with the use of the snap VAC and the Apligraf even since the last time I saw her 2 weeks ago. Again week she had a nurse visit and changed  out the snap VAC last week. Overall I think that she is definitely in a much better place and this wound is definitely making progress measuring smaller and overall I think progressing quite well. 7/13; wound surface looks very healthy. She has a small superior satellite lesion as well. Apligraf applied with a snap VAC. 03/30/2020 on evaluation today patient presents for follow-up concerning her lower extremity ulcers. Fortunately there is no signs of active infection at this time which is great news she has been tolerating the dressing changes without complication. Fortunately there is no evidence of anything really worsening the smaller wound more proximal does have some slough noted that is can require sharp debridement today however the main wound for which we have been applying Apligraf and the wound VAC seems to be doing quite well. I am extremely pleased with where things stand today. 04/13/2020 upon evaluation today patient appears to be doing much better in regard to the larger of her 2 wounds. I think we do not even really need the snap VAC any longer at this point. With regard to the smaller wound I think that this is good to be something that will continue to be beneficial as far as using the snap VAC on is concerned. We are going to reapply the Apligraf today. 04/27/2020 on evaluation today patient appears to be doing excellent in regard to both of her wounds. The snap VAC help the smaller area to feeling quite well and overall I think were very close to getting both to close. I believe collagen may be a good option at this point. 05/04/2020 upon evaluation today patient appears to be doing very well in regard to her wounds. Everything is measuring smaller and seems to be doing quite well. Overall very pleased with where things stand and the patient is extremely happy. Both wounds are getting closer and closer to complete closure and the smaller of the 2 is almost completely healed. Overall  very excited for her. 05/11/2020 on evaluation today patient appears to be doing well with regard to her wound. She has been tolerating the dressing changes without complication the Prisma has done excellent for her. The smaller of the 2 does  appear to be completely healed the larger is significantly improved and overall I am very pleased with where things stand today. There is no evidence of active infection at this time. 05/18/2020 on evaluation today patient appears to be doing excellent in regard to her leg ulcer. In fact this is measuring about half the size it was last week. Overall I am extremely pleased. I do believe the collagen is doing a great job. Electronic Signature(s) Signed: 05/18/2020 10:37:13 AM By: Lenda Kelp PA-C Signed: 05/18/2020 10:37:13 AM By: Lenda Kelp PA-C Entered By: Lenda Kelp on 05/18/2020 10:37:13 -------------------------------------------------------------------------------- Physical Exam Details Patient Name: Date of Service: [name] 05/18/2020 8:00 A M Medical Record Number: [id number] Patient Account Number: [id number] Date of Birth/Sex: Treating RN: [birth year] (59 y.o. [name] Standard Primary Care Provider: Docia Chuck, [place] Other Clinician: Referring Provider: Treating Provider/Extender: Richardo Priest, [place] Weeks in Treatment: 60 Constitutional Well-nourished and well-hydrated in no acute distress. Respiratory normal breathing without difficulty. Psychiatric this patient is able to make decisions and demonstrates good insight into disease process. Alert and Oriented x 3. pleasant and cooperative. Notes Patient's wound bed actually showed signs of good granulation there was no significant slough buildup which is great news and overall I feel like she is managing quite nicely at this point. Electronic Signature(s) Signed: 05/18/2020 10:37:29 AM By: Lenda Kelp PA-C Entered By: Lenda Kelp on 05/18/2020  10:37:29 -------------------------------------------------------------------------------- Physician Orders Details Patient Name: Date of Service: Cheyenne Aran B. 05/18/2020 8:00 A M Medical Record Number: [id number] Patient Account Number: [id number] Date of Birth/Sex: Treating RN: 28-Jan-1961 (58 y.o. 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 Dressing Change Frequency Wound #2 Left,Anterior Lower Leg Other: - 2 times per week Skin Barriers/Peri-Wound Care Moisturizing lotion TCA Cream or Ointment - or menometasone cream to irritated periwound skin Wound Cleansing Wound #2 Left,Anterior Lower Leg May shower with protection. Primary Wound Dressing Wound #2 Left,Anterior Lower Leg Silver Collagen - moisten with hydrogel or [place] gel Secondary Dressing Wound #2 Left,Anterior Lower Leg Telfa (non adherent pad) Other: - vaseline gauze Edema Control Kerlix and Coban - Left Lower Extremity Avoid standing for long periods of time Elevate legs to the level of the heart or above for 30 minutes daily and/or when sitting, a frequency of: - throughout the day Exercise regularly Electronic Signature(s) Signed: 05/18/2020 6:07:28 PM By: Zenaida Deed RN, BSN Signed: 05/18/2020 6:26:36 PM By: Lenda Kelp PA-C Entered By: Zenaida Deed on 05/18/2020 08:54:22 -------------------------------------------------------------------------------- Problem List Details Patient Name: Date of Service: Cheyenne Aran B. 05/18/2020 8:00 A M Medical Record Number: [id number] Patient Account Number: [id number] Date of Birth/Sex: Treating RN: July 03, 1961 (59 y.o. [name] Standard Primary Care Provider: Docia Chuck, [place] Other Clinician: Referring Provider: Treating Provider/Extender: Richardo Priest, [place] Weeks in Treatment: 84 Active Problems ICD-10 Encounter Code Description Active Date MDM Diagnosis I87.2 Venous insufficiency (  chronic) (peripheral) 09/30/2019 No Yes L97.822 Non-pressure chronic ulcer of other part of left lower leg with fat layer exposed 09/30/2019 No Yes L03.116 Cellulitis of left lower limb 12/17/2019 No Yes L97.828 Non-pressure chronic ulcer of other part of left lower leg with other specified 09/30/2019 No Yes severity L97.322 Non-pressure chronic ulcer of left ankle with fat layer exposed 02/03/2020 No Yes 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 05/18/2020 8:00 A M Medical Record Number: [id number] Patient Account Number: [id number] Date of Birth/Sex: Treating RN: 1961-07-17 (59 y.o. [name] Standard Primary Care Provider: Docia Chuck, [place] Other Clinician: Referring Provider: Treating Provider/Extender: Richardo Priest,  [place] Weeks in Treatment: 38 Subjective Chief Complaint Information obtained from Patient Left LE Ulcers History of Present Illness (HPI) 09/30/2019 upon evaluation today patient presents for initial inspection here in our clinic concerning issues that she has been experiencing with a wound on her left anterior lower leg. She unfortunately had a significant laceration which required 19 sutures and she shows me the pictures both before as well as after sutured in a very good job was done in this regard. Unfortunately the skin did not take and necrosis. Subsequently this wound open and she has been having more significant issues with that since. Fortunately there is no evidence of active infection at this time which is good news. No fever chills noted. She has a history of chronic venous stasis but is no longer able to wear any compression in fact she cannot even wear regular socks due to her neuropathy and the pain that she experiences in her feet if she does. For that reason she tries to elevate her legs as much as she can at nighttime but again she really cannot wear any type of compression. With regard to antibiotic it does not appear that she has been on anything recently. 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 Fortunately there is no evidence of local or systemic infection. No fevers, chills, nausea, vomiting, or diarrhea. 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 10/07/2019 on evaluation today patient presents for follow-up concerning her wound on the left lower extremity. She has been tolerating the dressing changes without complication. With that being said she just got the Santyl 2 days ago. Obviously it has not had enough time to really do what we  need to do she would like to obviously give this some time before proceeding with more aggressive debridement or otherwise. 10/21/2019 upon evaluation today patient appears to be doing better with regard to the overall appearance of her wound is not quite as dry I do believe the Santyl has helped her improve to some degree here. Fortunately there is no signs of active infection at this time. I think we may be able to clean off the wound with a little bit of additional debridement today and likely we can transition away from the [facility]  to some kind of collagen type dressing. 10/28/2019 upon evaluation today patient actually appears to be doing fairly well with regard to her wound today. She has been tolerating the dressing changes we did switch to collagen and fortunately that seems to be doing well for her at this point. There does not seem to be any signs of active infection I do feel like the surface of the wound is looking better at this time as well. 11/04/2019 upon evaluation today patient appears to be doing well with regard to her wound. She is making some progress here and the wound does appear to be measuring smaller which is good news. Overall this is good to be somewhat slow but nonetheless I am encouraged by the fact that the collagen does seem to have helped her. 11/11/2019 on evaluation today patient's wound is showing some signs of improvement. The good news is her overall trend does seem to be towards getting better compared to where she was previous. The smaller wounds along the side actually might even be healed but we can watch this 1 more week before I heal this out. Overall I feel like there is improvement here. She is still using the silver collagen. 11/18/2019 upon evaluation today patient appears to be doing about the same with regard to her wound. This is not significantly smaller there still appears to be a lot of inflammation. For that reason I do want to see about actually  applying triamcinolone to the actual wound bed to see if this can be of benefit as well. I thought about this for couple weeks but I think it may be a good thing to proceed with at this point. 11/25/2019 on evaluation today patient appears to be doing well with regard to her lower extremity ulcer. She has been tolerating the dressing changes without complication. Fortunately there is no signs of active infection at this time. No fevers, chills, nausea, vomiting, or diarrhea. 12/02/2019 on evaluation today patient appears to be doing okay with regard to her ulcer on the leg. Fortunately there is no signs of active infection at this time. No fever chills noted she has been tolerating the dressing changes without complication. With that being said overall I am pleased with the fact that there is no signs of infection but at the same time I really feel like she needs more to progress moving week to week and right now were really not seeing that. We have put in for approval for Apligraf which I think could help to move things along. With that being said we have not heard anything regarding approval at this point. 12/10/2019; patient comes in on my day although have not seen her previously. She has what was a traumatic wound on the left anterior mid tibia. Initially sutured however the area never really healed according to the patient. She has been using Santyl now using Prisma. There is exposed tendon. We have made application for Apligraf apparently this is going through [employer] still has not been approved. She arrives in clinic with erythema around the wound and 4 small circular papules superior to the wound. These are nontender and she is not complaining of any more pain. The exact cause of this is not really clear. The patient has idiopathic peripheral neuropathy which for which she has been extensively evaluated in the past she is not a diabetic she does not have any rheumatologic problems  she is aware of. She cannot wear compression because it aggravates her neuropathy  4/15; traumatic wound on the left anterior mid tibia. She arrives today with extensive erythema spreading from the wound medially towards her heel. This is tender swollen and warm compatible with cellulitis. We are using silver collagen to the wound 4/20; 5-day follow-up. Traumatic wound on the left anterior mid tibia. This is in follow-up for the cellulitis she had on her visit 5 days ago. I gave her empiric cephalexin I did not culture this. She arrives today with the erythema that we marked much improved. She has not been systemically unwell Upon evaluation today patient's wound unfortunately is not doing very well this in fact is somewhat deeper than even last week4/28/2021 which is unfortunate. I am very concerned about the fact that to be honest that the patient does not seem to be doing as good as we would have expected in the past 2 weeks since I last saw her. Fortunately there is no evidence of active infection systemically which is good news. No fevers, chills, nausea, vomiting, or diarrhea. She has been on Keflex. 01/06/2020 upon evaluation today patient unfortunately appears to still be doing somewhat poorly overall in regard to her ulcer. There is definite signs of infection. She has been on Keflex in April but unfortunately this just does not seem to be doing as well as we would like. I do believe that we will get a need to place her on Augmentin in order to try to get this better. She tested positive for Proteus and E. coli on the culture and is allergic to doxycycline. The patient's wound currently is not looking healthy at all to me. 01/27/2020 upon evaluation today patient appears to be doing maybe slightly better with regard to the wound bed at this point. Her infection is definitely improved which is great news. As far as the Apligraf I do not believe the wound surface is quite clean enough yet in order  for us to apply the Apligraf. Another potential option would be for a snap VAC to be used under the Curlex and Coban wrap working to try the wrap today. If we can get rid of some of the fluid I think this will help her with healing also think the snap VAC could be beneficial in this regard. Nonetheless again I am not given up on the Apligraf is a possibility I just do not want a waste the applications by putting it on before the wound surface is ready. Potentially the snap VAC can get this wound to the point where it is ready much more quickly. 02/03/2020 upon evaluation today patient appears to be doing excellent with regard to her leg ulcer compared to where things have been. The overall quality of the wound bed is significantly improved and very pleased in this regard. We did get approval for the snap VAC which I am wanting to go ahead and see about initiating at this point. We also have approval for the Apligraf when and if the time is appropriate for this. With that being said I really think using the snap VAC to try to get this to fill-in would be appropriate to begin with. The patient is in agreement with that plan. I do believe the compression did help her and she states that that was uncomfortable she was able to manage with that for now. 02/10/2020 upon evaluation today patient appears to be doing better with regard to her original and largest wound. 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 I explained that pyoderma is incompletely understood and obviously very difficult sometimes to treat but often steroid therapy is the mainstay of treatment. I think topical steroid treatment is appropriate at this point. She does have mometasone which has been prescribed previously by her primary care provider and she  has that with her so we can use that on some of the satellite areas not on the main wound. The patient is in agreement with that plan. 02/17/2020 upon evaluation today patient appears to be doing well with regard to her wound. She is making good progress with the snap VAC actually believe that she is in a good position for [place] to be able to continue the snap VAC but actually at the Apligraf to try to speed up the granulation and epithelial growth. The patient is actually in agreement with that she wants to try to get this healed as quickly as possible I completely agree with her. She did fill up the canister again today. 03/02/2020 upon evaluation today patient appears to be doing well at this time with regard to her wound. In fact this is dramatically improved with the use of the snap VAC and the Apligraf even since the last time I saw her 2 weeks ago. Again week she had a nurse visit and changed out the snap VAC last week. Overall I think that she is definitely in a much better place and this wound is definitely making progress measuring smaller and overall I think progressing quite well. 7/13; wound surface looks very healthy. She has a small superior satellite lesion as well. Apligraf applied with a snap VAC. 03/30/2020 on evaluation today patient presents for follow-up concerning her lower extremity ulcers. Fortunately there is no signs of active infection at this time which is great news she has been tolerating the dressing changes without complication. Fortunately there is no evidence of anything really worsening the smaller wound more proximal does have some slough noted that is can require sharp debridement today however the main wound for which we have been applying Apligraf and the wound VAC seems to be doing quite well. I am extremely pleased with where things stand today. 04/13/2020 upon evaluation today patient appears to be doing much better in regard to the larger of her 2 wounds. I think we  do not even really need the snap VAC any longer at this point. With regard to the smaller wound I think that this is good to be something that will continue to be beneficial as far as using the snap VAC on is concerned. We are going to reapply the Apligraf today. 04/27/2020 on evaluation today patient appears to be doing excellent in regard to both of her wounds. The snap VAC help the smaller area to feeling quite well and overall I think were very close to getting both to close. I believe collagen may be a good option at this point. 05/04/2020 upon evaluation today patient appears to be doing very well in regard to her wounds. Everything is measuring smaller and seems to be doing quite well. Overall very pleased with where things stand and the patient is extremely happy. Both wounds are getting closer and closer to complete closure and the smaller of the 2 is almost completely healed. Overall very excited for her. 05/11/2020 on evaluation today patient appears  to be doing well with regard to her wound. She has been tolerating the dressing changes without complication the Prisma has done excellent for her. The smaller of the 2 does appear to be completely healed the larger is significantly improved and overall I am very pleased with where things stand today. There is no evidence of active infection at this time. 05/18/2020 on evaluation today patient appears to be doing excellent in regard to her leg ulcer. In fact this is measuring about half the size it was last week. Overall I am extremely pleased. I do believe the collagen is doing a great job. Objective Constitutional Well-nourished and well-hydrated in no acute distress. Vitals Time Taken: 8:11 AM, Height: 66 in, Weight: 156 lbs, BMI: 25.2, Temperature: 98.4 F, Pulse: 91 bpm, Respiratory Rate: 16 breaths/min, Blood Pressure: 106/74 mmHg. Respiratory normal breathing without difficulty. Psychiatric this patient is able to make decisions and  demonstrates good insight into disease process. Alert and Oriented x 3. pleasant and cooperative. General Notes: Patient's wound bed actually showed signs of good granulation there was no significant slough buildup which is great news and overall I feel like she is managing quite nicely at this point. Integumentary (Hair, Skin) Wound #2 status is Open. Original cause of wound was Trauma. The wound is located on the Left,Anterior Lower Leg. The wound measures 1.5cm length x 0.5cm width x 0.1cm depth; 0.589cm^2 area and 0.059cm^3 volume. There is Fat Layer (Subcutaneous Tissue) exposed. There is no tunneling or undermining noted. There is a medium amount of serosanguineous drainage noted. The wound margin is distinct with the outline attached to the wound base. There is large (67-100%) pink granulation within the wound bed. There is no necrotic tissue within the wound bed. 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 Dressing Change Frequency: Wound #2 Left,Anterior Lower Leg: Other: - 2 times per week Skin Barriers/Peri-Wound Care: Moisturizing lotion TCA Cream or Ointment - or menometasone cream to irritated periwound skin Wound Cleansing: Wound #2 Left,Anterior Lower Leg: May shower with protection. Primary Wound Dressing: Wound #2 Left,Anterior Lower Leg: Silver Collagen - moisten with hydrogel or KY gel Secondary Dressing: Wound #2 Left,Anterior Lower Leg: T (non adherent pad) elfa Other: - vaseline  gauze Edema Control: Kerlix and Coban - Left Lower Extremity Avoid standing for long periods of time Elevate legs to the level of the heart or above for 30 minutes daily and/or when sitting, a frequency of: - throughout the day Exercise regularly 1. I would recommend currently that we go ahead and continue with the wound care measures as before specifically with regard to the silver collagen dressing which I think is doing an excellent job at this point. 2. I am also can recommend currently that the patient continue with the Curlex and Coban wrap which does seem to help she changes this some at home as well. The big thing is keeping this from drying out if she needs to I did advise she could use hydrogel although right now regular try a Vaseline gauze dressing. 3. I am also can recommend she continue to elevate her legs much as possible  right now her edema is under great control. We will see patient back for reevaluation in 1 week here in the clinic. If anything worsens or changes patient will contact our office for additional recommendations. Electronic Signature(s) Signed: 05/18/2020 10:38:15 AM By: Lenda Kelp PA-C Entered By: Lenda Kelp on 05/18/2020 10:38:14 -------------------------------------------------------------------------------- SuperBill Details Patient Name: Date of Service: [name] 05/18/2020 Medical Record Number: [id number] Patient Account Number: [id number] Date of Birth/Sex: Treating RN: 1960-10-17 (59 y.o. [name] Standard Primary Care Provider: Docia Chuck, [place] Other Clinician: Referring Provider: Treating Provider/Extender: Richardo Priest, [place] Weeks in Treatment: 33 Diagnosis Coding ICD-10 Codes Code Description I87.2 Venous insufficiency (chronic) (peripheral) L97.822 Non-pressure chronic ulcer of other part of left lower leg with fat layer exposed L03.116 Cellulitis of left lower limb L97.828 Non-pressure chronic ulcer of other  part of left lower leg with other specified severity L97.322 Non-pressure chronic ulcer of left ankle with fat layer exposed 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 I73.00 Raynaud's syndrome without gangrene 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, unspecified Facility Procedures CPT4 Code: [id number] Description: 99213 - WOUND CARE VISIT-LEV 3 EST PT Modifier: Quantity: 1 Physician Procedures : CPT4 Code Description Modifier [id number] 99213 - WC PHYS LEVEL 3 - EST PT ICD-10 Diagnosis Description I87.2 Venous insufficiency (chronic) (peripheral) L97.822 Non-pressure chronic ulcer of other part of left lower leg with fat layer exposed L03.116  Cellulitis of left lower limb L97.828 Non-pressure chronic ulcer of other part of left lower leg with other specified severity Quantity: 1 Electronic Signature(s) Signed: 05/18/2020 10:38:31 AM By: Lenda Kelp PA-C Entered By: Lenda Kelp on 05/18/2020 10:38:31

## 2020-05-23 NOTE — Progress Notes (Signed)
[name], [name] ([id number]) Visit Report for 05/18/2020 Arrival Information Details Patient Name: Date of Service: [name], [name] 05/18/2020 8:00 A M Medical Record Number: [id number] Patient Account Number: [id number] Date of Birth/Sex: Treating RN: [birth year] (59 y.o. [name] Primary Care Crespin Forstrom: [name], [place] Other Clinician: Referring Abdoulaye Drum: Treating Andriana Casa/Extender: Dayna Ramus, [place] Weeks in Treatment: 72 Visit Information History Since Last Visit Added or deleted any medications: No Patient Arrived: Ambulatory Any new allergies or adverse reactions: No Arrival Time: 08:11 Had a fall or experienced change in No Accompanied By: self activities of daily living that may affect Transfer Assistance: None risk of falls: Patient Identification Verified: Yes Signs or symptoms of abuse/neglect since last visito No Secondary Verification Process Completed: Yes Hospitalized since last visit: No Patient Requires Transmission-Based Precautions: No Implantable device outside of the clinic excluding No Patient Has Alerts: Yes cellular tissue based products placed in the center since last visit: Has Dressing in Place as Prescribed: Yes Pain Present Now: No Electronic Signature(s) Signed: 05/19/2020 11:12:08 AM By: Sandre Kitty Entered By: Sandre Kitty on 05/18/2020 08:11:25 -------------------------------------------------------------------------------- Clinic Level of Care Assessment Details Patient Name: Date of Service: Cheyenne, Richardson 05/18/2020 8:00 A M Medical Record Number: [id number] Patient Account Number: [id number] Date of Birth/Sex: Treating RN: 07/18/1961 (59 y.o. [name]: [name], [place] Other Clinician: Referring Velna Hedgecock: Treating Nyheem Binette/Extender: Dayna Ramus, [place] Weeks in Treatment: 18 Clinic Level of Care Assessment Items TOOL 4 Quantity Score []  - 0 Use when only an  EandM is performed on FOLLOW-UP visit ASSESSMENTS - Nursing Assessment / Reassessment X- 1 10 Reassessment of Co-morbidities (includes updates in patient status) X- 1 5 Reassessment of Adherence to Treatment Plan ASSESSMENTS - Wound and Skin A ssessment / Reassessment X - Simple Wound Assessment / Reassessment - one wound 1 5 []  - 0 Complex Wound Assessment / Reassessment - multiple wounds []  - 0 Dermatologic / Skin Assessment (not related to wound area) ASSESSMENTS - Focused Assessment X- 1 5 Circumferential Edema Measurements - multi extremities []  - 0 Nutritional Assessment / Counseling / Intervention X- 1 5 Lower Extremity Assessment (monofilament, tuning fork, pulses) []  - 0 Peripheral Arterial Disease Assessment (using hand held doppler) ASSESSMENTS - Ostomy and/or Continence Assessment and Care []  - 0 Incontinence Assessment and Management []  - 0 Ostomy Care Assessment and Management (repouching, etc.) PROCESS - Coordination of Care X - Simple Patient / Family Education for ongoing care 1 15 []  - 0 Complex (extensive) Patient / Family Education for ongoing care X- 1 10 Staff obtains Programmer, systems, Records, T Results / Process Orders est []  - 0 Staff telephones HHA, Nursing Homes / Clarify orders / etc []  - 0 Routine Transfer to another Facility (non-emergent condition) []  - 0 Routine Hospital Admission (non-emergent condition) []  - 0 New Admissions / Biomedical engineer / Ordering NPWT Apligraf, etc. , []  - 0 Emergency Hospital Admission (emergent condition) X- 1 10 Simple Discharge Coordination []  - 0 Complex (extensive) Discharge Coordination PROCESS - Special Needs []  - 0 Pediatric / Minor Patient Management []  - 0 Isolation Patient Management []  - 0 Hearing / Language / Visual special needs []  - 0 Assessment of Community assistance (transportation, D/C planning, etc.) []  - 0 Additional assistance / Altered mentation []  - 0 Support Surface(s)  Assessment (bed, cushion, seat, etc.) INTERVENTIONS - Wound Cleansing / Measurement X - Simple Wound Cleansing - one wound 1 5 []  - 0 Complex Wound Cleansing - multiple wounds  X- 1 5 Wound Imaging (photographs - any number of wounds) []  - 0 Wound Tracing (instead of photographs) X- 1 5 Simple Wound Measurement - one wound []  - 0 Complex Wound Measurement - multiple wounds INTERVENTIONS - Wound Dressings X - Small Wound Dressing one or multiple wounds 1 10 []  - 0 Medium Wound Dressing one or multiple wounds []  - 0 Large Wound Dressing one or multiple wounds X- 1 5 Application of Medications - topical []  - 0 Application of Medications - injection INTERVENTIONS - Miscellaneous []  - 0 External ear exam []  - 0 Specimen Collection (cultures, biopsies, blood, body fluids, etc.) []  - 0 Specimen(s) / Culture(s) sent or taken to Lab for analysis []  - 0 Patient Transfer (multiple staff / Civil Service fast streamer / Similar devices) []  - 0 Simple Staple / Suture removal (25 or less) []  - 0 Complex Staple / Suture removal (26 or more) []  - 0 Hypo / Hyperglycemic Management (close monitor of Blood Glucose) []  - 0 Ankle / Brachial Index (ABI) - do not check if billed separately X- 1 5 Vital Signs Has the patient been seen at the hospital within the last three years: Yes Total Score: 100 Level Of Care: New/Established - Level 3 Electronic Signature(s) Signed: 05/18/2020 6:07:28 PM By: Baruch Gouty RN, BSN Entered By: Baruch Gouty on 05/18/2020 08:52:15 -------------------------------------------------------------------------------- Encounter Discharge Information Details Patient Name: Date of Service: Cheyenne Mccallum B. 05/18/2020 8:00 A M Medical Record Number: [id number] Patient Account Number: [id number] Date of Birth/Sex: Treating RN: 1961/02/04 (59 y.o. [name] Primary Care Zeena Starkel: [name], [place] Other Clinician: Referring Ricci Paff: Treating Lovett Coffin/Extender: Dayna Ramus, [place] Weeks in Treatment: 91 Encounter Discharge Information Items Discharge Condition: Stable Ambulatory Status: Ambulatory Discharge Destination: Home Transportation: Private Auto Accompanied By: self Schedule Follow-up Appointment: Yes Clinical Summary of Care: Patient Declined Electronic Signature(s) Signed: 05/23/2020 1:15:48 PM By: Carlene Coria RN Entered By: Carlene Coria on 05/18/2020 09:09:55 -------------------------------------------------------------------------------- Lower Extremity Assessment Details Patient Name: Date of Service: [name], [name] 05/18/2020 8:00 A M Medical Record Number: [id number] Patient Account Number: [id number] Date of Birth/Sex: Treating RN: December 06, 1960 (59 y.o. [name] Primary Care Echo Allsbrook: [place], [place] Other Clinician: Referring Shizuye Rupert: Treating Jacquette Canales/Extender: Dayna Ramus, [place] Weeks in Treatment: 33 Edema Assessment Assessed: [Left: No] [Right: No] Edema: [Left: N] [Right: o] Calf Left: Right: Point of Measurement: 29 cm From Medial Instep 31.5 cm cm Ankle Left: Right: Point of Measurement: 8 cm From Medial Instep 21 cm cm Vascular Assessment Pulses: Dorsalis Pedis Palpable: [Left:Yes] Electronic Signature(s) Signed: 05/18/2020 6:07:28 PM By: Baruch Gouty RN, BSN Signed: 05/19/2020 11:12:08 AM By: Sandre Kitty Entered By: Sandre Kitty on 05/18/2020 08:12:01 -------------------------------------------------------------------------------- Multi-Disciplinary Care Plan Details Patient Name: Date of Service: Cheyenne Mccallum B. 05/18/2020 8:00 A M Medical Record Number: [id number] Patient Account Number: [id number] Date of Birth/Sex: Treating RN: 1961/02/13 (59 y.o. [name] Primary Care Jakson Delpilar: [name], [place] Other Clinician: Referring Everlena Mackley: Treating Charleene Callegari/Extender: Dayna Ramus, [place] Weeks in Treatment: 18 Active Inactive Venous Leg  Ulcer Nursing Diagnoses: Knowledge deficit related to disease process and management Potential for venous Insuffiency (use before diagnosis confirmed) Goals: Patient will maintain optimal edema control Date Initiated: 09/30/2019 Target Resolution Date: 05/25/2020 Goal Status: Active Patient/caregiver will verbalize understanding of disease process and disease management Date Initiated: 09/30/2019 Date Inactivated: 11/25/2019 Target Resolution Date: 11/25/2019 Goal Status: Met Interventions: Assess peripheral edema status every visit. Compression as ordered Provide education on venous insufficiency Treatment Activities: Therapeutic compression  applied : 09/30/2019 Notes: Wound/Skin Impairment Nursing Diagnoses: Impaired tissue integrity Knowledge deficit related to ulceration/compromised skin integrity Goals: Patient/caregiver will verbalize understanding of skin care regimen Date Initiated: 09/30/2019 Target Resolution Date: 05/25/2020 Goal Status: Active Ulcer/skin breakdown will have a volume reduction of 30% by week 4 Date Initiated: 09/30/2019 Date Inactivated: 10/28/2019 Target Resolution Date: 10/28/2019 Goal Status: Unmet Unmet Reason: other comorbidities Interventions: Assess patient/caregiver ability to obtain necessary supplies Assess patient/caregiver ability to perform ulcer/skin care regimen upon admission and as needed Assess ulceration(s) every visit Provide education on ulcer and skin care Treatment Activities: Skin care regimen initiated : 09/30/2019 Topical wound management initiated : 09/30/2019 Notes: Electronic Signature(s) Signed: 05/18/2020 6:07:28 PM By: Baruch Gouty RN, BSN Entered By: Baruch Gouty on 05/18/2020 08:14:26 -------------------------------------------------------------------------------- Pain Assessment Details Patient Name: Date of Service: Cheyenne Mccallum B. 05/18/2020 8:00 A M Medical Record Number: [id number] Patient Account Number:  [id number] Date of Birth/Sex: Treating RN: December 30, 1960 (60 y.o. [name] Primary Care Dacoda Finlay: [name], [place] Other Clinician: Referring Shiva Sahagian: Treating Macarius Ruark/Extender: Dayna Ramus, [place] Weeks in Treatment: 32 Active Problems Location of Pain Severity and Description of Pain Patient Has Paino No Site Locations Pain Management and Medication Current Pain Management: Electronic Signature(s) Signed: 05/18/2020 6:07:28 PM By: Baruch Gouty RN, BSN Signed: 05/19/2020 11:12:08 AM By: Sandre Kitty Entered By: Sandre Kitty on 05/18/2020 08:11:51 -------------------------------------------------------------------------------- Patient/Caregiver Education Details Patient Name: Date of Service: [name], Cheyenne B. 9/15/2021andnbsp8:00 [place] Record Number: [id number] Patient Account Number: [id number] Date of Birth/Gender: Treating RN: 02-Feb-1961 (59 y.o. [name] Primary Care Physician: [name], [place] Other Clinician: Referring Physician: Treating Physician/Extender: Dayna Ramus, [place] Weeks in Treatment: 55 Education Assessment Education Provided To: Patient Education Topics Provided Venous: Methods: Explain/Verbal Responses: Reinforcements needed, State content correctly Wound/Skin Impairment: Methods: Explain/Verbal Responses: Reinforcements needed, State content correctly Electronic Signature(s) Signed: 05/18/2020 6:07:28 PM By: Baruch Gouty RN, BSN Entered By: Baruch Gouty on 05/18/2020 08:15:01 -------------------------------------------------------------------------------- Wound Assessment Details Patient Name: Date of Service: Cheyenne Mccallum B. 05/18/2020 8:00 A M Medical Record Number: [id number] Patient Account Number: [id number] Date of Birth/Sex: Treating RN: [birth year] (59 y.o. [name] Primary Care Khylen Riolo: [name], [place] Other Clinician: Referring Bernadett Milian: Treating  Alicha Raspberry/Extender: Dayna Ramus, [place] Weeks in Treatment: 23 Wound Status Wound Number: 2 Primary Etiology: Venous Leg Ulcer Wound Location: Left, Anterior Lower Leg Wound Status: Open Wounding Event: Trauma Comorbid History: Lymphedema, Raynauds, Neuropathy Date Acquired: 08/06/2019 Weeks Of Treatment: 33 Clustered Wound: No Photos Photo Uploaded By: Mikeal Hawthorne on 05/19/2020 11:26:22 Wound Measurements Length: (cm) 1.5 Width: (cm) 0.5 Depth: (cm) 0.1 Area: (cm) 0.589 Volume: (cm) 0.059 % Reduction in Area: 96.8% % Reduction in Volume: 98.4% Epithelialization: Large (67-100%) Tunneling: No Undermining: No Wound Description Classification: Full Thickness With Exposed Support Structures Wound Margin: Distinct, outline attached Exudate Amount: Medium Exudate Type: Serosanguineous Exudate Color: red, brown Wound Bed Granulation Amount: Large (67-100%) Granulation Quality: Pink Necrotic Amount: None Present (0%) Foul Odor After Cleansing: No Slough/Fibrino No Exposed Structure Fascia Exposed: No Fat Layer (Subcutaneous Tissue) Exposed: Yes Tendon Exposed: No Muscle Exposed: No Joint Exposed: No Bone Exposed: No Treatment Notes Wound #2 (Left, Anterior Lower Leg) 1. Cleanse With Wound Cleanser 2. Periwound Care TCA Cream 3. Primary Dressing Applied Collegen AG Hydrogel or K-Y Jelly 4. Secondary Dressing Dry Gauze Foam Other secondary dressing (specify in notes) 6. Support Layer Applied Kerlix/Coban Notes vasoline Manufacturing engineer) Signed: 05/18/2020 6:07:28 PM By: Baruch Gouty RN, BSN Signed: 05/19/2020  11:12:08 AM By: Sandre Kitty Entered By: Sandre Kitty on 05/18/2020 08:12:11 -------------------------------------------------------------------------------- Vitals Details Patient Name: Date of Service: Cheyenne Mccallum B. 05/18/2020 8:00 A M Medical Record Number: [id number] Patient Account Number: [id number] Date of  Birth/Sex: Treating RN: August 26, 1961 (59 y.o. [name] Primary Care Devaun Hernandez: [name], [place] Other Clinician: Referring Scarlet Abad: Treating Delsy Etzkorn/Extender: Dayna Ramus, [place] Weeks in Treatment: 50 Vital Signs Time Taken: 08:11 Temperature (F): 98.4 Height (in): 66 Pulse (bpm): 91 Weight (lbs): 156 Respiratory Rate (breaths/min): 16 Body Mass Index (BMI): 25.2 Blood Pressure (mmHg): 106/74 Reference Range: 80 - 120 mg / dl Electronic Signature(s) Signed: 05/19/2020 11:12:08 AM By: Sandre Kitty Entered By: Sandre Kitty on 05/18/2020 08:11:46

## 2020-05-25 ENCOUNTER — Encounter (HOSPITAL_BASED_OUTPATIENT_CLINIC_OR_DEPARTMENT_OTHER): Payer: Worker's Compensation | Admitting: Physician Assistant

## 2020-05-25 DIAGNOSIS — L97828 Non-pressure chronic ulcer of other part of left lower leg with other specified severity: Secondary | ICD-10-CM | POA: Diagnosis not present

## 2020-05-25 NOTE — Progress Notes (Signed)
[name], [name] ([id number]) Visit Report for 05/25/2020 Arrival Information Details Patient Name: Date of Service: [name], [name] 05/25/2020 8:00 A M Medical Record Number: [id number] Patient Account Number: [id number] Date of Birth/Sex: Treating RN: Apr 16, 1961 (59 y.o. [name] Primary Care Aariyah Sampey: [name], [place] Other Clinician: Referring Dnyla Antonetti: Treating Dekayla Prestridge/Extender: Dayna Ramus, [place] Weeks in Treatment: 57 Visit Information History Since Last Visit Added or deleted any medications: No Patient Arrived: Ambulatory Any new allergies or adverse reactions: No Arrival Time: 08:12 Had a fall or experienced change in No Accompanied By: alone activities of daily living that may affect Transfer Assistance: None risk of falls: Patient Identification Verified: Yes Signs or symptoms of abuse/neglect since last visito No Secondary Verification Process Completed: Yes Hospitalized since last visit: No Patient Requires Transmission-Based Precautions: No Implantable device outside of the clinic excluding No Patient Has Alerts: Yes cellular tissue based products placed in the center since last visit: Has Dressing in Place as Prescribed: No Has Compression in Place as Prescribed: No Pain Present Now: No Electronic Signature(s) Signed: 05/25/2020 4:54:19 PM By: Levan Hurst RN, BSN Entered By: Levan Hurst on 05/25/2020 08:13:16 -------------------------------------------------------------------------------- Clinic Level of Care Assessment Details Patient Name: Date of Service: [name], [name] 05/25/2020 8:00 A M Medical Record Number: [id number] Patient Account Number: [id number] Date of Birth/Sex: Treating RN: [birth year] (59 y.o. [name] Primary Care Abundio Teuscher: [name], [place] Other Clinician: Referring Nizhoni Parlow: Treating Lori-Ann Lindfors/Extender: Dayna Ramus, [place] Weeks in Treatment: 33 Clinic Level of Care Assessment Items TOOL 4  Quantity Score _0  - 0 Use when only an EandM is performed on FOLLOW-UP visit ASSESSMENTS - Nursing Assessment / Reassessment X- 1 10 Reassessment of Co-morbidities (includes updates in patient status) X- 1 5 Reassessment of Adherence to Treatment Plan ASSESSMENTS - Wound and Skin A ssessment / Reassessment X - Simple Wound Assessment / Reassessment - one wound 1 5 _1  - 0 Complex Wound Assessment / Reassessment - multiple wounds _2  - 0 Dermatologic / Skin Assessment (not related to wound area) ASSESSMENTS - Focused Assessment X- 1 5 Circumferential Edema Measurements - multi extremities _3  - 0 Nutritional Assessment / Counseling / Intervention X- 1 5 Lower Extremity Assessment (monofilament, tuning fork, pulses) _4  - 0 Peripheral Arterial Disease Assessment (using hand held doppler) ASSESSMENTS - Ostomy and/or Continence Assessment and Care _5  - 0 Incontinence Assessment and Management _6  - 0 Ostomy Care Assessment and Management (repouching, etc.) PROCESS - Coordination of Care X - Simple Patient / Family Education for ongoing care 1 15 _7  - 0 Complex (extensive) Patient / Family Education for ongoing care X- 1 10 Staff obtains Programmer, systems, Records, T Results / Process Orders est _8  - 0 Staff telephones HHA, Nursing Homes / Clarify orders / etc _9  - 0 Routine Transfer to another Facility (non-emergent condition) _10  - 0 Routine Hospital Admission (non-emergent condition) _11  - 0 New Admissions / Biomedical engineer / Ordering NPWT Apligraf, etc. , _12  - 0 Emergency Hospital Admission (emergent condition) X- 1 10 Simple Discharge Coordination _13  - 0 Complex (extensive) Discharge Coordination PROCESS - Special Needs _14  - 0 Pediatric / Minor Patient Management _15  - 0 Isolation Patient Management _16  - 0 Hearing / Language / Visual special needs _17  - 0 Assessment of Community assistance (transportation, D/C planning, etc.) _18  - 0 Additional assistance / Altered  mentation _19  - 0 Support Surface(s) Assessment (bed, cushion, seat, etc.) INTERVENTIONS - Wound Cleansing / Measurement X - Simple Wound Cleansing - one wound 1 5 _20  -  0 Complex Wound Cleansing - multiple wounds X- 1 5 Wound Imaging (photographs - any number of wounds) _0  - 0 Wound Tracing (instead of photographs) X- 1 5 Simple Wound Measurement - one wound _1  - 0 Complex Wound Measurement - multiple wounds INTERVENTIONS - Wound Dressings _2  - 0 Small Wound Dressing one or multiple wounds X- 1 15 Medium Wound Dressing one or multiple wounds _3  - 0 Large Wound Dressing one or multiple wounds X- 1 5 Application of Medications - topical <[id number]>_8<\/[id number]>_0  - 0 Application of Medications - injection INTERVENTIONS - Miscellaneous _5  - 0 External ear exam _6  - 0 Specimen Collection (cultures, biopsies, blood, body fluids, etc.) _7  - 0 Specimen(s) / Culture(s) sent or taken to Lab for analysis _8  - 0 Patient Transfer (multiple staff / Civil Service fast streamer / Similar devices) _9  - 0 Simple Staple / Suture removal (25 or less) _10  - 0 Complex Staple / Suture removal (26 or more) _11  - 0 Hypo / Hyperglycemic Management (close monitor of Blood Glucose) _12  - 0 Ankle / Brachial Index (ABI) - do not check if billed separately X- 1 5 Vital Signs Has the patient been seen at the hospital within the last three years: Yes Total Score: 105 Level Of Care: New/Established - Level 3 Electronic Signature(s) Signed: 05/25/2020 5:11:27 PM By: Baruch Gouty RN, BSN Entered By: Baruch Gouty on 05/25/2020 08:33:50 -------------------------------------------------------------------------------- Encounter Discharge Information Details Patient Name: Date of Service: Cheyenne Mccallum B. 05/25/2020 8:00 A M Medical Record Number: [id number] Patient Account Number: [id number] Date of Birth/Sex: Treating RN: 08/09/1961 (59 y.o. [name] Primary Care Mabrey Howland: [name], [place] Other Clinician: Referring  Bronco Mcgrory: Treating Amr Sturtevant/Extender: Dayna Ramus, [place] Weeks in Treatment: 20 Encounter Discharge Information Items Discharge Condition: Stable Ambulatory Status: Ambulatory Discharge Destination: Home Transportation: Private Auto Accompanied By: self Schedule Follow-up Appointment: Yes Clinical Summary of Care: Electronic Signature(s) Signed: 05/25/2020 5:01:59 PM By: Deon Pilling Entered By: Deon Pilling on 05/25/2020 08:48:58 -------------------------------------------------------------------------------- Lower Extremity Assessment Details Patient Name: Date of Service: [name], [name] 05/25/2020 8:00 A M Medical Record Number: [id number] Patient Account Number: [id number] Date of Birth/Sex: Treating RN: 1961-04-14 (59 y.o. [name] Primary Care Jeffie Spivack: [place], [place] Other Clinician: Referring Maralyn Witherell: Treating Kyston Gonce/Extender: Dayna Ramus, [place] Weeks in Treatment: 34 Edema Assessment Assessed: [Left: No] [Right: No] Edema: [Left: N] [Right: o] Calf Left: Right: Point of Measurement: 29 cm From Medial Instep 31 cm cm Ankle Left: Right: Point of Measurement: 8 cm From Medial Instep 21 cm cm Vascular Assessment Pulses: Dorsalis Pedis Palpable: [Left:Yes] Electronic Signature(s) Signed: 05/25/2020 4:54:19 PM By: Levan Hurst RN, BSN Entered By: Levan Hurst on 05/25/2020 08:16:40 -------------------------------------------------------------------------------- [place] Details Patient Name: Date of Service: Cheyenne Mccallum B. 05/25/2020 8:00 A M Medical Record Number: [id number] Patient Account Number: [id number] Date of Birth/Sex: Treating RN: [birth year] (59 y.o. [name] Primary Care Delvon Chipps: [name], [place] Other Clinician: Referring Chayna Surratt: Treating Ashlay Altieri/Extender: Dayna Ramus, [place] Weeks in Treatment: 60 Active Inactive Venous Leg Ulcer Nursing Diagnoses: Knowledge  deficit related to disease process and management Potential for venous Insuffiency (use before diagnosis confirmed) Goals: Patient will maintain optimal edema control Date Initiated: 09/30/2019 Target Resolution Date: 06/22/2020 Goal Status: Active Patient/caregiver will verbalize understanding of disease process and disease management Date Initiated: 09/30/2019 Date Inactivated: 11/25/2019 Target Resolution Date: 11/25/2019 Goal Status: Met Interventions: Assess peripheral edema status every visit. Compression as ordered Provide education on venous insufficiency Treatment Activities: Therapeutic compression applied : 09/30/2019 Notes:  Wound/Skin Impairment Nursing Diagnoses: Impaired tissue integrity Knowledge deficit related to ulceration/compromised skin integrity Goals: Patient/caregiver will verbalize understanding of skin care regimen Date Initiated: 09/30/2019 Target Resolution Date: 06/22/2020 Goal Status: Active Ulcer/skin breakdown will have a volume reduction of 30% by week 4 Date Initiated: 09/30/2019 Date Inactivated: 10/28/2019 Target Resolution Date: 10/28/2019 Goal Status: Unmet Unmet Reason: other comorbidities Interventions: Assess patient/caregiver ability to obtain necessary supplies Assess patient/caregiver ability to perform ulcer/skin care regimen upon admission and as needed Assess ulceration(s) every visit Provide education on ulcer and skin care Treatment Activities: Skin care regimen initiated : 09/30/2019 Topical wound management initiated : 09/30/2019 Notes: Electronic Signature(s) Signed: 05/25/2020 5:11:27 PM By: Baruch Gouty RN, BSN Entered By: Baruch Gouty on 05/25/2020 08:32:52 -------------------------------------------------------------------------------- Pain Assessment Details Patient Name: Date of Service: [name] 05/25/2020 8:00 A M Medical Record Number: [id number] Patient Account Number: [id number] Date of  Birth/Sex: Treating RN: July 07, 1961 (59 y.o. [name] Primary Care Karsyn Rochin: [name], [place] Other Clinician: Referring Aashna Matson: Treating Sharai Overbay/Extender: Dayna Ramus, [place] Weeks in Treatment: 71 Active Problems Location of Pain Severity and Description of Pain Patient Has Paino No Site Locations Pain Management and Medication Current Pain Management: Electronic Signature(s) Signed: 05/25/2020 4:54:19 PM By: Levan Hurst RN, BSN Entered By: Levan Hurst on 05/25/2020 08:13:38 -------------------------------------------------------------------------------- Patient/Caregiver Education Details Patient Name: Date of Service: [name], Cheyenne B. 9/22/2021andnbsp8:00 [place] Record Number: [id number] Patient Account Number: [id number] Date of Birth/Gender: Treating RN: 1961/03/10 (59 y.o. [name] Primary Care Physician: [name], [place] Other Clinician: Referring Physician: Treating Physician/Extender: Dayna Ramus, [place] Weeks in Treatment: 70 Education Assessment Education Provided To: Patient Education Topics Provided Venous: Methods: Explain/Verbal Responses: Reinforcements needed, State content correctly Wound/Skin Impairment: Methods: Explain/Verbal Responses: Reinforcements needed, State content correctly Electronic Signature(s) Signed: 05/25/2020 5:11:27 PM By: Baruch Gouty RN, BSN Entered By: Baruch Gouty on 05/25/2020 08:33:13 -------------------------------------------------------------------------------- Wound Assessment Details Patient Name: Date of Service: [name] 05/25/2020 8:00 A M Medical Record Number: [id number] Patient Account Number: [id number] Date of Birth/Sex: Treating RN: April 21, 1961 (59 y.o. [name] Primary Care Carlota Philley: [name], [place] Other Clinician: Referring Sanvi Ehler: Treating Trindon Dorton/Extender: Dayna Ramus, [place] Weeks in Treatment: 34 Wound  Status Wound Number: 2 Primary Etiology: Venous Leg Ulcer Wound Location: Left, Anterior Lower Leg Wound Status: Open Wounding Event: Trauma Comorbid History: Lymphedema, Raynauds, Neuropathy Date Acquired: 08/06/2019 Weeks Of Treatment: 34 Clustered Wound: No Wound Measurements Length: (cm) 1.1 Width: (cm) 0.5 Depth: (cm) 0.2 Area: (cm) 0.432 Volume: (cm) 0.086 % Reduction in Area: 97.6% % Reduction in Volume: 97.6% Epithelialization: Large (67-100%) Tunneling: No Wound Description Classification: Full Thickness With Exposed Support Structures Wound Margin: Distinct, outline attached Exudate Amount: Medium Exudate Type: Serosanguineous Exudate Color: red, brown Foul Odor After Cleansing: No Slough/Fibrino No Wound Bed Granulation Amount: Large (67-100%) Exposed Structure Granulation Quality: Pink Fascia Exposed: No Necrotic Amount: None Present (0%) Fat Layer (Subcutaneous Tissue) Exposed: Yes Tendon Exposed: No Muscle Exposed: No Joint Exposed: No Bone Exposed: No Treatment Notes Wound #2 (Left, Anterior Lower Leg) 1. Cleanse With Wound Cleanser 2. Periwound Care Moisturizing lotion TCA Cream 3. Primary Dressing Applied Collegen AG Hydrogel or K-Y Jelly 4. Secondary Dressing Other secondary dressing (specify in notes) 6. Support Layer Applied Kerlix/Coban Notes vasoline gauze over prisma. Electronic Signature(s) Signed: 05/25/2020 4:54:19 PM By: Levan Hurst RN, BSN Entered By: Levan Hurst on 05/25/2020 08:16:58 -------------------------------------------------------------------------------- [place] Details Patient Name: Date of Service: Cheyenne Mccallum B. 05/25/2020 8:00 A M Medical  Record Number: [id number] Patient Account Number: [id number] Date of Birth/Sex: Treating RN: May 25, 1961 (59 y.o. [name] Primary Care Niza Soderholm: [name], [place] Other Clinician: Referring Keola Heninger: Treating Cloria Ciresi/Extender: Dayna Ramus,  [place] Weeks in Treatment: 36 Vital Signs Time Taken: 08:13 Temperature (F): 98.7 Height (in): 66 Pulse (bpm): 79 Weight (lbs): 156 Respiratory Rate (breaths/min): 16 Body Mass Index (BMI): 25.2 Blood Pressure (mmHg): 115/81 Reference Range: 80 - 120 mg / dl Electronic Signature(s) Signed: 05/25/2020 4:54:19 PM By: Levan Hurst RN, BSN Entered By: Levan Hurst on 05/25/2020 08:13:32

## 2020-05-25 NOTE — Progress Notes (Signed)
VALLERY, MCDADE ([id number]) Visit Report for 05/25/2020 Chief Complaint Document Details Patient Name: Date of Service: [name], [name] 05/25/2020 8:00 A M Medical Record Number: [id number] Patient Account Number: [id number] Date of Birth/Sex: Treating RN: July 26, 1961 (59 y.o. [name] Standard Primary Care Provider: Docia Chuck, [place] Other Clinician: Referring Provider: Treating Provider/Extender: Richardo Priest, [place] Weeks in Treatment: 37 Information Obtained from: Patient Chief Complaint Left LE Ulcers Electronic Signature(s) Signed: 05/25/2020 8:53:02 AM By: Lenda Kelp PA-C Entered By: Lenda Kelp on 05/25/2020 08:53:02 -------------------------------------------------------------------------------- HPI Details Patient Name: Date of Service: Cheyenne Aran B. 05/25/2020 8:00 A M Medical Record Number: [id number] Patient Account Number: [id number] Date of Birth/Sex: Treating RN: June 30, 1961 (59 y.o. [name] Standard Primary Care Provider: Docia Chuck, [place] Other Clinician: Referring Provider: Treating Provider/Extender: Richardo Priest, [place] Weeks in Treatment: 58 History of Present Illness HPI Description: 09/30/2019 upon evaluation today patient presents for initial inspection here in our clinic concerning issues that she has been experiencing with a wound on her left anterior lower leg. She unfortunately had a significant laceration which required 19 sutures and she shows me the pictures both before as well as after sutured in a very good job was done in this regard. Unfortunately the skin did not take and necrosis. Subsequently this wound open and she has been having more significant issues with that since. Fortunately there is no evidence of active infection at this time which is good news. No fever chills noted. She has a history of chronic venous stasis but is no longer able to wear any compression in fact she cannot even wear regular socks due to  her neuropathy and the pain that she experiences in her feet if she does. For that reason she tries to elevate her legs as much as she can at nighttime but again she really cannot wear any type of compression. With regard to antibiotic it does not appear that she has been on anything recently. 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 Fortunately there is no evidence of local or systemic infection. No fevers, chills, nausea, vomiting, or diarrhea. 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 10/07/2019 on evaluation today patient presents for follow-up concerning her wound on the left lower extremity. She has been tolerating the dressing changes without complication. With that being said she just got the Santyl 2 days ago. Obviously it has not had enough time to really do what we need to do she would like to obviously give this some time before proceeding with more aggressive debridement or otherwise. 10/21/2019 upon evaluation today patient appears to be doing better with regard to the overall appearance of her wound is not quite as dry I do believe the Santyl has helped her improve to some degree here. Fortunately there is no signs of active infection at this time. I think we may be able to clean off the wound with a little bit of additional debridement today and likely we can transition away from the Santyl to some kind of collagen type dressing. 10/28/2019 upon evaluation today patient actually appears to be doing fairly well with regard to her wound today. She has been tolerating the dressing changes  we did switch to collagen and fortunately that seems to be doing well for her at this point. There does not seem to be any signs of active infection I do feel like the  surface of the wound is looking better at this time as well. 11/04/2019 upon evaluation today patient appears to be doing well with regard to her wound. She is making some progress here and the wound does appear to be measuring smaller which is good news. Overall this is good to be somewhat slow but nonetheless I am encouraged by the fact that the collagen does seem to have helped her. 11/11/2019 on evaluation today patient's wound is showing some signs of improvement. The good news is her overall trend does seem to be towards getting better compared to where she was previous. The smaller wounds along the side actually might even be healed but we can watch this 1 more week before I heal this out. Overall I feel like there is improvement here. She is still using the silver collagen. 11/18/2019 upon evaluation today patient appears to be doing about the same with regard to her wound. This is not significantly smaller there still appears to be a lot of inflammation. For that reason I do want to see about actually applying triamcinolone to the actual wound bed to see if this can be of benefit as well. I thought about this for couple weeks but I think it may be a good thing to proceed with at this point. 11/25/2019 on evaluation today patient appears to be doing well with regard to her lower extremity ulcer. She has been tolerating the dressing changes without complication. Fortunately there is no signs of active infection at this time. No fevers, chills, nausea, vomiting, or diarrhea. 12/02/2019 on evaluation today patient appears to be doing okay with regard to her ulcer on the leg. Fortunately there is no signs of active infection at this time. No fever chills noted she has been tolerating the dressing changes without complication. With that being said overall I am pleased with the fact that there is no signs of infection but at the same time I really feel like she needs more to progress moving week to week  and right now were really not seeing that. We have put in for approval for Apligraf which I think could help to move things along. With that being said we have not heard anything regarding approval at this point. 12/10/2019; patient comes in on my day although have not seen her previously. She has what was a traumatic wound on the left anterior mid tibia. Initially sutured however the area never really healed according to the patient. She has been using Santyl now using Prisma. There is exposed tendon. We have made application for Apligraf apparently this is going through [employer] still has not been approved. She arrives in clinic with erythema around the wound and 4 small circular papules superior to the wound. These are nontender and she is not complaining of any more pain. The exact cause of this is not really clear. The patient has idiopathic peripheral neuropathy which for which she has been extensively evaluated in the past she is not a diabetic she does not have any rheumatologic problems she is aware of. She cannot wear compression because it aggravates her neuropathy 4/15; traumatic wound on the left anterior mid tibia. She arrives today with extensive erythema spreading from the wound medially towards her heel. This is tender swollen and warm compatible with cellulitis.  We are using silver collagen to the wound 4/20; 5-day follow-up. Traumatic wound on the left anterior mid tibia. This is in follow-up for the cellulitis she had on her visit 5 days ago. I gave her empiric cephalexin I did not culture this. She arrives today with the erythema that we marked much improved. She has not been systemically unwell Upon evaluation today patient's wound unfortunately is not doing very well this in fact is somewhat deeper than even last week4/28/2021 which is unfortunate. I am very concerned about the fact that to be honest that the patient does not seem to be doing as good as we would have  expected in the past 2 weeks since I last saw her. Fortunately there is no evidence of active infection systemically which is good news. No fevers, chills, nausea, vomiting, or diarrhea. She has been on Keflex. 01/06/2020 upon evaluation today patient unfortunately appears to still be doing somewhat poorly overall in regard to her ulcer. There is definite signs of infection. She has been on Keflex in April but unfortunately this just does not seem to be doing as well as we would like. I do believe that we will get a need to place her on Augmentin in order to try to get this better. She tested positive for Proteus and E. coli on the culture and is allergic to doxycycline. The patient's wound currently is not looking healthy at all to me. 01/27/2020 upon evaluation today patient appears to be doing maybe slightly better with regard to the wound bed at this point. Her infection is definitely improved which is great news. As far as the Apligraf I do not believe the wound surface is quite clean enough yet in order for [place] to apply the Apligraf. Another potential option would be for a snap VAC to be used under the Curlex and Coban wrap working to try the wrap today. If we can get rid of some of the fluid I think this will help her with healing also think the snap VAC could be beneficial in this regard. Nonetheless again I am not given up on the Apligraf is a possibility I just do not want a waste the applications by putting it on before the wound surface is ready. Potentially the snap VAC can get this wound to the point where it is ready much more quickly. 02/03/2020 upon evaluation today patient appears to be doing excellent with regard to her leg ulcer compared to where things have been. The overall quality of the wound bed is significantly improved and very pleased in this regard. We did get approval for the snap VAC which I am wanting to go ahead and see about initiating at this point. We also have approval  for the Apligraf when and if the time is appropriate for this. With that being said I really think using the snap VAC to try to get this to fill-in would be appropriate to begin with. The patient is in agreement with that plan. I do believe the compression did help her and she states that that was uncomfortable she was able to manage with that for now. 02/10/2020 upon evaluation today patient appears to be doing better with regard to her original and largest wound. 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  I explained that pyoderma is incompletely understood and obviously very difficult sometimes to treat but often steroid therapy is the mainstay of treatment. I think topical steroid treatment is appropriate at this point. She does have mometasone which has been prescribed previously by her primary care provider and she has that with her so we can use that on some of the satellite areas not on the main wound. The patient is in agreement with that plan. 02/17/2020 upon evaluation today patient appears to be doing well with regard to her wound. She is making good progress with the snap VAC actually believe that she is in a good position for [place] to be able to continue the snap VAC but actually at the Apligraf to try to speed up the granulation and epithelial growth. The patient is actually in agreement with that she wants to try to get this healed as quickly as possible I completely agree with her. She did fill up the canister again today. 03/02/2020 upon evaluation today patient appears to be doing well at this time with regard to her wound. In fact this is dramatically improved with the use of the snap VAC and the Apligraf even since the last time I saw her 2 weeks ago. Again week she had a nurse visit and changed  out the snap VAC last week. Overall I think that she is definitely in a much better place and this wound is definitely making progress measuring smaller and overall I think progressing quite well. 7/13; wound surface looks very healthy. She has a small superior satellite lesion as well. Apligraf applied with a snap VAC. 03/30/2020 on evaluation today patient presents for follow-up concerning her lower extremity ulcers. Fortunately there is no signs of active infection at this time which is great news she has been tolerating the dressing changes without complication. Fortunately there is no evidence of anything really worsening the smaller wound more proximal does have some slough noted that is can require sharp debridement today however the main wound for which we have been applying Apligraf and the wound VAC seems to be doing quite well. I am extremely pleased with where things stand today. 04/13/2020 upon evaluation today patient appears to be doing much better in regard to the larger of her 2 wounds. I think we do not even really need the snap VAC any longer at this point. With regard to the smaller wound I think that this is good to be something that will continue to be beneficial as far as using the snap VAC on is concerned. We are going to reapply the Apligraf today. 04/27/2020 on evaluation today patient appears to be doing excellent in regard to both of her wounds. The snap VAC help the smaller area to feeling quite well and overall I think were very close to getting both to close. I believe collagen may be a good option at this point. 05/04/2020 upon evaluation today patient appears to be doing very well in regard to her wounds. Everything is measuring smaller and seems to be doing quite well. Overall very pleased with where things stand and the patient is extremely happy. Both wounds are getting closer and closer to complete closure and the smaller of the 2 is almost completely healed. Overall  very excited for her. 05/11/2020 on evaluation today patient appears to be doing well with regard to her wound. She has been tolerating the dressing changes without complication the Prisma has done excellent for her. The smaller of the 2 does  appear to be completely healed the larger is significantly improved and overall I am very pleased with where things stand today. There is no evidence of active infection at this time. 05/18/2020 on evaluation today patient appears to be doing excellent in regard to her leg ulcer. In fact this is measuring about half the size it was last week. Overall I am extremely pleased. I do believe the collagen is doing a great job. 05/25/2020 on evaluation today patient actually appears to be making excellent progress in regard to her wound overall I feel like she has done extremely well at this point. There is no evidence of active infection and overall I am pleased with the appearance of everything today. The patient is extremely happy. Electronic Signature(s) Signed: 05/25/2020 4:48:28 PM By: Lenda Kelp PA-C Entered By: Lenda Kelp on 05/25/2020 16:48:28 -------------------------------------------------------------------------------- Physical Exam Details Patient Name: Date of Service: [name], [name] 05/25/2020 8:00 A M Medical Record Number: [id number] Patient Account Number: [id number] Date of Birth/Sex: Treating RN: Jul 20, 1961 (59 y.o. [name] Standard Primary Care Provider: Docia Chuck, [place] Other Clinician: Referring Provider: Treating Provider/Extender: Richardo Priest, [place] Weeks in Treatment: 70 Constitutional Well-nourished and well-hydrated in no acute distress. Respiratory normal breathing without difficulty. Psychiatric this patient is able to make decisions and demonstrates good insight into disease process. Alert and Oriented x 3. pleasant and cooperative. Notes Upon inspection patient's wound bed showed to be very clean there was  no sharp debridement necessary and overall I think the collagen is doing what he needs to do wound and recommend continuing at this point. Electronic Signature(s) Signed: 05/25/2020 4:48:45 PM By: Lenda Kelp PA-C Entered By: Lenda Kelp on 05/25/2020 16:48:44 -------------------------------------------------------------------------------- Physician Orders Details Patient Name: Date of Service: [name] 05/25/2020 8:00 A M Medical Record Number: [id number] Patient Account Number: [id number] Date of Birth/Sex: Treating RN: 1961-07-24 (59 y.o. 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 Dressing Change Frequency Wound #2 Left,Anterior Lower Leg Other: - 2 times per week Skin Barriers/Peri-Wound Care Moisturizing lotion TCA Cream or Ointment - or menometasone cream to irritated periwound skin Wound Cleansing Wound #2 Left,Anterior Lower Leg May shower with protection. Primary Wound Dressing Wound #2 Left,Anterior Lower Leg Silver Collagen - moisten with hydrogel or [place] gel Secondary Dressing Wound #2 Left,Anterior Lower Leg Telfa (non adherent pad) Other: - vaseline gauze Edema Control Kerlix and Coban - Left Lower  Extremity Avoid standing for long periods of time Elevate legs to the level of the heart or above for 30 minutes daily and/or when sitting, a frequency of: - throughout the day Exercise regularly Electronic Signature(s) Signed: 05/25/2020 5:11:27 PM By: Zenaida Deed RN, BSN Signed: 05/25/2020 5:16:27 PM By: Lenda Kelp PA-C Entered By: Zenaida Deed on 05/25/2020 08:34:55 -------------------------------------------------------------------------------- Problem List Details Patient Name: Date of Service: Cheyenne Aran B. 05/25/2020 8:00 A M Medical Record Number: [id number] Patient  Account Number: [id number] Date of Birth/Sex: Treating RN: [birth year] (59 y.o. [name] Standard Primary Care Provider: Docia Chuck, [place] Other Clinician: Referring Provider: Treating Provider/Extender: Richardo Priest, [place] Weeks in Treatment: 28 Active Problems ICD-10 Encounter Code Description Active Date MDM Diagnosis I87.2 Venous insufficiency (chronic) (peripheral) 09/30/2019 No Yes L97.822 Non-pressure chronic ulcer of other part of left lower leg with fat layer exposed 09/30/2019 No Yes L03.116 Cellulitis of left lower limb 12/17/2019 No Yes L97.828 Non-pressure chronic ulcer of other part of left lower leg with other specified 09/30/2019 No Yes severity L97.322 Non-pressure chronic ulcer of left ankle with fat layer exposed 02/03/2020 No Yes 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 05/25/2020 8:00 A M Medical Record  Number: [id number] Patient Account Number: [id number] Date of Birth/Sex: Treating RN: 1960/09/18 (59 y.o. [name] Standard Primary Care Provider: Docia Chuck, [place] Other Clinician: Referring Provider: Treating Provider/Extender: Richardo Priest, [place] Weeks in Treatment: 19 Subjective Chief Complaint Information obtained from Patient Left LE Ulcers History of Present Illness (HPI) 09/30/2019 upon evaluation today patient presents for initial inspection here in our clinic concerning issues that she has been experiencing with a wound on her left anterior lower leg. She unfortunately had a significant laceration which required 19 sutures and she shows me the pictures both before as well as after sutured in a very good job was done in this regard. Unfortunately the skin did not take and necrosis. Subsequently this wound open and she has been having more significant issues with that since. Fortunately there is no evidence of active infection at this time which is good news. No fever chills noted. She has a history of chronic venous stasis but is no longer able to wear any compression in fact she cannot even wear regular socks due to her neuropathy and the pain that she experiences in her feet if she does. For that reason she tries to elevate her legs as much as she can at nighttime but again she really cannot wear any type of compression. With regard to antibiotic it does not appear that she has been on anything recently. 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 Fortunately there is no evidence of local or systemic infection. No fevers, chills, nausea, vomiting, or diarrhea. 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 10/07/2019 on evaluation today patient  presents for follow-up concerning her wound on the left lower extremity. She has been tolerating the dressing changes without complication. With that being said she just got the Santyl 2 days ago. Obviously it has not had enough time to really do what we need to do she would like to obviously give this some time before proceeding with more aggressive debridement or otherwise. 10/21/2019 upon evaluation today patient appears to be doing better with regard to the overall appearance of her wound is not quite as dry I do believe the [facility]  has helped her improve to some degree here. Fortunately there is no signs of active infection at this time. I think we may be able to clean off the wound with a little bit of additional debridement today and likely we can transition away from the Santyl to some kind of collagen type dressing. 10/28/2019 upon evaluation today patient actually appears to be doing fairly well with regard to her wound today. She has been tolerating the dressing changes we did switch to collagen and fortunately that seems to be doing well for her at this point. There does not seem to be any signs of active infection I do feel like the surface of the wound is looking better at this time as well. 11/04/2019 upon evaluation today patient appears to be doing well with regard to her wound. She is making some progress here and the wound does appear to be measuring smaller which is good news. Overall this is good to be somewhat slow but nonetheless I am encouraged by the fact that the collagen does seem to have helped her. 11/11/2019 on evaluation today patient's wound is showing some signs of improvement. The good news is her overall trend does seem to be towards getting better compared to where she was previous. The smaller wounds along the side actually might even be healed but we can watch this 1 more week before I heal this out. Overall I feel like there is improvement here. She is still using the  silver collagen. 11/18/2019 upon evaluation today patient appears to be doing about the same with regard to her wound. This is not significantly smaller there still appears to be a lot of inflammation. For that reason I do want to see about actually applying triamcinolone to the actual wound bed to see if this can be of benefit as well. I thought about this for couple weeks but I think it may be a good thing to proceed with at this point. 11/25/2019 on evaluation today patient appears to be doing well with regard to her lower extremity ulcer. She has been tolerating the dressing changes without complication. Fortunately there is no signs of active infection at this time. No fevers, chills, nausea, vomiting, or diarrhea. 12/02/2019 on evaluation today patient appears to be doing okay with regard to her ulcer on the leg. Fortunately there is no signs of active infection at this time. No fever chills noted she has been tolerating the dressing changes without complication. With that being said overall I am pleased with the fact that there is no signs of infection but at the same time I really feel like she needs more to progress moving week to week and right now were really not seeing that. We have put in for approval for Apligraf which I think could help to move things along. With that being said we have not heard anything regarding approval at this point. 12/10/2019; patient comes in on my day although have not seen her previously. She has what was a traumatic wound on the left anterior mid tibia. Initially sutured however the area never really healed according to the patient. She has been using Santyl now using Prisma. There is exposed tendon. We have made application for Apligraf apparently this is going through [employer] still has not been approved. She arrives in clinic with erythema around the wound and 4 small circular papules superior to the wound. These are nontender and she is not  complaining of any more pain. The exact cause of  this is not really clear. The patient has idiopathic peripheral neuropathy which for which she has been extensively evaluated in the past she is not a diabetic she does not have any rheumatologic problems she is aware of. She cannot wear compression because it aggravates her neuropathy 4/15; traumatic wound on the left anterior mid tibia. She arrives today with extensive erythema spreading from the wound medially towards her heel. This is tender swollen and warm compatible with cellulitis. We are using silver collagen to the wound 4/20; 5-day follow-up. Traumatic wound on the left anterior mid tibia. This is in follow-up for the cellulitis she had on her visit 5 days ago. I gave her empiric cephalexin I did not culture this. She arrives today with the erythema that we marked much improved. She has not been systemically unwell Upon evaluation today patient's wound unfortunately is not doing very well this in fact is somewhat deeper than even last week4/28/2021 which is unfortunate. I am very concerned about the fact that to be honest that the patient does not seem to be doing as good as we would have expected in the past 2 weeks since I last saw her. Fortunately there is no evidence of active infection systemically which is good news. No fevers, chills, nausea, vomiting, or diarrhea. She has been on Keflex. 01/06/2020 upon evaluation today patient unfortunately appears to still be doing somewhat poorly overall in regard to her ulcer. There is definite signs of infection. She has been on Keflex in April but unfortunately this just does not seem to be doing as well as we would like. I do believe that we will get a need to place her on Augmentin in order to try to get this better. She tested positive for Proteus and E. coli on the culture and is allergic to doxycycline. The patient's wound currently is not looking healthy at all to me. 01/27/2020 upon  evaluation today patient appears to be doing maybe slightly better with regard to the wound bed at this point. Her infection is definitely improved which is great news. As far as the Apligraf I do not believe the wound surface is quite clean enough yet in order for [place] to apply the Apligraf. Another potential option would be for a snap VAC to be used under the Curlex and Coban wrap working to try the wrap today. If we can get rid of some of the fluid I think this will help her with healing also think the snap VAC could be beneficial in this regard. Nonetheless again I am not given up on the Apligraf is a possibility I just do not want a waste the applications by putting it on before the wound surface is ready. Potentially the snap VAC can get this wound to the point where it is ready much more quickly. 02/03/2020 upon evaluation today patient appears to be doing excellent with regard to her leg ulcer compared to where things have been. The overall quality of the wound bed is significantly improved and very pleased in this regard. We did get approval for the snap VAC which I am wanting to go ahead and see about initiating at this point. We also have approval for the Apligraf when and if the time is appropriate for this. With that being said I really think using the snap VAC to try to get this to fill-in would be appropriate to begin with. The patient is in agreement with that plan. I do believe the compression did help her and  she states that that was uncomfortable she was able to manage with that for now. 02/10/2020 upon evaluation today patient appears to be doing better with regard to her original and largest wound. 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 I explained that pyoderma is incompletely understood and  obviously very difficult sometimes to treat but often steroid therapy is the mainstay of treatment. I think topical steroid treatment is appropriate at this point. She does have mometasone which has been prescribed previously by her primary care provider and she has that with her so we can use that on some of the satellite areas not on the main wound. The patient is in agreement with that plan. 02/17/2020 upon evaluation today patient appears to be doing well with regard to her wound. She is making good progress with the snap VAC actually believe that she is in a good position for [place] to be able to continue the snap VAC but actually at the Apligraf to try to speed up the granulation and epithelial growth. The patient is actually in agreement with that she wants to try to get this healed as quickly as possible I completely agree with her. She did fill up the canister again today. 03/02/2020 upon evaluation today patient appears to be doing well at this time with regard to her wound. In fact this is dramatically improved with the use of the snap VAC and the Apligraf even since the last time I saw her 2 weeks ago. Again week she had a nurse visit and changed out the snap VAC last week. Overall I think that she is definitely in a much better place and this wound is definitely making progress measuring smaller and overall I think progressing quite well. 7/13; wound surface looks very healthy. She has a small superior satellite lesion as well. Apligraf applied with a snap VAC. 03/30/2020 on evaluation today patient presents for follow-up concerning her lower extremity ulcers. Fortunately there is no signs of active infection at this time which is great news she has been tolerating the dressing changes without complication. Fortunately there is no evidence of anything really worsening the smaller wound more proximal does have some slough noted that is can require sharp debridement today however the main wound for  which we have been applying Apligraf and the wound VAC seems to be doing quite well. I am extremely pleased with where things stand today. 04/13/2020 upon evaluation today patient appears to be doing much better in regard to the larger of her 2 wounds. I think we do not even really need the snap VAC any longer at this point. With regard to the smaller wound I think that this is good to be something that will continue to be beneficial as far as using the snap VAC on is concerned. We are going to reapply the Apligraf today. 04/27/2020 on evaluation today patient appears to be doing excellent in regard to both of her wounds. The snap VAC help the smaller area to feeling quite well and overall I think were very close to getting both to close. I believe collagen may be a good option at this point. 05/04/2020 upon evaluation today patient appears to be doing very well in regard to her wounds. Everything is measuring smaller and seems to be  doing quite well. Overall very pleased with where things stand and the patient is extremely happy. Both wounds are getting closer and closer to complete closure and the smaller of the 2 is almost completely healed. Overall very excited for her. 05/11/2020 on evaluation today patient appears to be doing well with regard to her wound. She has been tolerating the dressing changes without complication the Prisma has done excellent for her. The smaller of the 2 does appear to be completely healed the larger is significantly improved and overall I am very pleased with where things stand today. There is no evidence of active infection at this time. 05/18/2020 on evaluation today patient appears to be doing excellent in regard to her leg ulcer. In fact this is measuring about half the size it was last week. Overall I am extremely pleased. I do believe the collagen is doing a great job. 05/25/2020 on evaluation today patient actually appears to be making excellent progress in regard to  her wound overall I feel like she has done extremely well at this point. There is no evidence of active infection and overall I am pleased with the appearance of everything today. The patient is extremely happy. Objective Constitutional Well-nourished and well-hydrated in no acute distress. Vitals Time Taken: 8:13 AM, Height: 66 in, Weight: 156 lbs, BMI: 25.2, Temperature: 98.7 F, Pulse: 79 bpm, Respiratory Rate: 16 breaths/min, Blood Pressure: 115/81 mmHg. Respiratory normal breathing without difficulty. Psychiatric this patient is able to make decisions and demonstrates good insight into disease process. Alert and Oriented x 3. pleasant and cooperative. General Notes: Upon inspection patient's wound bed showed to be very clean there was no sharp debridement necessary and overall I think the collagen is doing what he needs to do wound and recommend continuing at this point. Integumentary (Hair, Skin) Wound #2 status is Open. Original cause of wound was Trauma. The wound is located on the Left,Anterior Lower Leg. The wound measures 1.1cm length x 0.5cm width x 0.2cm depth; 0.432cm^2 area and 0.086cm^3 volume. There is Fat Layer (Subcutaneous Tissue) exposed. There is no tunneling noted. There is a medium amount of serosanguineous drainage noted. The wound margin is distinct with the outline attached to the wound base. There is large (67-100%) pink granulation within the wound bed. There is no necrotic tissue within the wound bed. 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 Dressing Change Frequency: Wound #2 Left,Anterior Lower Leg: Other: - 2 times per week Skin Barriers/Peri-Wound Care: Moisturizing lotion TCA Cream or Ointment - or menometasone cream to irritated periwound skin Wound Cleansing: Wound #2 Left,Anterior Lower Leg: May shower with protection. Primary Wound Dressing: Wound #2 Left,Anterior Lower Leg: Silver Collagen - moisten with hydrogel or KY gel Secondary Dressing: Wound #2 Left,Anterior Lower Leg: T (non adherent pad) elfa Other: - vaseline gauze Edema Control: Kerlix and Coban - Left Lower Extremity Avoid standing for long periods of time Elevate legs to the level of the heart or above for 30 minutes daily and/or when sitting, a frequency of: - throughout the day Exercise regularly 1. I [name] suggest that we continue with the silver  collagen dressing I think that is doing a great job for the patient. 2. Also can recommend that we continue with the compression I feel like that doing a great job keeping the edema under control this is a Curlex and Coban wrap. 3. I would also recommend she continue to monitor for any signs of infection I see nothing right now but if anything changes she will let me know. We will see patient back for reevaluation in 1 week here in the clinic. If anything worsens or changes patient will contact our office for additional recommendations. Electronic Signature(s) Signed: 05/25/2020 4:53:02 PM By: Lenda Kelp PA-C Entered By: Lenda Kelp on 05/25/2020 16:53:02 -------------------------------------------------------------------------------- SuperBill Details Patient Name: Date of Service: [name] 05/25/2020 Medical Record Number: [id number] Patient Account Number: [id number] Date of Birth/Sex: Treating RN: [id number] (59 y.o. [name] Standard Primary Care Provider: Docia Chuck, [place] Other Clinician: Referring  Provider: Treating Provider/Extender: Richardo Priest, [place] Weeks in Treatment: 34 Diagnosis Coding ICD-10 Codes Code Description I87.2 Venous insufficiency (chronic) (peripheral) L97.822 Non-pressure chronic ulcer of other part of left lower leg with fat layer exposed L03.116 Cellulitis of left lower limb L97.828 Non-pressure chronic ulcer of other part of left lower leg with other specified severity L97.322 Non-pressure chronic ulcer of left ankle with fat layer exposed 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 I73.00 Raynaud's syndrome without gangrene 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, unspecified Facility Procedures CPT4 Code: [id number] Description: 99213 - WOUND CARE VISIT-LEV 3 EST PT Modifier: Quantity: 1 Physician Procedures : CPT4 Code Description Modifier [id number] 99213 - WC PHYS LEVEL 3 - EST PT ICD-10 Diagnosis Description I87.2 Venous insufficiency (chronic) (peripheral) L97.822 Non-pressure chronic ulcer of other part of left lower leg with fat layer exposed L03.116  Cellulitis of left lower limb L97.828 Non-pressure chronic ulcer of other part of left lower leg with other specified severity Quantity: 1 Electronic Signature(s) Signed: 05/25/2020 4:53:45 PM By: Lenda Kelp PA-C Entered By: Lenda Kelp on 05/25/2020 16:53:44

## 2020-06-01 ENCOUNTER — Encounter (HOSPITAL_BASED_OUTPATIENT_CLINIC_OR_DEPARTMENT_OTHER): Payer: 59 | Attending: Physician Assistant | Admitting: Physician Assistant

## 2020-06-01 DIAGNOSIS — Z872 Personal history of diseases of the skin and subcutaneous tissue: Secondary | ICD-10-CM | POA: Diagnosis not present

## 2020-06-01 DIAGNOSIS — I872 Venous insufficiency (chronic) (peripheral): Secondary | ICD-10-CM | POA: Insufficient documentation

## 2020-06-01 DIAGNOSIS — G609 Hereditary and idiopathic neuropathy, unspecified: Secondary | ICD-10-CM | POA: Insufficient documentation

## 2020-06-01 NOTE — Progress Notes (Addendum)
KENDA, KLOEHN ([id number]) Visit Report for 06/01/2020 Chief Complaint Document Details Patient Name: Date of Service: [name], [name] 06/01/2020 8:00 A M Medical Record Number: [id number] Patient Account Number: [id number] Date of Birth/Sex: Treating RN: July 20, 1961 (59 y.o. [name] Standard Primary Care Provider: Docia Chuck, [place] Other Clinician: Referring Provider: Treating Provider/Extender: Richardo Priest, [place] Weeks in Treatment: 79 Information Obtained from: Patient Chief Complaint Left LE Ulcers Electronic Signature(s) Signed: 06/01/2020 8:09:36 AM By: Lenda Kelp PA-C Entered By: Lenda Kelp on 06/01/2020 08:09:35 -------------------------------------------------------------------------------- HPI Details Patient Name: Date of Service: Cheyenne Aran B. 06/01/2020 8:00 A M Medical Record Number: [id number] Patient Account Number: [id number] Date of Birth/Sex: Treating RN: 07/20/1961 (59 y.o. [name] Standard Primary Care Provider: Docia Chuck, [place] Other Clinician: Referring Provider: Treating Provider/Extender: Richardo Priest, [place] Weeks in Treatment: 35 History of Present Illness HPI Description: 09/30/2019 upon evaluation today patient presents for initial inspection here in our clinic concerning issues that she has been experiencing with a wound on her left anterior lower leg. She unfortunately had a significant laceration which required 19 sutures and she shows me the pictures both before as well as after sutured in a very good job was done in this regard. Unfortunately the skin did not take and necrosis. Subsequently this wound open and she has been having more significant issues with that since. Fortunately there is no evidence of active infection at this time which is good news. No fever chills noted. She has a history of chronic venous stasis but is no longer able to wear any compression in fact she cannot even wear regular socks due to  her neuropathy and the pain that she experiences in her feet if she does. For that reason she tries to elevate her legs as much as she can at nighttime but again she really cannot wear any type of compression. With regard to antibiotic it does not appear that she has been on anything recently. 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 Fortunately there is no evidence of local or systemic infection. No fevers, chills, nausea, vomiting, or diarrhea. 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 10/07/2019 on evaluation today patient presents for follow-up concerning her wound on the left lower extremity. She has been tolerating the dressing changes without complication. With that being said she just got the Santyl 2 days ago. Obviously it has not had enough time to really do what we need to do she would like to obviously give this some time before proceeding with more aggressive debridement or otherwise. 10/21/2019 upon evaluation today patient appears to be doing better with regard to the overall appearance of her wound is not quite as dry I do believe the Santyl has helped her improve to some degree here. Fortunately there is no signs of active infection at this time. I think we may be able to clean off the wound with a little bit of additional debridement today and likely we can transition away from the Santyl to some kind of collagen type dressing. 10/28/2019 upon evaluation today patient actually appears to be doing fairly well with regard to her wound today. She has been tolerating the dressing changes  we did switch to collagen and fortunately that seems to be doing well for her at this point. There does not seem to be any signs of active infection I do feel like the  surface of the wound is looking better at this time as well. 11/04/2019 upon evaluation today patient appears to be doing well with regard to her wound. She is making some progress here and the wound does appear to be measuring smaller which is good news. Overall this is good to be somewhat slow but nonetheless I am encouraged by the fact that the collagen does seem to have helped her. 11/11/2019 on evaluation today patient's wound is showing some signs of improvement. The good news is her overall trend does seem to be towards getting better compared to where she was previous. The smaller wounds along the side actually might even be healed but we can watch this 1 more week before I heal this out. Overall I feel like there is improvement here. She is still using the silver collagen. 11/18/2019 upon evaluation today patient appears to be doing about the same with regard to her wound. This is not significantly smaller there still appears to be a lot of inflammation. For that reason I do want to see about actually applying triamcinolone to the actual wound bed to see if this can be of benefit as well. I thought about this for couple weeks but I think it may be a good thing to proceed with at this point. 11/25/2019 on evaluation today patient appears to be doing well with regard to her lower extremity ulcer. She has been tolerating the dressing changes without complication. Fortunately there is no signs of active infection at this time. No fevers, chills, nausea, vomiting, or diarrhea. 12/02/2019 on evaluation today patient appears to be doing okay with regard to her ulcer on the leg. Fortunately there is no signs of active infection at this time. No fever chills noted she has been tolerating the dressing changes without complication. With that being said overall I am pleased with the fact that there is no signs of infection but at the same time I really feel like she needs more to progress moving week to week  and right now were really not seeing that. We have put in for approval for Apligraf which I think could help to move things along. With that being said we have not heard anything regarding approval at this point. 12/10/2019; patient comes in on my day although have not seen her previously. She has what was a traumatic wound on the left anterior mid tibia. Initially sutured however the area never really healed according to the patient. She has been using Santyl now using Prisma. There is exposed tendon. We have made application for Apligraf apparently this is going through [employer] still has not been approved. She arrives in clinic with erythema around the wound and 4 small circular papules superior to the wound. These are nontender and she is not complaining of any more pain. The exact cause of this is not really clear. The patient has idiopathic peripheral neuropathy which for which she has been extensively evaluated in the past she is not a diabetic she does not have any rheumatologic problems she is aware of. She cannot wear compression because it aggravates her neuropathy 4/15; traumatic wound on the left anterior mid tibia. She arrives today with extensive erythema spreading from the wound medially towards her heel. This is tender swollen and warm compatible with cellulitis.  We are using silver collagen to the wound 4/20; 5-day follow-up. Traumatic wound on the left anterior mid tibia. This is in follow-up for the cellulitis she had on her visit 5 days ago. I gave her empiric cephalexin I did not culture this. She arrives today with the erythema that we marked much improved. She has not been systemically unwell Upon evaluation today patient's wound unfortunately is not doing very well this in fact is somewhat deeper than even last week4/28/2021 which is unfortunate. I am very concerned about the fact that to be honest that the patient does not seem to be doing as good as we would have  expected in the past 2 weeks since I last saw her. Fortunately there is no evidence of active infection systemically which is good news. No fevers, chills, nausea, vomiting, or diarrhea. She has been on Keflex. 01/06/2020 upon evaluation today patient unfortunately appears to still be doing somewhat poorly overall in regard to her ulcer. There is definite signs of infection. She has been on Keflex in April but unfortunately this just does not seem to be doing as well as we would like. I do believe that we will get a need to place her on Augmentin in order to try to get this better. She tested positive for Proteus and E. coli on the culture and is allergic to doxycycline. The patient's wound currently is not looking healthy at all to me. 01/27/2020 upon evaluation today patient appears to be doing maybe slightly better with regard to the wound bed at this point. Her infection is definitely improved which is great news. As far as the Apligraf I do not believe the wound surface is quite clean enough yet in order for [place] to apply the Apligraf. Another potential option would be for a snap VAC to be used under the Curlex and Coban wrap working to try the wrap today. If we can get rid of some of the fluid I think this will help her with healing also think the snap VAC could be beneficial in this regard. Nonetheless again I am not given up on the Apligraf is a possibility I just do not want a waste the applications by putting it on before the wound surface is ready. Potentially the snap VAC can get this wound to the point where it is ready much more quickly. 02/03/2020 upon evaluation today patient appears to be doing excellent with regard to her leg ulcer compared to where things have been. The overall quality of the wound bed is significantly improved and very pleased in this regard. We did get approval for the snap VAC which I am wanting to go ahead and see about initiating at this point. We also have approval  for the Apligraf when and if the time is appropriate for this. With that being said I really think using the snap VAC to try to get this to fill-in would be appropriate to begin with. The patient is in agreement with that plan. I do believe the compression did help her and she states that that was uncomfortable she was able to manage with that for now. 02/10/2020 upon evaluation today patient appears to be doing better with regard to her original and largest wound. 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  I explained that pyoderma is incompletely understood and obviously very difficult sometimes to treat but often steroid therapy is the mainstay of treatment. I think topical steroid treatment is appropriate at this point. She does have mometasone which has been prescribed previously by her primary care provider and she has that with her so we can use that on some of the satellite areas not on the main wound. The patient is in agreement with that plan. 02/17/2020 upon evaluation today patient appears to be doing well with regard to her wound. She is making good progress with the snap VAC actually believe that she is in a good position for [place] to be able to continue the snap VAC but actually at the Apligraf to try to speed up the granulation and epithelial growth. The patient is actually in agreement with that she wants to try to get this healed as quickly as possible I completely agree with her. She did fill up the canister again today. 03/02/2020 upon evaluation today patient appears to be doing well at this time with regard to her wound. In fact this is dramatically improved with the use of the snap VAC and the Apligraf even since the last time I saw her 2 weeks ago. Again week she had a nurse visit and changed  out the snap VAC last week. Overall I think that she is definitely in a much better place and this wound is definitely making progress measuring smaller and overall I think progressing quite well. 7/13; wound surface looks very healthy. She has a small superior satellite lesion as well. Apligraf applied with a snap VAC. 03/30/2020 on evaluation today patient presents for follow-up concerning her lower extremity ulcers. Fortunately there is no signs of active infection at this time which is great news she has been tolerating the dressing changes without complication. Fortunately there is no evidence of anything really worsening the smaller wound more proximal does have some slough noted that is can require sharp debridement today however the main wound for which we have been applying Apligraf and the wound VAC seems to be doing quite well. I am extremely pleased with where things stand today. 04/13/2020 upon evaluation today patient appears to be doing much better in regard to the larger of her 2 wounds. I think we do not even really need the snap VAC any longer at this point. With regard to the smaller wound I think that this is good to be something that will continue to be beneficial as far as using the snap VAC on is concerned. We are going to reapply the Apligraf today. 04/27/2020 on evaluation today patient appears to be doing excellent in regard to both of her wounds. The snap VAC help the smaller area to feeling quite well and overall I think were very close to getting both to close. I believe collagen may be a good option at this point. 05/04/2020 upon evaluation today patient appears to be doing very well in regard to her wounds. Everything is measuring smaller and seems to be doing quite well. Overall very pleased with where things stand and the patient is extremely happy. Both wounds are getting closer and closer to complete closure and the smaller of the 2 is almost completely healed. Overall  very excited for her. 05/11/2020 on evaluation today patient appears to be doing well with regard to her wound. She has been tolerating the dressing changes without complication the Prisma has done excellent for her. The smaller of the 2 does  appear to be completely healed the larger is significantly improved and overall I am very pleased with where things stand today. There is no evidence of active infection at this time. 05/18/2020 on evaluation today patient appears to be doing excellent in regard to her leg ulcer. In fact this is measuring about half the size it was last week. Overall I am extremely pleased. I do believe the collagen is doing a great job. 05/25/2020 on evaluation today patient actually appears to be making excellent progress in regard to her wound overall I feel like she has done extremely well at this point. There is no evidence of active infection and overall I am pleased with the appearance of everything today. The patient is extremely happy. 06/01/2020 upon evaluation today patient appears to actually be doing quite well with regard to her wound at this point. Fortunately I feel like she is pretty much completely healed I do not see anything truly open at this point which is great news. Nonetheless I do believe that the patient still is having some issues here with brand-new skin that we need to let toughen up before we release her. Electronic Signature(s) Signed: 06/01/2020 9:01:42 AM By: Lenda Kelp PA-C Entered By: Lenda Kelp on 06/01/2020 09:01:42 -------------------------------------------------------------------------------- Physical Exam Details Patient Name: Date of Service: [name], [name] 06/01/2020 8:00 A M Medical Record Number: [id number] Patient Account Number: [id number] Date of Birth/Sex: Treating RN: 09/26/1960 (59 y.o. [name] Standard Primary Care Provider: Docia Chuck, [place] Other Clinician: Referring Provider: Treating Provider/Extender: Richardo Priest, [place] Weeks in Treatment: 71 Constitutional Well-nourished and well-hydrated in no acute distress. Respiratory normal breathing without difficulty. Psychiatric this patient is able to make decisions and demonstrates good insight into disease process. Alert and Oriented x 3. pleasant and cooperative. Notes Patient's wound again showed signs of complete epithelization I really feel like this is healed as of today nonetheless I would like to monitor for 1 more week just to ensure especially since his Worker's Comp. I will make sure she is completely and totally done before we discharge. The brand-new skin could reopen therefore I am to be very cautious and careful in this regard. Electronic Signature(s) Signed: 06/01/2020 9:02:15 AM By: Lenda Kelp PA-C Entered By: Lenda Kelp on 06/01/2020 09:02:15 -------------------------------------------------------------------------------- Physician Orders Details Patient Name: Date of Service: Cheyenne Aran B. 06/01/2020 8:00 A M Medical Record Number: [id number] Patient Account Number: [id number] Date of Birth/Sex: Treating RN: December 10, 1960 (59 y.o. 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 Dressing Change Frequency Wound #2 Left,Anterior Lower Leg Other: - 2 times per week Skin Barriers/Peri-Wound Care Moisturizing lotion TCA Cream or Ointment - or menometasone cream to irritated periwound skin Wound Cleansing Wound #2 Left,Anterior Lower Leg May shower with protection. Secondary Dressing Wound #2 Left,Anterior Lower Leg Telfa (non adherent pad) Edema Control Kerlix and Coban - Left Lower Extremity Avoid  standing for long periods of time Elevate legs to the level of the heart or above for 30 minutes daily and/or when sitting, a frequency of: - throughout the day Exercise regularly Electronic Signature(s) Signed: 06/01/2020 5:36:27 PM By: Zenaida DeedBoehlein, Linda RN, BSN Signed: 06/01/2020 5:47:16 PM By: Lenda KelpStone III, Sharnelle Cappelli PA-C Entered By: Zenaida DeedBoehlein, Linda on 06/01/2020 08:57:04 -------------------------------------------------------------------------------- Problem List Details Patient Name: Date of Service: [name], Cheyenne B. 06/01/2020 8:00 A M Medical Record Number: [id number] Patient Account Number: [id number] Date of Birth/Sex: Treating RN: 1961-08-21 (59 y.o. [name] StandardF) Boehlein, Linda Primary Care Provider: Docia ChuckKoirala, [place] Other Clinician: Referring Provider: Treating Provider/Extender: Richardo PriestStone III, Skylan Lara Koir[place], [place] Weeks in Treatment: 7335 Active Problems ICD-10 Encounter Code Description Active Date MDM Diagnosis I87.2 Venous insufficiency (chronic) (peripheral) 09/30/2019 No Yes L97.822 Non-pressure chronic ulcer of other part of left lower leg with fat layer exposed1/27/2021 No Yes L03.116 Cellulitis of left lower limb 12/17/2019 No Yes L97.828 Non-pressure chronic ulcer of other part of left lower leg with other specified 09/30/2019 No Yes severity L97.322 Non-pressure chronic ulcer of left ankle with fat layer exposed 02/03/2020 No Yes I73.00 Raynaud's syndrome without gangrene 09/30/2019 No Yes 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, unspecified 09/30/2019 No  Yes Inactive Problems ICD-10 Code Description Active Date Inactive Date M35.01 Sjogren syndrome with keratoconjunctivitis 09/30/2019 09/30/2019 Resolved Problems Electronic Signature(s) Signed: 06/01/2020 9:00:47 AM By: Lenda KelpStone III, Alcides Nutting PA-C Previous Signature: 06/01/2020 8:09:30 AM Version By: Lenda KelpStone III, Shonn Farruggia PA-C Entered By: Lenda KelpStone III, Shaiann Mcmanamon on 06/01/2020 09:00:47 -------------------------------------------------------------------------------- Progress Note Details Patient Name: Date of Service: [name], Cheyenne B. 06/01/2020 8:00 A M Medical Record Number: [id number] Patient Account Number: [id number] Date of Birth/Sex: Treating RN: 1961-08-21 (59 y.o. [name] StandardF) Boehlein, Linda Primary Care Provider: Docia ChuckKoirala, [place] Other Clinician: Referring Provider: Treating Provider/Extender: Richardo PriestStone III, Ayani Ospina [place], [place] Weeks in Treatment: 3235 Subjective Chief Complaint Information obtained from Patient Left LE Ulcers History of Present Illness (HPI) 09/30/2019 upon evaluation today patient presents for initial inspection here in our clinic concerning issues that she has been experiencing with a wound on her left anterior lower leg. She unfortunately had a significant laceration which required 19 sutures and she shows me the pictures both before as well as after sutured in a very good job was done in this regard. Unfortunately the skin did not take and necrosis. Subsequently this wound open and she has been having more significant issues with that since. Fortunately there is no evidence of active infection at this time which is good news. No fever chills noted. She has a history of chronic venous stasis but is no longer able to wear any compression in fact she cannot even wear regular socks due to her neuropathy and the pain that she experiences in her feet if she does. For that reason she tries to elevate her legs as much as she can at nighttime but again she really cannot wear any type of compression. With  regard to antibiotic it does not appear that she has been on anything recently. 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 Fortunately there is no evidence of local or systemic infection. No fevers, chills, nausea, vomiting, or diarrhea. 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 10/07/2019 on  evaluation today patient presents for follow-up concerning her wound on the left lower extremity. She has been tolerating the dressing changes without complication. With that being said she just got the Santyl 2 days ago. Obviously it has not had enough time to really do what we need to do she would like to obviously give this some time before proceeding with more aggressive debridement or otherwise. 10/21/2019 upon evaluation today patient appears to be doing better with regard to the overall appearance of her wound is not quite as dry I do believe the Santyl has helped her improve to some degree here. Fortunately there is no signs of active infection at this time. I think we may be able to clean off the wound with a little bit of additional debridement today and likely we can transition away from the Santyl to some kind of collagen type dressing. 10/28/2019 upon evaluation today patient actually appears to be doing fairly well with regard to her wound today. She has been tolerating the dressing changes we did switch to collagen and fortunately that seems to be doing well for her at this point. There does not seem to be any signs of active infection I do feel like the surface of the wound is looking better at this time as well. 11/04/2019 upon evaluation today patient appears to be doing well with regard to her wound. She is making some progress here and the wound does appear to  be measuring smaller which is good news. Overall this is good to be somewhat slow but nonetheless I am encouraged by the fact that the collagen does seem to have helped her. 11/11/2019 on evaluation today patient's wound is showing some signs of improvement. The good news is her overall trend does seem to be towards getting better compared to where she was previous. The smaller wounds along the side actually might even be healed but we can watch this 1 more week before I heal this out. Overall I feel like there is improvement here. She is still using the silver collagen. 11/18/2019 upon evaluation today patient appears to be doing about the same with regard to her wound. This is not significantly smaller there still appears to be a lot of inflammation. For that reason I do want to see about actually applying triamcinolone to the actual wound bed to see if this can be of benefit as well. I thought about this for couple weeks but I think it may be a good thing to proceed with at this point. 11/25/2019 on evaluation today patient appears to be doing well with regard to her lower extremity ulcer. She has been tolerating the dressing changes without complication. Fortunately there is no signs of active infection at this time. No fevers, chills, nausea, vomiting, or diarrhea. 12/02/2019 on evaluation today patient appears to be doing okay with regard to her ulcer on the leg. Fortunately there is no signs of active infection at this time. No fever chills noted she has been tolerating the dressing changes without complication. With that being said overall I am pleased with the fact that there is no signs of infection but at the same time I really feel like she needs more to progress moving week to week and right now were really not seeing that. We have put in for approval for Apligraf which I think could help to move things along. With that being said we have not heard anything regarding approval at this  point. 12/10/2019; patient comes in on my  day although have not seen her previously. She has what was a traumatic wound on the left anterior mid tibia. Initially sutured however the area never really healed according to the patient. She has been using Santyl now using Prisma. There is exposed tendon. We have made application for Apligraf apparently this is going through [employer] still has not been approved. She arrives in clinic with erythema around the wound and 4 small circular papules superior to the wound. These are nontender and she is not complaining of any more pain. The exact cause of this is not really clear. The patient has idiopathic peripheral neuropathy which for which she has been extensively evaluated in the past she is not a diabetic she does not have any rheumatologic problems she is aware of. She cannot wear compression because it aggravates her neuropathy 4/15; traumatic wound on the left anterior mid tibia. She arrives today with extensive erythema spreading from the wound medially towards her heel. This is tender swollen and warm compatible with cellulitis. We are using silver collagen to the wound 4/20; 5-day follow-up. Traumatic wound on the left anterior mid tibia. This is in follow-up for the cellulitis she had on her visit 5 days ago. I gave her empiric cephalexin I did not culture this. She arrives today with the erythema that we marked much improved. She has not been systemically unwell Upon evaluation today patient's wound unfortunately is not doing very well this in fact is somewhat deeper than even last week4/28/2021 which is unfortunate. I am very concerned about the fact that to be honest that the patient does not seem to be doing as good as we would have expected in the past 2 weeks since I last saw her. Fortunately there is no evidence of active infection systemically which is good news. No fevers, chills, nausea, vomiting, or diarrhea. She has been on  Keflex. 01/06/2020 upon evaluation today patient unfortunately appears to still be doing somewhat poorly overall in regard to her ulcer. There is definite signs of infection. She has been on Keflex in April but unfortunately this just does not seem to be doing as well as we would like. I do believe that we will get a need to place her on Augmentin in order to try to get this better. She tested positive for Proteus and E. coli on the culture and is allergic to doxycycline. The patient's wound currently is not looking healthy at all to me. 01/27/2020 upon evaluation today patient appears to be doing maybe slightly better with regard to the wound bed at this point. Her infection is definitely improved which is great news. As far as the Apligraf I do not believe the wound surface is quite clean enough yet in order for [place] to apply the Apligraf. Another potential option would be for a snap VAC to be used under the Curlex and Coban wrap working to try the wrap today. If we can get rid of some of the fluid I think this will help her with healing also think the snap VAC could be beneficial in this regard. Nonetheless again I am not given up on the Apligraf is a possibility I just do not want a waste the applications by putting it on before the wound surface is ready. Potentially the snap VAC can get this wound to the point where it is ready much more quickly. 02/03/2020 upon evaluation today patient appears to be doing excellent with regard to her leg ulcer compared to where things have  been. The overall quality of the wound bed is significantly improved and very pleased in this regard. We did get approval for the snap VAC which I am wanting to go ahead and see about initiating at this point. We also have approval for the Apligraf when and if the time is appropriate for this. With that being said I really think using the snap VAC to try to get this to fill-in would be appropriate to begin with. The patient is in  agreement with that plan. I do believe the compression did help her and she states that that was uncomfortable she was able to manage with that for now. 02/10/2020 upon evaluation today patient appears to be doing better with regard to her original and largest wound. 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 I explained that pyoderma is incompletely understood and obviously very difficult sometimes to treat but often steroid therapy is the mainstay of treatment. I think topical steroid treatment is appropriate at this point. She does have mometasone which has been prescribed previously by her primary care provider and she has that with her so we can use that on some of the satellite areas not on the main wound. The patient is in agreement with that plan. 02/17/2020 upon evaluation today patient appears to be doing well with regard to her wound. She is making good progress with the snap VAC actually believe that she is in a good position for [place] to be able to continue the snap VAC but actually at the Apligraf to try to speed up the granulation and epithelial growth. The patient is actually in agreement with that she wants to try to get this healed as quickly as possible I completely agree with her. She did fill up the canister again today. 03/02/2020 upon evaluation today patient appears to be doing well at this time with regard to her wound. In fact this is dramatically improved with the use of the snap VAC and the Apligraf even since the last time I saw her 2 weeks ago. Again week she had a nurse visit and changed out the snap VAC last week. Overall I think that she is definitely in a much better place and this wound is definitely making progress measuring smaller and overall I think progressing quite well. 7/13;  wound surface looks very healthy. She has a small superior satellite lesion as well. Apligraf applied with a snap VAC. 03/30/2020 on evaluation today patient presents for follow-up concerning her lower extremity ulcers. Fortunately there is no signs of active infection at this time which is great news she has been tolerating the dressing changes without complication. Fortunately there is no evidence of anything really worsening the smaller wound more proximal does have some slough noted that is can require sharp debridement today however the main wound for which we have been applying Apligraf and the wound VAC seems to be doing quite well. I am extremely pleased with where things stand today. 04/13/2020 upon evaluation today patient appears to be doing much better in regard to the larger of her 2 wounds. I think we do not even really need the snap VAC any longer at this point. With regard to the smaller wound I think that this is good to be something that will continue  to be beneficial as far as using the snap VAC on is concerned. We are going to reapply the Apligraf today. 04/27/2020 on evaluation today patient appears to be doing excellent in regard to both of her wounds. The snap VAC help the smaller area to feeling quite well and overall I think were very close to getting both to close. I believe collagen may be a good option at this point. 05/04/2020 upon evaluation today patient appears to be doing very well in regard to her wounds. Everything is measuring smaller and seems to be doing quite well. Overall very pleased with where things stand and the patient is extremely happy. Both wounds are getting closer and closer to complete closure and the smaller of the 2 is almost completely healed. Overall very excited for her. 05/11/2020 on evaluation today patient appears to be doing well with regard to her wound. She has been tolerating the dressing changes without complication the Prisma has done excellent  for her. The smaller of the 2 does appear to be completely healed the larger is significantly improved and overall I am very pleased with where things stand today. There is no evidence of active infection at this time. 05/18/2020 on evaluation today patient appears to be doing excellent in regard to her leg ulcer. In fact this is measuring about half the size it was last week. Overall I am extremely pleased. I do believe the collagen is doing a great job. 05/25/2020 on evaluation today patient actually appears to be making excellent progress in regard to her wound overall I feel like she has done extremely well at this point. There is no evidence of active infection and overall I am pleased with the appearance of everything today. The patient is extremely happy. 06/01/2020 upon evaluation today patient appears to actually be doing quite well with regard to her wound at this point. Fortunately I feel like she is pretty much completely healed I do not see anything truly open at this point which is great news. Nonetheless I do believe that the patient still is having some issues here with brand-new skin that we need to let toughen up before we release her. Objective Constitutional Well-nourished and well-hydrated in no acute distress. Vitals Time Taken: 7:10 AM, Height: 66 in, Weight: 156 lbs, BMI: 25.2, Temperature: 98.5 F, Pulse: 73 bpm, Respiratory Rate: 18 breaths/min, Blood Pressure: 116/82 mmHg. Respiratory normal breathing without difficulty. Psychiatric this patient is able to make decisions and demonstrates good insight into disease process. Alert and Oriented x 3. pleasant and cooperative. General Notes: Patient's wound again showed signs of complete epithelization I really feel like this is healed as of today nonetheless I would like to monitor for 1 more week just to ensure especially since his Worker's Comp. I will make sure she is completely and totally done before we discharge. The  brand-new skin could reopen therefore I am to be very cautious and careful in this regard. Integumentary (Hair, Skin) Wound #2 status is Open. Original cause of wound was Trauma. The wound is located on the Left,Anterior Lower Leg. The wound measures 0.6cm length x 0.2cm width x 0.1cm depth; 0.094cm^2 area and 0.009cm^3 volume. There is Fat Layer (Subcutaneous Tissue) exposed. There is no tunneling or undermining noted. There is a small amount of serosanguineous drainage noted. The wound margin is distinct with the outline attached to the wound base. There is large (67- 100%) pink granulation within the wound bed. There is no necrotic tissue within the wound  bed. Assessment Active Problems ICD-10 Venous insufficiency (chronic) (peripheral) Non-pressure chronic ulcer of other part of left lower leg with fat layer exposed Cellulitis of left lower limb Non-pressure chronic ulcer of other part of left lower leg with other specified severity Non-pressure chronic ulcer of left ankle with fat layer exposed Raynaud's syndrome without gangrene Hereditary and idiopathic neuropathy, unspecified Plan Follow-up Appointments: Return Appointment in 1 week. Dressing Change Frequency: Wound #2 Left,Anterior Lower Leg: Other: - 2 times per week Skin Barriers/Peri-Wound Care: Moisturizing lotion TCA Cream or Ointment - or menometasone cream to irritated periwound skin Wound Cleansing: Wound #2 Left,Anterior Lower Leg: May shower with protection. Secondary Dressing: Wound #2 Left,Anterior Lower Leg: T (non adherent pad) elfa Edema Control: Kerlix and Coban - Left Lower Extremity Avoid standing for long periods of time Elevate legs to the level of the heart or above for 30 minutes daily and/or when sitting, a frequency of: - throughout the day Exercise regularly 1. I am going to recommend at this point that we go ahead and continue just with a T nonadherent pad over the wound location which appears  to be healed elfa followed by the Curlex and Coban wrap. 2. I am also can recommend at this time that the patient continue to elevate her legs much as possible for the time being I think that still good. 3. Assuming everything maintains the weight is currently I think she will be ready for discharge next week. We will see patient back for reevaluation in 1 week here in the clinic. If anything worsens or changes patient will contact our office for additional recommendations. Electronic Signature(s) Signed: 06/01/2020 9:02:55 AM By: Lenda Kelp PA-C Entered By: Lenda Kelp on 06/01/2020 09:02:54 -------------------------------------------------------------------------------- SuperBill Details Patient Name: Date of Service: [name] 06/01/2020 Medical Record Number: [id number] Patient Account Number: [id number] Date of Birth/Sex: Treating RN: 01-03-1961 (59 y.o. [name] Standard Primary Care Provider: Docia Chuck, [place] Other Clinician: Referring Provider: Treating Provider/Extender: Richardo Priest, [place] Weeks in Treatment: 35 Diagnosis Coding ICD-10 Codes Code Description I87.2 Venous insufficiency (chronic) (peripheral) L97.822 Non-pressure chronic ulcer of other part of left lower leg with fat layer exposed L03.116 Cellulitis of left lower limb L97.828 Non-pressure chronic ulcer of other part of left lower leg with other specified severity L97.322 Non-pressure chronic ulcer of left ankle with fat layer exposed I73.00 Raynaud's syndrome without gangrene 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, unspecified Facility Procedures CPT4 Code: [id number] Description: 99213 - WOUND CARE VISIT-LEV 3 EST PT Modifier: Quantity: 1 Physician Procedures : CPT4 Code Description Modifier [id number] - WC PHYS LEVEL 3 - EST PT ICD-10 Diagnosis Description I87.2 Venous insufficiency (chronic) (peripheral) L97.822 Non-pressure chronic ulcer of other part of left lower leg  with fat layer exposed L03.116  Cellulitis of left lower limb L97.828 Non-pressure chronic ulcer of other part of left lower leg with other specified severity Quantity: 1 Electronic Signature(s) Signed: 06/01/2020 9:03:14 AM By: Lenda Kelp PA-C Previous Signature: 06/01/2020 9:00:55 AM Version By: Lenda Kelp PA-C Entered By: Lenda Kelp on 06/01/2020 09:03:14

## 2020-06-03 NOTE — Progress Notes (Signed)
[name], [name] ([id number]) Visit Report for 06/01/2020 Arrival Information Details Patient Name: Date of Service: [name], [name] 06/01/2020 8:00 A M Medical Record Number: [id number] Patient Account Number: [id number] Date of Birth/Sex: Treating RN: [birth year] (59 y.o. [name] Primary Care Aylssa Herrig: [name], [place] Other Clinician: Referring Eiza Canniff: Treating Ashleyann Shoun/Extender: Dayna Ramus, [place] Weeks in Treatment: 66 Visit Information History Since Last Visit Added or deleted any medications: No Patient Arrived: Ambulatory Any new allergies or adverse reactions: No Arrival Time: 08:10 Had a fall or experienced change in No Accompanied By: self activities of daily living that may affect Transfer Assistance: None risk of falls: Patient Identification Verified: Yes Signs or symptoms of abuse/neglect since last visito No Secondary Verification Process Completed: Yes Hospitalized since last visit: No Patient Requires Transmission-Based Precautions: No Implantable device outside of the clinic excluding No Patient Has Alerts: Yes cellular tissue based products placed in the center since last visit: Has Dressing in Place as Prescribed: Yes Has Compression in Place as Prescribed: No Pain Present Now: No Electronic Signature(s) Signed: 06/01/2020 5:13:43 PM By: Deon Pilling Entered By: Deon Pilling on 06/01/2020 08:14:54 -------------------------------------------------------------------------------- Clinic Level of Care Assessment Details Patient Name: Date of Service: [name] 06/01/2020 8:00 A M Medical Record Number: [id number] Patient Account Number: [id number] Date of Birth/Sex: Treating RN: 16-Oct-1960 (59 y.o. [name] Primary Care Brandom Kerwin: [name], [place] Other Clinician: Referring Natale Thoma: Treating Chelsee Hosie/Extender: Dayna Ramus, [place] Weeks in Treatment: 35 Clinic Level of Care Assessment Items TOOL 4 Quantity  Score _0  - 0 Use when only an EandM is performed on FOLLOW-UP visit ASSESSMENTS - Nursing Assessment / Reassessment X- 1 10 Reassessment of Co-morbidities (includes updates in patient status) X- 1 5 Reassessment of Adherence to Treatment Plan ASSESSMENTS - Wound and Skin A ssessment / Reassessment X - Simple Wound Assessment / Reassessment - one wound 1 5 _1  - 0 Complex Wound Assessment / Reassessment - multiple wounds _2  - 0 Dermatologic / Skin Assessment (not related to wound area) ASSESSMENTS - Focused Assessment X- 1 5 Circumferential Edema Measurements - multi extremities _3  - 0 Nutritional Assessment / Counseling / Intervention X- 1 5 Lower Extremity Assessment (monofilament, tuning fork, pulses) _4  - 0 Peripheral Arterial Disease Assessment (using hand held doppler) ASSESSMENTS - Ostomy and/or Continence Assessment and Care _5  - 0 Incontinence Assessment and Management _6  - 0 Ostomy Care Assessment and Management (repouching, etc.) PROCESS - Coordination of Care _7  - 0 Simple Patient / Family Education for ongoing care _8  - 0 Complex (extensive) Patient / Family Education for ongoing care X- 1 10 Staff obtains Programmer, systems, Records, T Results / Process Orders est _9  - 0 Staff telephones HHA, Nursing Homes / Clarify orders / etc _10  - 0 Routine Transfer to another Facility (non-emergent condition) _11  - 0 Routine Hospital Admission (non-emergent condition) _12  - 0 New Admissions / Biomedical engineer / Ordering NPWT Apligraf, etc. , _13  - 0 Emergency Hospital Admission (emergent condition) X- 1 10 Simple Discharge Coordination _14  - 0 Complex (extensive) Discharge Coordination PROCESS - Special Needs _15  - 0 Pediatric / Minor Patient Management _16  - 0 Isolation Patient Management _17  - 0 Hearing / Language / Visual special needs _18  - 0 Assessment of Community assistance (transportation, D/C planning, etc.) _19  - 0 Additional assistance / Altered  mentation _20  - 0 Support Surface(s) Assessment (bed, cushion, seat, etc.) INTERVENTIONS - Wound Cleansing / Measurement X - Simple Wound Cleansing - one wound 1 5 _21  - 0  Complex Wound Cleansing - multiple wounds X- 1 5 Wound Imaging (photographs - any number of wounds) _0  - 0 Wound Tracing (instead of photographs) X- 1 5 Simple Wound Measurement - one wound _1  - 0 Complex Wound Measurement - multiple wounds INTERVENTIONS - Wound Dressings X - Small Wound Dressing one or multiple wounds 1 10 _2  - 0 Medium Wound Dressing one or multiple wounds _3  - 0 Large Wound Dressing one or multiple wounds <WIOXBDZHGDJMEQAS>_3<\/MHDQQIWLNLGXQJJH>_4  - 0 Application of Medications - topical <RDEYCXKGYJEHUDJS>_9<\/FWYOVZCHYIFOYDXA>_1  - 0 Application of Medications - injection INTERVENTIONS - Miscellaneous _6  - 0 External ear exam _7  - 0 Specimen Collection (cultures, biopsies, blood, body fluids, etc.) _8  - 0 Specimen(s) / Culture(s) sent or taken to Lab for analysis _9  - 0 Patient Transfer (multiple staff / Civil Service fast streamer / Similar devices) _10  - 0 Simple Staple / Suture removal (25 or less) _11  - 0 Complex Staple / Suture removal (26 or more) _12  - 0 Hypo / Hyperglycemic Management (close monitor of Blood Glucose) _13  - 0 Ankle / Brachial Index (ABI) - do not check if billed separately X- 1 5 Vital Signs Has the patient been seen at the hospital within the last three years: Yes Total Score: 80 Level Of Care: New/Established - Level 3 Electronic Signature(s) Signed: 06/01/2020 5:36:27 PM By: Baruch Gouty RN, BSN Entered By: Baruch Gouty on 06/01/2020 08:56:02 -------------------------------------------------------------------------------- Encounter Discharge Information Details Patient Name: Date of Service: Cheyenne Mccallum B. 06/01/2020 8:00 A M Medical Record Number: [id number] Patient Account Number: [id number] Date of Birth/Sex: Treating RN: 1961-02-19 (59 y.o. [name] Primary Care Shuntel Fishburn: [name], [place] Other Clinician: Referring  Suhailah Kwan: Treating Shalla Bulluck/Extender: Dayna Ramus, [place] Weeks in Treatment: 41 Encounter Discharge Information Items Discharge Condition: Stable Ambulatory Status: Ambulatory Discharge Destination: Home Transportation: Private Auto Accompanied By: self Schedule Follow-up Appointment: Yes Clinical Summary of Care: Patient Declined Electronic Signature(s) Signed: 06/03/2020 4:20:16 PM By: Carlene Coria RN Entered By: Carlene Coria on 06/01/2020 09:10:34 -------------------------------------------------------------------------------- Lower Extremity Assessment Details Patient Name: Date of Service: [name], [name] 06/01/2020 8:00 A M Medical Record Number: [id number] Patient Account Number: [id number] Date of Birth/Sex: Treating RN: 1961/04/01 (59 y.o. [name] Primary Care Olivia Pavelko: [name], [place] Other Clinician: Referring Dolton Shaker: Treating Idriss Quackenbush/Extender: Dayna Ramus, [place] Weeks in Treatment: 35 Edema Assessment Assessed: [Left: Yes] [Right: No] Edema: [Left: N] [Right: o] Calf Left: Right: Point of Measurement: 29 cm From Medial Instep 32.5 cm Ankle Left: Right: Point of Measurement: 8 cm From Medial Instep 21 cm Vascular Assessment Pulses: Dorsalis Pedis Palpable: [Left:Yes] Electronic Signature(s) Signed: 06/01/2020 5:13:43 PM By: Deon Pilling Entered By: Deon Pilling on 06/01/2020 08:15:34 -------------------------------------------------------------------------------- [place] Details Patient Name: Date of Service: Cheyenne Mccallum B. 06/01/2020 8:00 A M Medical Record Number: [id number] Patient Account Number: [id number] Date of Birth/Sex: Treating RN: 12/07/1960 (59 y.o. [name] Primary Care Selma Mink: [name], [place] Other Clinician: Referring Laniqua Torrens: Treating Khari Lett/Extender: Dayna Ramus, [place] Weeks in Treatment: 87 Active Inactive Venous Leg Ulcer Nursing  Diagnoses: Knowledge deficit related to disease process and management Potential for venous Insuffiency (use before diagnosis confirmed) Goals: Patient will maintain optimal edema control Date Initiated: 09/30/2019 Target Resolution Date: 06/22/2020 Goal Status: Active Patient/caregiver will verbalize understanding of disease process and disease management Date Initiated: 09/30/2019 Date Inactivated: 11/25/2019 Target Resolution Date: 11/25/2019 Goal Status: Met Interventions: Assess peripheral edema status every visit. Compression as ordered Provide education on venous insufficiency Treatment Activities: Therapeutic compression applied : 09/30/2019 Notes: Wound/Skin  Impairment Nursing Diagnoses: Impaired tissue integrity Knowledge deficit related to ulceration/compromised skin integrity Goals: Patient/caregiver will verbalize understanding of skin care regimen Date Initiated: 09/30/2019 Target Resolution Date: 06/22/2020 Goal Status: Active Ulcer/skin breakdown will have a volume reduction of 30% by week 4 Date Initiated: 09/30/2019 Date Inactivated: 10/28/2019 Target Resolution Date: 10/28/2019 Goal Status: Unmet Unmet Reason: other comorbidities Interventions: Assess patient/caregiver ability to obtain necessary supplies Assess patient/caregiver ability to perform ulcer/skin care regimen upon admission and as needed Assess ulceration(s) every visit Provide education on ulcer and skin care Treatment Activities: Skin care regimen initiated : 09/30/2019 Topical wound management initiated : 09/30/2019 Notes: Electronic Signature(s) Signed: 06/01/2020 5:36:27 PM By: Baruch Gouty RN, BSN Entered By: Baruch Gouty on 06/01/2020 08:55:09 -------------------------------------------------------------------------------- Pain Assessment Details Patient Name: Date of Service: [name] 06/01/2020 8:00 A M Medical Record Number: [id number] Patient Account Number:  [id number] Date of Birth/Sex: Treating RN: 01/02/1961 (59 y.o. [name] Primary Care Joydan Gretzinger: [name], [place] Other Clinician: Referring Lief Palmatier: Treating Windell Musson/Extender: Dayna Ramus, [place] Weeks in Treatment: 9 Active Problems Location of Pain Severity and Description of Pain Patient Has Paino No Site Locations Rate the pain. Current Pain Level: 0 Pain Management and Medication Current Pain Management: Medication: No Cold Application: No Rest: No Massage: No Activity: No T.E.N.S.: No Heat Application: No Leg drop or elevation: No Is the Current Pain Management Adequate: Adequate How does your wound impact your activities of daily livingo Sleep: No Bathing: No Appetite: No Relationship With Others: No Bladder Continence: No Emotions: No Bowel Continence: No Work: No Toileting: No Drive: No Dressing: No Hobbies: No Electronic Signature(s) Signed: 06/01/2020 5:13:43 PM By: Deon Pilling Entered By: Deon Pilling on 06/01/2020 08:15:25 -------------------------------------------------------------------------------- Patient/Caregiver Education Details Patient Name: Date of Service: [name], Cheyenne B. 9/29/2021andnbsp8:00 A M Medical Record Number: [id number] Patient Account Number: [id number] Date of Birth/Gender: Treating RN: May 07, 1961 (59 y.o. [name] Primary Care Physician: [name], [place] Other Clinician: Referring Physician: Treating Physician/Extender: Dayna Ramus, [place] Weeks in Treatment: 89 Education Assessment Education Provided To: Patient Education Topics Provided Venous: Methods: Explain/Verbal Responses: Reinforcements needed, State content correctly Wound/Skin Impairment: Methods: Explain/Verbal Responses: Reinforcements needed, State content correctly Electronic Signature(s) Signed: 06/01/2020 5:36:27 PM By: Baruch Gouty RN, BSN Entered By: Baruch Gouty on 06/01/2020  08:55:28 -------------------------------------------------------------------------------- Wound Assessment Details Patient Name: Date of Service: [name] 06/01/2020 8:00 A M Medical Record Number: [id number] Patient Account Number: [id number] Date of Birth/Sex: Treating RN: [birth year] (59 y.o. [name] Primary Care Jannah Guardiola: [name], [place] Other Clinician: Referring Jolyne Laye: Treating Chimaobi Casebolt/Extender: Dayna Ramus, [place] Weeks in Treatment: 35 Wound Status Wound Number: 2 Primary Etiology: Venous Leg Ulcer Wound Location: Left, Anterior Lower Leg Wound Status: Open Wounding Event: Trauma Comorbid History: Lymphedema, Raynauds, Neuropathy Date Acquired: 08/06/2019 Weeks Of Treatment: 35 Clustered Wound: No Wound Measurements Length: (cm) 0.6 Width: (cm) 0.2 Depth: (cm) 0.1 Area: (cm) 0.094 Volume: (cm) 0.009 % Reduction in Area: 99.5% % Reduction in Volume: 99.8% Epithelialization: Large (67-100%) Tunneling: No Undermining: No Wound Description Classification: Full Thickness With Exposed Support Structures Wound Margin: Distinct, outline attached Exudate Amount: Small Exudate Type: Serosanguineous Exudate Color: red, brown Wound Bed Granulation Amount: Large (67-100%) Granulation Quality: Pink Necrotic Amount: None Present (0%) Foul Odor After Cleansing: No Slough/Fibrino No Exposed Structure Fascia Exposed: No Fat Layer (Subcutaneous Tissue) Exposed: Yes Tendon Exposed: No Muscle Exposed: No Joint Exposed: No Bone Exposed: No Treatment Notes Wound #2 (Left, Anterior Lower Leg) 1. Cleanse With  Wound Cleanser Soap and water 2. Periwound Care TCA Cream 3. Primary Dressing Applied Other primary dressing (specifiy in notes) 6. Support Layer Applied Kerlix/Coban Notes Probation officer) Signed: 06/01/2020 5:13:43 PM By: Deon Pilling Entered By: Deon Pilling on 06/01/2020  08:15:55 -------------------------------------------------------------------------------- Vitals Details Patient Name: Date of Service: Cheyenne Mccallum B. 06/01/2020 8:00 A M Medical Record Number: [id number] Patient Account Number: [id number] Date of Birth/Sex: Treating RN: 1961/08/16 (59 y.o. [name] Primary Care Atzin Buchta: [name], [place] Other Clinician: Referring Latravis Grine: Treating Mariaha Ellington/Extender: Dayna Ramus, [place] Weeks in Treatment: 35 Vital Signs Time Taken: 07:10 Temperature (F): 98.5 Height (in): 66 Pulse (bpm): 73 Weight (lbs): 156 Respiratory Rate (breaths/min): 18 Body Mass Index (BMI): 25.2 Blood Pressure (mmHg): 116/82 Reference Range: 80 - 120 mg / dl Electronic Signature(s) Signed: 06/01/2020 5:13:43 PM By: Deon Pilling Entered By: Deon Pilling on 06/01/2020 08:15:14

## 2020-06-08 ENCOUNTER — Encounter (HOSPITAL_BASED_OUTPATIENT_CLINIC_OR_DEPARTMENT_OTHER): Payer: Worker's Compensation | Attending: Physician Assistant | Admitting: Physician Assistant

## 2020-06-08 DIAGNOSIS — L97828 Non-pressure chronic ulcer of other part of left lower leg with other specified severity: Secondary | ICD-10-CM | POA: Diagnosis not present

## 2020-06-08 DIAGNOSIS — L97822 Non-pressure chronic ulcer of other part of left lower leg with fat layer exposed: Secondary | ICD-10-CM | POA: Insufficient documentation

## 2020-06-08 DIAGNOSIS — L97322 Non-pressure chronic ulcer of left ankle with fat layer exposed: Secondary | ICD-10-CM | POA: Insufficient documentation

## 2020-06-08 DIAGNOSIS — G609 Hereditary and idiopathic neuropathy, unspecified: Secondary | ICD-10-CM | POA: Diagnosis not present

## 2020-06-08 DIAGNOSIS — L03116 Cellulitis of left lower limb: Secondary | ICD-10-CM | POA: Diagnosis not present

## 2020-06-08 DIAGNOSIS — I872 Venous insufficiency (chronic) (peripheral): Secondary | ICD-10-CM | POA: Diagnosis not present

## 2020-06-08 DIAGNOSIS — I7300 Raynaud's syndrome without gangrene: Secondary | ICD-10-CM | POA: Diagnosis not present

## 2020-06-08 NOTE — Progress Notes (Addendum)
HAYLIN, CAMILLI ([id number]) Visit Report for 06/08/2020 Chief Complaint Document Details Patient Name: Date of Service: [name], [name] 06/08/2020 8:00 A M Medical Record Number: [id number] Patient Account Number: [id number] Date of Birth/Sex: Treating RN: [birth year] (59 y.o. [name] Standard Primary Care Provider: Docia Chuck, [place] Other Clinician: Referring Provider: Treating Provider/Extender: Richardo Priest, [place] Weeks in Treatment: 89 Information Obtained from: Patient Chief Complaint Left LE Ulcers Electronic Signature(s) Signed: 06/08/2020 8:28:56 AM By: Lenda Kelp PA-C Entered By: Lenda Kelp on 06/08/2020 08:28:56 -------------------------------------------------------------------------------- HPI Details Patient Name: Date of Service: Cheyenne Aran B. 06/08/2020 8:00 A M Medical Record Number: [id number] Patient Account Number: [id number] Date of Birth/Sex: Treating RN: 1961-03-04 (59 y.o. [name] Standard Primary Care Provider: Docia Chuck, [place] Other Clinician: Referring Provider: Treating Provider/Extender: Richardo Priest, [place] Weeks in Treatment: 89 History of Present Illness HPI Description: 09/30/2019 upon evaluation today patient presents for initial inspection here in our clinic concerning issues that she has been experiencing with a wound on her left anterior lower leg. She unfortunately had a significant laceration which required 19 sutures and she shows me the pictures both before as well as after sutured in a very good job was done in this regard. Unfortunately the skin did not take and necrosis. Subsequently this wound open and she has been having more significant issues with that since. Fortunately there is no evidence of active infection at this time which is good news. No fever chills noted. She has a history of chronic venous stasis but is no longer able to wear any compression in fact she cannot even wear regular socks due to  her neuropathy and the pain that she experiences in her feet if she does. For that reason she tries to elevate her legs as much as she can at nighttime but again she really cannot wear any type of compression. With regard to antibiotic it does not appear that she has been on anything recently. 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 Fortunately there is no evidence of local or systemic infection. No fevers, chills, nausea, vomiting, or diarrhea. 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 10/07/2019 on evaluation today patient presents for follow-up concerning her wound on the left lower extremity. She has been tolerating the dressing changes without complication. With that being said she just got the Santyl 2 days ago. Obviously it has not had enough time to really do what we need to do she would like to obviously give this some time before proceeding with more aggressive debridement or otherwise. 10/21/2019 upon evaluation today patient appears to be doing better with regard to the overall appearance of her wound is not quite as dry I do believe the Santyl has helped her improve to some degree here. Fortunately there is no signs of active infection at this time. I think we may be able to clean off the wound with a little bit of additional debridement today and likely we can transition away from the Santyl to some kind of collagen type dressing. 10/28/2019 upon evaluation today patient actually appears to be doing fairly well with regard to her wound today. She has been tolerating the dressing changes  we did switch to collagen and fortunately that seems to be doing well for her at this point. There does not seem to be any signs of active infection I do feel like the  surface of the wound is looking better at this time as well. 11/04/2019 upon evaluation today patient appears to be doing well with regard to her wound. She is making some progress here and the wound does appear to be measuring smaller which is good news. Overall this is good to be somewhat slow but nonetheless I am encouraged by the fact that the collagen does seem to have helped her. 11/11/2019 on evaluation today patient's wound is showing some signs of improvement. The good news is her overall trend does seem to be towards getting better compared to where she was previous. The smaller wounds along the side actually might even be healed but we can watch this 1 more week before I heal this out. Overall I feel like there is improvement here. She is still using the silver collagen. 11/18/2019 upon evaluation today patient appears to be doing about the same with regard to her wound. This is not significantly smaller there still appears to be a lot of inflammation. For that reason I do want to see about actually applying triamcinolone to the actual wound bed to see if this can be of benefit as well. I thought about this for couple weeks but I think it may be a good thing to proceed with at this point. 11/25/2019 on evaluation today patient appears to be doing well with regard to her lower extremity ulcer. She has been tolerating the dressing changes without complication. Fortunately there is no signs of active infection at this time. No fevers, chills, nausea, vomiting, or diarrhea. 12/02/2019 on evaluation today patient appears to be doing okay with regard to her ulcer on the leg. Fortunately there is no signs of active infection at this time. No fever chills noted she has been tolerating the dressing changes without complication. With that being said overall I am pleased with the fact that there is no signs of infection but at the same time I really feel like she needs more to progress moving week to week  and right now were really not seeing that. We have put in for approval for Apligraf which I think could help to move things along. With that being said we have not heard anything regarding approval at this point. 12/10/2019; patient comes in on my day although have not seen her previously. She has what was a traumatic wound on the left anterior mid tibia. Initially sutured however the area never really healed according to the patient. She has been using Santyl now using Prisma. There is exposed tendon. We have made application for Apligraf apparently this is going through [employer] still has not been approved. She arrives in clinic with erythema around the wound and 4 small circular papules superior to the wound. These are nontender and she is not complaining of any more pain. The exact cause of this is not really clear. The patient has idiopathic peripheral neuropathy which for which she has been extensively evaluated in the past she is not a diabetic she does not have any rheumatologic problems she is aware of. She cannot wear compression because it aggravates her neuropathy 4/15; traumatic wound on the left anterior mid tibia. She arrives today with extensive erythema spreading from the wound medially towards her heel. This is tender swollen and warm compatible with cellulitis.  We are using silver collagen to the wound 4/20; 5-day follow-up. Traumatic wound on the left anterior mid tibia. This is in follow-up for the cellulitis she had on her visit 5 days ago. I gave her empiric cephalexin I did not culture this. She arrives today with the erythema that we marked much improved. She has not been systemically unwell Upon evaluation today patient's wound unfortunately is not doing very well this in fact is somewhat deeper than even last week4/28/2021 which is unfortunate. I am very concerned about the fact that to be honest that the patient does not seem to be doing as good as we would have  expected in the past 2 weeks since I last saw her. Fortunately there is no evidence of active infection systemically which is good news. No fevers, chills, nausea, vomiting, or diarrhea. She has been on Keflex. 01/06/2020 upon evaluation today patient unfortunately appears to still be doing somewhat poorly overall in regard to her ulcer. There is definite signs of infection. She has been on Keflex in April but unfortunately this just does not seem to be doing as well as we would like. I do believe that we will get a need to place her on Augmentin in order to try to get this better. She tested positive for Proteus and E. coli on the culture and is allergic to doxycycline. The patient's wound currently is not looking healthy at all to me. 01/27/2020 upon evaluation today patient appears to be doing maybe slightly better with regard to the wound bed at this point. Her infection is definitely improved which is great news. As far as the Apligraf I do not believe the wound surface is quite clean enough yet in order for [place] to apply the Apligraf. Another potential option would be for a snap VAC to be used under the Curlex and Coban wrap working to try the wrap today. If we can get rid of some of the fluid I think this will help her with healing also think the snap VAC could be beneficial in this regard. Nonetheless again I am not given up on the Apligraf is a possibility I just do not want a waste the applications by putting it on before the wound surface is ready. Potentially the snap VAC can get this wound to the point where it is ready much more quickly. 02/03/2020 upon evaluation today patient appears to be doing excellent with regard to her leg ulcer compared to where things have been. The overall quality of the wound bed is significantly improved and very pleased in this regard. We did get approval for the snap VAC which I am wanting to go ahead and see about initiating at this point. We also have approval  for the Apligraf when and if the time is appropriate for this. With that being said I really think using the snap VAC to try to get this to fill-in would be appropriate to begin with. The patient is in agreement with that plan. I do believe the compression did help her and she states that that was uncomfortable she was able to manage with that for now. 02/10/2020 upon evaluation today patient appears to be doing better with regard to her original and largest wound. 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  I explained that pyoderma is incompletely understood and obviously very difficult sometimes to treat but often steroid therapy is the mainstay of treatment. I think topical steroid treatment is appropriate at this point. She does have mometasone which has been prescribed previously by her primary care provider and she has that with her so we can use that on some of the satellite areas not on the main wound. The patient is in agreement with that plan. 02/17/2020 upon evaluation today patient appears to be doing well with regard to her wound. She is making good progress with the snap VAC actually believe that she is in a good position for [place] to be able to continue the snap VAC but actually at the Apligraf to try to speed up the granulation and epithelial growth. The patient is actually in agreement with that she wants to try to get this healed as quickly as possible I completely agree with her. She did fill up the canister again today. 03/02/2020 upon evaluation today patient appears to be doing well at this time with regard to her wound. In fact this is dramatically improved with the use of the snap VAC and the Apligraf even since the last time I saw her 2 weeks ago. Again week she had a nurse visit and changed  out the snap VAC last week. Overall I think that she is definitely in a much better place and this wound is definitely making progress measuring smaller and overall I think progressing quite well. 7/13; wound surface looks very healthy. She has a small superior satellite lesion as well. Apligraf applied with a snap VAC. 03/30/2020 on evaluation today patient presents for follow-up concerning her lower extremity ulcers. Fortunately there is no signs of active infection at this time which is great news she has been tolerating the dressing changes without complication. Fortunately there is no evidence of anything really worsening the smaller wound more proximal does have some slough noted that is can require sharp debridement today however the main wound for which we have been applying Apligraf and the wound VAC seems to be doing quite well. I am extremely pleased with where things stand today. 04/13/2020 upon evaluation today patient appears to be doing much better in regard to the larger of her 2 wounds. I think we do not even really need the snap VAC any longer at this point. With regard to the smaller wound I think that this is good to be something that will continue to be beneficial as far as using the snap VAC on is concerned. We are going to reapply the Apligraf today. 04/27/2020 on evaluation today patient appears to be doing excellent in regard to both of her wounds. The snap VAC help the smaller area to feeling quite well and overall I think were very close to getting both to close. I believe collagen may be a good option at this point. 05/04/2020 upon evaluation today patient appears to be doing very well in regard to her wounds. Everything is measuring smaller and seems to be doing quite well. Overall very pleased with where things stand and the patient is extremely happy. Both wounds are getting closer and closer to complete closure and the smaller of the 2 is almost completely healed. Overall  very excited for her. 05/11/2020 on evaluation today patient appears to be doing well with regard to her wound. She has been tolerating the dressing changes without complication the Prisma has done excellent for her. The smaller of the 2 does  appear to be completely healed the larger is significantly improved and overall I am very pleased with where things stand today. There is no evidence of active infection at this time. 05/18/2020 on evaluation today patient appears to be doing excellent in regard to her leg ulcer. In fact this is measuring about half the size it was last week. Overall I am extremely pleased. I do believe the collagen is doing a great job. 05/25/2020 on evaluation today patient actually appears to be making excellent progress in regard to her wound overall I feel like she has done extremely well at this point. There is no evidence of active infection and overall I am pleased with the appearance of everything today. The patient is extremely happy. 06/01/2020 upon evaluation today patient appears to actually be doing quite well with regard to her wound at this point. Fortunately I feel like she is pretty much completely healed I do not see anything truly open at this point which is great news. Nonetheless I do believe that the patient still is having some issues here with brand-new skin that we need to let toughen up before we release her. 06/08/2020 upon evaluation today patient appears to be doing very well with regard to her wound in fact this appears to be completely healed which is great news she is very pleased to see this. Electronic Signature(s) Signed: 06/08/2020 8:34:24 AM By: Lenda Kelp PA-C Entered By: Lenda Kelp on 06/08/2020 08:34:24 -------------------------------------------------------------------------------- Physical Exam Details Patient Name: Date of Service: [name], [name] 06/08/2020 8:00 A M Medical Record Number: [id number] Patient Account Number:  [id number] Date of Birth/Sex: Treating RN: 26-Mar-1961 (59 y.o. [name] Standard Primary Care Provider: Docia Chuck, [place] Other Clinician: Referring Provider: Treating Provider/Extender: Richardo Priest, [place] Weeks in Treatment: 52 Constitutional Well-nourished and well-hydrated in no acute distress. Respiratory normal breathing without difficulty. Psychiatric this patient is able to make decisions and demonstrates good insight into disease process. Alert and Oriented x 3. pleasant and cooperative. Notes His wound again showed signs of complete epithelization there is no evidence of active infection and no signs of anything worsening at this point. In general I am extremely pleased with where we stand. Electronic Signature(s) Signed: 06/08/2020 8:34:37 AM By: Lenda Kelp PA-C Entered By: Lenda Kelp on 06/08/2020 08:34:36 -------------------------------------------------------------------------------- Physician Orders Details Patient Name: Date of Service: Cheyenne Aran B. 06/08/2020 8:00 A M Medical Record Number: [id number] Patient Account Number: [id number] Date of Birth/Sex: Treating RN: Sep 21, 1960 (59 y.o. [name] Standard Primary Care Provider: Docia Chuck, [place] Other Clinician: Referring Provider: Treating Provider/Extender: Richardo Priest, [place] Weeks in Treatment: 2 Verbal / Phone Orders: No Diagnosis Coding ICD-10 Coding Code Description I87.2 Venous insufficiency (chronic) (peripheral) L97.822 Non-pressure chronic ulcer of other part of left lower leg with fat layer exposed L03.116 Cellulitis of left lower limb L97.828 Non-pressure chronic ulcer of other part of left lower leg with other specified severity L97.322 Non-pressure chronic ulcer of left ankle with fat layer exposed I73.00 Raynaud's syndrome without gangrene 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, unspecified [facility]charge From [facility] Discharge from Wound Care Center Skin  Barriers/Peri-Wound Care Moisturizing lotion - to legs daily Edema Control Avoid standing for long periods of time Elevate legs to the level of the heart or above for 30 minutes daily and/or when sitting, a frequency of: Exercise regularly Electronic Signature(s) Signed: 06/08/2020 6:14:29 PM By: Zenaida Deed RN, BSN Signed: 06/08/2020 6:38:49 PM By: Lenda Kelp PA-C Entered By: Daneil Dan,  Linda on 06/08/2020 08:31:29 -------------------------------------------------------------------------------- Problem List Details Patient Name: Date of Service: Cheyenne LemonsSMITHEY, Rashelle B. 06/08/2020 8:00 A M Medical Record Number: [id number] Patient Account Number: [id number] Date of Birth/Sex: Treating RN: 27-Dec-1960 (59 y.o. [name] StandardF) Boehlein, Linda Primary Care Provider: Docia ChuckKoirala, [place] Other Clinician: Referring Provider: Treating Provider/Extender: Richardo PriestStone III, Dvora Buitron [place], [place] Weeks in Treatment: 8436 Active Problems ICD-10 Encounter Code Description Active Date MDM Diagnosis I87.2 Venous insufficiency (chronic) (peripheral) 09/30/2019 No Yes L97.822 Non-pressure chronic ulcer of other part of left lower leg with fat layer exposed1/27/2021 No Yes L03.116 Cellulitis of left lower limb 12/17/2019 No Yes L97.828 Non-pressure chronic ulcer of other part of left lower leg with other specified 09/30/2019 No Yes severity L97.322 Non-pressure chronic ulcer of left ankle with fat layer exposed 02/03/2020 No Yes I73.00 Raynaud's syndrome without gangrene 09/30/2019 No Yes 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 06/08/2020 8:00 A M Medical Record Number: [id number] Patient Account Number: [id number] Date of Birth/Sex: Treating RN: 27-Dec-1960 (59 y.o. [name] StandardF) Boehlein, Linda Primary Care Provider: Docia ChuckKoirala, [place] Other Clinician: Referring Provider: Treating Provider/Extender: Richardo PriestStone III, Sharine Cadle [place], [place] Weeks in Treatment: 736 Subjective Chief Complaint Information obtained from Patient Left LE Ulcers History of Present Illness (HPI) 09/30/2019 upon evaluation today patient presents for initial inspection here in our clinic concerning issues that she has been experiencing with a wound on her left anterior lower leg. She unfortunately had a significant laceration which required 19 sutures and she shows me the pictures both before as well as after sutured in a very good job was done in this regard. Unfortunately the skin did not take and necrosis. Subsequently this wound open and she has been having more significant issues with that since. Fortunately there is no evidence of active infection at this time which is good news. No fever chills noted. She has a history of chronic venous stasis but is no longer able to wear any compression in fact she cannot even wear regular socks due to her neuropathy and the pain that she experiences in her feet if she does. For that reason she tries to elevate her legs as much as she can at nighttime but again she really cannot wear any type of compression. With regard to antibiotic it does not appear that she has been on anything recently. 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 Fortunately there is no evidence  of local or systemic infection. No fevers, chills, nausea, vomiting, or diarrhea. 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 10/07/2019 on evaluation today patient presents for follow-up concerning her wound on the left lower extremity. She has been tolerating the dressing changes without complication. With that being said she just got the Santyl 2 days ago. Obviously it has not had enough time to really do what we need to do she would like to obviously give this some time before proceeding with more aggressive debridement or  otherwise. 10/21/2019 upon evaluation today patient appears to be doing better with regard to the overall appearance of her wound is not quite as dry I do believe the Santyl has helped her improve to some degree here. Fortunately there is no signs of active infection at this time. I think we may be able to clean off the wound with a little bit of additional debridement today and likely we can transition away from the Santyl to some kind of collagen type dressing. 10/28/2019 upon evaluation today patient actually appears to be doing fairly well with regard to her wound today. She has been tolerating the dressing changes we did switch to collagen and fortunately that seems to be doing well for her at this point. There does not seem to be any signs of active infection I do feel like the surface of the wound is looking better at this time as well. 11/04/2019 upon evaluation today patient appears to be doing well with regard to her wound. She is making some progress here and the wound does appear to be measuring smaller which is good news. Overall this is good to be somewhat slow but nonetheless I am encouraged by the fact that the collagen does seem to have helped her. 11/11/2019 on evaluation today patient's wound is showing some signs of improvement. The good news is her overall trend does seem to be towards getting better compared to where she was  previous. The smaller wounds along the side actually might even be healed but we can watch this 1 more week before I heal this out. Overall I feel like there is improvement here. She is still using the silver collagen. 11/18/2019 upon evaluation today patient appears to be doing about the same with regard to her wound. This is not significantly smaller there still appears to be a lot of inflammation. For that reason I do want to see about actually applying triamcinolone to the actual wound bed to see if this can be of benefit as well. I thought about this for couple weeks but I think it may be a good thing to proceed with at this point. 11/25/2019 on evaluation today patient appears to be doing well with regard to her lower extremity ulcer. She has been tolerating the dressing changes without complication. Fortunately there is no signs of active infection at this time. No fevers, chills, nausea, vomiting, or diarrhea. 12/02/2019 on evaluation today patient appears to be doing okay with regard to her ulcer on the leg. Fortunately there is no signs of active infection at this time. No fever chills noted she has been tolerating the dressing changes without complication. With that being said overall I am pleased with the fact that there is no signs of infection but at the same time I really feel like she needs more to progress moving week to week and right now were really not seeing that. We have put in for approval for Apligraf which I think could help to move things along. With that being said we have not heard anything regarding approval at this point. 12/10/2019; patient comes in on my day although have not seen her previously. She has what was a traumatic wound on the left anterior mid tibia. Initially sutured however the area never really healed according to the patient. She has been using Santyl now using Prisma. There is exposed tendon. We have made application for Apligraf apparently this is going  through [employer] still has not been approved. She arrives in clinic  with erythema around the wound and 4 small circular papules superior to the wound. These are nontender and she is not complaining of any more pain. The exact cause of this is not really clear. The patient has idiopathic peripheral neuropathy which for which she has been extensively evaluated in the past she is not a diabetic she does not have any rheumatologic problems she is aware of. She cannot wear compression because it aggravates her neuropathy 4/15; traumatic wound on the left anterior mid tibia. She arrives today with extensive erythema spreading from the wound medially towards her heel. This is tender swollen and warm compatible with cellulitis. We are using silver collagen to the wound 4/20; 5-day follow-up. Traumatic wound on the left anterior mid tibia. This is in follow-up for the cellulitis she had on her visit 5 days ago. I gave her empiric cephalexin I did not culture this. She arrives today with the erythema that we marked much improved. She has not been systemically unwell Upon evaluation today patient's wound unfortunately is not doing very well this in fact is somewhat deeper than even last week4/28/2021 which is unfortunate. I am very concerned about the fact that to be honest that the patient does not seem to be doing as good as we would have expected in the past 2 weeks since I last saw her. Fortunately there is no evidence of active infection systemically which is good news. No fevers, chills, nausea, vomiting, or diarrhea. She has been on Keflex. 01/06/2020 upon evaluation today patient unfortunately appears to still be doing somewhat poorly overall in regard to her ulcer. There is definite signs of infection. She has been on Keflex in April but unfortunately this just does not seem to be doing as well as we would like. I do believe that we will get a need to place her on Augmentin in order to try to  get this better. She tested positive for Proteus and E. coli on the culture and is allergic to doxycycline. The patient's wound currently is not looking healthy at all to me. 01/27/2020 upon evaluation today patient appears to be doing maybe slightly better with regard to the wound bed at this point. Her infection is definitely improved which is great news. As far as the Apligraf I do not believe the wound surface is quite clean enough yet in order for [place] to apply the Apligraf. Another potential option would be for a snap VAC to be used under the Curlex and Coban wrap working to try the wrap today. If we can get rid of some of the fluid I think this will help her with healing also think the snap VAC could be beneficial in this regard. Nonetheless again I am not given up on the Apligraf is a possibility I just do not want a waste the applications by putting it on before the wound surface is ready. Potentially the snap VAC can get this wound to the point where it is ready much more quickly. 02/03/2020 upon evaluation today patient appears to be doing excellent with regard to her leg ulcer compared to where things have been. The overall quality of the wound bed is significantly improved and very pleased in this regard. We did get approval for the snap VAC which I am wanting to go ahead and see about initiating at this point. We also have approval for the Apligraf when and if the time is appropriate for this. With that being said I really think using the snap VAC  to try to get this to fill-in would be appropriate to begin with. The patient is in agreement with that plan. I do believe the compression did help her and she states that that was uncomfortable she was able to manage with that for now. 02/10/2020 upon evaluation today patient appears to be doing better with regard to her original and largest wound. 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 I explained that pyoderma is incompletely understood and obviously very difficult sometimes to treat but often steroid therapy is the mainstay of treatment. I think topical steroid treatment is appropriate at this point. She does have mometasone which has been prescribed previously by her primary care provider and she has that with her so we can use that on some of the satellite areas not on the main wound. The patient is in agreement with that plan. 02/17/2020 upon evaluation today patient appears to be doing well with regard to her wound. She is making good progress with the snap VAC actually believe that she is in a good position for [place] to be able to continue the snap VAC but actually at the Apligraf to try to speed up the granulation and epithelial growth. The patient is actually in agreement with that she wants to try to get this healed as quickly as possible I completely agree with her. She did fill up the canister again today. 03/02/2020 upon evaluation today patient appears to be doing well at this time with regard to her wound. In fact this is dramatically improved with the use of the snap VAC and the Apligraf even since the last time I saw her 2 weeks ago. Again week she had a nurse visit and changed out the snap VAC last week. Overall I think that she is definitely in a much better place and this wound is definitely making progress measuring smaller and overall I think progressing quite well. 7/13; wound surface looks very healthy. She has a small superior satellite lesion as well. Apligraf applied with a snap VAC. 03/30/2020 on evaluation today patient presents for follow-up concerning her lower extremity ulcers. Fortunately there is no signs of active infection at this time which is great news she has been tolerating the dressing changes without complication.  Fortunately there is no evidence of anything really worsening the smaller wound more proximal does have some slough noted that is can require sharp debridement today however the main wound for which we have been applying Apligraf and the wound VAC seems to be doing quite well. I am extremely pleased with where things stand today. 04/13/2020 upon evaluation today patient appears to be doing much better in regard to the larger of her 2 wounds. I think we do not even really need the snap VAC any longer at this point. With regard to the smaller wound I think that this is good to be something that will continue to be beneficial as far as using the snap VAC on is concerned. We are going to reapply the Apligraf today. 04/27/2020 on evaluation today patient appears to be doing excellent in regard to both of her wounds. The snap VAC help the smaller area to feeling quite well and overall I think were very close to getting both to close. I believe collagen may be  a good option at this point. 05/04/2020 upon evaluation today patient appears to be doing very well in regard to her wounds. Everything is measuring smaller and seems to be doing quite well. Overall very pleased with where things stand and the patient is extremely happy. Both wounds are getting closer and closer to complete closure and the smaller of the 2 is almost completely healed. Overall very excited for her. 05/11/2020 on evaluation today patient appears to be doing well with regard to her wound. She has been tolerating the dressing changes without complication the Prisma has done excellent for her. The smaller of the 2 does appear to be completely healed the larger is significantly improved and overall I am very pleased with where things stand today. There is no evidence of active infection at this time. 05/18/2020 on evaluation today patient appears to be doing excellent in regard to her leg ulcer. In fact this is measuring about half the size it was  last week. Overall I am extremely pleased. I do believe the collagen is doing a great job. 05/25/2020 on evaluation today patient actually appears to be making excellent progress in regard to her wound overall I feel like she has done extremely well at this point. There is no evidence of active infection and overall I am pleased with the appearance of everything today. The patient is extremely happy. 06/01/2020 upon evaluation today patient appears to actually be doing quite well with regard to her wound at this point. Fortunately I feel like she is pretty much completely healed I do not see anything truly open at this point which is great news. Nonetheless I do believe that the patient still is having some issues here with brand-new skin that we need to let toughen up before we release her. 06/08/2020 upon evaluation today patient appears to be doing very well with regard to her wound in fact this appears to be completely healed which is great news she is very pleased to see this. Objective Constitutional Well-nourished and well-hydrated in no acute distress. Vitals Time Taken: 8:07 AM, Height: 66 in, Weight: 156 lbs, BMI: 25.2, Temperature: 98.1 F, Pulse: 84 bpm, Respiratory Rate: 18 breaths/min, Blood Pressure: 97/49 mmHg. Respiratory normal breathing without difficulty. Psychiatric this patient is able to make decisions and demonstrates good insight into disease process. Alert and Oriented x 3. pleasant and cooperative. General Notes: His wound again showed signs of complete epithelization there is no evidence of active infection and no signs of anything worsening at this point. In general I am extremely pleased with where we stand. Integumentary (Hair, Skin) Wound #2 status is Open. Original cause of wound was Trauma. The wound is located on the Left,Anterior Lower Leg. The wound measures 0cm length x 0cm width x 0cm depth; 0cm^2 area and 0cm^3 volume. Assessment Active  Problems ICD-10 Venous insufficiency (chronic) (peripheral) Non-pressure chronic ulcer of other part of left lower leg with fat layer exposed Cellulitis of left lower limb Non-pressure chronic ulcer of other part of left lower leg with other specified severity Non-pressure chronic ulcer of left ankle with fat layer exposed Raynaud's syndrome without gangrene Hereditary and idiopathic neuropathy, unspecified Plan Discharge From [facility] Services: Discharge from Wound Care Center Skin Barriers/Peri-Wound Care: Moisturizing lotion - to legs daily Edema Control: Avoid standing for long periods of time Elevate legs to the level of the heart or above for 30 minutes daily and/or when sitting, a frequency of: Exercise regularly 1. I would recommend that we go and  discontinue wound care services as the patient appears to be completely healed and is doing extremely well. 2. I am also can recommend that she monitor for any signs of excessive swelling if she has any issues she knows to go ahead and initiate a compression wrap in order to get some of the fluid down and elevate her legs. She can exercise regularly. We will see the patient back for follow-up visit as needed. Based on the fact that the patient is completely healed she is definitely able to return to work with no restrictions on my part with what she can or cannot do. Electronic Signature(s) Signed: 06/22/2020 12:12:55 PM By: Lenda Kelp PA-C Previous Signature: 06/08/2020 8:35:08 AM Version By: Lenda Kelp PA-C Entered By: Lenda Kelp on 06/22/2020 12:12:54 -------------------------------------------------------------------------------- SuperBill Details Patient Name: Date of Service: [name] 06/08/2020 Medical Record Number: [id number] Patient Account Number: [id number] Date of Birth/Sex: Treating RN: May 23, 1961 (59 y.o. [name] Standard Primary Care Provider: Docia Chuck, [place] Other Clinician: Referring  Provider: Treating Provider/Extender: Richardo Priest, [place] Weeks in Treatment: 36 Diagnosis Coding ICD-10 Codes Code Description I87.2 Venous insufficiency (chronic) (peripheral) L97.822 Non-pressure chronic ulcer of other part of left lower leg with fat layer exposed L03.116 Cellulitis of left lower limb L97.828 Non-pressure chronic ulcer of other part of left lower leg with other specified severity L97.322 Non-pressure chronic ulcer of left ankle with fat layer exposed I73.00 Raynaud's syndrome without gangrene 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, unspecified Facility Procedures CPT4 Code: [id number] Description: [phone] - WOUND CARE VISIT-LEV 2 EST PT Modifier: Quantity: 1 Physician Procedures : CPT4 Code Description Modifier [id number] - WC PHYS LEVEL 3 - EST PT ICD-10 Diagnosis Description I87.2 Venous insufficiency (chronic) (peripheral) L97.822 Non-pressure chronic ulcer of other part of left lower leg with fat layer exposed L03.116  Cellulitis of left lower limb L97.828 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

## 2020-06-08 NOTE — Progress Notes (Signed)
[name] ([id number]) Visit Report for 06/08/2020 Arrival Information Details Patient Name: Date of Service: [name], [name] 06/08/2020 8:00 A M Medical Record Number: [id number] Patient Account Number: [id number] Date of Birth/Sex: Treating RN: [birth year] (59 y.o. [name] Standard Primary Care Zhyon Antenucci: Docia Chuck, [place] Other Clinician: Referring Caasi Giglia: Treating Dodge Ator/Extender: Richardo Priest, [place] Weeks in Treatment: 26 Visit Information History Since Last Visit Added or deleted any medications: No Patient Arrived: Ambulatory Any new allergies or adverse reactions: No Arrival Time: 08:06 Had a fall or experienced change in No Accompanied By: self activities of daily living that may affect Transfer Assistance: Manual risk of falls: Patient Identification Verified: Yes Signs or symptoms of abuse/neglect since last visito No Secondary Verification Process Completed: Yes Hospitalized since last visit: No Patient Requires Transmission-Based Precautions: No Implantable device outside of the clinic excluding No Patient Has Alerts: Yes cellular tissue based products placed in the center since last visit: Has Dressing in Place as Prescribed: Yes Pain Present Now: No Electronic Signature(s) Signed: 06/08/2020 10:23:51 AM By: Karl Ito Entered By: Karl Ito on 06/08/2020 08:06:52 -------------------------------------------------------------------------------- Clinic Level of Care Assessment Details Patient Name: Date of Service: [name] 06/08/2020 8:00 A M Medical Record Number: [id number] Patient Account Number: [id number] Date of Birth/Sex: Treating RN: Jul 19, 1961 (59 y.o. [name] Standard Primary Care Daris Aristizabal: Docia Chuck, [place] Other Clinician: Referring Nicklas Mcsweeney: Treating Elsie Sakuma/Extender: Richardo Priest, [place] Weeks in Treatment: 3 Clinic Level of Care Assessment Items TOOL 4 Quantity Score []  - 0 Use when only an  EandM is performed on FOLLOW-UP visit ASSESSMENTS - Nursing Assessment / Reassessment X- 1 10 Reassessment of Co-morbidities (includes updates in patient status) X- 1 5 Reassessment of Adherence to Treatment Plan ASSESSMENTS - Wound and Skin A ssessment / Reassessment X - Simple Wound Assessment / Reassessment - one wound 1 5 []  - 0 Complex Wound Assessment / Reassessment - multiple wounds []  - 0 Dermatologic / Skin Assessment (not related to wound area) ASSESSMENTS - Focused Assessment []  - 0 Circumferential Edema Measurements - multi extremities []  - 0 Nutritional Assessment / Counseling / Intervention X- 1 5 Lower Extremity Assessment (monofilament, tuning fork, pulses) []  - 0 Peripheral Arterial Disease Assessment (using hand held doppler) ASSESSMENTS - Ostomy and/or Continence Assessment and Care []  - 0 Incontinence Assessment and Management []  - 0 Ostomy Care Assessment and Management (repouching, etc.) PROCESS - Coordination of Care X - Simple Patient / Family Education for ongoing care 1 15 []  - 0 Complex (extensive) Patient / Family Education for ongoing care X- 1 10 Staff obtains , Records, T Results / Process Orders est []  - 0 Staff telephones HHA, Nursing Homes / Clarify orders / etc []  - 0 Routine Transfer to another Facility (non-emergent condition) []  - 0 Routine Hospital Admission (non-emergent condition) []  - 0 New Admissions / / Ordering NPWT Apligraf, etc. , []  - 0 Emergency Hospital Admission (emergent condition) X- 1 10 Simple Discharge Coordination []  - 0 Complex (extensive) Discharge Coordination PROCESS - Special Needs []  - 0 Pediatric / Minor Patient Management []  - 0 Isolation Patient Management []  - 0 Hearing / Language / Visual special needs []  - 0 Assessment of Community assistance (transportation, D/C planning, etc.) []  - 0 Additional assistance / Altered mentation []  - 0 Support Surface(s)  Assessment (bed, cushion, seat, etc.) INTERVENTIONS - Wound Cleansing / Measurement X - Simple Wound Cleansing - one wound 1 5 []  - 0 Complex Wound Cleansing - multiple wounds  X- 1 5 Wound Imaging (photographs - any number of wounds) []  - 0 Wound Tracing (instead of photographs) []  - 0 Simple Wound Measurement - one wound []  - 0 Complex Wound Measurement - multiple wounds INTERVENTIONS - Wound Dressings []  - 0 Small Wound Dressing one or multiple wounds []  - 0 Medium Wound Dressing one or multiple wounds []  - 0 Large Wound Dressing one or multiple wounds []  - 0 Application of Medications - topical []  - 0 Application of Medications - injection INTERVENTIONS - Miscellaneous []  - 0 External ear exam []  - 0 Specimen Collection (cultures, biopsies, blood, body fluids, etc.) []  - 0 Specimen(s) / Culture(s) sent or taken to Lab for analysis []  - 0 Patient Transfer (multiple staff / / Similar devices) []  - 0 Simple Staple / Suture removal (25 or less) []  - 0 Complex Staple / Suture removal (26 or more) []  - 0 Hypo / Hyperglycemic Management (close monitor of Blood Glucose) []  - 0 Ankle / Brachial Index (ABI) - do not check if billed separately X- 1 5 Vital Signs Has the patient been seen at the hospital within the last three years: Yes Total Score: 75 Level Of Care: New/Established - Level 2 Electronic Signature(s) Signed: 06/08/2020 6:14:29 PM By: RN, BSN Entered By: on 06/08/2020 08:32:08 -------------------------------------------------------------------------------- Encounter Discharge Information Details Patient Name: Date of Service: B. 06/08/2020 8:00 A M Medical Record Number: Patient Account Number: Date of Birth/Sex: Treating RN: 1961/05/30 (59 y.o. Primary Care Kilo Eshelman: Nurse, adult, [place] Other Clinician: Referring Alana Dayton: Treating Finnick Orosz/Extender: , [place] Weeks in Treatment: 37 Encounter Discharge Information Items Discharge Condition: Stable Ambulatory Status: Ambulatory Discharge Destination: Home Transportation: Private Auto Accompanied By: self Schedule Follow-up Appointment: Yes Clinical Summary of Care: Patient Declined Electronic Signature(s) Signed: 06/08/2020 6:14:29 PM By: RN, BSN Entered By: 08/08/2020 on 06/08/2020 08:32:55 -------------------------------------------------------------------------------- Lower Extremity Assessment Details Patient Name: Date of Service: Zenaida Deed B. 06/08/2020 8:00 A M Medical Record Number: Arrie Aran Patient Account Number: 08/08/2020 Date of Birth/Sex: Treating RN: 11-Sep-1960 (60 y.o. 03/12/1961 Primary Care Haaris Metallo: 42, [place] Other Clinician: Referring Jsoeph Podesta: Treating Lucianne Smestad/Extender: [name] Standard, [place] Weeks in Treatment: 36 Edema Assessment Assessed: [Left: No] [Right: No] Edema: [Left: N] [Right: o] Calf Left: Right: Point of Measurement: 29 cm From Medial Instep 32 cm Ankle Left: Right: Point of Measurement: 8 cm From Medial Instep 21 cm Vascular Assessment Pulses: Dorsalis Pedis Palpable: [Left:Yes] Electronic Signature(s) Signed: 06/08/2020 10:23:51 AM By: Richardo Priest Signed: 06/08/2020 6:14:29 PM By: 08/08/2020 RN, BSN Entered By: Zenaida Deed on 06/08/2020 08:08:58 -------------------------------------------------------------------------------- Multi-Disciplinary Care Plan Details Patient Name: Date of Service: 08/08/2020 B. 06/08/2020 8:00 A M Medical Record Number: 08/08/2020 Patient Account Number: [id number] Date of Birth/Sex: Treating RN: Nov 02, 1960 (59 y.o. 41 Primary Care Tecla Mailloux: [name] Standard, Docia Chuck Other Clinician: Referring Lemmie Vanlanen: Treating Hines Kloss/Extender: Richardo Priest, [place] Weeks in Treatment: 59 Active Inactive Electronic  Signature(s) Signed: 06/08/2020 6:14:29 PM By: 08/08/2020 RN, BSN Entered By: Zenaida Deed on 06/08/2020 08:08:04 -------------------------------------------------------------------------------- Pain Assessment Details Patient Name: Date of Service: 08/08/2020 06/08/2020 8:00 A M Medical Record Number: 08/08/2020 Patient Account Number: [id number] Date of Birth/Sex: Treating RN: April 24, 1961 (59 y.o. 41 Primary Care Daisi Kentner: [name] Standard, [place] Other Clinician: Referring Montray Kliebert: Treating Aqueelah Cotrell/Extender: Docia Chuck, [place] Weeks in Treatment: 92 Active Problems Location of Pain Severity and Description of  Pain Patient Has Paino No Site Locations Pain Management and Medication Current Pain Management: Electronic Signature(s) Signed: 06/08/2020 10:23:51 AM By: Karl Ito Signed: 06/08/2020 6:14:29 PM By: Zenaida Deed RN, BSN Entered By: Karl Ito on 06/08/2020 08:08:20 -------------------------------------------------------------------------------- Patient/Caregiver Education Details Patient Name: Date of Service: [name] 10/6/2021andnbsp8:00 A M Medical Record Number: [id number] Patient Account Number: [id number] Date of Birth/Gender: Treating RN: 09/01/1961 (59 y.o. [name] Standard Primary Care Physician: Docia Chuck, [place] Other Clinician: Referring Physician: Treating Physician/Extender: Richardo Priest, [place] Weeks in Treatment: 74 Education Assessment Education Provided To: Patient Education Topics Provided Venous: Methods: Explain/Verbal Responses: Reinforcements needed, State content correctly Wound/Skin Impairment: Methods: Explain/Verbal Responses: Reinforcements needed, State content correctly Electronic Signature(s) Signed: 06/08/2020 6:14:29 PM By: Zenaida Deed RN, BSN Entered By: Zenaida Deed on 06/08/2020  08:08:52 -------------------------------------------------------------------------------- Wound Assessment Details Patient Name: Date of Service: [name] 06/08/2020 8:00 A M Medical Record Number: [id number] Patient Account Number: [id number] Date of Birth/Sex: Treating RN: 1961-06-27 (59 y.o. [name] Standard Primary Care Larwence Tu: Docia Chuck, [place] Other Clinician: Referring Pancho Rushing: Treating Naiyah Klostermann/Extender: Richardo Priest, [place] Weeks in Treatment: 36 Wound Status Wound Number: 2 Primary Etiology: Venous Leg Ulcer Wound Location: Left, Anterior Lower Leg Wound Status: Open Wounding Event: Trauma Date Acquired: 08/06/2019 Weeks Of Treatment: 36 Clustered Wound: No Wound Measurements Length: (cm) Width: (cm) Depth: (cm) Area: (cm) Volume: (cm) 0 % Reduction in Area: 100% 0 % Reduction in Volume: 100% 0 0 0 Wound Description Classification: Full Thickness With Exposed Support Structures Electronic Signature(s) Signed: 06/08/2020 10:23:51 AM By: Karl Ito Signed: 06/08/2020 6:14:29 PM By: Zenaida Deed RN, BSN Entered By: Karl Ito on 06/08/2020 42:59:56 -------------------------------------------------------------------------------- Vitals Details Patient Name: Date of Service: [name] 06/08/2020 8:00 A M Medical Record Number: [id number] Patient Account Number: [id number] Date of Birth/Sex: Treating RN: 1961-04-18 (59 y.o. [name] Standard Primary Care Chloris Marcoux: Docia Chuck, [place] Other Clinician: Referring Lesly Pontarelli: Treating Roselind Klus/Extender: Richardo Priest, [place] Weeks in Treatment: 47 Vital Signs Time Taken: 08:07 Temperature (F): 98.1 Height (in): 66 Pulse (bpm): 84 Weight (lbs): 156 Respiratory Rate (breaths/min): 18 Body Mass Index (BMI): 25.2 Blood Pressure (mmHg): 97/49 Reference Range: 80 - 120 mg / dl Electronic Signature(s) Signed: 06/08/2020 10:23:51 AM By: Karl Ito Entered By:  Karl Ito on 06/08/2020 08:08:05

## 2020-06-23 ENCOUNTER — Ambulatory Visit: Payer: 59 | Admitting: Neurology

## 2020-07-06 ENCOUNTER — Telehealth: Payer: Self-pay | Admitting: Neurology

## 2020-07-06 NOTE — Telephone Encounter (Signed)
Confirmed with pt via mychart message that her appt was not cancelled and is still scheduled.

## 2020-07-07 ENCOUNTER — Ambulatory Visit: Payer: 59 | Admitting: Neurology

## 2020-07-07 ENCOUNTER — Encounter: Payer: Self-pay | Admitting: Neurology

## 2020-07-07 ENCOUNTER — Other Ambulatory Visit: Payer: Self-pay

## 2020-07-07 VITALS — BP 116/74 | HR 70 | Ht 66.0 in | Wt 159.8 lb

## 2020-07-07 DIAGNOSIS — G43009 Migraine without aura, not intractable, without status migrainosus: Secondary | ICD-10-CM | POA: Diagnosis not present

## 2020-07-07 DIAGNOSIS — G609 Hereditary and idiopathic neuropathy, unspecified: Secondary | ICD-10-CM | POA: Diagnosis not present

## 2020-07-07 MED ORDER — HYDROCODONE-ACETAMINOPHEN 5-325 MG PO TABS: 1.0000 | ORAL_TABLET | Freq: Four times a day (QID) | ORAL | 0 refills | Status: DC | PRN

## 2020-07-07 MED ORDER — SUMATRIPTAN SUCCINATE 6 MG/0.5ML ~~LOC~~ SOLN: 6.0000 mg | Freq: Two times a day (BID) | SUBCUTANEOUS | 3 refills | Status: AC | PRN

## 2020-07-07 MED ORDER — SUMATRIPTAN SUCCINATE 100 MG PO TABS: 100.0000 mg | ORAL_TABLET | Freq: Two times a day (BID) | ORAL | 2 refills | Status: DC | PRN

## 2020-07-07 MED ORDER — CARBAMAZEPINE ER 200 MG PO CP12: 200.0000 mg | ORAL_CAPSULE | Freq: Two times a day (BID) | ORAL | 3 refills | Status: DC

## 2020-07-07 MED ORDER — GABAPENTIN 300 MG PO CAPS: ORAL_CAPSULE | ORAL | 3 refills | Status: DC

## 2020-07-07 NOTE — Progress Notes (Signed)
I have read the note, and I agree with the clinical assessment and plan.  Drey Shaff K Maurine Mowbray   

## 2020-07-07 NOTE — Progress Notes (Signed)
PATIENT: [name] DOB: Jun 30, 1961  REASON FOR VISIT: follow up HISTORY FROM: patient  HISTORY OF PRESENT ILLNESS: Today 07/07/20 [name] is a 59 year old female with history of migraines and peripheral neuropathy.  Migraines, are well controlled, keeps Imitrex tablets and injection on hand just in case. May have a few a week, then go several weeks without any headache.  For neuropathy is on Carbatrol, gabapentin, and occasionally hydrocodone at night to help with rest if needed.  Has been on B12 injections for many years, PCP has just taken this over, they follow blood levels.  Had a significant wound to her left lower leg, with cellulitis, it just healed, she was going to the wound center (Dec-Oct).  Is overall doing well, has noted a cold sensation in her hands, wonders if neuropathy may be affecting the hands.  She remains active, does Zumba, and walks.  Carbamazepine level was 5.0 in April 2021.  Here today for follow-up unaccompanied.  HISTORY  12/23/2019 SS: [name] is a 59 year old female with history of migraines and peripheral neuropathy.  Her headaches are currently under excellent control, she has not had a headache in about 5 months.  She has Imitrex tablets and injections on hand just in case.  She sustained a laceration to her left lower leg in December, has had delayed wound healing.  Is going to the wound center, having weekly debridements.  She recently contracted cellulitis, just finished her antibiotic.  For neuropathy she remains on Carbatrol and gabapentin.  She will occasionally take hydrocodone at night to help her rest.  She also receives B12 injection prescription from this office, her primary doctor follows levels, Dr. Sandria Manly has had her on these.  She presents today for evaluation unaccompanied.  REVIEW OF SYSTEMS: Out of a complete 14 system review of symptoms, the patient complains only of the following symptoms, and all other reviewed systems are  negative.  Neuropathy, headache  ALLERGIES: Allergies  Allergen Reactions  . Doxycycline Hyclate   . Lyrica [Pregabalin]   . Vivelle-Dot [Estradiol]     Adhesive on patch  . Aleve [Naproxen Sodium]     swelling  . Adhesive  [Tape] Rash  . Ibuprofen     HOME MEDICATIONS: Outpatient Medications Prior to Visit  Medication Sig Dispense Refill  . Cholecalciferol (VITAMIN D3) 2000 UNITS TABS Take 2,000 Units by mouth daily.    . cyanocobalamin (,VITAMIN B-12,) 1000 MCG/ML injection INJECT 1 ML INTRAMUSCULARLY EVERY 30 DAYS AS DIRECTED 10 mL 1  . EVAMIST 1.53 MG/SPRAY transdermal spray Place 1.53 mg onto the skin daily.    Marland Kitchen gabapentin (NEURONTIN) 300 MG capsule TAKE 1 CAPSULE BY MOUTH AT 5PM AND TAKE TAKE TWO CAPSULES BY MOUTH EVERY NIGHT AT BEDTIME 270 capsule 3  . levothyroxine (SYNTHROID, LEVOTHROID) 75 MCG tablet Take 75 mcg by mouth daily before breakfast.    . Pediatric Multivit-Minerals-C (MULTIVITAMIN GUMMIES CHILDRENS) CHEW Chew 1 tablet by mouth daily.    Marland Kitchen ALPRAZolam (XANAX) 0.25 MG tablet Take 0.25 mg by mouth at bedtime as needed for sleep.    . carbamazepine (CARBATROL) 200 MG 12 hr capsule TAKE ONE CAPSULE BY MOUTH TWICE A DAY 60 capsule 11  . HYDROcodone-acetaminophen (NORCO/VICODIN) 5-325 MG tablet Take 1 tablet by mouth every 6 (six) hours as needed. Must last 28 days. 30 tablet 0  . progesterone (PROMETRIUM) 200 MG capsule Take 200 mg by mouth. Take 5 tablets at bedtime every four months.    . SUMAtriptan (  IMITREX) 100 MG tablet Take 1 tablet (100 mg total) by mouth 2 (two) times daily as needed for up to 1 dose for migraine. 10 tablet 2  . SUMAtriptan (IMITREX) 6 MG/0.5ML SOLN injection Inject 0.5 mLs (6 mg total) into the skin 2 (two) times daily as needed for migraine or headache. 6 vial 3   No facility-administered medications prior to visit.    PAST MEDICAL HISTORY: Past Medical History:  Diagnosis Date  . Ankle fracture   . Ankle fracture, left   . Glaucoma     narrow angle  . Migraines   . Myeloperoxidase deficiency (HCC)   . Periodic limb movement disorder 04/08/2015  . Peripheral neuropathy    Right  . Raynaud disease 04/24/2013  . Vitamin B12 deficiency     PAST SURGICAL HISTORY: Past Surgical History:  Procedure Laterality Date  . dental implants     x3  . gastrocsoleus biopsy    . KNEE SURGERY Left   . lip biopsy    . SURAL NERVE BIOPSY      FAMILY HISTORY: Family History  Problem Relation Age of Onset  . Migraines Mother   . Cancer Mother   . Aneurysm Father   . Parkinsonism Maternal Grandfather   . Cancer Maternal Grandfather   . Heart disease Maternal Grandfather   . Heart disease Paternal Grandfather   . Stroke Paternal Grandfather     SOCIAL HISTORY: Social History   Socioeconomic History  . Marital status: Married    Spouse name: Ed  . Number of children: 0  . Years of education: 7  . Highest education level: Not on file  Occupational History  . Occupation: Print production planner  Tobacco Use  . Smoking status: Never Smoker  . Smokeless tobacco: Never Used  Substance and Sexual Activity  . Alcohol use: Yes    Alcohol/week: 0.0 standard drinks    Comment: occasionally  . Drug use: No  . Sexual activity: Not on file  Other Topics Concern  . Not on file  Social History Narrative   Lives at home, married   Patient drinks about 2 cups of caffeine daily.   Patient is right handed.   Social Determinants of Health   Financial Resource Strain:   . Difficulty of Paying Living Expenses: Not on file  Food Insecurity:   . Worried About Programme researcher, broadcasting/film/video in the Last Year: Not on file  . Ran Out of Food in the Last Year: Not on file  Transportation Needs:   . Lack of Transportation (Medical): Not on file  . Lack of Transportation (Non-Medical): Not on file  Physical Activity:   . Days of Exercise per Week: Not on file  . Minutes of Exercise per Session: Not on file  Stress:   . Feeling of Stress : Not on  file  Social Connections:   . Frequency of Communication with Friends and Family: Not on file  . Frequency of Social Gatherings with Friends and Family: Not on file  . Attends Religious Services: Not on file  . Active Member of Clubs or Organizations: Not on file  . Attends Banker Meetings: Not on file  . Marital Status: Not on file  Intimate Partner Violence:   . Fear of Current or Ex-Partner: Not on file  . Emotionally Abused: Not on file  . Physically Abused: Not on file  . Sexually Abused: Not on file   PHYSICAL EXAM  Vitals:   07/07/20 1001  BP: 116/74  Pulse: 70  Weight: 159 lb 12.8 oz (72.5 kg)  Height: 5\' 6"  (1.676 m)   Body mass index is 25.79 kg/m.  Generalized: Well developed, in no acute distress   Neurological examination  Mentation: Alert oriented to time, place, history taking. Follows all commands speech and language fluent Cranial nerve II-XII: Pupils were equal round reactive to light. Extraocular movements were full, visual field were full on confrontational test. Facial sensation and strength were normal. Head turning and shoulder shrug  were normal and symmetric. Motor: The motor testing reveals 5 over 5 strength of all 4 extremities. Good symmetric motor tone is noted throughout.  Sensory: Sensory testing is intact to soft touch on all 4 extremities. No evidence of extinction is noted.  Coordination: Decreased sensation to soft touch to mid shin bilaterally. Gait and station: Gait is normal. Tandem gait is slightly unsteady.  Romberg is negative. No drift is seen.  Reflexes: Deep tendon reflexes are symmetric and normal bilaterally.   DIAGNOSTIC DATA (LABS, IMAGING, TESTING) - I reviewed patient records, labs, notes, testing and imaging myself where available.  Lab Results  Component Value Date   WBC 6.3 12/23/2019   HGB 14.1 12/23/2019   HCT 42.2 12/23/2019   MCV 95 12/23/2019   PLT 389 12/23/2019      Component Value Date/Time    NA 140 12/23/2019 1557   K 5.3 (H) 12/23/2019 1557   CL 101 12/23/2019 1557   CO2 28 12/23/2019 1557   GLUCOSE 80 12/23/2019 1557   GLUCOSE 98 10/28/2008 1454   BUN 11 12/23/2019 1557   CREATININE 0.74 12/23/2019 1557   CALCIUM 9.6 12/23/2019 1557   PROT 7.3 12/23/2019 1557   ALBUMIN 4.5 12/23/2019 1557   AST 27 12/23/2019 1557   ALT 21 12/23/2019 1557   ALKPHOS 68 12/23/2019 1557   BILITOT 0.3 12/23/2019 1557   GFRNONAA 90 12/23/2019 1557   GFRAA 104 12/23/2019 1557   No results found for: CHOL, HDL, LDLCALC, LDLDIRECT, TRIG, CHOLHDL No results found for: 12/25/2019 No results found for: VITAMINB12 Lab Results  Component Value Date   TSH 10.760 (H) 04/24/2013    ASSESSMENT AND PLAN 59 y.o. year old female  has a past medical history of Ankle fracture, Ankle fracture, left, Glaucoma, Migraines, Myeloperoxidase deficiency (HCC), Periodic limb movement disorder (04/08/2015), Peripheral neuropathy, Raynaud disease (04/24/2013), and Vitamin B12 deficiency. here with:  1.  Migraine headaches -Currently well controlled -Continue Imitrex tablet, injections as needed for acute headache  2.  Peripheral neuropathy -Continue Carbatrol 200 mg tablet twice daily -Continue gabapentin 300 mg, 300 mg 5pm/600 mg QHS -Continue hydrocodone 5/325 as needed for severe pain (refill sent, drug registry checked, last fill was 01/27/20 # 30) -Carbamazepine level was 5.0 in April 2021 -PCP has now taken over B12 injections -Follow-up in 1 year or sooner if needed  I spent 20 minutes of face-to-face and non-face-to-face time with patient.  This included previsit chart review, lab review, study review, order entry, electronic health record documentation, patient education.  May 2021, AGNP-C, DNP 07/07/2020, 10:34 AM [facility] [address] [place], [place] [phone]

## 2020-07-07 NOTE — Patient Instructions (Signed)
Continue current medications  Continue seeing your primary doctor See you back in 1 year

## 2020-07-11 ENCOUNTER — Telehealth: Payer: Self-pay | Admitting: Neurology

## 2020-07-11 NOTE — Telephone Encounter (Signed)
Spoke with patient. She stated that she was able to pickup all of her medications yesterday without any problems.   Nothing further needed at time of call.

## 2020-07-11 NOTE — Telephone Encounter (Signed)
Received PA requests for carbamazepine ER 200mg , hydrocodone and sumatriptan. The information on the PA request does not match the insurance information we have in on file for her. The insurance on the PA request is for "[employer]". The information in Epic is for "[facility]". I called and left a message for her to call back to see if she was able to get her medication.

## 2020-10-03 ENCOUNTER — Other Ambulatory Visit: Payer: Self-pay | Admitting: *Deleted

## 2020-10-03 ENCOUNTER — Encounter: Payer: Self-pay | Admitting: Neurology

## 2020-10-03 MED ORDER — HYDROCODONE-ACETAMINOPHEN 5-325 MG PO TABS: 1.0000 | ORAL_TABLET | Freq: Four times a day (QID) | ORAL | 0 refills | Status: DC | PRN

## 2020-11-04 LAB — COLOGUARD: COLOGUARD: POSITIVE — AB

## 2021-01-05 ENCOUNTER — Other Ambulatory Visit: Payer: Self-pay | Admitting: Neurology

## 2021-01-19 ENCOUNTER — Encounter: Payer: Self-pay | Admitting: Neurology

## 2021-01-19 ENCOUNTER — Other Ambulatory Visit: Payer: Self-pay | Admitting: *Deleted

## 2021-01-19 MED ORDER — HYDROCODONE-ACETAMINOPHEN 5-325 MG PO TABS: 1.0000 | ORAL_TABLET | Freq: Four times a day (QID) | ORAL | 0 refills | Status: DC | PRN

## 2021-03-20 ENCOUNTER — Other Ambulatory Visit: Payer: Self-pay | Admitting: *Deleted

## 2021-03-20 ENCOUNTER — Encounter: Payer: Self-pay | Admitting: Neurology

## 2021-03-20 ENCOUNTER — Encounter: Payer: Self-pay | Admitting: *Deleted

## 2021-03-20 MED ORDER — HYDROCODONE-ACETAMINOPHEN 5-325 MG PO TABS: 1.0000 | ORAL_TABLET | Freq: Four times a day (QID) | ORAL | 0 refills | Status: DC | PRN

## 2021-03-20 NOTE — Telephone Encounter (Signed)
I will refill the hydrocodone, but please call and let her know, when Dr. Jessee Avers in November she will no longer be able to get this prescription from our office. Need to plan for that time, options include PCP prescribing or pain clinic.

## 2021-06-14 ENCOUNTER — Ambulatory Visit: Payer: 59 | Admitting: Neurology

## 2021-06-14 ENCOUNTER — Encounter: Payer: Self-pay | Admitting: Neurology

## 2021-06-14 VITALS — BP 131/84 | HR 68 | Ht 66.0 in | Wt 158.0 lb

## 2021-06-14 DIAGNOSIS — Z5181 Encounter for therapeutic drug level monitoring: Secondary | ICD-10-CM | POA: Diagnosis not present

## 2021-06-14 DIAGNOSIS — G4761 Periodic limb movement disorder: Secondary | ICD-10-CM | POA: Diagnosis not present

## 2021-06-14 DIAGNOSIS — E538 Deficiency of other specified B group vitamins: Secondary | ICD-10-CM | POA: Diagnosis not present

## 2021-06-14 DIAGNOSIS — D500 Iron deficiency anemia secondary to blood loss (chronic): Secondary | ICD-10-CM | POA: Diagnosis not present

## 2021-06-14 DIAGNOSIS — G609 Hereditary and idiopathic neuropathy, unspecified: Secondary | ICD-10-CM

## 2021-06-14 MED ORDER — HYDROCODONE-ACETAMINOPHEN 5-325 MG PO TABS: 1.0000 | ORAL_TABLET | Freq: Four times a day (QID) | ORAL | 0 refills | Status: DC | PRN

## 2021-06-14 MED ORDER — PRAMIPEXOLE DIHYDROCHLORIDE 0.25 MG PO TABS: 0.2500 mg | ORAL_TABLET | Freq: Every day | ORAL | 3 refills | Status: DC

## 2021-06-14 NOTE — Progress Notes (Signed)
Reason for visit: Peripheral neuropathy, migraine headache  [name] is an 60 y.o. female  History of present illness:  [name] is a 60 year old right-handed white female with a history of a peripheral neuropathy.  The patient has a history of a low B12 level in the past, she is on B12 supplementation.  The patient continues to have discomfort throughout the day with her legs with burning sensations and cold sensations.  She feels more discomfort when she is sitting.  She is on Carbatrol taking 200 mg twice daily, she is unable to tolerate gabapentin at any dose during the daytime, she will take a 300 mg capsule around 5 PM when she gets off work and then at that time.  The patient is having increased problems with periodic limb movement disorder, she is not resting well at night and is fatigued during the day.  She reports no severe issues with balance.  She has occasional headaches, she may only have 1 headache every 3 to 4 months, she takes Imitrex and may use some hydrocodone as a backup medication if the Imitrex does not work.  She returns to the office today for an evaluation.  Past Medical History:  Diagnosis Date   Ankle fracture    Ankle fracture, left    Glaucoma    narrow angle   Migraines    Myeloperoxidase deficiency (HCC)    Periodic limb movement disorder 04/08/2015   Peripheral neuropathy    Right   Raynaud disease 04/24/2013   Vitamin B12 deficiency     Past Surgical History:  Procedure Laterality Date   dental implants     x3   gastrocsoleus biopsy     KNEE SURGERY Left    lip biopsy     SURAL NERVE BIOPSY      Family History  Problem Relation Age of Onset   Migraines Mother    Cancer Mother    Aneurysm Father    Parkinsonism Maternal Grandfather    Cancer Maternal Grandfather    Heart disease Maternal Grandfather    Heart disease Paternal Grandfather    Stroke Paternal Grandfather     Social history:  reports that she has never  smoked. She has never used smokeless tobacco. She reports current alcohol use. She reports that she does not use drugs.    Allergies  Allergen Reactions   Doxycycline Hyclate    Lyrica [Pregabalin]    Vivelle-Dot [Estradiol]     Adhesive on patch   Aleve [Naproxen Sodium]     swelling   Adhesive  [Tape] Rash   Ibuprofen     Medications:  Prior to Admission medications   Medication Sig Start Date End Date Taking? Authorizing Provider  carbamazepine (CARBATROL) 200 MG 12 hr capsule Take 1 capsule (200 mg total) by mouth 2 (two) times daily. 07/07/20   Glean Salvo, NP  Cholecalciferol (VITAMIN D3) 2000 UNITS TABS Take 2,000 Units by mouth daily.    [provider]  cyanocobalamin (,VITAMIN B-12,) 1000 MCG/ML injection INJECT 1 ML INTRAMUSCULARLY EVERY 30 DAYS AS DIRECTED 06/24/19   Glean Salvo, NP  gabapentin (NEURONTIN) 300 MG capsule TAKE 1 CAPSULE BY MOUTH AT 5PM AND TAKE TAKE TWO CAPSULES BY MOUTH EVERY NIGHT AT BEDTIME 07/07/20   Glean Salvo, NP  HYDROcodone-acetaminophen (NORCO/VICODIN) 5-325 MG tablet Take 1 tablet by mouth every 6 (six) hours as needed. Must last 28 days. 03/20/21   Glean Salvo, NP  levothyroxine (SYNTHROID,  LEVOTHROID) 75 MCG tablet Take 75 mcg by mouth daily before breakfast.    [provider]  Pediatric Multivit-Minerals-C (MULTIVITAMIN GUMMIES CHILDRENS) CHEW Chew 1 tablet by mouth daily.    [provider]  SUMAtriptan (IMITREX) 100 MG tablet Take 1 tablet (100 mg total) by mouth 2 (two) times daily as needed for up to 1 dose for migraine. 07/07/20   Glean Salvo, NP  SUMAtriptan (IMITREX) 6 MG/0.5ML SOLN injection Inject 0.5 mLs (6 mg total) into the skin 2 (two) times daily as needed for migraine or headache. 07/07/20   Glean Salvo, NP    ROS:  Out of a complete 14 system review of symptoms, the patient complains only of the following symptoms, and all other reviewed systems are negative.  Headache Burning in the  feet  Blood pressure 131/84, pulse 68, height 5\' 6"  (1.676 m), weight 158 lb (71.7 kg).  Physical Exam  General: The patient is alert and cooperative at the time of the examination.  Skin: No significant peripheral edema is noted.   Neurologic Exam  Mental status: The patient is alert and oriented x 3 at the time of the examination. The patient has apparent normal recent and remote memory, with an apparently normal attention span and concentration ability.   Cranial nerves: Facial symmetry is present. Speech is normal, no aphasia or dysarthria is noted. Extraocular movements are full. Visual fields are full.  Motor: The patient has good strength in all 4 extremities.  Sensory examination: Soft touch sensation is symmetric on the face, arms, and legs.  Coordination: The patient has good finger-nose-finger and heel-to-shin bilaterally.  Gait and station: The patient has a normal gait. Tandem gait is minimally unsteady. Romberg is negative. No drift is seen.  Reflexes: Deep tendon reflexes are symmetric, and are relatively normal with exception of depression of the left ankle jerk reflex, ankle jerk reflex is present on the right.   Assessment/Plan:  1.  Peripheral neuropathy  2.  Migraine headache  3.  Periodic limb movement disorder  The patient may try taking the Carbatrol in the morning rather than at 3 or 4 PM to see if this helps her symptoms.  If not, the Carbatrol can be increased to 300 mg twice daily.  The patient will be sent for blood work today.  She had a positive Cologuard test in February 2022, she has not yet had a colonoscopy study.  We will look for iron deficiency that may be worsening her periodic limb movement disorder.  She will be given a prescription for Mirapex 0.25 milligrams tablets taking 1 at night.  She will follow-up here in 6 months, in the future she can be followed through Dr. [id number].  Terrace Arabia MD 06/14/2021 10:30 AM  [facility] [address] [place], [place]  Phone [phone] Fax [phone]

## 2021-06-15 LAB — COMPREHENSIVE METABOLIC PANEL
ALT: 31 IU/L (ref 0–32)
AST: 41 IU/L — ABNORMAL HIGH (ref 0–40)
Albumin/Globulin Ratio: 1.9 (ref 1.2–2.2)
Albumin: 4.9 g/dL (ref 3.8–4.9)
Alkaline Phosphatase: 89 IU/L (ref 44–121)
BUN/Creatinine Ratio: 14 (ref 9–23)
BUN: 12 mg/dL (ref 6–24)
Bilirubin Total: 0.3 mg/dL (ref 0.0–1.2)
CO2: 25 mmol/L (ref 20–29)
Calcium: 9.8 mg/dL (ref 8.7–10.2)
Chloride: 99 mmol/L (ref 96–106)
Creatinine, Ser: 0.86 mg/dL (ref 0.57–1.00)
Globulin, Total: 2.6 g/dL (ref 1.5–4.5)
Glucose: 91 mg/dL (ref 70–99)
Potassium: 4.6 mmol/L (ref 3.5–5.2)
Sodium: 139 mmol/L (ref 134–144)
Total Protein: 7.5 g/dL (ref 6.0–8.5)
eGFR: 78 mL/min/{1.73_m2} (ref >59)

## 2021-06-15 LAB — CBC WITH DIFFERENTIAL/PLATELET
Basophils Absolute: 0.1 10*3/uL (ref 0.0–0.2)
Basos: 1 %
EOS (ABSOLUTE): 0.1 10*3/uL (ref 0.0–0.4)
Eos: 1 %
Hematocrit: 44.7 % (ref 34.0–46.6)
Hemoglobin: 15 g/dL (ref 11.1–15.9)
Immature Grans (Abs): 0 10*3/uL (ref 0.0–0.1)
Immature Granulocytes: 0 %
Lymphocytes Absolute: 2.1 10*3/uL (ref 0.7–3.1)
Lymphs: 33 %
MCH: 30.9 pg (ref 26.6–33.0)
MCHC: 33.6 g/dL (ref 31.5–35.7)
MCV: 92 fL (ref 79–97)
Monocytes Absolute: 0.5 10*3/uL (ref 0.1–0.9)
Monocytes: 7 %
Neutrophils Absolute: 3.6 10*3/uL (ref 1.4–7.0)
Neutrophils: 58 %
Platelets: 331 10*3/uL (ref 150–450)
RBC: 4.85 x10E6/uL (ref 3.77–5.28)
RDW: 11.9 % (ref 11.7–15.4)
WBC: 6.4 10*3/uL (ref 3.4–10.8)

## 2021-06-15 LAB — IRON,TIBC AND FERRITIN PANEL
Ferritin: 209 ng/mL — ABNORMAL HIGH (ref 15–150)
Iron Saturation: 20 % (ref 15–55)
Iron: 65 ug/dL (ref 27–159)
Total Iron Binding Capacity: 319 ug/dL (ref 250–450)
UIBC: 254 ug/dL (ref 131–425)

## 2021-06-15 LAB — VITAMIN B12: Vitamin B-12: 778 pg/mL (ref 232–1245)

## 2021-06-15 LAB — CARBAMAZEPINE LEVEL, TOTAL: Carbamazepine (Tegretol), S: 6.4 ug/mL (ref 4.0–12.0)

## 2021-07-09 ENCOUNTER — Other Ambulatory Visit: Payer: Self-pay | Admitting: Neurology

## 2021-07-12 ENCOUNTER — Ambulatory Visit: Payer: 59 | Admitting: Neurology

## 2021-07-19 ENCOUNTER — Ambulatory Visit: Payer: 59 | Admitting: Neurology

## 2021-08-07 ENCOUNTER — Other Ambulatory Visit: Payer: Self-pay | Admitting: Neurology

## 2021-09-21 ENCOUNTER — Other Ambulatory Visit: Payer: Self-pay | Admitting: Gynecology

## 2021-09-21 DIAGNOSIS — Z13820 Encounter for screening for osteoporosis: Secondary | ICD-10-CM

## 2021-11-23 ENCOUNTER — Encounter: Payer: Self-pay | Admitting: Neurology

## 2021-11-23 ENCOUNTER — Ambulatory Visit: Payer: 59 | Admitting: Neurology

## 2021-11-23 VITALS — BP 128/86 | HR 63 | Ht 66.0 in | Wt 157.0 lb

## 2021-11-23 DIAGNOSIS — E538 Deficiency of other specified B group vitamins: Secondary | ICD-10-CM | POA: Insufficient documentation

## 2021-11-23 DIAGNOSIS — G43009 Migraine without aura, not intractable, without status migrainosus: Secondary | ICD-10-CM

## 2021-11-23 DIAGNOSIS — G609 Hereditary and idiopathic neuropathy, unspecified: Secondary | ICD-10-CM

## 2021-11-23 DIAGNOSIS — G4761 Periodic limb movement disorder: Secondary | ICD-10-CM | POA: Diagnosis not present

## 2021-11-23 DIAGNOSIS — N959 Unspecified menopausal and perimenopausal disorder: Secondary | ICD-10-CM | POA: Insufficient documentation

## 2021-11-23 DIAGNOSIS — Z86010 Personal history of colonic polyps: Secondary | ICD-10-CM | POA: Insufficient documentation

## 2021-11-23 DIAGNOSIS — N390 Urinary tract infection, site not specified: Secondary | ICD-10-CM | POA: Insufficient documentation

## 2021-11-23 DIAGNOSIS — D509 Iron deficiency anemia, unspecified: Secondary | ICD-10-CM | POA: Insufficient documentation

## 2021-11-23 DIAGNOSIS — E039 Hypothyroidism, unspecified: Secondary | ICD-10-CM | POA: Insufficient documentation

## 2021-11-23 MED ORDER — CARBAMAZEPINE ER 200 MG PO CP12: 200.0000 mg | ORAL_CAPSULE | Freq: Two times a day (BID) | ORAL | 3 refills | Status: DC

## 2021-11-23 MED ORDER — GABAPENTIN 300 MG PO CAPS: ORAL_CAPSULE | ORAL | 3 refills | Status: DC

## 2021-11-23 NOTE — Progress Notes (Signed)
? ? ?PATIENT: [name] ?DOB: 1960/11/23 ? ?REASON FOR VISIT: Follow up for peripheral neuropathy, migraine headache, periodic limb movement disorder ?HISTORY FROM: Patient ?PRIMARY NEUROLOGIST: Dr.Yan  ? ?ASSESSMENT AND PLAN ?61 y.o. year old female  ? ?1.  Peripheral neuropathy ?2.  Migraine headache ?3.  Periodic limb movement disorder ? ?[name] is overall stable, symptoms under fair control ?-Continue Carbatrol 200 mg twice daily ?-Continue gabapentin 300 mg 5PM, 600 mg before bed ?-Discussed hydrocodone, uses sparingly, since Dr. Anne Richardson retired encouraged her to discuss this prescription with PCP going forward ?-Continue Imitrex tablet, injection as needed for acute headache, rarely needs ?-Carbamazepine level was 6.4, CBC, CMP were unremarkable in October 2022, except mild elevated AST 41 ?-Follow-up in 1 year or sooner if needed, will be followed by Dr. Terrace Richardson  ? ?HISTORY OF PRESENT ILLNESS: ?Today 11/23/21 ?[name] here today for follow-up.  At last visit carbamazepine level was 6.4, no anemia, ferritin was slightly increased at 209, B12 was 778. Takes Carbatrol 200 mg twice daily, 300 mg PM, 600 mg at bedtime. Hydrocodone was filled by Dr. Anne Richardson in October 2022. Rarely takes hydrocodone. Migraines are every 2-3 months, has Imitrex tablet and injection as needed, works great. Has RLS, in past Mirapex caused nightmares. Had colonoscopy, benign polyp. Her mom has colon cancer, having resection soon. Having thyroid checked tomorrow.  ? ?HISTORY  ?06/14/2021 Dr. Anne Richardson: [name] is a 61 year old right-handed white female with a history of a peripheral neuropathy.  The patient has a history of a low B12 level in the past, she is on B12 supplementation.  The patient continues to have discomfort throughout the day with her legs with burning sensations and cold sensations.  She feels more discomfort when she is sitting.  She is on Carbatrol taking 200 mg twice daily, she is unable to tolerate gabapentin  at any dose during the daytime, she will take a 300 mg capsule around 5 PM when she gets off work and then at that time.  The patient is having increased problems with periodic limb movement disorder, she is not resting well at night and is fatigued during the day.  She reports no severe issues with balance.  She has occasional headaches, she may only have 1 headache every 3 to 4 months, she takes Imitrex and may use some hydrocodone as a backup medication if the Imitrex does not work.  She returns to the office today for an evaluation. ?  ?REVIEW OF SYSTEMS: Out of a complete 14 system review of symptoms, the patient complains only of the following symptoms, and all other reviewed systems are negative. ? ?See HPI ? ?ALLERGIES: ?Allergies  ?Allergen Reactions  ? Doxycycline Hyclate   ? Lyrica [Pregabalin]   ? Vivelle-Dot [Estradiol]   ?  Adhesive on patch  ? Aleve [Naproxen Sodium]   ?  swelling  ? Mirapex [Pramipexole] Other (See Comments)  ?  Nightmares  ? Propylene Glycol   ?  Other reaction(s): rashes  ? Adhesive  [Tape] Rash  ? Ibuprofen   ? ? ?HOME MEDICATIONS: ?Outpatient Medications Prior to Visit  ?Medication Sig Dispense Refill  ? Cholecalciferol (VITAMIN D3) 2000 UNITS TABS Take 2,000 Units by mouth daily.    ? cyanocobalamin (,VITAMIN B-12,) 1000 MCG/ML injection INJECT 1 ML INTRAMUSCULARLY EVERY 30 DAYS AS DIRECTED 10 mL 1  ? Ferrous Sulfate (IRON) 325 (65 Fe) MG TABS Take 1 tablet by mouth every other day.    ? HYDROcodone-acetaminophen (NORCO/VICODIN) 5-325 MG tablet  Take 1 tablet by mouth every 6 (six) hours as needed. Must last 28 days. 30 tablet 0  ? Pediatric Multivit-Minerals-C (MULTIVITAMIN GUMMIES CHILDRENS) CHEW Chew 1 tablet by mouth daily.    ? SUMAtriptan (IMITREX) 100 MG tablet Take 1 tablet (100 mg total) by mouth 2 (two) times daily as needed for up to 1 dose for migraine. 10 tablet 2  ? SUMAtriptan (IMITREX) 6 MG/0.5ML SOLN injection Inject 0.5 mLs (6 mg total) into the skin 2 (two)  times daily as needed for migraine or headache. 5 mL 3  ? carbamazepine (CARBATROL) 200 MG 12 hr capsule TAKE ONE CAPSULE BY MOUTH TWICE A DAY 60 capsule 5  ? gabapentin (NEURONTIN) 300 MG capsule TAKE 1 CAPSULE BY MOUTH AT 5PM AND TAKE TAKE TWO CAPSULES BY MOUTH EVERY NIGHT AT BEDTIME 90 capsule 6  ? levothyroxine (SYNTHROID, LEVOTHROID) 75 MCG tablet Take 75 mcg by mouth daily before breakfast.    ? pramipexole (MIRAPEX) 0.25 MG tablet Take 1 tablet (0.25 mg total) by mouth at bedtime. 30 tablet 3  ? ?No facility-administered medications prior to visit.  ? ? ?PAST MEDICAL HISTORY: ?Past Medical History:  ?Diagnosis Date  ? Ankle fracture   ? Ankle fracture, left   ? Glaucoma   ? narrow angle  ? Migraines   ? Myeloperoxidase deficiency (HCC)   ? Periodic limb movement disorder 04/08/2015  ? Peripheral neuropathy   ? Right  ? Raynaud disease 04/24/2013  ? Vitamin B12 deficiency   ? ? ?PAST SURGICAL HISTORY: ?Past Surgical History:  ?Procedure Laterality Date  ? dental implants    ? x3  ? gastrocsoleus biopsy    ? KNEE SURGERY Left   ? lip biopsy    ? SURAL NERVE BIOPSY    ? ? ?FAMILY HISTORY: ?Family History  ?Problem Relation Age of Onset  ? Migraines Mother   ? Cancer Mother   ? Aneurysm Father   ? Parkinsonism Maternal Grandfather   ? Cancer Maternal Grandfather   ? Heart disease Maternal Grandfather   ? Heart disease Paternal Grandfather   ? Stroke Paternal Grandfather   ? ? ?SOCIAL HISTORY: ?Social History  ? ?Socioeconomic History  ? Marital status: Married  ?  Spouse name: Ed  ? Number of children: 0  ? Years of education: 72  ? Highest education level: Not on file  ?Occupational History  ? Occupation: Print production planner  ?Tobacco Use  ? Smoking status: Never  ? Smokeless tobacco: Never  ?Substance and Sexual Activity  ? Alcohol use: Yes  ?  Alcohol/week: 0.0 standard drinks  ?  Comment: occasionally  ? Drug use: No  ? Sexual activity: Not on file  ?Other Topics Concern  ? Not on file  ?Social History Narrative  ?  Lives at home, married  ? Patient drinks about 2 cups of caffeine daily.  ? Patient is right handed.  ? ?Social Determinants of Health  ? ?Financial Resource Strain: Not on file  ?Food Insecurity: Not on file  ?Transportation Needs: Not on file  ?Physical Activity: Not on file  ?Stress: Not on file  ?Social Connections: Not on file  ?Intimate Partner Violence: Not on file  ? ? ?PHYSICAL EXAM ? ?Vitals:  ? 11/23/21 1335  ?BP: 128/86  ?Pulse: 63  ?Weight: 157 lb (71.2 kg)  ?Height: 5\' 6"  (1.676 m)  ? ?Body mass index is 25.34 kg/m?. ? ?Generalized: Well developed, in no acute distress  ?Neurological examination  ?Mentation: Alert oriented to  time, place, history taking. Follows all commands speech and language fluent ?Cranial nerve II-XII: Pupils were equal round reactive to light. Extraocular movements were full, visual field were full on confrontational test. Facial sensation and strength were normal. Head turning and shoulder shrug  were normal and symmetric. ?Motor: The motor testing reveals 5 over 5 strength of all 4 extremities. Good symmetric motor tone is noted throughout.  ?Sensory: Length dependent sensory deficit to soft touch bilateral lower extremities ?Coordination: Cerebellar testing reveals good finger-nose-finger and heel-to-shin bilaterally.  ?Gait and station: Gait is normal. Tandem gait is normal.  Able to walk on heels and tiptoe ?Reflexes: Deep tendon reflexes are symmetric and normal bilaterally.  ? ?DIAGNOSTIC DATA (LABS, IMAGING, TESTING) ?- I reviewed patient records, labs, notes, testing and imaging myself where available. ? ?Lab Results  ?Component Value Date  ? WBC 6.4 06/14/2021  ? HGB 15.0 06/14/2021  ? HCT 44.7 06/14/2021  ? MCV 92 06/14/2021  ? PLT 331 06/14/2021  ? ?   ?Component Value Date/Time  ? NA 139 06/14/2021 1044  ? K 4.6 06/14/2021 1044  ? CL 99 06/14/2021 1044  ? CO2 25 06/14/2021 1044  ? GLUCOSE 91 06/14/2021 1044  ? GLUCOSE 98 10/28/2008 1454  ? BUN 12 06/14/2021 1044  ?  CREATININE 0.86 06/14/2021 1044  ? CALCIUM 9.8 06/14/2021 1044  ? PROT 7.5 06/14/2021 1044  ? ALBUMIN 4.9 06/14/2021 1044  ? AST 41 (H) 06/14/2021 1044  ? ALT 31 06/14/2021 1044  ? ALKPHOS 89 06/14/2021 104

## 2021-12-06 NOTE — Progress Notes (Signed)
Chart reviewed, agree above plan ?

## 2021-12-08 ENCOUNTER — Emergency Department (HOSPITAL_COMMUNITY): Payer: 59

## 2021-12-08 ENCOUNTER — Emergency Department (HOSPITAL_COMMUNITY)
Admission: EM | Admit: 2021-12-08 | Discharge: 2021-12-08 | Disposition: A | Discharge status: Home / Self Care | Payer: 59 | Attending: Emergency Medicine | Admitting: Emergency Medicine

## 2021-12-08 ENCOUNTER — Other Ambulatory Visit: Payer: Self-pay

## 2021-12-08 ENCOUNTER — Encounter (HOSPITAL_COMMUNITY): Payer: Self-pay | Admitting: Emergency Medicine

## 2021-12-08 DIAGNOSIS — D72829 Elevated white blood cell count, unspecified: Secondary | ICD-10-CM | POA: Diagnosis not present

## 2021-12-08 DIAGNOSIS — R55 Syncope and collapse: Secondary | ICD-10-CM | POA: Diagnosis not present

## 2021-12-08 DIAGNOSIS — R197 Diarrhea, unspecified: Secondary | ICD-10-CM | POA: Insufficient documentation

## 2021-12-08 DIAGNOSIS — R109 Unspecified abdominal pain: Secondary | ICD-10-CM | POA: Insufficient documentation

## 2021-12-08 DIAGNOSIS — R112 Nausea with vomiting, unspecified: Secondary | ICD-10-CM | POA: Diagnosis present

## 2021-12-08 DIAGNOSIS — I959 Hypotension, unspecified: Secondary | ICD-10-CM | POA: Diagnosis not present

## 2021-12-08 LAB — COMPREHENSIVE METABOLIC PANEL
ALT: 22 U/L (ref 0–44)
AST: 29 U/L (ref 15–41)
Albumin: 3.5 g/dL (ref 3.5–5.0)
Alkaline Phosphatase: 60 U/L (ref 38–126)
Anion gap: 9 (ref 5–15)
BUN: 18 mg/dL (ref 6–20)
CO2: 24 mmol/L (ref 22–32)
Calcium: 8.3 mg/dL — ABNORMAL LOW (ref 8.9–10.3)
Chloride: 107 mmol/L (ref 98–111)
Creatinine, Ser: 0.82 mg/dL (ref 0.44–1.00)
GFR, Estimated: >60 mL/min (ref >60)
Glucose, Bld: 121 mg/dL — ABNORMAL HIGH (ref 70–99)
Potassium: 3.8 mmol/L (ref 3.5–5.1)
Sodium: 140 mmol/L (ref 135–145)
Total Bilirubin: 0.4 mg/dL (ref 0.3–1.2)
Total Protein: 6.3 g/dL — ABNORMAL LOW (ref 6.5–8.1)

## 2021-12-08 LAB — CBC WITH DIFFERENTIAL/PLATELET
Abs Immature Granulocytes: 0.11 10*3/uL — ABNORMAL HIGH (ref 0.00–0.07)
Basophils Absolute: 0.1 10*3/uL (ref 0.0–0.1)
Basophils Relative: 0 %
Eosinophils Absolute: 0.2 10*3/uL (ref 0.0–0.5)
Eosinophils Relative: 1 %
HCT: 41.4 % (ref 36.0–46.0)
Hemoglobin: 13.9 g/dL (ref 12.0–15.0)
Immature Granulocytes: 1 %
Lymphocytes Relative: 5 %
Lymphs Abs: 0.8 10*3/uL (ref 0.7–4.0)
MCH: 32.6 pg (ref 26.0–34.0)
MCHC: 33.6 g/dL (ref 30.0–36.0)
MCV: 97.2 fL (ref 80.0–100.0)
Monocytes Absolute: 1.4 10*3/uL — ABNORMAL HIGH (ref 0.1–1.0)
Monocytes Relative: 8 %
Neutro Abs: 15.4 10*3/uL — ABNORMAL HIGH (ref 1.7–7.7)
Neutrophils Relative %: 85 %
Platelets: 295 10*3/uL (ref 150–400)
RBC: 4.26 MIL/uL (ref 3.87–5.11)
RDW: 12.1 % (ref 11.5–15.5)
WBC: 18 10*3/uL — ABNORMAL HIGH (ref 4.0–10.5)
nRBC: 0 % (ref 0.0–0.2)

## 2021-12-08 LAB — URINALYSIS, ROUTINE W REFLEX MICROSCOPIC
Bacteria, UA: NONE SEEN
Bilirubin Urine: NEGATIVE
Glucose, UA: NEGATIVE mg/dL
Hgb urine dipstick: NEGATIVE
Ketones, ur: 5 mg/dL — AB
Nitrite: NEGATIVE
Protein, ur: NEGATIVE mg/dL
Specific Gravity, Urine: 1.006 (ref 1.005–1.030)
pH: 8 (ref 5.0–8.0)

## 2021-12-08 LAB — TROPONIN I (HIGH SENSITIVITY)
Troponin I (High Sensitivity): 4 ng/L (ref <18)
Troponin I (High Sensitivity): 6 ng/L (ref <18)

## 2021-12-08 LAB — I-STAT CHEM 8, ED
BUN: 17 mg/dL (ref 6–20)
Calcium, Ion: 1.11 mmol/L — ABNORMAL LOW (ref 1.15–1.40)
Chloride: 106 mmol/L (ref 98–111)
Creatinine, Ser: 0.8 mg/dL (ref 0.44–1.00)
Glucose, Bld: 117 mg/dL — ABNORMAL HIGH (ref 70–99)
HCT: 40 % (ref 36.0–46.0)
Hemoglobin: 13.6 g/dL (ref 12.0–15.0)
Potassium: 3.8 mmol/L (ref 3.5–5.1)
Sodium: 142 mmol/L (ref 135–145)
TCO2: 26 mmol/L (ref 22–32)

## 2021-12-08 MED ORDER — SODIUM CHLORIDE 0.9 % IV SOLN: INTRAVENOUS | Status: DC

## 2021-12-08 MED ORDER — SODIUM CHLORIDE 0.9 % IV BOLUS
500.0000 mL | Freq: Once | INTRAVENOUS | Status: AC
Start: 2021-12-08 — End: 2021-12-08
  Administered 2021-12-08: 500 mL via INTRAVENOUS

## 2021-12-08 MED ORDER — ONDANSETRON HCL 4 MG/2ML IJ SOLN
4.0000 mg | Freq: Once | INTRAMUSCULAR | Status: AC
  Administered 2021-12-08: 4 mg via INTRAVENOUS
  Filled 2021-12-08: qty 2

## 2021-12-08 NOTE — ED Provider Notes (Signed)
?[facility]-EMERGENCY DEPT ?Provider Note ? ? ?CSN: [id number] ?Arrival date & time: 12/08/21  0449 ? ?  ? ?History ? ?Chief Complaint  ?Patient presents with  ? Hypotension  ? ? ?[name] is a 61 y.o. female. ? ?The history is provided by the patient and the EMS personnel.  ?Loss of Consciousness ?Episode history:  Single ?Most recent episode:  Today ?Timing:  Rare ?Progression:  Resolved ?Chronicity:  New ?Context comment:  Multiple bouts of vomiting and a diarrheal stool ?Relieved by:  Nothing ?Worsened by:  Nothing ?Ineffective treatments:  None tried ?Associated symptoms: nausea and vomiting   ?Associated symptoms: no chest pain, no fever, no palpitations, no rectal bleeding and no shortness of breath   ?Associated symptoms comment:  One diarrheal stool  ?Patient with neuropathy with sudden onset vomiting and one diarrheal stool with syncope and hypotension per ems.  No f/c/r.  No chest pain, no SOB.   ?  ? ?Home Medications ?Prior to Admission medications   ?Medication Sig Start Date End Date Taking? Authorizing Provider  ?carbamazepine (CARBATROL) 200 MG 12 hr capsule Take 1 capsule (200 mg total) by mouth 2 (two) times daily. 11/23/21   Glean Salvo, NP  ?Cholecalciferol (VITAMIN D3) 2000 UNITS TABS Take 2,000 Units by mouth daily.    [provider]  ?cyanocobalamin (,VITAMIN B-12,) 1000 MCG/ML injection INJECT 1 ML INTRAMUSCULARLY EVERY 30 DAYS AS DIRECTED 06/24/19   Glean Salvo, NP  ?Ferrous Sulfate (IRON) 325 (65 Fe) MG TABS Take 1 tablet by mouth every other day.    [provider]  ?gabapentin (NEURONTIN) 300 MG capsule TAKE 1 CAPSULE BY MOUTH AT 5PM AND TAKE TAKE TWO CAPSULES BY MOUTH EVERY NIGHT AT BEDTIME 11/23/21   Glean Salvo, NP  ?HYDROcodone-acetaminophen (NORCO/VICODIN) 5-325 MG tablet Take 1 tablet by mouth every 6 (six) hours as needed. Must last 28 days. 06/14/21   York Spaniel, MD  ?Pediatric Multivit-Minerals-C (MULTIVITAMIN  GUMMIES CHILDRENS) CHEW Chew 1 tablet by mouth daily.    [provider]  ?SUMAtriptan (IMITREX) 100 MG tablet Take 1 tablet (100 mg total) by mouth 2 (two) times daily as needed for up to 1 dose for migraine. 07/07/20   Glean Salvo, NP  ?SUMAtriptan (IMITREX) 6 MG/0.5ML SOLN injection Inject 0.5 mLs (6 mg total) into the skin 2 (two) times daily as needed for migraine or headache. 07/07/20   Glean Salvo, NP  ?   ? ?Allergies    ?Doxycycline hyclate, Lyrica [pregabalin], Vivelle-dot [estradiol], Aleve [naproxen sodium], Mirapex [pramipexole], Propylene glycol, Adhesive  [tape], and Ibuprofen   ? ?Review of Systems   ?Review of Systems  ?Constitutional:  Negative for fever.  ?HENT:  Negative for facial swelling.   ?Eyes:  Negative for redness.  ?Respiratory:  Negative for shortness of breath.   ?Cardiovascular:  Positive for syncope. Negative for chest pain and palpitations.  ?Gastrointestinal:  Positive for diarrhea, nausea and vomiting.  ?Genitourinary:  Negative for dysuria.  ?Neurological:  Positive for syncope.  ? ?Physical Exam ?Updated Vital Signs ?BP 104/76   Pulse 85   Temp (!) 96.6 ?F (35.9 ?C) (Rectal)   Resp 10   Ht 5\' 6"  (1.676 m)   Wt 71.2 kg   SpO2 100%   BMI 25.34 kg/m?  ?Physical Exam ?Vitals and nursing note reviewed. Exam conducted with a chaperone present.  ?Constitutional:   ?   Appearance: Normal appearance. She is not diaphoretic.  ?HENT:  ?  Head: Normocephalic and atraumatic.  ?   Nose: Nose normal.  ?   Mouth/Throat:  ?   Mouth: Mucous membranes are moist.  ?Eyes:  ?   Conjunctiva/sclera: Conjunctivae normal.  ?   Pupils: Pupils are equal, round, and reactive to light.  ?Cardiovascular:  ?   Rate and Rhythm: Normal rate and regular rhythm.  ?   Pulses: Normal pulses.  ?   Heart sounds: Normal heart sounds.  ?Pulmonary:  ?   Effort: Pulmonary effort is normal.  ?   Breath sounds: Normal breath sounds.  ?Abdominal:  ?   General: Abdomen is flat. Bowel sounds are normal.  ?    Palpations: Abdomen is soft.  ?   Tenderness: There is no abdominal tenderness. There is no rebound.  ?Musculoskeletal:     ?   General: Normal range of motion.  ?   Cervical back: Normal range of motion and neck supple.  ?   Right lower leg: No edema.  ?   Left lower leg: No edema.  ?Skin: ?   General: Skin is warm and dry.  ?   Capillary Refill: Capillary refill takes less than 2 seconds.  ?Neurological:  ?   General: No focal deficit present.  ?   Mental Status: She is alert and oriented to person, place, and time.  ?   Deep Tendon Reflexes: Reflexes normal.  ?Psychiatric:     ?   Mood and Affect: Mood normal.     ?   Behavior: Behavior normal.  ? ? ?ED Results / Procedures / Treatments   ?Labs ?(all labs ordered are listed, but only abnormal results are displayed) ?Results for orders placed or performed during the hospital encounter of 12/08/21  ?CBC with Differential/Platelet  ?Result Value Ref Range  ? WBC 18.0 (H) 4.0 - 10.5 K/uL  ? RBC 4.26 3.87 - 5.11 MIL/uL  ? Hemoglobin 13.9 12.0 - 15.0 g/dL  ? HCT 41.4 36.0 - 46.0 %  ? MCV 97.2 80.0 - 100.0 fL  ? MCH 32.6 26.0 - 34.0 pg  ? MCHC 33.6 30.0 - 36.0 g/dL  ? RDW 12.1 11.5 - 15.5 %  ? Platelets 295 150 - 400 K/uL  ? nRBC 0.0 0.0 - 0.2 %  ? Neutrophils Relative % 85 %  ? Neutro Abs 15.4 (H) 1.7 - 7.7 K/uL  ? Lymphocytes Relative 5 %  ? Lymphs Abs 0.8 0.7 - 4.0 K/uL  ? Monocytes Relative 8 %  ? Monocytes Absolute 1.4 (H) 0.1 - 1.0 K/uL  ? Eosinophils Relative 1 %  ? Eosinophils Absolute 0.2 0.0 - 0.5 K/uL  ? Basophils Relative 0 %  ? Basophils Absolute 0.1 0.0 - 0.1 K/uL  ? Immature Granulocytes 1 %  ? Abs Immature Granulocytes 0.11 (H) 0.00 - 0.07 K/uL  ?Comprehensive metabolic panel  ?Result Value Ref Range  ? Sodium 140 135 - 145 mmol/L  ? Potassium 3.8 3.5 - 5.1 mmol/L  ? Chloride 107 98 - 111 mmol/L  ? CO2 24 22 - 32 mmol/L  ? Glucose, Bld 121 (H) 70 - 99 mg/dL  ? BUN 18 6 - 20 mg/dL  ? Creatinine, Ser 0.82 0.44 - 1.00 mg/dL  ? Calcium 8.3 (L) 8.9 - 10.3  mg/dL  ? Total Protein 6.3 (L) 6.5 - 8.1 g/dL  ? Albumin 3.5 3.5 - 5.0 g/dL  ? AST 29 15 - 41 U/L  ? ALT 22 0 - 44 U/L  ? Alkaline Phosphatase 60  38 - 126 U/L  ? Total Bilirubin 0.4 0.3 - 1.2 mg/dL  ? GFR, Estimated >60 >60 mL/min  ? Anion gap 9 5 - 15  ?I-stat chem 8, ED (not at [facility] or [facility])  ?Result Value Ref Range  ? Sodium 142 135 - 145 mmol/L  ? Potassium 3.8 3.5 - 5.1 mmol/L  ? Chloride 106 98 - 111 mmol/L  ? BUN 17 6 - 20 mg/dL  ? Creatinine, Ser 0.80 0.44 - 1.00 mg/dL  ? Glucose, Bld 117 (H) 70 - 99 mg/dL  ? Calcium, Ion 1.11 (L) 1.15 - 1.40 mmol/L  ? TCO2 26 22 - 32 mmol/L  ? Hemoglobin 13.6 12.0 - 15.0 g/dL  ? HCT 40.0 36.0 - 46.0 %  ?Troponin I (High Sensitivity)  ?Result Value Ref Range  ? Troponin I (High Sensitivity) 4 <18 ng/L  ? ?DG Chest Portable 1 View ? ?Result Date: 12/08/2021 ?CLINICAL DATA:  Syncope, vomiting and diarrhea. EXAM: PORTABLE CHEST 1 VIEW COMPARISON:  PA Lat 12/13/2008 FINDINGS: The heart size and mediastinal contours are within normal limits. Both lungs are clear. The visualized skeletal structures are unremarkable. IMPRESSION: No evidence of acute chest disease or interval changes. Stable chest. Electronically Signed   By: Almira Bar M.D.   On: 12/08/2021 05:44   ? ?EKG ?None ? ?Radiology ?DG Chest Portable 1 View ? ?Result Date: 12/08/2021 ?CLINICAL DATA:  Syncope, vomiting and diarrhea. EXAM: PORTABLE CHEST 1 VIEW COMPARISON:  PA Lat 12/13/2008 FINDINGS: The heart size and mediastinal contours are within normal limits. Both lungs are clear. The visualized skeletal structures are unremarkable. IMPRESSION: No evidence of acute chest disease or interval changes. Stable chest. Electronically Signed   By: Almira Bar M.D.   On: 12/08/2021 05:44   ? ?Procedures ?Procedures  ? ? ?Medications Ordered in ED ?Medications  ?sodium chloride 0.9 % bolus 500 mL (500 mLs Intravenous New Bag/Given 12/08/21 0510)  ? ? ?ED Course/ Medical Decision Making/ A&P ?  ?                        ?Medical  Decision Making ?Patient with nausea and vomiting and diarrhea and syncope x 1  ? ?Amount and/or Complexity of Data Reviewed ?Independent Historian: EMS ?   Details: see above ?External Data Reviewed: notes. ?

## 2021-12-08 NOTE — ED Notes (Signed)
Patient transported to CT 

## 2021-12-08 NOTE — Discharge Instructions (Signed)
We suspect that your fainting was due to a neurocardiogenic or vasovagal event. ? ?Given the diarrhea -recommendation is to make sure that you hydrate well.  You can take nighttime loperamide for diarrhea. ? ?Return to the ER if you start having fevers, bloody stools, intractable nausea or vomiting.  Also return to the ER if you have persistent dizziness or another fainting spell. ?

## 2021-12-08 NOTE — ED Provider Notes (Signed)
?  Physical Exam  ?BP 103/72   Pulse 80   Temp (!) 96.8 ?F (36 ?C) (Rectal)   Resp 13   Ht 5\' 6"  (1.676 m)   Wt 71.2 kg   SpO2 99%   BMI 25.34 kg/m?  ? ?Physical Exam ? ?Procedures  ?Procedures ? ?ED Course / MDM  ?  ?Medical Decision Making ?Amount and/or Complexity of Data Reviewed ?Labs: ordered. ?Radiology: ordered. ? ?Risk ?Prescription drug management. ? ? ?Assuming care of patient from Dr. Randal Buba ?  ?Patient in the ED for syncope, n/v. ?Workup thus far shows elevated WC ? ?Concerning findings are as following :  ?Important pending results are Orthostatics and CT abd + Urine Analysis ? ?According to Dr. Randal Buba, plan is to reasses  ? ?Patient had no complains, no concerns from the nursing side. Will continue to monitor. ? ?10:35 AM ?Patient reassessed.  She indicates that she has been feeling well since arriving to the ER.  Husband at the bedside, indicates that patient's color looks a lot better 2. ? ?It appears that she woke up feeling nauseated and having to go to the bathroom to defecate.  She was nauseous while in the bathroom, got dizzy, sweaty and then fainted.  No seizure-like activity.  Denies any focal neuro complaints.  Denies any chest pain, palpitations.  There is no concerning cardiac history. ? ?Her white count was elevated, CT scan was completed, which is negative for any acute findings.  Patient has no abdominal tenderness. ? ?Hemodynamically she has been stable and within normal limits as far as BP is concerned.  Stable for discharge at this time.  Patient and her husband are comfortable with the plan. ? ?Strict ER return precautions have been discussed, and patient is agreeing with the plan and is comfortable with the workup done and the recommendations from the ER. ? ? ? ? ?  ?Varney Biles, MD ?12/08/21 1037 ? ?

## 2021-12-11 ENCOUNTER — Encounter: Payer: Self-pay | Admitting: Neurology

## 2021-12-13 ENCOUNTER — Ambulatory Visit: Payer: 59 | Admitting: Neurology

## 2022-02-09 ENCOUNTER — Ambulatory Visit
Admission: RE | Admit: 2022-02-09 | Discharge: 2022-02-09 | Disposition: A | Discharge status: Home / Self Care | Payer: 59 | Source: Ambulatory Visit | Attending: Gynecology | Admitting: Gynecology

## 2022-02-09 DIAGNOSIS — Z13820 Encounter for screening for osteoporosis: Secondary | ICD-10-CM

## 2022-04-18 IMAGING — DX DG CHEST 1V PORT
1 series · 2 of 2 positions shown · non-contrast
Comparison: PA Lat 12/13/2008

CLINICAL DATA: Syncope, vomiting and diarrhea.

EXAM:
PORTABLE CHEST 1 VIEW

[Series 1: chest ap · 0.14mm/px · 2 of 2 slices shown]
[im 1/2]
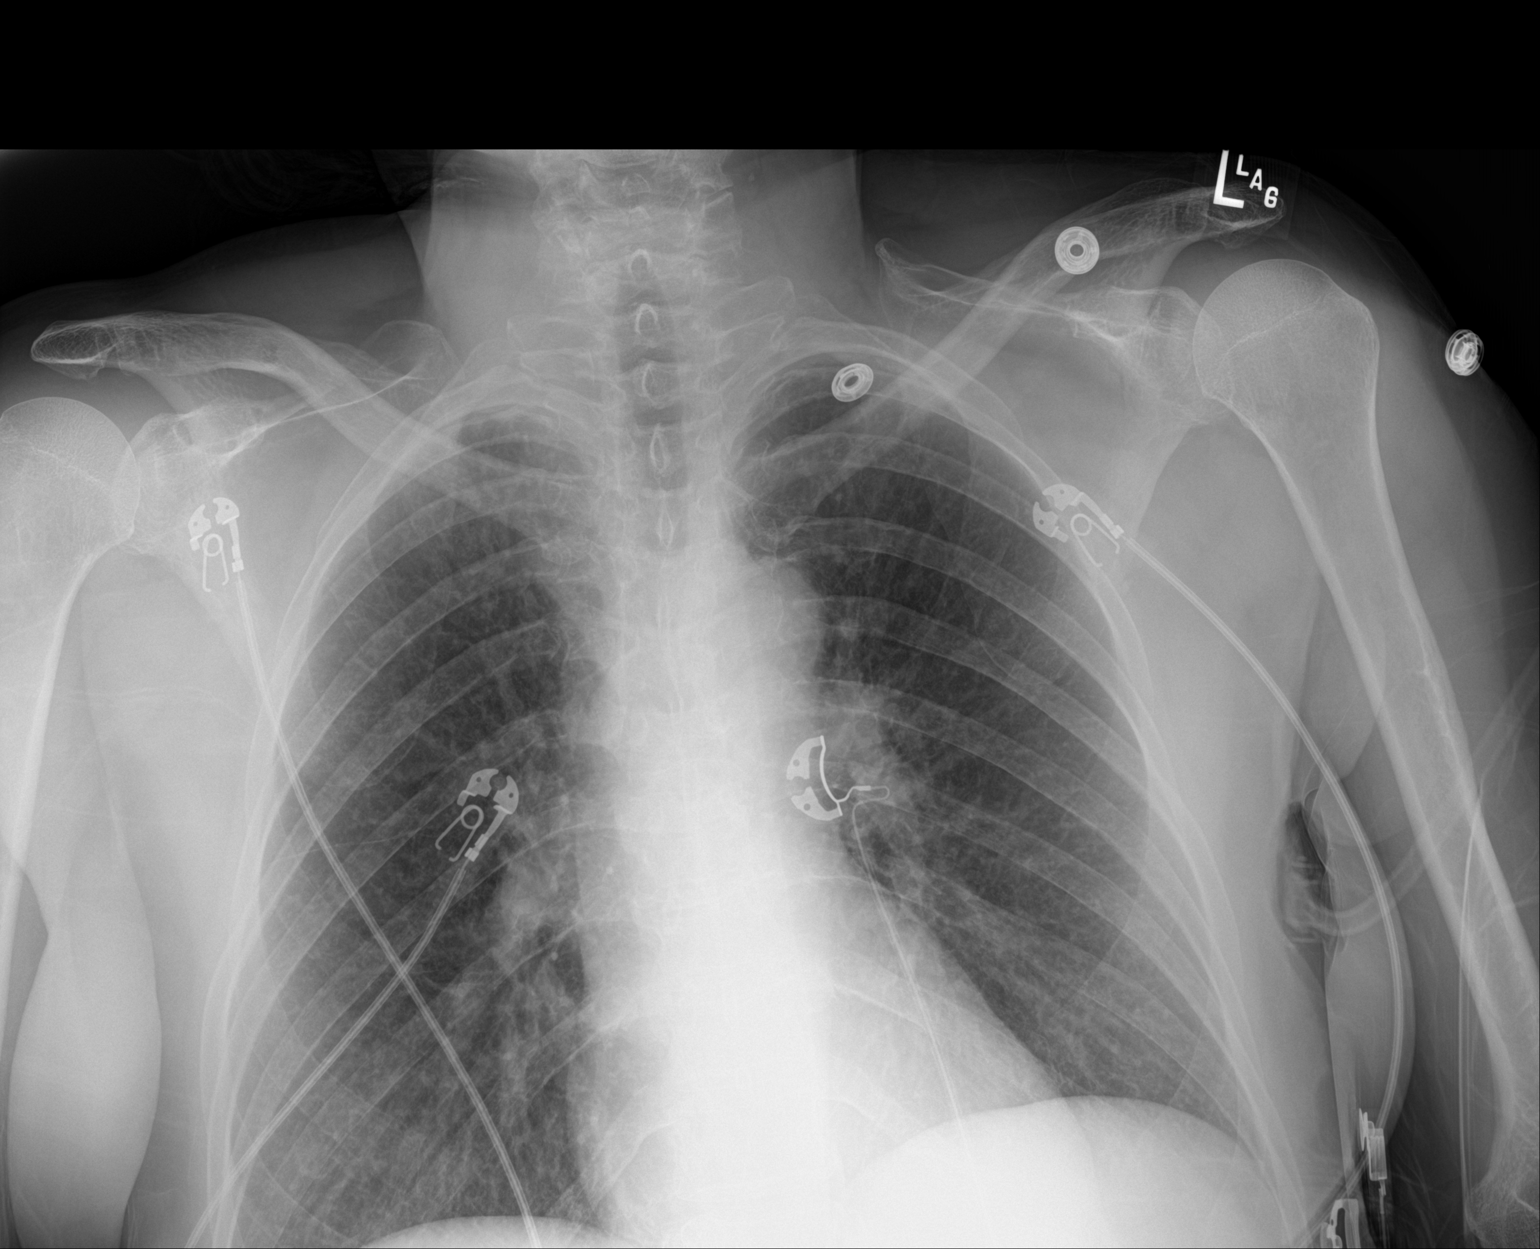
[im 2/2]
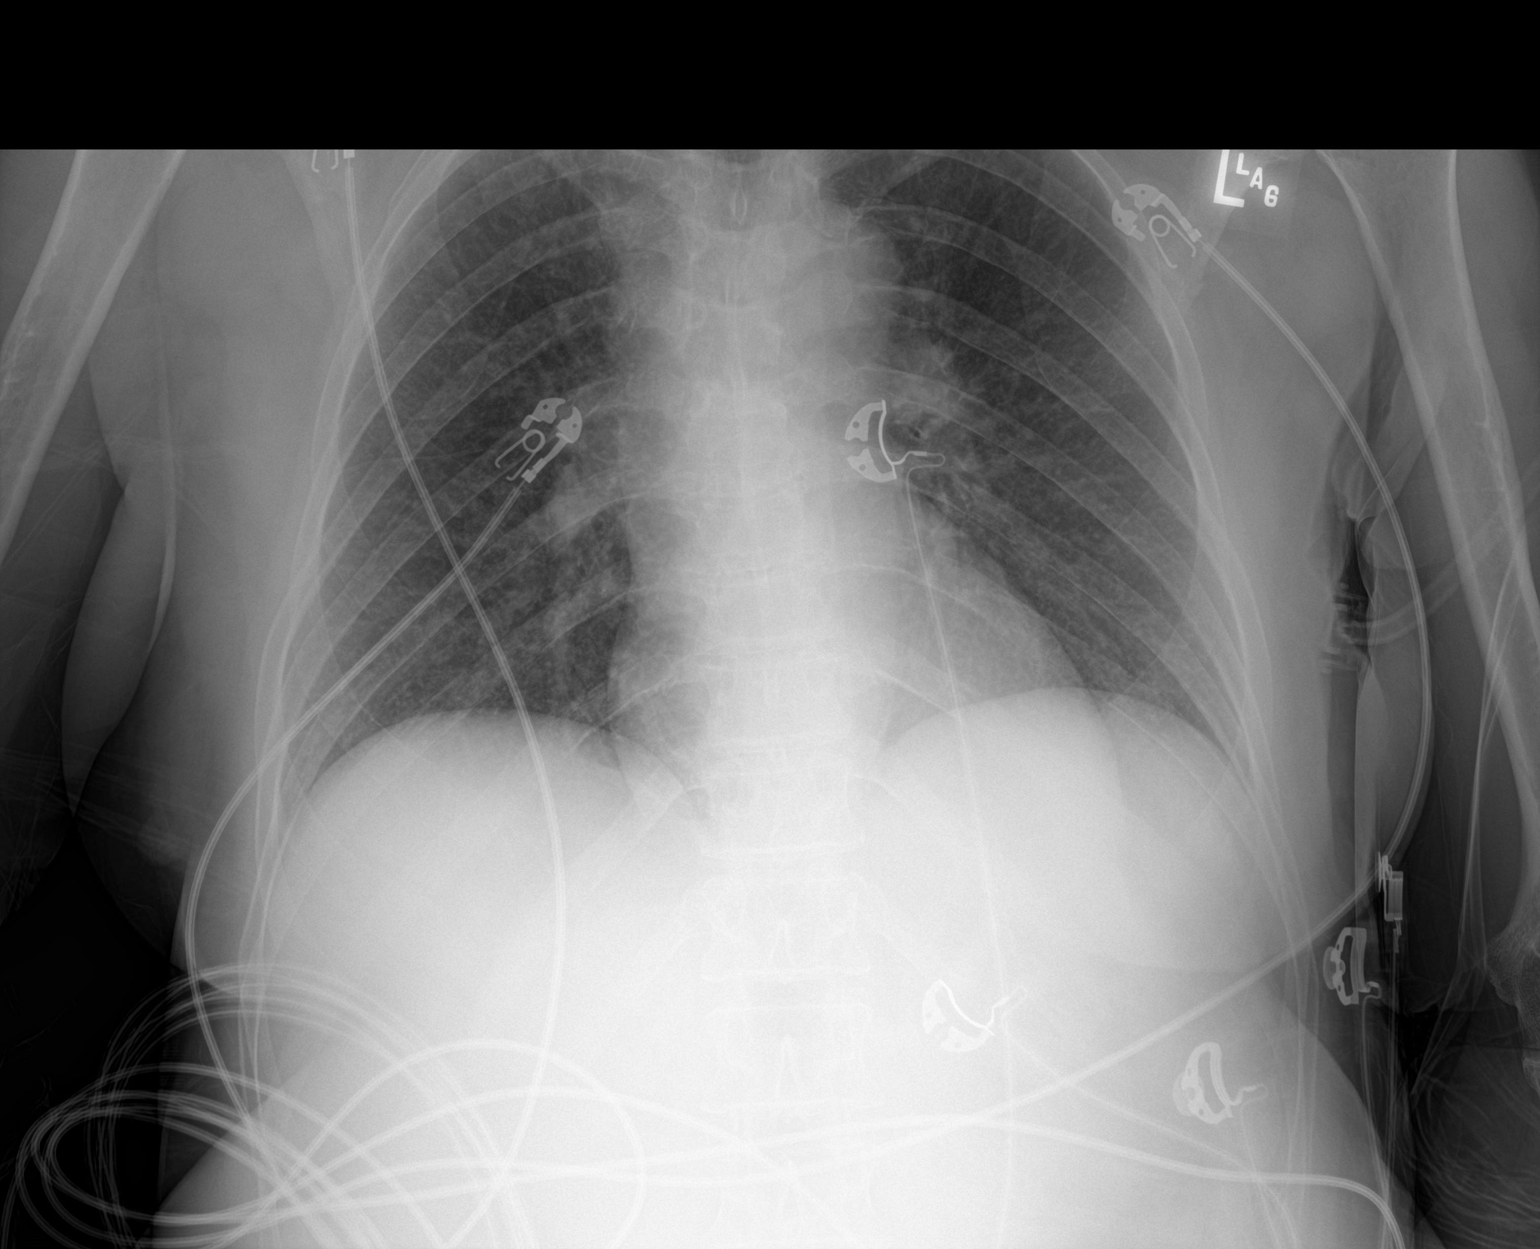

[2 of 2 positions shown; findings below may reference images not displayed]

FINDINGS: The heart size and mediastinal contours are within normal limits.
Both lungs are clear. The visualized skeletal structures are
unremarkable.
IMPRESSION: No evidence of acute chest disease or interval changes. Stable
chest.

## 2022-04-25 ENCOUNTER — Ambulatory Visit: Payer: 59 | Admitting: Neurology

## 2022-11-28 NOTE — Progress Notes (Unsigned)
PATIENT: [name] DOB: November 16, 1960  REASON FOR VISIT: Follow up for peripheral neuropathy, migraine headache, periodic limb movement disorder HISTORY FROM: Patient PRIMARY NEUROLOGIST: Dr.Yan   ASSESSMENT AND PLAN 62 y.o. year old female   1.  Peripheral neuropathy 2.  Migraine headache 3.  Periodic limb movement disorder  -Will try to reduce carbamazepine 12 hr capsule to once daily, if symptoms do not worsen, may try to stop altogether, unclear benefit -Continue gabapentin up to 300 mg, 1 at 5 PM, 2 at bedtime -Has Imitrex if needed for migraine -PCP follows labs -She may be experiencing left-sided meralgia paresthetica, will continue to monitor, avoid tight waistbands/pressure over the nerve, she does not want any further workup, denies diabetes -Follow-up in 1 year or sooner if needed  HISTORY OF PRESENT ILLNESS: Today 11/29/22 In ER April 2023 for vasovagal syncope. Neuropathy is about the same, has some numbness to left lateral thigh, about size of hand. Is not painful. Balance is okay. Has burning type pains to legs. Really not sure if carbamazepine/gabapentin is helping. Most bothersome at night. Right now takes carbamazepine 200 mg AM, sometimes doesn't taken PM dose, gabapentin 300 mg BID, can't function at work. Still on B12 shots once a month. Migraines are rare, every 6 months. Wakes up with them. Uses Imitrex tablet, will take shot if vomiting. PCP checked labs in Jan, reports were okay.   Update 11/23/2021 SS: [name] here today for follow-up.  At last visit carbamazepine level was 6.4, no anemia, ferritin was slightly increased at 209, B12 was 778. Takes Carbatrol 200 mg twice daily, 300 mg PM, 600 mg at bedtime. Hydrocodone was filled by Dr. Jannifer Franklin in October 2022. Rarely takes hydrocodone. Migraines are every 2-3 months, has Imitrex tablet and injection as needed, works great. Has RLS, in past Mirapex caused nightmares. Had colonoscopy, benign polyp. Her mom  has colon cancer, having resection soon. Having thyroid checked tomorrow.   HISTORY  06/14/2021 Dr. Jannifer Franklin: [name] is a 62 year old right-handed white female with a history of a peripheral neuropathy.  The patient has a history of a low B12 level in the past, she is on B12 supplementation.  The patient continues to have discomfort throughout the day with her legs with burning sensations and cold sensations.  She feels more discomfort when she is sitting.  She is on Carbatrol taking 200 mg twice daily, she is unable to tolerate gabapentin at any dose during the daytime, she will take a 300 mg capsule around 5 PM when she gets off work and then at that time.  The patient is having increased problems with periodic limb movement disorder, she is not resting well at night and is fatigued during the day.  She reports no severe issues with balance.  She has occasional headaches, she may only have 1 headache every 3 to 4 months, she takes Imitrex and may use some hydrocodone as a backup medication if the Imitrex does not work.  She returns to the office today for an evaluation.   REVIEW OF SYSTEMS: Out of a complete 14 system review of symptoms, the patient complains only of the following symptoms, and all other reviewed systems are negative.  See HPI  ALLERGIES: Allergies  Allergen Reactions   Doxycycline Hyclate    Lyrica [Pregabalin]    Vivelle-Dot [Estradiol]     Adhesive on patch   Aleve [Naproxen Sodium]     swelling   Mirapex [Pramipexole] Other (See Comments)  Nightmares   Propylene Glycol     Other reaction(s): rashes   Adhesive  [Tape] Rash   Ibuprofen     HOME MEDICATIONS: Outpatient Medications Prior to Visit  Medication Sig Dispense Refill   Cholecalciferol (VITAMIN D3) 2000 UNITS TABS Take 2,000 Units by mouth daily.     cyanocobalamin (,VITAMIN B-12,) 1000 MCG/ML injection INJECT 1 ML INTRAMUSCULARLY EVERY 30 DAYS AS DIRECTED 10 mL 1   Ferrous Sulfate (IRON) 325 (65 Fe)  MG TABS Take 1 tablet by mouth every other day.     levothyroxine (SYNTHROID) 100 MCG tablet 100 mcg.     Pediatric Multivit-Minerals-C (MULTIVITAMIN GUMMIES CHILDRENS) CHEW Chew 1 tablet by mouth daily.     SUMAtriptan (IMITREX) 100 MG tablet Take 1 tablet (100 mg total) by mouth 2 (two) times daily as needed for up to 1 dose for migraine. 10 tablet 2   SUMAtriptan (IMITREX) 6 MG/0.5ML SOLN injection Inject 0.5 mLs (6 mg total) into the skin 2 (two) times daily as needed for migraine or headache. 5 mL 3   carbamazepine (CARBATROL) 200 MG 12 hr capsule Take 1 capsule (200 mg total) by mouth 2 (two) times daily. 180 capsule 3   gabapentin (NEURONTIN) 300 MG capsule TAKE 1 CAPSULE BY MOUTH AT 5PM AND TAKE TAKE TWO CAPSULES BY MOUTH EVERY NIGHT AT BEDTIME 270 capsule 3   HYDROcodone-acetaminophen (NORCO/VICODIN) 5-325 MG tablet Take 1 tablet by mouth every 6 (six) hours as needed. Must last 28 days. 30 tablet 0   No facility-administered medications prior to visit.    PAST MEDICAL HISTORY: Past Medical History:  Diagnosis Date   Ankle fracture    Ankle fracture, left    Glaucoma    narrow angle   Migraines    Myeloperoxidase deficiency (HCC)    Periodic limb movement disorder 04/08/2015   Peripheral neuropathy    Right   Raynaud disease 04/24/2013   Vitamin B12 deficiency     PAST SURGICAL HISTORY: Past Surgical History:  Procedure Laterality Date   dental implants     x3   gastrocsoleus biopsy     KNEE SURGERY Left    lip biopsy     SURAL NERVE BIOPSY      FAMILY HISTORY: Family History  Problem Relation Age of Onset   Migraines Mother    Cancer Mother    Aneurysm Father    Parkinsonism Maternal Grandfather    Cancer Maternal Grandfather    Heart disease Maternal Grandfather    Heart disease Paternal Grandfather    Stroke Paternal Grandfather     SOCIAL HISTORY: Social History   Socioeconomic History   Marital status: Married    Spouse name: Ed   Number of  children: 0   Years of education: 16   Highest education level: Not on file  Occupational History   Occupation: Glass blower/designer  Tobacco Use   Smoking status: Never   Smokeless tobacco: Never  Substance and Sexual Activity   Alcohol use: Yes    Alcohol/week: 0.0 standard drinks of alcohol    Comment: occasionally   Drug use: No   Sexual activity: Not on file  Other Topics Concern   Not on file  Social History Narrative   Lives at home, married   Patient drinks about 2 cups of caffeine daily.   Patient is right handed.   Social Determinants of Health   Financial Resource Strain: Not on file  Food Insecurity: Not on file  Transportation Needs: Not  on file  Physical Activity: Not on file  Stress: Not on file  Social Connections: Not on file  Intimate Partner Violence: Not on file    PHYSICAL EXAM  Vitals:   11/29/22 1507  BP: 116/82  Pulse: 74  Weight: 157 lb (71.2 kg)  Height: 5\' 6"  (1.676 m)    Body mass index is 25.34 kg/m.  Generalized: Well developed, in no acute distress  Neurological examination  Mentation: Alert oriented to time, place, history taking. Follows all commands speech and language fluent Cranial nerve II-XII: Pupils were equal round reactive to light. Extraocular movements were full, visual field were full on confrontational test. Facial sensation and strength were normal. Head turning and shoulder shrug  were normal and symmetric. Motor: The motor testing reveals 5 over 5 strength of all 4 extremities. Good symmetric motor tone is noted throughout.  Sensory: Length dependent sensory deficit to soft touch bilateral lower extremities, decreased soft touch sensation to left lateral thigh Coordination: Cerebellar testing reveals good finger-nose-finger and heel-to-shin bilaterally.  Gait and station: Gait is normal. Tandem gait is normal.  Able to walk on heels and tiptoe Reflexes: Deep tendon reflexes are symmetric and normal bilaterally.    DIAGNOSTIC DATA (LABS, IMAGING, TESTING) - I reviewed patient records, labs, notes, testing and imaging myself where available.  Lab Results  Component Value Date   WBC 18.0 (H) 12/08/2021   HGB 13.6 12/08/2021   HCT 40.0 12/08/2021   MCV 97.2 12/08/2021   PLT 295 12/08/2021      Component Value Date/Time   NA 142 12/08/2021 0537   NA 139 06/14/2021 1044   K 3.8 12/08/2021 0537   CL 106 12/08/2021 0537   CO2 24 12/08/2021 0507   GLUCOSE 117 (H) 12/08/2021 0537   BUN 17 12/08/2021 0537   BUN 12 06/14/2021 1044   CREATININE 0.80 12/08/2021 0537   CALCIUM 8.3 (L) 12/08/2021 0507   PROT 6.3 (L) 12/08/2021 0507   PROT 7.5 06/14/2021 1044   ALBUMIN 3.5 12/08/2021 0507   ALBUMIN 4.9 06/14/2021 1044   AST 29 12/08/2021 0507   ALT 22 12/08/2021 0507   ALKPHOS 60 12/08/2021 0507   BILITOT 0.4 12/08/2021 0507   BILITOT 0.3 06/14/2021 1044   GFRNONAA >60 12/08/2021 0507   GFRAA 104 12/23/2019 1557   No results found for: "CHOL", "HDL", "LDLCALC", "LDLDIRECT", "TRIG", "CHOLHDL" No results found for: "HGBA1C" Lab Results  Component Value Date   [id number] 06/14/2021   Lab Results  Component Value Date   TSH 10.760 (H) 04/24/2013   Evangeline Dakin, DNP  [facility] [address], [place], [place] [address] [phone]

## 2022-11-29 ENCOUNTER — Encounter: Payer: Self-pay | Admitting: Neurology

## 2022-11-29 ENCOUNTER — Ambulatory Visit: Payer: 59 | Admitting: Neurology

## 2022-11-29 VITALS — BP 116/82 | HR 74 | Ht 66.0 in | Wt 157.0 lb

## 2022-11-29 DIAGNOSIS — G608 Other hereditary and idiopathic neuropathies: Secondary | ICD-10-CM

## 2022-11-29 DIAGNOSIS — G43009 Migraine without aura, not intractable, without status migrainosus: Secondary | ICD-10-CM | POA: Diagnosis not present

## 2022-11-29 DIAGNOSIS — G4761 Periodic limb movement disorder: Secondary | ICD-10-CM

## 2022-11-29 MED ORDER — GABAPENTIN 300 MG PO CAPS: ORAL_CAPSULE | ORAL | 3 refills | Status: DC

## 2022-11-29 MED ORDER — CARBAMAZEPINE ER 200 MG PO CP12: 200.0000 mg | ORAL_CAPSULE | Freq: Two times a day (BID) | ORAL | 3 refills | Status: DC

## 2022-11-29 NOTE — Patient Instructions (Addendum)
Try to cut back the carbamazepine to once daily, if no worsening symptoms, try to stop all together, continue the gabapentin. Call for any worsening symptoms.  Meds ordered this encounter  Medications   carbamazepine (CARBATROL) 200 MG 12 hr capsule    Sig: Take 1 capsule (200 mg total) by mouth 2 (two) times daily.    Dispense:  180 capsule    Refill:  3   gabapentin (NEURONTIN) 300 MG capsule    Sig: TAKE 1 CAPSULE BY MOUTH AT 5PM AND TAKE TAKE TWO CAPSULES BY MOUTH EVERY NIGHT AT BEDTIME    Dispense:  270 capsule    Refill:  3

## 2023-11-01 ENCOUNTER — Other Ambulatory Visit: Payer: Self-pay | Admitting: Obstetrics and Gynecology

## 2023-11-01 DIAGNOSIS — Z13820 Encounter for screening for osteoporosis: Secondary | ICD-10-CM

## 2023-11-27 ENCOUNTER — Other Ambulatory Visit: Payer: Self-pay | Admitting: Medical Genetics

## 2023-11-27 NOTE — Progress Notes (Unsigned)
 PATIENT: [name] DOB: Aug 19, 1961  REASON FOR VISIT: Follow up for peripheral neuropathy, migraine headache, periodic limb movement disorder HISTORY FROM: Patient PRIMARY NEUROLOGIST: Dr.Yan   ASSESSMENT AND PLAN 63 y.o. year old female   1.  Peripheral neuropathy 2.  Migraine headache 3.  Periodic limb movement disorder 4.  B12 deficiency on injection  -Overall stable, may consider reducing carbamazepine 12 hr capsule to once daily, if symptoms do not worsen, may try to stop altogether, unclear benefit? -Continue gabapentin up to 300 mg, 1 at 5 PM, 2 at bedtime -Has Imitrex if needed for migraine -PCP follows labs -Follow-up in 1 year or sooner if needed, PCP will not fill medications   Meds ordered this encounter  Medications   carbamazepine (CARBATROL) 200 MG 12 hr capsule    Sig: Take 1 capsule (200 mg total) by mouth 2 (two) times daily.    Dispense:  180 capsule    Refill:  3   gabapentin (NEURONTIN) 300 MG capsule    Sig: TAKE 1 CAPSULE BY MOUTH AT 5PM AND TAKE TAKE TWO CAPSULES BY MOUTH EVERY NIGHT AT BEDTIME    Dispense:  270 capsule    Refill:  3   SUMAtriptan (IMITREX) 100 MG tablet    Sig: Take 1 tablet (100 mg total) by mouth 2 (two) times daily as needed for up to 30 doses for migraine.    Dispense:  10 tablet    Refill:  2   HISTORY OF PRESENT ILLNESS: Today 11/28/23 Had a migraine last week, 1st in 8 months, took Imitrex tablet with good benefit. Uses shot for severe nausea. Pollen/allergies are triggers. Remains on carbamazepine twice daily, we talked about reducing, unclear benefit? Doesn't want to try without it. Remains on gabapentin 300 at 5 PM, 600 mg at bedtime. Neuropathy is under good control, it is what it is. Swelling in her feet, wears her compression stocking elevates her feet fine. Doing Zumba. Walks 3.5 miles a day. Gets B12 shots at home. Not diabetic PCP checked labs.  11/29/22 SS: In ER April 2023 for vasovagal syncope.  Neuropathy is about the same, has some numbness to left lateral thigh, about size of hand. Is not painful. Balance is okay. Has burning type pains to legs. Really not sure if carbamazepine/gabapentin is helping. Most bothersome at night. Right now takes carbamazepine 200 mg AM, sometimes doesn't taken PM dose, gabapentin 300 mg BID, can't function at work. Still on B12 shots once a month. Migraines are rare, every 6 months. Wakes up with them. Uses Imitrex tablet, will take shot if vomiting. PCP checked labs in Jan, reports were okay.   Update 11/23/2021 SS: [name] here today for follow-up.  At last visit carbamazepine level was 6.4, no anemia, ferritin was slightly increased at 209, B12 was 778. Takes Carbatrol 200 mg twice daily, 300 mg PM, 600 mg at bedtime. Hydrocodone was filled by Dr. Anne Hahn in October 2022. Rarely takes hydrocodone. Migraines are every 2-3 months, has Imitrex tablet and injection as needed, works great. Has RLS, in past Mirapex caused nightmares. Had colonoscopy, benign polyp. Her mom has colon cancer, having resection soon. Having thyroid checked tomorrow.   HISTORY  06/14/2021 Dr. Anne Hahn: [name] is a 63 year old right-handed white female with a history of a peripheral neuropathy.  The patient has a history of a low B12 level in the past, she is on B12 supplementation.  The patient continues to have discomfort throughout the day with her legs  with burning sensations and cold sensations.  She feels more discomfort when she is sitting.  She is on Carbatrol taking 200 mg twice daily, she is unable to tolerate gabapentin at any dose during the daytime, she will take a 300 mg capsule around 5 PM when she gets off work and then at that time.  The patient is having increased problems with periodic limb movement disorder, she is not resting well at night and is fatigued during the day.  She reports no severe issues with balance.  She has occasional headaches, she may only have 1 headache  every 3 to 4 months, she takes Imitrex and may use some hydrocodone as a backup medication if the Imitrex does not work.  She returns to the office today for an evaluation.   REVIEW OF SYSTEMS: Out of a complete 14 system review of symptoms, the patient complains only of the following symptoms, and all other reviewed systems are negative.  See HPI  ALLERGIES: Allergies  Allergen Reactions   Doxycycline Hyclate    Lyrica [Pregabalin]    Vivelle-Dot [Estradiol]     Adhesive on patch   Aleve [Naproxen Sodium]     swelling   Mirapex [Pramipexole] Other (See Comments)    Nightmares   Propylene Glycol     Other reaction(s): rashes   Adhesive  [Tape] Rash   Ibuprofen     HOME MEDICATIONS: Outpatient Medications Prior to Visit  Medication Sig Dispense Refill   carbamazepine (CARBATROL) 200 MG 12 hr capsule Take 1 capsule (200 mg total) by mouth 2 (two) times daily. 180 capsule 3   Cholecalciferol (VITAMIN D3) 2000 UNITS TABS Take 2,000 Units by mouth daily.     cyanocobalamin (,VITAMIN B-12,) 1000 MCG/ML injection INJECT 1 ML INTRAMUSCULARLY EVERY 30 DAYS AS DIRECTED 10 mL 1   Ferrous Sulfate (IRON) 325 (65 Fe) MG TABS Take 1 tablet by mouth every other day.     gabapentin (NEURONTIN) 300 MG capsule TAKE 1 CAPSULE BY MOUTH AT 5PM AND TAKE TAKE TWO CAPSULES BY MOUTH EVERY NIGHT AT BEDTIME 270 capsule 3   levothyroxine (SYNTHROID) 100 MCG tablet 100 mcg.     Pediatric Multivit-Minerals-C (MULTIVITAMIN GUMMIES CHILDRENS) CHEW Chew 1 tablet by mouth daily.     SUMAtriptan (IMITREX) 100 MG tablet Take 1 tablet (100 mg total) by mouth 2 (two) times daily as needed for up to 1 dose for migraine. 10 tablet 2   SUMAtriptan (IMITREX) 6 MG/0.5ML SOLN injection Inject 0.5 mLs (6 mg total) into the skin 2 (two) times daily as needed for migraine or headache. 5 mL 3   No facility-administered medications prior to visit.    PAST MEDICAL HISTORY: Past Medical History:  Diagnosis Date   Ankle  fracture    Ankle fracture, left    Glaucoma    narrow angle   Migraines    Myeloperoxidase deficiency (HCC)    Periodic limb movement disorder 04/08/2015   Peripheral neuropathy    Right   Raynaud disease 04/24/2013   Vitamin B12 deficiency     PAST SURGICAL HISTORY: Past Surgical History:  Procedure Laterality Date   dental implants     x3   gastrocsoleus biopsy     KNEE SURGERY Left    lip biopsy     SURAL NERVE BIOPSY      FAMILY HISTORY: Family History  Problem Relation Age of Onset   Migraines Mother    Cancer Mother    Aneurysm Father  Parkinsonism Maternal Grandfather    Cancer Maternal Grandfather    Heart disease Maternal Grandfather    Heart disease Paternal Grandfather    Stroke Paternal Grandfather     SOCIAL HISTORY: Social History   Socioeconomic History   Marital status: Married    Spouse name: Ed   Number of children: 0   Years of education: 16   Highest education level: Not on file  Occupational History   Occupation: Print production planner  Tobacco Use   Smoking status: Never   Smokeless tobacco: Never  Substance and Sexual Activity   Alcohol use: Yes    Alcohol/week: 0.0 standard drinks of alcohol    Comment: occasionally   Drug use: No   Sexual activity: Not on file  Other Topics Concern   Not on file  Social History Narrative   Lives at home, married   Patient drinks about 2 cups of caffeine daily.   Patient is right handed.   Social Drivers of Corporate investment banker Strain: Not on file  Food Insecurity: Not on file  Transportation Needs: Not on file  Physical Activity: Not on file  Stress: Not on file  Social Connections: Unknown (01/15/2022)   Received from [facility]   Social Network    Social Network: Not on file  Intimate Partner Violence: Not At Risk (09/03/2023)   Received from [facility]   HITS    Over the last 12 months how often did your partner physically hurt you?: Never    Over the last 12  months how often did your partner insult you or talk down to you?: Never    Over the last 12 months how often did your partner threaten you with physical harm?: Never    Over the last 12 months how often did your partner scream or curse at you?: Never    PHYSICAL EXAM  Vitals:   11/28/23 1425  BP: 132/82  Pulse: 70  Weight: 171 lb (77.6 kg)  Height: 5\' 6"  (1.676 m)   Body mass index is 27.6 kg/m.  Generalized: Well developed, in no acute distress  Neurological examination  Mentation: Alert oriented to time, place, history taking. Follows all commands speech and language fluent Cranial nerve II-XII: Pupils were equal round reactive to light. Extraocular movements were full, visual field were full on confrontational test. Facial sensation and strength were normal. Head turning and shoulder shrug  were normal and symmetric. Motor: The motor testing reveals 5 over 5 strength of all 4 extremities. Good symmetric motor tone is noted throughout.  Sensory: Length dependent sensory deficit to soft touch bilateral lower extremities Coordination: Cerebellar testing reveals good finger-nose-finger and heel-to-shin bilaterally.  Gait and station: Gait is normal. Tandem gait is normal.  Able to walk on heels and tiptoe Reflexes: Deep tendon reflexes are symmetric and normal bilaterally.   DIAGNOSTIC DATA (LABS, IMAGING, TESTING) - I reviewed patient records, labs, notes, testing and imaging myself where available.  Lab Results  Component Value Date   WBC 18.0 (H) 12/08/2021   HGB 13.6 12/08/2021   HCT 40.0 12/08/2021   MCV 97.2 12/08/2021   PLT 295 12/08/2021      Component Value Date/Time   NA 142 12/08/2021 0537   NA 139 06/14/2021 1044   K 3.8 12/08/2021 0537   CL 106 12/08/2021 0537   CO2 24 12/08/2021 0507   GLUCOSE 117 (H) 12/08/2021 0537   BUN 17 12/08/2021 0537   BUN 12 06/14/2021 1044  CREATININE 0.80 12/08/2021 0537   CALCIUM 8.3 (L) 12/08/2021 0507   PROT 6.3 (L)  12/08/2021 0507   PROT 7.5 06/14/2021 1044   ALBUMIN 3.5 12/08/2021 0507   ALBUMIN 4.9 06/14/2021 1044   AST 29 12/08/2021 0507   ALT 22 12/08/2021 0507   ALKPHOS 60 12/08/2021 0507   BILITOT 0.4 12/08/2021 0507   BILITOT 0.3 06/14/2021 1044   GFRNONAA >60 12/08/2021 0507   GFRAA 104 12/23/2019 1557   No results found for: "CHOL", "HDL", "LDLCALC", "LDLDIRECT", "TRIG", "CHOLHDL" No results found for: "HGBA1C" Lab Results  Component Value Date   VITAMINB12 778 06/14/2021   Lab Results  Component Value Date   TSH 10.760 (H) 04/24/2013   Otila Kluver, DNP  [facility] [address] [place], [place] [address] [phone]

## 2023-11-28 ENCOUNTER — Encounter: Payer: Self-pay | Admitting: Neurology

## 2023-11-28 ENCOUNTER — Ambulatory Visit: Payer: 59 | Admitting: Neurology

## 2023-11-28 VITALS — BP 132/82 | HR 70 | Ht 66.0 in | Wt 171.0 lb

## 2023-11-28 DIAGNOSIS — G43009 Migraine without aura, not intractable, without status migrainosus: Secondary | ICD-10-CM

## 2023-11-28 DIAGNOSIS — G609 Hereditary and idiopathic neuropathy, unspecified: Secondary | ICD-10-CM

## 2023-11-28 MED ORDER — CARBAMAZEPINE ER 200 MG PO CP12: 200.0000 mg | ORAL_CAPSULE | Freq: Two times a day (BID) | ORAL | 3 refills | Status: AC

## 2023-11-28 MED ORDER — GABAPENTIN 300 MG PO CAPS: ORAL_CAPSULE | ORAL | 3 refills | Status: AC

## 2023-11-28 MED ORDER — SUMATRIPTAN SUCCINATE 100 MG PO TABS: 100.0000 mg | ORAL_TABLET | Freq: Two times a day (BID) | ORAL | 2 refills | Status: AC | PRN

## 2023-11-28 NOTE — Patient Instructions (Signed)
 Try to reduce carbamazepine to once a day, continue gabapentin. Monitor symptoms.

## 2024-07-01 ENCOUNTER — Other Ambulatory Visit: Payer: Self-pay | Admitting: Medical Genetics

## 2024-07-01 DIAGNOSIS — Z006 Encounter for examination for normal comparison and control in clinical research program: Secondary | ICD-10-CM

## 2024-07-10 ENCOUNTER — Other Ambulatory Visit

## 2024-09-01 LAB — GENECONNECT MOLECULAR SCREEN: Genetic Analysis Overall Interpretation: NEGATIVE

## 2024-11-26 ENCOUNTER — Ambulatory Visit: Admitting: Neurology
